# Patient Record
Sex: Female | Born: 1945 | Race: Black or African American | Hispanic: No | Marital: Married | Smoking: Former smoker
Health system: Southern US, Community
[De-identification: ages and names within clinical notes are randomized; demographics above are authoritative.]

## PROBLEM LIST (undated history)

## (undated) DIAGNOSIS — R51 Headache: Secondary | ICD-10-CM

## (undated) DIAGNOSIS — Z87440 Personal history of urinary (tract) infections: Secondary | ICD-10-CM

## (undated) DIAGNOSIS — G4733 Obstructive sleep apnea (adult) (pediatric): Secondary | ICD-10-CM

## (undated) DIAGNOSIS — M1990 Unspecified osteoarthritis, unspecified site: Secondary | ICD-10-CM

## (undated) DIAGNOSIS — K219 Gastro-esophageal reflux disease without esophagitis: Secondary | ICD-10-CM

## (undated) DIAGNOSIS — I10 Essential (primary) hypertension: Secondary | ICD-10-CM

## (undated) DIAGNOSIS — Z8619 Personal history of other infectious and parasitic diseases: Secondary | ICD-10-CM

## (undated) DIAGNOSIS — B379 Candidiasis, unspecified: Secondary | ICD-10-CM

## (undated) HISTORY — DX: Unspecified osteoarthritis, unspecified site: M19.90

## (undated) HISTORY — PX: CARPAL TUNNEL RELEASE: SHX101

## (undated) HISTORY — PX: TOTAL SHOULDER ARTHROPLASTY: SHX126

## (undated) HISTORY — DX: Gastro-esophageal reflux disease without esophagitis: K21.9

## (undated) HISTORY — DX: Personal history of other infectious and parasitic diseases: Z86.19

## (undated) HISTORY — DX: Headache: R51

## (undated) HISTORY — DX: Obstructive sleep apnea (adult) (pediatric): G47.33

## (undated) HISTORY — DX: Personal history of urinary (tract) infections: Z87.440

## (undated) HISTORY — PX: BUNIONECTOMY: SHX129

## (undated) HISTORY — DX: Candidiasis, unspecified: B37.9

## (undated) HISTORY — DX: Essential (primary) hypertension: I10

## (undated) NOTE — *Deleted (*Deleted)
Initial Nutrition Assessment  DOCUMENTATION CODES:   Obesity unspecified  INTERVENTION:  Recommend diet liberalization  Ensure Enlive po BID, each supplement provides 350 kcal and 20 grams of protein  MVI daily   NUTRITION DIAGNOSIS:   Increased nutrient needs related to catabolic illness (COVID-19) as evidenced by estimated needs.    GOAL:   Patient will meet greater than or equal to 90% of their needs    MONITOR:   PO intake, Supplement acceptance, Labs, Weight trends, I & O's  REASON FOR ASSESSMENT:   LOS    ASSESSMENT:   Pt with a PMH significant for asthma, HTN, OSA, hypothyroidism and L thyroid nodule admitted with acute hypoxemic respiratory failure due to COVID-19.  Reviewed wt history. No significant wt changes noted.   PO Intake: 50% x 1 recorded meal  Labs: Corrected Ca 12.2 (H) Medications: vitamin c, vitamin D3, solu-medrol, protonix, zinc sulfate  Diet Order:   Diet Order            Diet Heart Room service appropriate? Yes; Fluid consistency: Thin  Diet effective now                 EDUCATION NEEDS:   Not appropriate for education at this time  Skin:  Skin Assessment: Reviewed RN Assessment  Last BM:  10/17 type 3  Height:   Ht Readings from Last 1 Encounters:  03/24/20 5\' 3"  (1.6 m)    Weight:   Wt Readings from Last 1 Encounters:  03/24/20 102.1 kg    Ideal Body Weight:     BMI:  Body mass index is 39.86 kg/m.  Estimated Nutritional Needs:   Kcal:  2150-2350  Protein:  115-135 grams  Fluid:  >2L/d    ***

---

## 2010-10-09 ENCOUNTER — Encounter: Payer: Self-pay | Admitting: Internal Medicine

## 2010-10-10 ENCOUNTER — Encounter: Payer: Self-pay | Admitting: Internal Medicine

## 2010-10-10 ENCOUNTER — Ambulatory Visit (INDEPENDENT_AMBULATORY_CARE_PROVIDER_SITE_OTHER): Payer: Medicare Other | Admitting: Internal Medicine

## 2010-10-10 VITALS — BP 136/84 | HR 63 | Temp 98.1°F | Ht 66.0 in | Wt 242.8 lb

## 2010-10-10 DIAGNOSIS — R0600 Dyspnea, unspecified: Secondary | ICD-10-CM

## 2010-10-10 DIAGNOSIS — R0609 Other forms of dyspnea: Secondary | ICD-10-CM

## 2010-10-10 NOTE — Progress Notes (Signed)
  Subjective:    Patient ID: Amanda Ross, female    DOB: 06-Apr-1946, 65 y.o.   MRN: [id number]  HPI  Primary = [name].  65 yowf quit smoking 2007 no resp problems or need for medication while living in [place] @ wt 200lb   10/10/2010 ov / Wert  Initial pulmonary office eval cc sob since 2008 daily doe x 2 blocks and also feels lump in throat when lies down and some better when lying on side assoc with chronic nasal congestion but no purulent secretions. Pt denies any significant sore throat, dysphagia, itching, sneezing,  nasal congestion or excess/ purulent secretions,  fever, chills, sweats, unintended wt loss, pleuritic or exertional cp, hempoptysis, orthopnea pnd or leg swelling.    Also denies any obvious fluctuation of symptoms with weather or environmental changes or other aggravating or alleviating factors.  No better on symbicort 80    Review of Systems  Constitutional: Negative for fever, chills and unexpected weight change.  HENT: Positive for congestion, trouble swallowing and dental problem. Negative for ear pain, nosebleeds, sore throat, rhinorrhea, sneezing, voice change, postnasal drip and sinus pressure.   Eyes: Negative for visual disturbance.  Respiratory: Positive for shortness of breath. Negative for cough and choking.   Cardiovascular: Positive for leg swelling. Negative for chest pain.  Gastrointestinal: Negative for vomiting, abdominal pain and diarrhea.  Genitourinary: Negative for difficulty urinating.  Musculoskeletal: Negative for arthralgias.  Skin: Negative for rash.  Neurological: Negative for tremors, syncope and headaches.  Hematological: Does not bruise/bleed easily.       Objective:   Physical Exam Obese bf mild hoarse nad  Wt  243 10/10/2010  HEENT mild turbinate edema.  Oropharynx no thrush or excess pnd or cobblestoning.  No JVD or cervical adenopathy. Mild accessory muscle hypertrophy. Trachea midline, nl thryroid. Chest was hyperinflated by  percussion with diminished breath sounds and moderate increased exp time without wheeze. Hoover sign positive at mid inspiration. Regular rate and rhythm without murmur gallop or rub or increase P2 or edema.  Abd: no hsm, nl excursion. Ext warm without cyanosis or clubbing.         Assessment & Plan:

## 2010-10-10 NOTE — Patient Instructions (Signed)
Work on Musician technique:  relax and gently blow all the way out then take a nice smooth deep breath back in, triggering the inhaler at same time you start breathing in.  Hold for up to 5 seconds if you can.  Rinse and gargle with water when done   If your mouth or throat starts to bother you,   I suggest you time the inhaler to your dental care and after using the inhaler(s) brush teeth and tongue with a baking soda containing toothpaste and when you rinse this out, gargle with it first to see if this helps your mouth and throat.     Prilosec 20mg  Take 30-60 min before first meal of the day and Pepcid 20 mg one at bedtime   GERD (REFLUX)  is an extremely common cause of respiratory symptoms, many times with no significant heartburn at all.    It can be treated with medication, but also with lifestyle changes including avoidance of late meals, excessive alcohol, smoking cessation, and avoid fatty foods, chocolate, peppermint, colas, red wine, and acidic juices such as orange juice.  NO MINT OR MENTHOL PRODUCTS SO NO COUGH DROPS  USE SUGARLESS CANDY INSTEAD (jolley ranchers or Stover's)  NO OIL BASED VITAMINS   Please schedule a follow up office visit in 4 weeks, sooner if needed with PFt's on return

## 2010-10-11 ENCOUNTER — Encounter: Payer: Self-pay | Admitting: Internal Medicine

## 2010-10-11 DIAGNOSIS — R0600 Dyspnea, unspecified: Secondary | ICD-10-CM | POA: Insufficient documentation

## 2010-10-11 NOTE — Assessment & Plan Note (Signed)
Not clear this is an airway problem but if it is airway related it fits the criteria for difficult to control  DDX of  difficult airways managment all start with A and  include Adherence, Ace Inhibitors, Acid Reflux, Active Sinus Disease, Alpha 1 Antitripsin deficiency, Anxiety masquerading as Airways dz,  ABPA,  allergy(esp in young), Aspiration (esp in elderly), Adverse effects of DPI,  Active smokers, plus two Bs  = Bronchiectasis and Beta blocker use..and one C= CHF   Adherence here relates to ability to use hfa effectively in case this is asthma. The proper method of use, as well as anticipated side effects, of this metered-dose inhaler are discussed and demonstrated to the patient.  Improved to 50% with coaching  ? Acid reflux related > empiric rx pending pft's on return

## 2010-11-09 ENCOUNTER — Ambulatory Visit: Payer: Medicare Other | Admitting: Internal Medicine

## 2010-12-06 ENCOUNTER — Ambulatory Visit: Payer: Medicare Other | Admitting: Internal Medicine

## 2011-01-04 NOTE — Progress Notes (Unsigned)
  Subjective:    Patient ID: Amanda Ross, female    DOB: Dec 26, 1945, 65 y.o.   MRN: [id number]  HPI  Primary = [name].  65 yowf quit smoking 2007 no resp problems or need for medication while living in [place] @ wt 200lb   10/10/2010 ov / Wert  Initial pulmonary office eval cc sob since 2008 daily doe x 2 blocks and also feels lump in throat when lies down and some better when lying on side assoc with chronic nasal congestion but no purulent secretions. Pt denies any significant sore throat, dysphagia, itching, sneezing,  nasal congestion or excess/ purulent secretions,  fever, chills, sweats, unintended wt loss, pleuritic or exertional cp, hempoptysis, orthopnea pnd or leg swelling.    Also denies any obvious fluctuation of symptoms with weather or environmental changes or other aggravating or alleviating factors.  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  Oropharynx no thrush or excess pnd or cobblestoning.  No JVD or cervical adenopathy. Mild accessory muscle hypertrophy. Trachea midline, nl thryroid. Chest was hyperinflated by percussion with diminished breath sounds and moderate increased exp time without wheeze. Hoover sign positive at mid inspiration. Regular rate and rhythm without murmur gallop or rub or increase P2 or edema.  Abd: no hsm, nl excursion. Ext warm without cyanosis or clubbing.         Assessment & Plan:

## 2011-01-05 ENCOUNTER — Ambulatory Visit: Payer: Medicare Other | Admitting: Internal Medicine

## 2011-01-18 ENCOUNTER — Encounter: Payer: Self-pay | Admitting: Podiatry

## 2011-01-31 ENCOUNTER — Ambulatory Visit (INDEPENDENT_AMBULATORY_CARE_PROVIDER_SITE_OTHER): Payer: Medicare Other | Admitting: Internal Medicine

## 2011-01-31 ENCOUNTER — Encounter: Payer: Self-pay | Admitting: Internal Medicine

## 2011-01-31 VITALS — BP 104/80 | HR 77 | Temp 97.9°F | Ht 65.0 in | Wt 242.0 lb

## 2011-01-31 DIAGNOSIS — R0989 Other specified symptoms and signs involving the circulatory and respiratory systems: Secondary | ICD-10-CM

## 2011-01-31 DIAGNOSIS — R0600 Dyspnea, unspecified: Secondary | ICD-10-CM

## 2011-01-31 LAB — PULMONARY FUNCTION TEST

## 2011-01-31 MED ORDER — FAMOTIDINE 20 MG PO TABS: ORAL_TABLET | ORAL | Status: DC

## 2011-01-31 NOTE — Patient Instructions (Signed)
Prilosec 20 mg Take 30-60 min before first meal of the day and Pepcid 20 mg on at bedtime  GERD (REFLUX)  is an extremely common cause of respiratory symptoms, many times with no significant heartburn at all.    It can be treated with medication, but also with lifestyle changes including avoidance of late meals, excessive alcohol, smoking cessation, and avoid fatty foods, chocolate, peppermint, colas, red wine, and acidic juices such as orange juice.  NO MINT OR MENTHOL PRODUCTS SO NO COUGH DROPS  USE SUGARLESS CANDY INSTEAD (jolley ranchers or Stover's)  NO OIL BASED VITAMINS  You do not have lung damage from smoking,  So it's ok just use the symbicort 2 puffs every 12 hours if needed  Your inhaler techique is much better!   If you are satisfied with your treatment plan let your doctor know and he/she can either refill your medications or you can return here when your prescription runs out.     If in any way you are not 100% satisfied,  please tell us.  If 100% better, tell your friends!

## 2011-01-31 NOTE — Progress Notes (Signed)
PFT done today. 

## 2011-01-31 NOTE — Progress Notes (Signed)
  Subjective:    Patient ID: Amanda Ross, female    DOB: 1946/05/23, 65 y.o.   MRN: [id number]  HPI  Primary = Amanda Ross.  65 yowf quit smoking 2007 no resp problems or need for medication while living in [place] @ wt 200lb   10/10/2010 ov / Adessa Primiano  Initial pulmonary office eval cc sob since 2008 daily doe x 2 blocks and also feels lump in throat when lies down and some better when lying on side assoc with chronic nasal congestion but no purulent secretions.  No better on symbicort 80  - imp ? 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  Sleeping ok without nocturnal  or early am exac of resp c/o's or need for noct saba. Pt denies any significant sore throat, dysphagia, itching, sneezing,  nasal congestion or excess/ purulent secretions,  fever, chills, sweats, unintended wt loss, pleuritic or exertional cp, hempoptysis, orthopnea pnd or leg swelling.    Also denies any obvious fluctuation of symptoms with weather or environmental changes or other aggravating or alleviating factors.         Objective:   Physical Exam Obese bf mild hoarse nad  Wt  243 10/10/2010 > 242 01/31/2011  HEENT mild turbinate edema.  Oropharynx no thrush or excess pnd or cobblestoning.  No JVD or cervical adenopathy. Mild accessory muscle hypertrophy. Trachea midline, nl thryroid. Chest was hyperinflated by percussion with diminished breath sounds and moderate increased exp time without wheeze. Hoover sign positive at mid inspiration. Regular rate and rhythm without murmur gallop or rub or increase P2 or edema.  Abd: no hsm, nl excursion. Ext warm without cyanosis or clubbing.         Assessment & Plan:

## 2011-01-31 NOTE — Assessment & Plan Note (Signed)
Much better on symbicort 80 though has some atypical features and no significant airflow obstruction on pft's  ? Component of reflux? See instructions for specific recommendations which were reviewed directly with the patient who was given a copy with highlighter outlining the key components.   The proper method of use, as well as anticipated side effects, of this metered-dose inhaler are discussed and demonstrated to the patient. Improved to 90% with coaching.   See instructions for specific recommendations which were reviewed directly with the patient who was given a copy with highlighter outlining the key components.

## 2011-02-08 ENCOUNTER — Encounter: Payer: Self-pay | Admitting: Internal Medicine

## 2011-04-10 ENCOUNTER — Other Ambulatory Visit: Payer: Self-pay | Admitting: Internal Medicine

## 2011-04-10 DIAGNOSIS — Z1231 Encounter for screening mammogram for malignant neoplasm of breast: Secondary | ICD-10-CM

## 2011-04-26 LAB — HM COLONOSCOPY

## 2011-05-08 ENCOUNTER — Ambulatory Visit: Payer: Medicare Other

## 2011-06-08 ENCOUNTER — Other Ambulatory Visit (HOSPITAL_BASED_OUTPATIENT_CLINIC_OR_DEPARTMENT_OTHER): Payer: Self-pay | Admitting: Rheumatology

## 2011-06-08 DIAGNOSIS — M542 Cervicalgia: Secondary | ICD-10-CM

## 2011-06-25 DIAGNOSIS — J3489 Other specified disorders of nose and nasal sinuses: Secondary | ICD-10-CM | POA: Diagnosis not present

## 2011-06-25 DIAGNOSIS — J0110 Acute frontal sinusitis, unspecified: Secondary | ICD-10-CM | POA: Diagnosis not present

## 2011-06-26 ENCOUNTER — Ambulatory Visit (HOSPITAL_BASED_OUTPATIENT_CLINIC_OR_DEPARTMENT_OTHER)
Admission: RE | Admit: 2011-06-26 | Discharge: 2011-06-26 | Disposition: A | Discharge status: Home / Self Care | Payer: Medicare Other | Source: Ambulatory Visit | Attending: Rheumatology | Admitting: Rheumatology

## 2011-06-26 DIAGNOSIS — M542 Cervicalgia: Secondary | ICD-10-CM | POA: Insufficient documentation

## 2011-06-26 DIAGNOSIS — R209 Unspecified disturbances of skin sensation: Secondary | ICD-10-CM | POA: Insufficient documentation

## 2011-06-26 DIAGNOSIS — M47812 Spondylosis without myelopathy or radiculopathy, cervical region: Secondary | ICD-10-CM | POA: Insufficient documentation

## 2011-06-26 DIAGNOSIS — M5020 Other cervical disc displacement, unspecified cervical region: Secondary | ICD-10-CM | POA: Insufficient documentation

## 2011-06-26 DIAGNOSIS — M4800 Spinal stenosis, site unspecified: Secondary | ICD-10-CM | POA: Diagnosis not present

## 2011-06-26 DIAGNOSIS — M5030 Other cervical disc degeneration, unspecified cervical region: Secondary | ICD-10-CM | POA: Insufficient documentation

## 2011-06-26 DIAGNOSIS — E0789 Other specified disorders of thyroid: Secondary | ICD-10-CM | POA: Diagnosis not present

## 2011-06-26 DIAGNOSIS — R51 Headache: Secondary | ICD-10-CM | POA: Insufficient documentation

## 2011-08-09 DIAGNOSIS — Z79899 Other long term (current) drug therapy: Secondary | ICD-10-CM | POA: Diagnosis not present

## 2011-08-09 DIAGNOSIS — R21 Rash and other nonspecific skin eruption: Secondary | ICD-10-CM | POA: Diagnosis not present

## 2011-08-09 DIAGNOSIS — I10 Essential (primary) hypertension: Secondary | ICD-10-CM | POA: Diagnosis not present

## 2011-08-09 DIAGNOSIS — N951 Menopausal and female climacteric states: Secondary | ICD-10-CM | POA: Diagnosis not present

## 2011-09-26 DIAGNOSIS — R197 Diarrhea, unspecified: Secondary | ICD-10-CM | POA: Diagnosis not present

## 2011-09-26 DIAGNOSIS — K5289 Other specified noninfective gastroenteritis and colitis: Secondary | ICD-10-CM | POA: Diagnosis not present

## 2011-09-26 DIAGNOSIS — K521 Toxic gastroenteritis and colitis: Secondary | ICD-10-CM | POA: Diagnosis not present

## 2011-09-26 DIAGNOSIS — I10 Essential (primary) hypertension: Secondary | ICD-10-CM | POA: Diagnosis not present

## 2011-09-26 DIAGNOSIS — Z79899 Other long term (current) drug therapy: Secondary | ICD-10-CM | POA: Diagnosis not present

## 2011-10-09 DIAGNOSIS — M76899 Other specified enthesopathies of unspecified lower limb, excluding foot: Secondary | ICD-10-CM | POA: Diagnosis not present

## 2011-10-09 DIAGNOSIS — M1710 Unilateral primary osteoarthritis, unspecified knee: Secondary | ICD-10-CM | POA: Diagnosis not present

## 2011-10-09 DIAGNOSIS — M542 Cervicalgia: Secondary | ICD-10-CM | POA: Diagnosis not present

## 2011-10-16 DIAGNOSIS — R5381 Other malaise: Secondary | ICD-10-CM | POA: Diagnosis not present

## 2011-10-16 DIAGNOSIS — E041 Nontoxic single thyroid nodule: Secondary | ICD-10-CM | POA: Diagnosis not present

## 2011-10-17 ENCOUNTER — Other Ambulatory Visit: Payer: Self-pay | Admitting: Endocrinology

## 2011-10-17 DIAGNOSIS — E041 Nontoxic single thyroid nodule: Secondary | ICD-10-CM

## 2011-11-19 ENCOUNTER — Encounter: Payer: Self-pay | Admitting: Obstetrics and Gynecology

## 2011-12-12 ENCOUNTER — Emergency Department (HOSPITAL_COMMUNITY)
Admission: EM | Admit: 2011-12-12 | Discharge: 2011-12-12 | Disposition: A | Discharge status: Home / Self Care | Payer: Medicare Other | Attending: Emergency Medicine | Admitting: Emergency Medicine

## 2011-12-12 ENCOUNTER — Encounter (HOSPITAL_COMMUNITY): Payer: Self-pay | Admitting: *Deleted

## 2011-12-12 DIAGNOSIS — Z87891 Personal history of nicotine dependence: Secondary | ICD-10-CM | POA: Diagnosis not present

## 2011-12-12 DIAGNOSIS — K219 Gastro-esophageal reflux disease without esophagitis: Secondary | ICD-10-CM | POA: Insufficient documentation

## 2011-12-12 DIAGNOSIS — I10 Essential (primary) hypertension: Secondary | ICD-10-CM | POA: Insufficient documentation

## 2011-12-12 DIAGNOSIS — L259 Unspecified contact dermatitis, unspecified cause: Secondary | ICD-10-CM | POA: Insufficient documentation

## 2011-12-12 DIAGNOSIS — G4733 Obstructive sleep apnea (adult) (pediatric): Secondary | ICD-10-CM | POA: Diagnosis not present

## 2011-12-12 MED ORDER — TRIAMCINOLONE ACETONIDE 0.1 % EX CREA: TOPICAL_CREAM | Freq: Two times a day (BID) | CUTANEOUS | Status: AC

## 2011-12-12 NOTE — ED Notes (Signed)
PA, Tia informed of elevated BP/HR-pt has not taken am meds, will do so when she gets home and recheck BP as well as following up with PCP

## 2011-12-12 NOTE — ED Provider Notes (Signed)
Medical screening examination/treatment/procedure(s) were performed by non-physician practitioner and as supervising physician I was immediately available for consultation/collaboration.  Flint Melter, MD 12/12/11 [phone]

## 2011-12-12 NOTE — ED Notes (Signed)
Pt presents with rash and neck red and raised, itchy

## 2011-12-12 NOTE — ED Provider Notes (Signed)
History     CSN: [id number]  Arrival date & time 12/12/11  0803   First MD Initiated Contact with Patient 12/12/11 [phone]      Chief Complaint  Patient presents with  . Rash    (Consider location/radiation/quality/duration/timing/severity/associated sxs/prior treatment) Patient is a 66 y.o. female presenting with rash. The history is provided by the patient. No language interpreter was used.  Rash  This is a recurrent problem. The current episode started more than 2 days ago. The problem has been gradually worsening. 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 There has been no fever. The rash is present on the face, right arm, right wrist, right hand, left wrist, left arm and left hand. Associated symptoms include itching. She has tried antihistamines for the symptoms. The treatment provided mild relief. Risk factors include new environmental exposures.  Patient states that she has had symptoms like this before 2 years ago, and visited a physician who could not identify the rash, but gave her a cream (name unknown) with gradual resolution.Denies constitutional symptoms. Denies NVD.Denies changes in lotion, soap, detergent, new medications, new food, or contacts with similar symptoms. Reports a transient sore throat for 2-3 days that resolved spontaneously.  Past Medical History  Diagnosis Date  . GERD (gastroesophageal reflux disease)   . Hypertension   . Asthma   . OSA (obstructive sleep apnea)     Past Surgical History  Procedure Date  . Bunionectomy     Family History  Problem Relation Age of Onset  . Asthma Daughter   . Heart disease Mother     CHF  . Heart disease Brother   . Deep vein thrombosis Mother   . Rheum arthritis Mother   . Rheum arthritis Sister     History  Substance Use Topics  . Smoking status: Former Smoker -- 1.0 packs/day for 40 years    Types: Cigarettes    Quit date: 06/18/2005  . Smokeless tobacco: Never  Used  . Alcohol Use: No    OB History    Grav Para Term Preterm Abortions TAB SAB Ect Mult Living                  Review of Systems  Constitutional: Negative for fever and chills.  Skin: Positive for itching and rash.    Allergies  Penicillins  Home Medications   Current Outpatient Rx  Name Route Sig Dispense Refill  . AMLODIPINE BESYLATE 5 MG PO TABS Oral Take 5 mg by mouth daily.     Marland Kitchen VITAMIN C 500 MG PO TABS Oral Take 500 mg by mouth daily.     . ASPIRIN 81 MG PO TABS Oral Take 81 mg by mouth daily.    Marland Kitchen VITAMIN D 1000 UNITS PO CAPS Oral Take 1,000 Units by mouth daily.     . B-12 2500 MCG PO TABS Oral Take 2,500 mcg by mouth daily.    Marland Kitchen LORATADINE 10 MG PO TABS Oral Take 10 mg by mouth daily.    Marland Kitchen MAGNESIUM GLUCONATE 500 MG PO TABS Oral Take 500 mg by mouth daily.    Marland Kitchen METAXALONE 800 MG PO TABS Oral Take 800 mg by mouth every 8 (eight) hours as needed. Muscle spasms    . METOPROLOL TARTRATE 25 MG PO TABS Oral Take 12.5 mg by mouth 2 (two) times daily.     Marland Kitchen OMEPRAZOLE 20 MG PO CPDR Oral Take 20 mg by mouth daily.  BP 155/77  Pulse 86  Temp 98.1 F (36.7 C) (Oral)  Resp 24  SpO2 94%  Physical Exam  Constitutional: She appears well-developed and well-nourished.  HENT:  Head: Normocephalic and atraumatic.       No oral mucosal involvement.  Neck: Neck supple.  Cardiovascular: Normal rate, regular rhythm and normal heart sounds.   Pulmonary/Chest: Effort normal and breath sounds normal.  Lymphadenopathy:    She has cervical adenopathy.  Skin: Skin is warm. Rash noted.       ED Course  Procedures (including critical care time)   Labs Reviewed - No data to display No results found.   1. Contact dermatitis       MDM   Patient seen and examined, and found in no acute distress. Rash seems to be consistent with contact dermatitis. Patient does not have any respiratory distress, angioedema, fever, mucosal involvement, or nuchal  rigidity.       Pixie Casino, PA-C 12/12/11 1006

## 2011-12-12 NOTE — Discharge Instructions (Signed)
Apply cream 2 times daily. Watch for symptoms worsening, fever, shortness of breath, swelling of lips, tongue, throat. Follow up with your doctor as needed or if not feeling better. Contact Dermatitis Contact dermatitis is a rash that happens when something touches the skin. You touched something that irritates your skin, or you have allergies to something you touched. HOME CARE   Avoid the thing that caused your rash.   Keep your rash away from hot water, soap, sunlight, chemicals, and other things that might bother it.   Do not scratch your rash.   You can take cool baths to help stop itching.   Only take medicine as told by your doctor.   Keep all doctor visits as told.  GET HELP RIGHT AWAY IF:   Your rash is not better after 3 days.   Your rash gets worse.   Your rash is puffy (swollen), tender, red, sore, or warm.   You have problems with your medicine.  MAKE SURE YOU:   Understand these instructions.   Will watch your condition.   Will get help right away if you are not doing well or get worse.  Document Released: 04/01/2009 Document Revised: 05/24/2011 Document Reviewed: 11/07/2010 [facility] Patient Information 2012 [place], [place].

## 2011-12-28 ENCOUNTER — Ambulatory Visit (INDEPENDENT_AMBULATORY_CARE_PROVIDER_SITE_OTHER): Payer: Medicare Other | Admitting: Obstetrics and Gynecology

## 2011-12-28 ENCOUNTER — Encounter: Payer: Self-pay | Admitting: Obstetrics and Gynecology

## 2011-12-28 VITALS — Resp 14 | Ht 64.0 in | Wt 245.0 lb

## 2011-12-28 DIAGNOSIS — R32 Unspecified urinary incontinence: Secondary | ICD-10-CM | POA: Diagnosis not present

## 2011-12-28 DIAGNOSIS — Z124 Encounter for screening for malignant neoplasm of cervix: Secondary | ICD-10-CM | POA: Diagnosis not present

## 2011-12-28 DIAGNOSIS — R87610 Atypical squamous cells of undetermined significance on cytologic smear of cervix (ASC-US): Secondary | ICD-10-CM | POA: Diagnosis not present

## 2011-12-28 DIAGNOSIS — N898 Other specified noninflammatory disorders of vagina: Secondary | ICD-10-CM | POA: Diagnosis not present

## 2011-12-28 LAB — POCT WET PREP (WET MOUNT)
Bacteria Wet Prep HPF POC: NEGATIVE
pH: 5

## 2011-12-28 NOTE — Progress Notes (Signed)
  Last Pap: several years ago WNL: Yes Regular Periods:no Contraception: PM  Monthly Breast exam:no Tetanus<75yrs:yes Nl.Bladder Function:yes Daily BMs:yes Healthy Diet:yes Calcium:yes Mammogram:yes Date of Mammogram: 2009 Exercise:no Have often Exercise:occ Seatbelt: yes Abuse at home: no Stressful work:no Sigmoid-colonoscopy: 1-2 yrs ago Bone Density: Yes 4-5 yrs ago PCP: Dr. Allyne Gee Change in PMH: new Change in FMH:new  C/o incontinence (mixed).    Filed Vitals:   12/28/11 1553  Resp: 14   ROS: noncontributory  Physical Examination: General appearance - alert, well appearing, and in no distress Neck - supple, no significant adenopathy Chest - clear to auscultation, no wheezes, rales or rhonchi, symmetric air entry Heart - normal rate and regular rhythm Abdomen - soft, nontender, nondistended, no masses or organomegaly Breasts - breasts appear normal, no suspicious masses, no skin or nipple changes or axillary nodes Pelvic - normal external genitalia, vulva, vagina, cervix, uterus and adnexa Back exam - no CVAT Extremities - no edema, redness or tenderness in the calves or thighs  A/P Urine Cx Mammo Sched bladder testing

## 2011-12-30 LAB — URINE CULTURE: Colony Count: 50000

## 2012-01-01 LAB — PAP IG W/ RFLX HPV ASCU

## 2012-01-08 ENCOUNTER — Encounter: Payer: Self-pay | Admitting: Obstetrics and Gynecology

## 2012-01-11 DIAGNOSIS — Z6841 Body Mass Index (BMI) 40.0 and over, adult: Secondary | ICD-10-CM | POA: Diagnosis not present

## 2012-01-11 DIAGNOSIS — R609 Edema, unspecified: Secondary | ICD-10-CM | POA: Diagnosis not present

## 2012-01-11 DIAGNOSIS — Z79899 Other long term (current) drug therapy: Secondary | ICD-10-CM | POA: Diagnosis not present

## 2012-01-23 ENCOUNTER — Ambulatory Visit: Payer: Medicare Other | Admitting: Obstetrics and Gynecology

## 2012-01-23 ENCOUNTER — Encounter: Payer: Medicare Other | Admitting: Obstetrics and Gynecology

## 2012-01-23 NOTE — Progress Notes (Signed)
Pt left without being seen.  Has f/u/ repeat pap appt in about a week.  Will review Lumax results at that visit.

## 2012-02-04 DIAGNOSIS — R609 Edema, unspecified: Secondary | ICD-10-CM | POA: Diagnosis not present

## 2012-02-04 DIAGNOSIS — J449 Chronic obstructive pulmonary disease, unspecified: Secondary | ICD-10-CM | POA: Diagnosis not present

## 2012-02-04 DIAGNOSIS — I10 Essential (primary) hypertension: Secondary | ICD-10-CM | POA: Diagnosis not present

## 2012-02-06 DIAGNOSIS — Z79899 Other long term (current) drug therapy: Secondary | ICD-10-CM | POA: Diagnosis not present

## 2012-02-06 DIAGNOSIS — E782 Mixed hyperlipidemia: Secondary | ICD-10-CM | POA: Diagnosis not present

## 2012-02-11 DIAGNOSIS — R0602 Shortness of breath: Secondary | ICD-10-CM | POA: Diagnosis not present

## 2012-02-11 DIAGNOSIS — J449 Chronic obstructive pulmonary disease, unspecified: Secondary | ICD-10-CM | POA: Diagnosis not present

## 2012-02-13 DIAGNOSIS — M7989 Other specified soft tissue disorders: Secondary | ICD-10-CM | POA: Diagnosis not present

## 2012-02-13 DIAGNOSIS — M79609 Pain in unspecified limb: Secondary | ICD-10-CM | POA: Diagnosis not present

## 2012-02-21 ENCOUNTER — Ambulatory Visit (INDEPENDENT_AMBULATORY_CARE_PROVIDER_SITE_OTHER): Payer: Medicare Other | Admitting: Obstetrics and Gynecology

## 2012-02-21 VITALS — Ht 64.0 in | Wt 247.0 lb

## 2012-02-21 DIAGNOSIS — R87615 Unsatisfactory cytologic smear of cervix: Secondary | ICD-10-CM

## 2012-02-21 DIAGNOSIS — Z124 Encounter for screening for malignant neoplasm of cervix: Secondary | ICD-10-CM

## 2012-03-04 NOTE — Progress Notes (Signed)
Patient was scheduled in error and not seen.

## 2012-03-11 DIAGNOSIS — M1710 Unilateral primary osteoarthritis, unspecified knee: Secondary | ICD-10-CM | POA: Diagnosis not present

## 2012-03-11 DIAGNOSIS — M542 Cervicalgia: Secondary | ICD-10-CM | POA: Diagnosis not present

## 2012-03-11 DIAGNOSIS — M76899 Other specified enthesopathies of unspecified lower limb, excluding foot: Secondary | ICD-10-CM | POA: Diagnosis not present

## 2012-03-31 DIAGNOSIS — Z23 Encounter for immunization: Secondary | ICD-10-CM | POA: Diagnosis not present

## 2012-03-31 DIAGNOSIS — Z79899 Other long term (current) drug therapy: Secondary | ICD-10-CM | POA: Diagnosis not present

## 2012-04-01 DIAGNOSIS — G5600 Carpal tunnel syndrome, unspecified upper limb: Secondary | ICD-10-CM | POA: Diagnosis not present

## 2012-04-02 DIAGNOSIS — N183 Chronic kidney disease, stage 3 unspecified: Secondary | ICD-10-CM | POA: Diagnosis not present

## 2012-04-02 DIAGNOSIS — Z0000 Encounter for general adult medical examination without abnormal findings: Secondary | ICD-10-CM | POA: Diagnosis not present

## 2012-04-02 DIAGNOSIS — R413 Other amnesia: Secondary | ICD-10-CM | POA: Diagnosis not present

## 2012-04-02 DIAGNOSIS — I129 Hypertensive chronic kidney disease with stage 1 through stage 4 chronic kidney disease, or unspecified chronic kidney disease: Secondary | ICD-10-CM | POA: Diagnosis not present

## 2012-04-02 DIAGNOSIS — R7309 Other abnormal glucose: Secondary | ICD-10-CM | POA: Diagnosis not present

## 2012-04-02 DIAGNOSIS — E559 Vitamin D deficiency, unspecified: Secondary | ICD-10-CM | POA: Diagnosis not present

## 2012-04-02 DIAGNOSIS — M542 Cervicalgia: Secondary | ICD-10-CM | POA: Diagnosis not present

## 2012-04-03 ENCOUNTER — Ambulatory Visit
Admission: RE | Admit: 2012-04-03 | Discharge: 2012-04-03 | Disposition: A | Discharge status: Home / Self Care | Payer: Medicare Other | Source: Ambulatory Visit | Attending: Endocrinology | Admitting: Endocrinology

## 2012-04-03 DIAGNOSIS — E041 Nontoxic single thyroid nodule: Secondary | ICD-10-CM

## 2012-04-03 DIAGNOSIS — E0789 Other specified disorders of thyroid: Secondary | ICD-10-CM | POA: Diagnosis not present

## 2012-04-11 DIAGNOSIS — Z23 Encounter for immunization: Secondary | ICD-10-CM | POA: Diagnosis not present

## 2012-04-14 DIAGNOSIS — E041 Nontoxic single thyroid nodule: Secondary | ICD-10-CM | POA: Diagnosis not present

## 2012-04-15 DIAGNOSIS — E041 Nontoxic single thyroid nodule: Secondary | ICD-10-CM | POA: Diagnosis not present

## 2012-04-16 ENCOUNTER — Other Ambulatory Visit: Payer: Self-pay | Admitting: Endocrinology

## 2012-04-16 DIAGNOSIS — E041 Nontoxic single thyroid nodule: Secondary | ICD-10-CM

## 2012-04-23 ENCOUNTER — Other Ambulatory Visit (HOSPITAL_COMMUNITY)
Admission: RE | Admit: 2012-04-23 | Discharge: 2012-04-23 | Disposition: A | Discharge status: Home / Self Care | Payer: Medicare Other | Source: Ambulatory Visit | Attending: Interventional Radiology | Admitting: Interventional Radiology

## 2012-04-23 ENCOUNTER — Ambulatory Visit
Admission: RE | Admit: 2012-04-23 | Discharge: 2012-04-23 | Disposition: A | Discharge status: Home / Self Care | Payer: Medicare Other | Source: Ambulatory Visit | Attending: Endocrinology | Admitting: Endocrinology

## 2012-04-23 DIAGNOSIS — E049 Nontoxic goiter, unspecified: Secondary | ICD-10-CM | POA: Insufficient documentation

## 2012-04-23 DIAGNOSIS — E041 Nontoxic single thyroid nodule: Secondary | ICD-10-CM

## 2012-04-25 DIAGNOSIS — R209 Unspecified disturbances of skin sensation: Secondary | ICD-10-CM | POA: Diagnosis not present

## 2012-04-25 DIAGNOSIS — M25549 Pain in joints of unspecified hand: Secondary | ICD-10-CM | POA: Diagnosis not present

## 2012-04-25 DIAGNOSIS — Z23 Encounter for immunization: Secondary | ICD-10-CM | POA: Diagnosis not present

## 2012-05-14 DIAGNOSIS — H40019 Open angle with borderline findings, low risk, unspecified eye: Secondary | ICD-10-CM | POA: Diagnosis not present

## 2012-05-14 DIAGNOSIS — H35039 Hypertensive retinopathy, unspecified eye: Secondary | ICD-10-CM | POA: Diagnosis not present

## 2012-05-14 DIAGNOSIS — H2510 Age-related nuclear cataract, unspecified eye: Secondary | ICD-10-CM | POA: Diagnosis not present

## 2012-05-14 DIAGNOSIS — H5200 Hypermetropia, unspecified eye: Secondary | ICD-10-CM | POA: Diagnosis not present

## 2012-05-14 DIAGNOSIS — H524 Presbyopia: Secondary | ICD-10-CM | POA: Diagnosis not present

## 2012-05-14 DIAGNOSIS — H04129 Dry eye syndrome of unspecified lacrimal gland: Secondary | ICD-10-CM | POA: Diagnosis not present

## 2012-05-19 DIAGNOSIS — G5600 Carpal tunnel syndrome, unspecified upper limb: Secondary | ICD-10-CM | POA: Diagnosis not present

## 2012-06-24 DIAGNOSIS — H04129 Dry eye syndrome of unspecified lacrimal gland: Secondary | ICD-10-CM | POA: Diagnosis not present

## 2012-06-24 DIAGNOSIS — H40019 Open angle with borderline findings, low risk, unspecified eye: Secondary | ICD-10-CM | POA: Diagnosis not present

## 2012-06-30 DIAGNOSIS — E041 Nontoxic single thyroid nodule: Secondary | ICD-10-CM | POA: Diagnosis not present

## 2012-07-01 DIAGNOSIS — E041 Nontoxic single thyroid nodule: Secondary | ICD-10-CM | POA: Diagnosis not present

## 2012-08-12 DIAGNOSIS — M719 Bursopathy, unspecified: Secondary | ICD-10-CM | POA: Diagnosis not present

## 2012-08-12 DIAGNOSIS — M67919 Unspecified disorder of synovium and tendon, unspecified shoulder: Secondary | ICD-10-CM | POA: Diagnosis not present

## 2012-08-20 DIAGNOSIS — M542 Cervicalgia: Secondary | ICD-10-CM | POA: Diagnosis not present

## 2012-08-20 DIAGNOSIS — M62838 Other muscle spasm: Secondary | ICD-10-CM | POA: Diagnosis not present

## 2012-08-20 DIAGNOSIS — M76899 Other specified enthesopathies of unspecified lower limb, excluding foot: Secondary | ICD-10-CM | POA: Diagnosis not present

## 2012-08-20 DIAGNOSIS — M1710 Unilateral primary osteoarthritis, unspecified knee: Secondary | ICD-10-CM | POA: Diagnosis not present

## 2012-08-25 DIAGNOSIS — I10 Essential (primary) hypertension: Secondary | ICD-10-CM | POA: Diagnosis not present

## 2012-08-25 DIAGNOSIS — R350 Frequency of micturition: Secondary | ICD-10-CM | POA: Diagnosis not present

## 2012-09-29 DIAGNOSIS — Z79899 Other long term (current) drug therapy: Secondary | ICD-10-CM | POA: Diagnosis not present

## 2012-09-29 DIAGNOSIS — I129 Hypertensive chronic kidney disease with stage 1 through stage 4 chronic kidney disease, or unspecified chronic kidney disease: Secondary | ICD-10-CM | POA: Diagnosis not present

## 2012-09-29 DIAGNOSIS — IMO0001 Reserved for inherently not codable concepts without codable children: Secondary | ICD-10-CM | POA: Diagnosis not present

## 2012-09-29 DIAGNOSIS — R21 Rash and other nonspecific skin eruption: Secondary | ICD-10-CM | POA: Diagnosis not present

## 2012-12-01 DIAGNOSIS — M719 Bursopathy, unspecified: Secondary | ICD-10-CM | POA: Diagnosis not present

## 2012-12-02 ENCOUNTER — Other Ambulatory Visit: Payer: Self-pay | Admitting: Orthopaedic Surgery

## 2012-12-02 DIAGNOSIS — M25511 Pain in right shoulder: Secondary | ICD-10-CM

## 2012-12-11 ENCOUNTER — Ambulatory Visit
Admission: RE | Admit: 2012-12-11 | Discharge: 2012-12-11 | Disposition: A | Discharge status: Home / Self Care | Payer: Medicare Other | Source: Ambulatory Visit | Attending: Orthopaedic Surgery | Admitting: Orthopaedic Surgery

## 2012-12-11 DIAGNOSIS — M25511 Pain in right shoulder: Secondary | ICD-10-CM

## 2012-12-11 DIAGNOSIS — S46919A Strain of unspecified muscle, fascia and tendon at shoulder and upper arm level, unspecified arm, initial encounter: Secondary | ICD-10-CM | POA: Diagnosis not present

## 2012-12-11 DIAGNOSIS — S46819A Strain of other muscles, fascia and tendons at shoulder and upper arm level, unspecified arm, initial encounter: Secondary | ICD-10-CM | POA: Diagnosis not present

## 2012-12-17 DIAGNOSIS — M67919 Unspecified disorder of synovium and tendon, unspecified shoulder: Secondary | ICD-10-CM | POA: Diagnosis not present

## 2012-12-17 DIAGNOSIS — M719 Bursopathy, unspecified: Secondary | ICD-10-CM | POA: Diagnosis not present

## 2012-12-18 DIAGNOSIS — M5137 Other intervertebral disc degeneration, lumbosacral region: Secondary | ICD-10-CM | POA: Diagnosis not present

## 2012-12-18 DIAGNOSIS — M5030 Other cervical disc degeneration, unspecified cervical region: Secondary | ICD-10-CM | POA: Diagnosis not present

## 2012-12-18 DIAGNOSIS — M1710 Unilateral primary osteoarthritis, unspecified knee: Secondary | ICD-10-CM | POA: Diagnosis not present

## 2012-12-18 DIAGNOSIS — M76899 Other specified enthesopathies of unspecified lower limb, excluding foot: Secondary | ICD-10-CM | POA: Diagnosis not present

## 2012-12-22 DIAGNOSIS — M67919 Unspecified disorder of synovium and tendon, unspecified shoulder: Secondary | ICD-10-CM | POA: Diagnosis not present

## 2012-12-22 DIAGNOSIS — M1710 Unilateral primary osteoarthritis, unspecified knee: Secondary | ICD-10-CM | POA: Diagnosis not present

## 2012-12-22 DIAGNOSIS — M719 Bursopathy, unspecified: Secondary | ICD-10-CM | POA: Diagnosis not present

## 2013-01-01 DIAGNOSIS — Z79899 Other long term (current) drug therapy: Secondary | ICD-10-CM | POA: Diagnosis not present

## 2013-01-01 DIAGNOSIS — E559 Vitamin D deficiency, unspecified: Secondary | ICD-10-CM | POA: Diagnosis not present

## 2013-02-02 DIAGNOSIS — M67919 Unspecified disorder of synovium and tendon, unspecified shoulder: Secondary | ICD-10-CM | POA: Diagnosis not present

## 2013-02-02 DIAGNOSIS — G4730 Sleep apnea, unspecified: Secondary | ICD-10-CM | POA: Diagnosis not present

## 2013-02-02 DIAGNOSIS — M7520 Bicipital tendinitis, unspecified shoulder: Secondary | ICD-10-CM | POA: Diagnosis not present

## 2013-02-02 DIAGNOSIS — M75120 Complete rotator cuff tear or rupture of unspecified shoulder, not specified as traumatic: Secondary | ICD-10-CM | POA: Diagnosis not present

## 2013-02-02 DIAGNOSIS — M24119 Other articular cartilage disorders, unspecified shoulder: Secondary | ICD-10-CM | POA: Diagnosis not present

## 2013-02-02 DIAGNOSIS — M959 Acquired deformity of musculoskeletal system, unspecified: Secondary | ICD-10-CM | POA: Diagnosis not present

## 2013-02-18 DIAGNOSIS — M76899 Other specified enthesopathies of unspecified lower limb, excluding foot: Secondary | ICD-10-CM | POA: Diagnosis not present

## 2013-03-20 DIAGNOSIS — M25619 Stiffness of unspecified shoulder, not elsewhere classified: Secondary | ICD-10-CM | POA: Diagnosis not present

## 2013-03-20 DIAGNOSIS — M75120 Complete rotator cuff tear or rupture of unspecified shoulder, not specified as traumatic: Secondary | ICD-10-CM | POA: Diagnosis not present

## 2013-03-20 DIAGNOSIS — M6281 Muscle weakness (generalized): Secondary | ICD-10-CM | POA: Diagnosis not present

## 2013-03-23 DIAGNOSIS — M25619 Stiffness of unspecified shoulder, not elsewhere classified: Secondary | ICD-10-CM | POA: Diagnosis not present

## 2013-03-23 DIAGNOSIS — M75120 Complete rotator cuff tear or rupture of unspecified shoulder, not specified as traumatic: Secondary | ICD-10-CM | POA: Diagnosis not present

## 2013-03-23 DIAGNOSIS — M6281 Muscle weakness (generalized): Secondary | ICD-10-CM | POA: Diagnosis not present

## 2013-03-25 DIAGNOSIS — M25619 Stiffness of unspecified shoulder, not elsewhere classified: Secondary | ICD-10-CM | POA: Diagnosis not present

## 2013-03-25 DIAGNOSIS — M6281 Muscle weakness (generalized): Secondary | ICD-10-CM | POA: Diagnosis not present

## 2013-03-25 DIAGNOSIS — M75120 Complete rotator cuff tear or rupture of unspecified shoulder, not specified as traumatic: Secondary | ICD-10-CM | POA: Diagnosis not present

## 2013-03-31 DIAGNOSIS — M25619 Stiffness of unspecified shoulder, not elsewhere classified: Secondary | ICD-10-CM | POA: Diagnosis not present

## 2013-03-31 DIAGNOSIS — M6281 Muscle weakness (generalized): Secondary | ICD-10-CM | POA: Diagnosis not present

## 2013-03-31 DIAGNOSIS — M75120 Complete rotator cuff tear or rupture of unspecified shoulder, not specified as traumatic: Secondary | ICD-10-CM | POA: Diagnosis not present

## 2013-04-03 DIAGNOSIS — Z23 Encounter for immunization: Secondary | ICD-10-CM | POA: Diagnosis not present

## 2013-04-03 DIAGNOSIS — M6281 Muscle weakness (generalized): Secondary | ICD-10-CM | POA: Diagnosis not present

## 2013-04-03 DIAGNOSIS — M25619 Stiffness of unspecified shoulder, not elsewhere classified: Secondary | ICD-10-CM | POA: Diagnosis not present

## 2013-04-03 DIAGNOSIS — M75120 Complete rotator cuff tear or rupture of unspecified shoulder, not specified as traumatic: Secondary | ICD-10-CM | POA: Diagnosis not present

## 2013-04-06 DIAGNOSIS — M25619 Stiffness of unspecified shoulder, not elsewhere classified: Secondary | ICD-10-CM | POA: Diagnosis not present

## 2013-04-06 DIAGNOSIS — M75120 Complete rotator cuff tear or rupture of unspecified shoulder, not specified as traumatic: Secondary | ICD-10-CM | POA: Diagnosis not present

## 2013-04-06 DIAGNOSIS — M6281 Muscle weakness (generalized): Secondary | ICD-10-CM | POA: Diagnosis not present

## 2013-04-10 DIAGNOSIS — M6281 Muscle weakness (generalized): Secondary | ICD-10-CM | POA: Diagnosis not present

## 2013-04-10 DIAGNOSIS — M25619 Stiffness of unspecified shoulder, not elsewhere classified: Secondary | ICD-10-CM | POA: Diagnosis not present

## 2013-04-10 DIAGNOSIS — M75120 Complete rotator cuff tear or rupture of unspecified shoulder, not specified as traumatic: Secondary | ICD-10-CM | POA: Diagnosis not present

## 2013-04-13 DIAGNOSIS — I129 Hypertensive chronic kidney disease with stage 1 through stage 4 chronic kidney disease, or unspecified chronic kidney disease: Secondary | ICD-10-CM | POA: Diagnosis not present

## 2013-04-13 DIAGNOSIS — I1310 Hypertensive heart and chronic kidney disease without heart failure, with stage 1 through stage 4 chronic kidney disease, or unspecified chronic kidney disease: Secondary | ICD-10-CM | POA: Diagnosis not present

## 2013-04-13 DIAGNOSIS — Z006 Encounter for examination for normal comparison and control in clinical research program: Secondary | ICD-10-CM | POA: Diagnosis not present

## 2013-04-13 DIAGNOSIS — F321 Major depressive disorder, single episode, moderate: Secondary | ICD-10-CM | POA: Diagnosis not present

## 2013-04-13 DIAGNOSIS — G4700 Insomnia, unspecified: Secondary | ICD-10-CM | POA: Diagnosis not present

## 2013-04-13 DIAGNOSIS — Z79899 Other long term (current) drug therapy: Secondary | ICD-10-CM | POA: Diagnosis not present

## 2013-04-13 DIAGNOSIS — Z0000 Encounter for general adult medical examination without abnormal findings: Secondary | ICD-10-CM | POA: Diagnosis not present

## 2013-04-13 DIAGNOSIS — Z0100 Encounter for examination of eyes and vision without abnormal findings: Secondary | ICD-10-CM | POA: Diagnosis not present

## 2013-04-13 DIAGNOSIS — E559 Vitamin D deficiency, unspecified: Secondary | ICD-10-CM | POA: Diagnosis not present

## 2013-04-13 DIAGNOSIS — Z0110 Encounter for examination of ears and hearing without abnormal findings: Secondary | ICD-10-CM | POA: Diagnosis not present

## 2013-04-15 DIAGNOSIS — M75120 Complete rotator cuff tear or rupture of unspecified shoulder, not specified as traumatic: Secondary | ICD-10-CM | POA: Diagnosis not present

## 2013-04-15 DIAGNOSIS — M6281 Muscle weakness (generalized): Secondary | ICD-10-CM | POA: Diagnosis not present

## 2013-04-15 DIAGNOSIS — M25619 Stiffness of unspecified shoulder, not elsewhere classified: Secondary | ICD-10-CM | POA: Diagnosis not present

## 2013-04-20 DIAGNOSIS — M25619 Stiffness of unspecified shoulder, not elsewhere classified: Secondary | ICD-10-CM | POA: Diagnosis not present

## 2013-04-20 DIAGNOSIS — M75120 Complete rotator cuff tear or rupture of unspecified shoulder, not specified as traumatic: Secondary | ICD-10-CM | POA: Diagnosis not present

## 2013-04-20 DIAGNOSIS — M6281 Muscle weakness (generalized): Secondary | ICD-10-CM | POA: Diagnosis not present

## 2013-04-21 DIAGNOSIS — M5137 Other intervertebral disc degeneration, lumbosacral region: Secondary | ICD-10-CM | POA: Diagnosis not present

## 2013-04-21 DIAGNOSIS — M545 Low back pain: Secondary | ICD-10-CM | POA: Diagnosis not present

## 2013-04-22 ENCOUNTER — Other Ambulatory Visit: Payer: Self-pay | Admitting: Orthopaedic Surgery

## 2013-04-22 DIAGNOSIS — M545 Low back pain: Secondary | ICD-10-CM

## 2013-04-24 DIAGNOSIS — M75120 Complete rotator cuff tear or rupture of unspecified shoulder, not specified as traumatic: Secondary | ICD-10-CM | POA: Diagnosis not present

## 2013-04-24 DIAGNOSIS — M6281 Muscle weakness (generalized): Secondary | ICD-10-CM | POA: Diagnosis not present

## 2013-04-24 DIAGNOSIS — M25619 Stiffness of unspecified shoulder, not elsewhere classified: Secondary | ICD-10-CM | POA: Diagnosis not present

## 2013-04-27 DIAGNOSIS — M25619 Stiffness of unspecified shoulder, not elsewhere classified: Secondary | ICD-10-CM | POA: Diagnosis not present

## 2013-04-27 DIAGNOSIS — M75120 Complete rotator cuff tear or rupture of unspecified shoulder, not specified as traumatic: Secondary | ICD-10-CM | POA: Diagnosis not present

## 2013-04-27 DIAGNOSIS — M6281 Muscle weakness (generalized): Secondary | ICD-10-CM | POA: Diagnosis not present

## 2013-05-01 ENCOUNTER — Other Ambulatory Visit (HOSPITAL_COMMUNITY): Payer: Self-pay | Admitting: Internal Medicine

## 2013-05-01 DIAGNOSIS — M25619 Stiffness of unspecified shoulder, not elsewhere classified: Secondary | ICD-10-CM | POA: Diagnosis not present

## 2013-05-01 DIAGNOSIS — M6281 Muscle weakness (generalized): Secondary | ICD-10-CM | POA: Diagnosis not present

## 2013-05-01 DIAGNOSIS — M75120 Complete rotator cuff tear or rupture of unspecified shoulder, not specified as traumatic: Secondary | ICD-10-CM | POA: Diagnosis not present

## 2013-05-02 ENCOUNTER — Ambulatory Visit
Admission: RE | Admit: 2013-05-02 | Discharge: 2013-05-02 | Disposition: A | Discharge status: Home / Self Care | Payer: Medicare Other | Source: Ambulatory Visit | Attending: Orthopaedic Surgery | Admitting: Orthopaedic Surgery

## 2013-05-02 DIAGNOSIS — M5126 Other intervertebral disc displacement, lumbar region: Secondary | ICD-10-CM | POA: Diagnosis not present

## 2013-05-02 DIAGNOSIS — M545 Low back pain: Secondary | ICD-10-CM

## 2013-05-15 ENCOUNTER — Encounter (HOSPITAL_COMMUNITY)
Admission: RE | Admit: 2013-05-15 | Discharge: 2013-05-15 | Disposition: A | Discharge status: Home / Self Care | Payer: Medicare Other | Source: Ambulatory Visit | Attending: Internal Medicine | Admitting: Internal Medicine

## 2013-05-15 ENCOUNTER — Ambulatory Visit (HOSPITAL_COMMUNITY)
Admission: RE | Admit: 2013-05-15 | Discharge: 2013-05-15 | Disposition: A | Discharge status: Home / Self Care | Payer: Medicare Other | Source: Ambulatory Visit | Attending: Internal Medicine | Admitting: Internal Medicine

## 2013-05-15 MED ORDER — TECHNETIUM TC 99M SESTAMIBI - CARDIOLITE
27.0000 | Freq: Once | INTRAVENOUS | Status: AC | PRN
  Administered 2013-05-15: 08:00:00 27 via INTRAVENOUS

## 2013-05-27 DIAGNOSIS — Z13820 Encounter for screening for osteoporosis: Secondary | ICD-10-CM | POA: Diagnosis not present

## 2013-05-27 DIAGNOSIS — G4700 Insomnia, unspecified: Secondary | ICD-10-CM | POA: Diagnosis not present

## 2013-05-27 DIAGNOSIS — M899 Disorder of bone, unspecified: Secondary | ICD-10-CM | POA: Diagnosis not present

## 2013-05-27 DIAGNOSIS — Z87891 Personal history of nicotine dependence: Secondary | ICD-10-CM | POA: Diagnosis not present

## 2013-06-05 DIAGNOSIS — I1310 Hypertensive heart and chronic kidney disease without heart failure, with stage 1 through stage 4 chronic kidney disease, or unspecified chronic kidney disease: Secondary | ICD-10-CM | POA: Diagnosis not present

## 2013-06-05 DIAGNOSIS — R5381 Other malaise: Secondary | ICD-10-CM | POA: Diagnosis not present

## 2013-06-05 DIAGNOSIS — G4710 Hypersomnia, unspecified: Secondary | ICD-10-CM | POA: Diagnosis not present

## 2013-07-06 ENCOUNTER — Other Ambulatory Visit: Payer: Self-pay | Admitting: *Deleted

## 2013-07-06 ENCOUNTER — Other Ambulatory Visit: Payer: Medicare Other

## 2013-07-06 ENCOUNTER — Other Ambulatory Visit (INDEPENDENT_AMBULATORY_CARE_PROVIDER_SITE_OTHER): Payer: Medicare Other

## 2013-07-06 DIAGNOSIS — E041 Nontoxic single thyroid nodule: Secondary | ICD-10-CM | POA: Insufficient documentation

## 2013-07-06 DIAGNOSIS — E039 Hypothyroidism, unspecified: Secondary | ICD-10-CM

## 2013-07-06 LAB — TSH: TSH: 0.69 u[IU]/mL (ref 0.35–5.50)

## 2013-07-06 LAB — T4, FREE: Free T4: 0.92 ng/dL (ref 0.60–1.60)

## 2013-07-07 DIAGNOSIS — M5137 Other intervertebral disc degeneration, lumbosacral region: Secondary | ICD-10-CM | POA: Diagnosis not present

## 2013-07-07 DIAGNOSIS — IMO0002 Reserved for concepts with insufficient information to code with codable children: Secondary | ICD-10-CM | POA: Diagnosis not present

## 2013-07-08 ENCOUNTER — Ambulatory Visit (INDEPENDENT_AMBULATORY_CARE_PROVIDER_SITE_OTHER): Payer: Medicare Other | Admitting: Endocrinology

## 2013-07-08 ENCOUNTER — Ambulatory Visit: Payer: Medicare Other | Admitting: Endocrinology

## 2013-07-08 ENCOUNTER — Encounter: Payer: Self-pay | Admitting: Endocrinology

## 2013-07-08 VITALS — BP 120/72 | HR 73 | Temp 98.3°F | Resp 16 | Ht 64.0 in | Wt 238.4 lb

## 2013-07-08 DIAGNOSIS — E210 Primary hyperparathyroidism: Secondary | ICD-10-CM

## 2013-07-08 DIAGNOSIS — E213 Hyperparathyroidism, unspecified: Secondary | ICD-10-CM

## 2013-07-08 DIAGNOSIS — E041 Nontoxic single thyroid nodule: Secondary | ICD-10-CM | POA: Diagnosis not present

## 2013-07-08 LAB — BASIC METABOLIC PANEL
BUN: 18 mg/dL (ref 6–23)
CO2: 24 mEq/L (ref 19–32)
CREATININE: 1.2 mg/dL (ref 0.4–1.2)
Calcium: 10.3 mg/dL (ref 8.4–10.5)
Chloride: 106 mEq/L (ref 96–112)
GFR: 49.9 mL/min — AB (ref >60.00)
Glucose, Bld: 66 mg/dL — ABNORMAL LOW (ref 70–99)
Potassium: 3.8 mEq/L (ref 3.5–5.1)
Sodium: 142 mEq/L (ref 135–145)

## 2013-07-08 NOTE — Progress Notes (Signed)
Quick Note:  Please let patient know that the calcium result is back to normal and no further treatment needed ______

## 2013-07-08 NOTE — Progress Notes (Addendum)
Patient ID: Amanda Ross, female   DOB: 05-Oct-1945, 68 y.o.   MRN: [id number]   Reason for Appointment:  followup of thyroid and parathyroid problems    History of Present Illness:   1. Thyroid nodule: The patient's thyroid nodule was first discovered in 06/2011 when she was having an MRI of her neck She has had an neck ultrasound exam in 10/14 which showed that the left  thyroid lobe measured 5.0 x 2.2 x 2.2 cm and had a  solid posterior 1.8 x 1.4 x 1.8 cm lesion with internal vascularity but no calcification. Needle biopsy in 04/2012 of this nodule showed a non-neoplastic goiter She is now here for followup. She has had no difficulty with swallowing  Does not feel like she has any choking sensation in her neck or pressure in any position or when lying down. No unusual fatigue.  Lab Results  Component Value Date   TSH 0.69 07/06/2013   Appointment on 07/06/2013  Component Date Value Range Status  . TSH 07/06/2013 0.69  0.35 - 5.50 uIU/mL Final  . Free T4 07/06/2013 0.92  0.60 - 1.60 ng/dL Final    2. Primary hyperparathyroidism:  She was found to have an increased calcium of 11.0 on routine lab work done by her PCP in 10/14. Parathyroid hormone level was increased at 125 done from [place]. She has not had any history of known osteoporosis, fragility fractures of hip, wrist or spine Has no history of urinary calculi or renal dysfunction. Her vitamin D level was normal at 33.4 and serum protein electrophoresis was also normal Parathyroid scan did not show any parathyroid adenoma She is currently taking vitamin D 3, 2000 units daily but no OTC calcium supplements      Medication List       This list is accurate as of: 07/08/13  1:43 PM.  Always use your most recent med list.               aspirin 81 MG tablet  Take 81 mg by mouth daily.     B-12 2500 MCG Tabs  Take 2,500 mcg by mouth daily.     loratadine 10 MG tablet  Commonly known as:  CLARITIN  Take 10 mg by  mouth daily.     magnesium gluconate 500 MG tablet  Commonly known as:  MAGONATE  Take 500 mg by mouth daily.     metaxalone 800 MG tablet  Commonly known as:  SKELAXIN  Take 800 mg by mouth every 8 (eight) hours as needed. Muscle spasms     metoprolol tartrate 25 MG tablet  Commonly known as:  LOPRESSOR  Take 12.5 mg by mouth 2 (two) times daily.     omeprazole 20 MG capsule  Commonly known as:  PRILOSEC  Take 20 mg by mouth daily.     vitamin C 500 MG tablet  Commonly known as:  ASCORBIC ACID  Take 500 mg by mouth daily.     Vitamin D 1000 UNITS capsule  Take 1,000 Units by mouth daily.        Allergies:  Allergies  Allergen Reactions  . Penicillins Hives  . Percocet [Oxycodone-Acetaminophen]     Past Medical History  Diagnosis Date  . GERD (gastroesophageal reflux disease)   . Hypertension   . Asthma   . OSA (obstructive sleep apnea)   . Yeast infection   . Headache(784.0)     Frequently  . H/O varicella   . H/O measles   .  H/O bladder infections     Past Surgical History  Procedure Laterality Date  . Bunionectomy      Family History  Problem Relation Age of Onset  . Asthma Daughter   . Heart disease Mother     CHF  . Heart disease Brother   . Deep vein thrombosis Mother   . Rheum arthritis Mother   . Rheum arthritis Sister     Social History:  reports that she quit smoking about 8 years ago. Her smoking use included Cigarettes. She has a 40 pack-year smoking history. She has never used smokeless tobacco. She reports that she does not drink alcohol or use illicit drugs.   Review of Systems:   She has a history of high blood pressure.             She was told to have borderline Diabetes.A1c last October was 6.2 with glucose 84 History of low back pain              Examination:   BP 120/72  Pulse 73  Temp(Src) 98.3 F (36.8 C)  Resp 16  Ht 5\' 4"  (1.626 m)  Wt 238 lb 6.4 oz (108.138 kg)  BMI 40.90 kg/m2  SpO2 95%   General  Appearance: pleasant,has generalized obesity           Eyes: No abnormal prominence or eyelid swelling.          Neck: The thyroid nodule on the left is just palpable on swallowing, smooth and slightly firm and measured about 1.5-2 cm  right thyroid is not palpable and there is no lymphadenopathy in the neck Deep tendon reflexes are normal No pedal edema  Assessment/Plan:   Benign thyroid nodule with previous biopsy in 04/2012. Clinically this is difficult to palpate and not clear if there is any change in size. Discussed that since it is more than a year since she had an ultrasound will check this again to ensure stability of the nodule and evaluate the rest of the thyroid also. There is no need for suppression treatment. If the nodule is stable the patient can be followed up annually.  Hyperparathyroidism: She is asymptomatic. Since her calcium level is <1 mg above the upper limit of normal she is not a candidate for surgery. Also because of her age and lack of history of symptomatic osteoporosis is also not a candidate for surgery. Will repeat her calcium and forward results to PCP. Discussed with patient in detail he nature of hyperparathyroidism, possible complications and usual natural history. Explained to her the location of the parathyroid glands on the model. Also discussed that if she does need surgery she will have to have full neck exploration and removal of 3-1/2 glands  Total visit time including counseling = 25 minutes  Amanda Ross 07/08/2013  Labs: Calcium normal now  Clinical Support on 07/08/2013  Component Date Value Range Status  . Sodium 07/08/2013 142  135 - 145 mEq/L Final  . Potassium 07/08/2013 3.8  3.5 - 5.1 mEq/L Final  . Chloride 07/08/2013 106  96 - 112 mEq/L Final  . CO2 07/08/2013 24  19 - 32 mEq/L Final  . Glucose, Bld 07/08/2013 66* 70 - 99 mg/dL Final  . BUN 07/08/2013 18  6 - 23 mg/dL Final  . Creatinine, Ser 07/08/2013 1.2  0.4 - 1.2 mg/dL Final  .  Calcium 07/08/2013 10.3  8.4 - 10.5 mg/dL Final  . GFR 07/08/2013 49.90* >60.00 mL/min Final  Appointment on 07/06/2013  Component Date Value Range Status  . TSH 07/06/2013 0.69  0.35 - 5.50 uIU/mL Final  . Free T4 07/06/2013 0.92  0.60 - 1.60 ng/dL Final

## 2013-07-09 DIAGNOSIS — E210 Primary hyperparathyroidism: Secondary | ICD-10-CM | POA: Insufficient documentation

## 2013-07-14 ENCOUNTER — Ambulatory Visit
Admission: RE | Admit: 2013-07-14 | Discharge: 2013-07-14 | Disposition: A | Discharge status: Home / Self Care | Payer: Medicare Other | Source: Ambulatory Visit | Attending: Endocrinology | Admitting: Endocrinology

## 2013-07-14 DIAGNOSIS — E041 Nontoxic single thyroid nodule: Secondary | ICD-10-CM

## 2013-07-15 NOTE — Progress Notes (Signed)
Quick Note:  Please let patient know that thenodule is unchanged and can see her in 1 year ______

## 2013-07-23 DIAGNOSIS — M1710 Unilateral primary osteoarthritis, unspecified knee: Secondary | ICD-10-CM | POA: Diagnosis not present

## 2013-07-23 DIAGNOSIS — M25559 Pain in unspecified hip: Secondary | ICD-10-CM | POA: Diagnosis not present

## 2013-07-23 DIAGNOSIS — M5030 Other cervical disc degeneration, unspecified cervical region: Secondary | ICD-10-CM | POA: Diagnosis not present

## 2013-07-23 DIAGNOSIS — M5137 Other intervertebral disc degeneration, lumbosacral region: Secondary | ICD-10-CM | POA: Diagnosis not present

## 2013-07-29 DIAGNOSIS — M545 Low back pain, unspecified: Secondary | ICD-10-CM | POA: Diagnosis not present

## 2013-07-29 DIAGNOSIS — IMO0002 Reserved for concepts with insufficient information to code with codable children: Secondary | ICD-10-CM | POA: Diagnosis not present

## 2013-07-29 DIAGNOSIS — M5137 Other intervertebral disc degeneration, lumbosacral region: Secondary | ICD-10-CM | POA: Diagnosis not present

## 2013-08-04 DIAGNOSIS — M25559 Pain in unspecified hip: Secondary | ICD-10-CM | POA: Diagnosis not present

## 2013-08-04 DIAGNOSIS — M5137 Other intervertebral disc degeneration, lumbosacral region: Secondary | ICD-10-CM | POA: Diagnosis not present

## 2013-08-04 DIAGNOSIS — IMO0002 Reserved for concepts with insufficient information to code with codable children: Secondary | ICD-10-CM | POA: Diagnosis not present

## 2013-09-01 DIAGNOSIS — M25559 Pain in unspecified hip: Secondary | ICD-10-CM | POA: Diagnosis not present

## 2013-09-01 DIAGNOSIS — M5137 Other intervertebral disc degeneration, lumbosacral region: Secondary | ICD-10-CM | POA: Diagnosis not present

## 2013-09-01 DIAGNOSIS — IMO0002 Reserved for concepts with insufficient information to code with codable children: Secondary | ICD-10-CM | POA: Diagnosis not present

## 2013-09-04 DIAGNOSIS — N182 Chronic kidney disease, stage 2 (mild): Secondary | ICD-10-CM | POA: Diagnosis not present

## 2013-09-04 DIAGNOSIS — I1310 Hypertensive heart and chronic kidney disease without heart failure, with stage 1 through stage 4 chronic kidney disease, or unspecified chronic kidney disease: Secondary | ICD-10-CM | POA: Diagnosis not present

## 2013-09-04 DIAGNOSIS — R51 Headache: Secondary | ICD-10-CM | POA: Diagnosis not present

## 2013-09-04 DIAGNOSIS — Z79899 Other long term (current) drug therapy: Secondary | ICD-10-CM | POA: Diagnosis not present

## 2013-09-30 DIAGNOSIS — I1310 Hypertensive heart and chronic kidney disease without heart failure, with stage 1 through stage 4 chronic kidney disease, or unspecified chronic kidney disease: Secondary | ICD-10-CM | POA: Diagnosis not present

## 2013-09-30 DIAGNOSIS — G4700 Insomnia, unspecified: Secondary | ICD-10-CM | POA: Diagnosis not present

## 2013-09-30 DIAGNOSIS — I517 Cardiomegaly: Secondary | ICD-10-CM | POA: Diagnosis not present

## 2013-09-30 DIAGNOSIS — N182 Chronic kidney disease, stage 2 (mild): Secondary | ICD-10-CM | POA: Diagnosis not present

## 2013-10-13 DIAGNOSIS — M5030 Other cervical disc degeneration, unspecified cervical region: Secondary | ICD-10-CM | POA: Diagnosis not present

## 2013-10-13 DIAGNOSIS — M545 Low back pain, unspecified: Secondary | ICD-10-CM | POA: Diagnosis not present

## 2013-10-13 DIAGNOSIS — M1710 Unilateral primary osteoarthritis, unspecified knee: Secondary | ICD-10-CM | POA: Diagnosis not present

## 2013-10-13 DIAGNOSIS — M5137 Other intervertebral disc degeneration, lumbosacral region: Secondary | ICD-10-CM | POA: Diagnosis not present

## 2014-01-06 ENCOUNTER — Ambulatory Visit: Payer: Medicare Other | Admitting: Endocrinology

## 2014-01-21 DIAGNOSIS — R5381 Other malaise: Secondary | ICD-10-CM | POA: Diagnosis not present

## 2014-01-21 DIAGNOSIS — E559 Vitamin D deficiency, unspecified: Secondary | ICD-10-CM | POA: Diagnosis not present

## 2014-01-21 DIAGNOSIS — Z79899 Other long term (current) drug therapy: Secondary | ICD-10-CM | POA: Diagnosis not present

## 2014-01-21 DIAGNOSIS — R609 Edema, unspecified: Secondary | ICD-10-CM | POA: Diagnosis not present

## 2014-01-21 DIAGNOSIS — R5383 Other fatigue: Secondary | ICD-10-CM | POA: Diagnosis not present

## 2014-01-21 DIAGNOSIS — I1310 Hypertensive heart and chronic kidney disease without heart failure, with stage 1 through stage 4 chronic kidney disease, or unspecified chronic kidney disease: Secondary | ICD-10-CM | POA: Diagnosis not present

## 2014-01-21 DIAGNOSIS — R7309 Other abnormal glucose: Secondary | ICD-10-CM | POA: Diagnosis not present

## 2014-01-21 DIAGNOSIS — N182 Chronic kidney disease, stage 2 (mild): Secondary | ICD-10-CM | POA: Diagnosis not present

## 2014-01-21 DIAGNOSIS — R0602 Shortness of breath: Secondary | ICD-10-CM | POA: Diagnosis not present

## 2014-01-21 DIAGNOSIS — I129 Hypertensive chronic kidney disease with stage 1 through stage 4 chronic kidney disease, or unspecified chronic kidney disease: Secondary | ICD-10-CM | POA: Diagnosis not present

## 2014-01-21 DIAGNOSIS — R079 Chest pain, unspecified: Secondary | ICD-10-CM | POA: Diagnosis not present

## 2014-01-29 ENCOUNTER — Other Ambulatory Visit (INDEPENDENT_AMBULATORY_CARE_PROVIDER_SITE_OTHER): Payer: Medicare Other

## 2014-01-29 ENCOUNTER — Other Ambulatory Visit: Payer: Self-pay | Admitting: *Deleted

## 2014-01-29 DIAGNOSIS — E041 Nontoxic single thyroid nodule: Secondary | ICD-10-CM

## 2014-01-29 LAB — TSH: TSH: 0.17 u[IU]/mL — AB (ref 0.35–4.50)

## 2014-01-29 LAB — T4, FREE: Free T4: 1.21 ng/dL (ref 0.60–1.60)

## 2014-02-03 ENCOUNTER — Ambulatory Visit (INDEPENDENT_AMBULATORY_CARE_PROVIDER_SITE_OTHER): Payer: Medicare Other | Admitting: Endocrinology

## 2014-02-03 ENCOUNTER — Encounter: Payer: Self-pay | Admitting: Endocrinology

## 2014-02-03 VITALS — BP 128/83 | HR 73 | Temp 98.3°F | Resp 16 | Ht 64.0 in | Wt 233.2 lb

## 2014-02-03 DIAGNOSIS — E041 Nontoxic single thyroid nodule: Secondary | ICD-10-CM | POA: Diagnosis not present

## 2014-02-03 DIAGNOSIS — E210 Primary hyperparathyroidism: Secondary | ICD-10-CM

## 2014-02-03 NOTE — Progress Notes (Addendum)
Patient ID: Amanda Ross, female   DOB: Jul 23, 1945, 68 y.o.   MRN: [id number]   Reason for Appointment:  followup of thyroid and parathyroid problems    History of Present Illness:   1. Thyroid nodule: The patient's thyroid nodule was first discovered in 06/2011 when she was having an MRI of her neck She has had an neck ultrasound exam in 10/14 which showed that the left  thyroid lobe measured 5.0 x 2.2 x 2.2 cm and had a  solid posterior 1.8 x 1.4 x 1.8 cm lesion with internal vascularity but no calcification. Needle biopsy in 04/2012 of this nodule showed a non-neoplastic goiter Followup ultrasound was done in 06/2013 which showed Dominant 1.8 cm solid nodule in the posterior left mid gland, previously negative on biopsy, unchanged.  . She has had no difficulty with swallowing  Does not feel like she has any choking sensation in her neck or pressure in any position or when lying down. No unusual fatigue, palpitations, shakiness or sweating. She has not been on any thyroid suppression in the past  Lab Results  Component Value Date   TSH 0.17* 01/29/2014   Appointment on 01/29/2014  Component Date Value Ref Range Status  . TSH 01/29/2014 0.17* 0.35 - 4.50 uIU/mL Final  . Free T4 01/29/2014 1.21  0.60 - 1.60 ng/dL Final    2. Primary hyperparathyroidism:  She was found to have an increased calcium of 11.0 on routine lab work done by her PCP in 10/14. Parathyroid hormone level was increased at 125 done from [place]. She has not had any history of known osteoporosis, fragility fractures of hip, wrist or spine Has no history of urinary calculi or renal dysfunction. Her vitamin D level was normal at 33.4 and serum protein electrophoresis was also normal Parathyroid scan did not show any parathyroid adenoma She is currently taking vitamin D 3, 2000 units daily but no OTC calcium supplements Her last calcium was normal both reports from PCP are not available     Medication List        This list is accurate as of: 02/03/14  1:12 PM.  Always use your most recent med list.               aspirin 81 MG tablet  Take 81 mg by mouth daily.     loratadine 10 MG tablet  Commonly known as:  CLARITIN  Take 10 mg by mouth daily.     losartan-hydrochlorothiazide 50-12.5 MG per tablet  Commonly known as:  HYZAAR  Take 1 tablet by mouth daily.     magnesium gluconate 500 MG tablet  Commonly known as:  MAGONATE  Take 500 mg by mouth daily.     metaxalone 800 MG tablet  Commonly known as:  SKELAXIN  Take 800 mg by mouth every 8 (eight) hours as needed. Muscle spasms     metoprolol tartrate 25 MG tablet  Commonly known as:  LOPRESSOR  Take 12.5 mg by mouth 2 (two) times daily.     omeprazole 20 MG capsule  Commonly known as:  PRILOSEC  Take 20 mg by mouth daily.     traMADol 50 MG tablet  Commonly known as:  ULTRAM  Take 50 mg by mouth every 6 (six) hours as needed.     Vitamin D 1000 UNITS capsule  Take 5,000 Units by mouth daily.        Allergies:  Allergies  Allergen Reactions  . Penicillins Hives  . Percocet [  Oxycodone-Acetaminophen]     Past Medical History  Diagnosis Date  . GERD (gastroesophageal reflux disease)   . Hypertension   . Asthma   . OSA (obstructive sleep apnea)   . Yeast infection   . Headache(784.0)     Frequently  . H/O varicella   . H/O measles   . H/O bladder infections     Past Surgical History  Procedure Laterality Date  . Bunionectomy      Family History  Problem Relation Age of Onset  . Asthma Daughter   . Heart disease Mother     CHF  . Heart disease Brother   . Deep vein thrombosis Mother   . Rheum arthritis Mother   . Rheum arthritis Sister     Social History:  reports that she quit smoking about 8 years ago. Her smoking use included Cigarettes. She has a 40 pack-year smoking history. She has never used smokeless tobacco. She reports that she does not drink alcohol or use illicit drugs.   Review of  Systems:   She has a history of high blood pressure.             She was told to have borderline Diabetes. A1c last October was 6.2 with glucose 84 History of low back pain              Examination:   BP 128/83  Pulse 73  Temp(Src) 98.3 F (36.8 C)  Resp 16  Ht 5\' 4"  (1.626 m)  Wt 233 lb 3.2 oz (105.779 kg)  BMI 40.01 kg/m2  SpO2 92%        Neck:  unable to feel any thyroid enlargement, slight fullness of the left side on swallowing No lymphadenopathy in the neck Deep tendon reflexes are normal, no tremor No pedal edema  Assessment/Plan:   Benign thyroid nodule with previous biopsy in 04/2012. Clinically this is difficult to palpate and not clear if there is any change in size. Discussed that since her TSH is now low she may be developing a hot nodule. However she is not hyperthyroid and clinically doing well. We'll need to have her followup in 6 months again  Hyperparathyroidism: She is asymptomatic. Since her calcium level has been stable and in the upper normal range on her last visit will continue to follow. Will request labs from PCP that were done 2 weeks ago   [facility] 02/03/2014   Addendum:  Labs from PCP dated 01/21/14: TSH 1.49 and vitamin D normal at 40.9 Serum calcium 10.8, mildly increased A1c 6.2

## 2014-04-06 DIAGNOSIS — Z23 Encounter for immunization: Secondary | ICD-10-CM | POA: Diagnosis not present

## 2014-04-19 ENCOUNTER — Encounter: Payer: Self-pay | Admitting: Endocrinology

## 2014-06-02 ENCOUNTER — Other Ambulatory Visit: Payer: Medicare Other

## 2014-06-07 ENCOUNTER — Ambulatory Visit: Payer: Medicare Other | Admitting: Endocrinology

## 2014-06-30 ENCOUNTER — Other Ambulatory Visit (INDEPENDENT_AMBULATORY_CARE_PROVIDER_SITE_OTHER): Payer: Medicare Other

## 2014-06-30 DIAGNOSIS — E210 Primary hyperparathyroidism: Secondary | ICD-10-CM

## 2014-06-30 DIAGNOSIS — E041 Nontoxic single thyroid nodule: Secondary | ICD-10-CM

## 2014-06-30 LAB — TSH: TSH: 0.76 u[IU]/mL (ref 0.35–4.50)

## 2014-06-30 LAB — BASIC METABOLIC PANEL
BUN: 21 mg/dL (ref 6–23)
CO2: 28 meq/L (ref 19–32)
Calcium: 10.6 mg/dL — ABNORMAL HIGH (ref 8.4–10.5)
Chloride: 106 mEq/L (ref 96–112)
Creatinine, Ser: 1.19 mg/dL (ref 0.40–1.20)
GFR: 47.83 mL/min — AB (ref >60.00)
GLUCOSE: 85 mg/dL (ref 70–99)
POTASSIUM: 4 meq/L (ref 3.5–5.1)
Sodium: 141 mEq/L (ref 135–145)

## 2014-06-30 LAB — T3, FREE: T3, Free: 3.2 pg/mL (ref 2.3–4.2)

## 2014-06-30 LAB — T4, FREE: Free T4: 0.92 ng/dL (ref 0.60–1.60)

## 2014-07-05 ENCOUNTER — Encounter: Payer: Self-pay | Admitting: Endocrinology

## 2014-07-05 ENCOUNTER — Ambulatory Visit (INDEPENDENT_AMBULATORY_CARE_PROVIDER_SITE_OTHER): Payer: Medicare Other | Admitting: Endocrinology

## 2014-07-05 VITALS — BP 121/74 | HR 75 | Temp 97.9°F | Resp 16 | Ht 64.0 in | Wt 230.8 lb

## 2014-07-05 DIAGNOSIS — E210 Primary hyperparathyroidism: Secondary | ICD-10-CM

## 2014-07-05 DIAGNOSIS — E041 Nontoxic single thyroid nodule: Secondary | ICD-10-CM

## 2014-07-05 DIAGNOSIS — I10 Essential (primary) hypertension: Secondary | ICD-10-CM | POA: Diagnosis not present

## 2014-07-05 NOTE — Progress Notes (Signed)
Patient ID: Amanda Ross, female   DOB: [birth year], 69 y.o.   MRN: [id number]   Reason for Appointment:  followup of thyroid and parathyroid problems    History of Present Illness:   1. Thyroid nodule: The patient's thyroid nodule was first discovered in 06/2011 when she was having an MRI of her neck She has had an neck ultrasound exam in 10/14 which showed that the left  thyroid lobe measured 5.0 x 2.2 x 2.2 cm and had a  solid posterior 1.8 x 1.4 x 1.8 cm lesion with internal vascularity but no calcification. Needle biopsy in 04/2012 of this nodule showed a non-neoplastic goiter  Followup ultrasound was done in 06/2013 which showed Dominant 1.8 cm solid nodule in the posterior left mid gland, previously negative on biopsy, unchanged.  On her exam the nodule is relatively difficult to palpate  She has had no difficulty with swallowing  Does not feel like she has any choking sensation in her neck or pressure   No unusual fatigue   Although her TSH was low in August it is back to normal now She has not been on any thyroid suppression in the past  Lab Results  Component Value Date   TSH 0.76 06/30/2014   TSH 0.17* 01/29/2014   TSH 0.69 07/06/2013   FREET4 0.92 06/30/2014   FREET4 1.21 01/29/2014   FREET4 0.92 07/06/2013    Appointment on 06/30/2014  Component Date Value Ref Range Status  . TSH 06/30/2014 0.76  0.35 - 4.50 uIU/mL Final  . Free T4 06/30/2014 0.92  0.60 - 1.60 ng/dL Final  . T3, Free 06/30/2014 3.2  2.3 - 4.2 pg/mL Final  . Sodium 06/30/2014 141  135 - 145 mEq/L Final  . Potassium 06/30/2014 4.0  3.5 - 5.1 mEq/L Final  . Chloride 06/30/2014 106  96 - 112 mEq/L Final  . CO2 06/30/2014 28  19 - 32 mEq/L Final  . Glucose, Bld 06/30/2014 85  70 - 99 mg/dL Final  . BUN 06/30/2014 21  6 - 23 mg/dL Final  . Creatinine, Ser 06/30/2014 1.19  0.40 - 1.20 mg/dL Final  . Calcium 06/30/2014 10.6* 8.4 - 10.5 mg/dL Final  . GFR 06/30/2014 47.83* >60.00 mL/min Final    2.  Primary hyperparathyroidism:  She was found to have an increased calcium of 11.0 on routine lab work done by her PCP in 10/14. Parathyroid hormone level was increased at 125 done from [place]. She has not had any history of known osteoporosis, fragility fractures of hip, wrist or spine Has no history of urinary calculi or renal dysfunction. She thinks her last bone density was probably 4 years ago but no records are available.  Her vitamin D level was normal at 33.4 and serum protein electrophoresis was also normal Parathyroid scan did not show any parathyroid adenoma She is currently taking vitamin D 3, 2000 units daily but no OTC calcium supplements   Calcium now is minimally increased at 10.6  Lab Results  Component Value Date   CALCIUM 10.6* 06/30/2014   CALCIUM 10.3 07/08/2013        Medication List       This list is accurate as of: 07/05/14  1:59 PM.  Always use your most recent med list.               aspirin 81 MG tablet  Take 81 mg by mouth daily.     baclofen 10 MG tablet  Commonly known as:  LIORESAL  loratadine 10 MG tablet  Commonly known as:  CLARITIN  Take 10 mg by mouth daily.     losartan-hydrochlorothiazide 50-12.5 MG per tablet  Commonly known as:  HYZAAR  Take 1 tablet by mouth daily.     magnesium gluconate 500 MG tablet  Commonly known as:  MAGONATE  Take 500 mg by mouth daily.     metaxalone 800 MG tablet  Commonly known as:  SKELAXIN  Take 800 mg by mouth every 8 (eight) hours as needed. Muscle spasms     metoprolol tartrate 25 MG tablet  Commonly known as:  LOPRESSOR  Take 12.5 mg by mouth 2 (two) times daily.     omeprazole 20 MG capsule  Commonly known as:  PRILOSEC  Take 20 mg by mouth daily.     temazepam 30 MG capsule  Commonly known as:  RESTORIL     traMADol 50 MG tablet  Commonly known as:  ULTRAM  Take 50 mg by mouth every 6 (six) hours as needed.     traZODone 50 MG tablet  Commonly known as:  DESYREL      Vitamin D 1000 UNITS capsule  Take 2,000 Units by mouth daily.        Allergies:  Allergies  Allergen Reactions  . Penicillins Hives  . Percocet [Oxycodone-Acetaminophen]     Past Medical History  Diagnosis Date  . GERD (gastroesophageal reflux disease)   . Hypertension   . Asthma   . OSA (obstructive sleep apnea)   . Yeast infection   . Headache(784.0)     Frequently  . H/O varicella   . H/O measles   . H/O bladder infections     Past Surgical History  Procedure Laterality Date  . Bunionectomy      Family History  Problem Relation Age of Onset  . Asthma Daughter   . Heart disease Mother     CHF  . Heart disease Brother   . Deep vein thrombosis Mother   . Rheum arthritis Mother   . Rheum arthritis Sister     Social History:  reports that she quit smoking about 9 years ago. Her smoking use included Cigarettes. She has a 40 pack-year smoking history. She has never used smokeless tobacco. She reports that she does not drink alcohol or use illicit drugs.   Review of Systems:   She has a history of high blood pressure.             She was told to have borderline Diabetes. A1c previously was 6.2 with glucose 84 No recent labs available from PCP  She has had a gradual improvement in her weight.  Wt Readings from Last 3 Encounters:  07/05/14 230 lb 12.8 oz (104.69 kg)  02/03/14 233 lb 3.2 oz (105.779 kg)  07/08/13 238 lb 6.4 oz (108.138 kg)         She occasionally takes Ultram and Skelaxin for back pain    Examination:   BP 121/74 mmHg  Pulse 75  Temp(Src) 97.9 F (36.6 C)  Resp 16  Ht 5\' 4"  (1.626 m)  Wt 230 lb 12.8 oz (104.69 kg)  BMI 39.60 kg/m2  SpO2 98%        Neck:  unable to feel any thyroid nodule on either side, slight fullness of the left side on swallowing No lymphadenopathy in the neck Deep tendon reflexes are normal  No pedal edema  Assessment/Plan:   Benign thyroid nodule with previous biopsy in 04/2012. Clinically this  is again  difficult to palpate and not clear if there is any change in size.  However her nodule was stable on follow-up ultrasound in 2015.  TSH is now quite normal..  Discussed previous findings and lab with the patient in detail.    Hyperparathyroidism: This is mild.  She is asymptomatic. Since her calcium level has been stable and currently only minimally increased at 10.6.  Marland Kitchen  Discussed how to monitor the of the parathyroid gland and potential effects on end organs.  She does need to have a bone density test to evaluate any osteopenia and potential need for treatment, this was explained to the patient  Counseling time over 50% of today's 25 minute visit  [facility] 07/05/2014

## 2014-07-21 DIAGNOSIS — M2550 Pain in unspecified joint: Secondary | ICD-10-CM | POA: Diagnosis not present

## 2014-07-21 DIAGNOSIS — M79642 Pain in left hand: Secondary | ICD-10-CM | POA: Diagnosis not present

## 2014-07-21 DIAGNOSIS — M5030 Other cervical disc degeneration, unspecified cervical region: Secondary | ICD-10-CM | POA: Diagnosis not present

## 2014-07-21 DIAGNOSIS — R5383 Other fatigue: Secondary | ICD-10-CM | POA: Diagnosis not present

## 2014-07-21 DIAGNOSIS — M79641 Pain in right hand: Secondary | ICD-10-CM | POA: Diagnosis not present

## 2014-07-21 DIAGNOSIS — M25562 Pain in left knee: Secondary | ICD-10-CM | POA: Diagnosis not present

## 2014-07-21 DIAGNOSIS — Z87898 Personal history of other specified conditions: Secondary | ICD-10-CM | POA: Diagnosis not present

## 2014-07-21 DIAGNOSIS — M25561 Pain in right knee: Secondary | ICD-10-CM | POA: Diagnosis not present

## 2014-09-06 DIAGNOSIS — E782 Mixed hyperlipidemia: Secondary | ICD-10-CM | POA: Diagnosis not present

## 2014-09-06 DIAGNOSIS — I10 Essential (primary) hypertension: Secondary | ICD-10-CM | POA: Diagnosis not present

## 2014-09-06 DIAGNOSIS — I129 Hypertensive chronic kidney disease with stage 1 through stage 4 chronic kidney disease, or unspecified chronic kidney disease: Secondary | ICD-10-CM | POA: Diagnosis not present

## 2014-09-06 DIAGNOSIS — R32 Unspecified urinary incontinence: Secondary | ICD-10-CM | POA: Diagnosis not present

## 2014-09-06 DIAGNOSIS — R5383 Other fatigue: Secondary | ICD-10-CM | POA: Diagnosis not present

## 2014-09-06 DIAGNOSIS — E669 Obesity, unspecified: Secondary | ICD-10-CM | POA: Diagnosis not present

## 2014-09-06 DIAGNOSIS — Z0000 Encounter for general adult medical examination without abnormal findings: Secondary | ICD-10-CM | POA: Diagnosis not present

## 2014-09-06 DIAGNOSIS — R7309 Other abnormal glucose: Secondary | ICD-10-CM | POA: Diagnosis not present

## 2014-09-06 DIAGNOSIS — N182 Chronic kidney disease, stage 2 (mild): Secondary | ICD-10-CM | POA: Diagnosis not present

## 2014-09-13 ENCOUNTER — Other Ambulatory Visit: Payer: Self-pay

## 2014-09-13 DIAGNOSIS — Z1231 Encounter for screening mammogram for malignant neoplasm of breast: Secondary | ICD-10-CM

## 2014-09-21 DIAGNOSIS — M19041 Primary osteoarthritis, right hand: Secondary | ICD-10-CM | POA: Diagnosis not present

## 2014-09-21 DIAGNOSIS — M7071 Other bursitis of hip, right hip: Secondary | ICD-10-CM | POA: Diagnosis not present

## 2014-09-21 DIAGNOSIS — M170 Bilateral primary osteoarthritis of knee: Secondary | ICD-10-CM | POA: Diagnosis not present

## 2014-09-21 DIAGNOSIS — M3500 Sicca syndrome, unspecified: Secondary | ICD-10-CM | POA: Diagnosis not present

## 2014-09-22 DIAGNOSIS — R319 Hematuria, unspecified: Secondary | ICD-10-CM | POA: Diagnosis not present

## 2014-09-24 ENCOUNTER — Ambulatory Visit: Payer: Medicare Other

## 2014-09-24 ENCOUNTER — Other Ambulatory Visit: Payer: Self-pay | Admitting: Nurse Practitioner

## 2014-09-24 DIAGNOSIS — N644 Mastodynia: Secondary | ICD-10-CM

## 2014-09-24 DIAGNOSIS — N63 Unspecified lump in unspecified breast: Secondary | ICD-10-CM

## 2014-09-29 ENCOUNTER — Ambulatory Visit
Admission: RE | Admit: 2014-09-29 | Discharge: 2014-09-29 | Disposition: A | Discharge status: Home / Self Care | Payer: Medicare Other | Source: Ambulatory Visit | Attending: Nurse Practitioner | Admitting: Nurse Practitioner

## 2014-09-29 ENCOUNTER — Other Ambulatory Visit: Payer: Self-pay | Admitting: Nurse Practitioner

## 2014-09-29 DIAGNOSIS — N6001 Solitary cyst of right breast: Secondary | ICD-10-CM | POA: Diagnosis not present

## 2014-09-29 DIAGNOSIS — R921 Mammographic calcification found on diagnostic imaging of breast: Secondary | ICD-10-CM

## 2014-09-29 DIAGNOSIS — N644 Mastodynia: Secondary | ICD-10-CM

## 2014-09-29 DIAGNOSIS — N63 Unspecified lump in unspecified breast: Secondary | ICD-10-CM

## 2014-09-29 DIAGNOSIS — N6459 Other signs and symptoms in breast: Secondary | ICD-10-CM | POA: Diagnosis not present

## 2014-10-06 DIAGNOSIS — R319 Hematuria, unspecified: Secondary | ICD-10-CM | POA: Diagnosis not present

## 2014-10-08 ENCOUNTER — Ambulatory Visit
Admission: RE | Admit: 2014-10-08 | Discharge: 2014-10-08 | Disposition: A | Discharge status: Home / Self Care | Payer: Medicare Other | Source: Ambulatory Visit | Attending: Nurse Practitioner | Admitting: Nurse Practitioner

## 2014-10-08 DIAGNOSIS — R921 Mammographic calcification found on diagnostic imaging of breast: Secondary | ICD-10-CM

## 2014-10-08 DIAGNOSIS — N6489 Other specified disorders of breast: Secondary | ICD-10-CM | POA: Diagnosis not present

## 2014-12-24 ENCOUNTER — Other Ambulatory Visit (INDEPENDENT_AMBULATORY_CARE_PROVIDER_SITE_OTHER): Payer: Medicare Other

## 2014-12-24 DIAGNOSIS — E041 Nontoxic single thyroid nodule: Secondary | ICD-10-CM | POA: Diagnosis not present

## 2014-12-24 DIAGNOSIS — E210 Primary hyperparathyroidism: Secondary | ICD-10-CM | POA: Diagnosis not present

## 2014-12-24 LAB — BASIC METABOLIC PANEL
BUN: 17 mg/dL (ref 6–23)
CO2: 28 meq/L (ref 19–32)
Calcium: 10.4 mg/dL (ref 8.4–10.5)
Chloride: 106 mEq/L (ref 96–112)
Creatinine, Ser: 1.09 mg/dL (ref 0.40–1.20)
GFR: 52.85 mL/min — ABNORMAL LOW (ref >60.00)
GLUCOSE: 100 mg/dL — AB (ref 70–99)
POTASSIUM: 3.5 meq/L (ref 3.5–5.1)
SODIUM: 140 meq/L (ref 135–145)

## 2014-12-24 LAB — TSH: TSH: 0.83 u[IU]/mL (ref 0.35–4.50)

## 2014-12-24 LAB — T4, FREE: Free T4: 0.86 ng/dL (ref 0.60–1.60)

## 2014-12-25 LAB — PARATHYROID HORMONE, INTACT (NO CA): PTH: 50 pg/mL (ref 15–65)

## 2014-12-27 ENCOUNTER — Ambulatory Visit: Payer: Medicare Other | Admitting: Endocrinology

## 2014-12-28 DIAGNOSIS — N3946 Mixed incontinence: Secondary | ICD-10-CM | POA: Diagnosis not present

## 2014-12-28 DIAGNOSIS — R310 Gross hematuria: Secondary | ICD-10-CM | POA: Diagnosis not present

## 2014-12-29 ENCOUNTER — Other Ambulatory Visit: Payer: Medicare Other

## 2015-01-03 ENCOUNTER — Ambulatory Visit: Payer: Medicare Other | Admitting: Endocrinology

## 2015-01-10 DIAGNOSIS — N200 Calculus of kidney: Secondary | ICD-10-CM | POA: Diagnosis not present

## 2015-01-10 DIAGNOSIS — N281 Cyst of kidney, acquired: Secondary | ICD-10-CM | POA: Diagnosis not present

## 2015-01-10 DIAGNOSIS — R310 Gross hematuria: Secondary | ICD-10-CM | POA: Diagnosis not present

## 2015-01-11 ENCOUNTER — Emergency Department (HOSPITAL_COMMUNITY)
Admission: EM | Admit: 2015-01-11 | Discharge: 2015-01-11 | Disposition: A | Discharge status: Home / Self Care | Payer: Medicare Other | Attending: Emergency Medicine | Admitting: Emergency Medicine

## 2015-01-11 ENCOUNTER — Encounter (HOSPITAL_COMMUNITY): Payer: Self-pay | Admitting: Emergency Medicine

## 2015-01-11 DIAGNOSIS — Z87448 Personal history of other diseases of urinary system: Secondary | ICD-10-CM | POA: Diagnosis not present

## 2015-01-11 DIAGNOSIS — Z880 Allergy status to penicillin: Secondary | ICD-10-CM | POA: Insufficient documentation

## 2015-01-11 DIAGNOSIS — T7840XA Allergy, unspecified, initial encounter: Secondary | ICD-10-CM | POA: Insufficient documentation

## 2015-01-11 DIAGNOSIS — I10 Essential (primary) hypertension: Secondary | ICD-10-CM | POA: Diagnosis not present

## 2015-01-11 DIAGNOSIS — Y929 Unspecified place or not applicable: Secondary | ICD-10-CM | POA: Diagnosis not present

## 2015-01-11 DIAGNOSIS — J45909 Unspecified asthma, uncomplicated: Secondary | ICD-10-CM | POA: Insufficient documentation

## 2015-01-11 DIAGNOSIS — K219 Gastro-esophageal reflux disease without esophagitis: Secondary | ICD-10-CM | POA: Diagnosis not present

## 2015-01-11 DIAGNOSIS — Y998 Other external cause status: Secondary | ICD-10-CM | POA: Diagnosis not present

## 2015-01-11 DIAGNOSIS — Z87891 Personal history of nicotine dependence: Secondary | ICD-10-CM | POA: Insufficient documentation

## 2015-01-11 DIAGNOSIS — Z7982 Long term (current) use of aspirin: Secondary | ICD-10-CM | POA: Diagnosis not present

## 2015-01-11 DIAGNOSIS — Y939 Activity, unspecified: Secondary | ICD-10-CM | POA: Diagnosis not present

## 2015-01-11 DIAGNOSIS — X58XXXA Exposure to other specified factors, initial encounter: Secondary | ICD-10-CM | POA: Diagnosis not present

## 2015-01-11 DIAGNOSIS — Z79899 Other long term (current) drug therapy: Secondary | ICD-10-CM | POA: Diagnosis not present

## 2015-01-11 DIAGNOSIS — Z8619 Personal history of other infectious and parasitic diseases: Secondary | ICD-10-CM | POA: Diagnosis not present

## 2015-01-11 DIAGNOSIS — R21 Rash and other nonspecific skin eruption: Secondary | ICD-10-CM | POA: Diagnosis present

## 2015-01-11 MED ORDER — FAMOTIDINE 20 MG PO TABS
40.0000 mg | ORAL_TABLET | Freq: Once | ORAL | Status: AC
  Administered 2015-01-11: 40 mg via ORAL
  Filled 2015-01-11: qty 2

## 2015-01-11 MED ORDER — PREDNISONE 20 MG PO TABS
40.0000 mg | ORAL_TABLET | Freq: Once | ORAL | Status: AC
  Administered 2015-01-11: 40 mg via ORAL
  Filled 2015-01-11: qty 2

## 2015-01-11 MED ORDER — DIPHENHYDRAMINE HCL 25 MG PO CAPS
50.0000 mg | ORAL_CAPSULE | Freq: Once | ORAL | Status: AC
  Administered 2015-01-11: 50 mg via ORAL
  Filled 2015-01-11: qty 2

## 2015-01-11 MED ORDER — PREDNISONE 20 MG PO TABS: ORAL_TABLET | ORAL | Status: DC

## 2015-01-11 NOTE — Discharge Instructions (Signed)

## 2015-01-11 NOTE — ED Notes (Signed)
Pt reports she had a CT scan with contrast yesterday which caused itchy rash last night. Pt reports slight SOB.

## 2015-01-11 NOTE — ED Provider Notes (Signed)
CSN: [id number]     Arrival date & time 01/11/15  0941 History   First MD Initiated Contact with Patient 01/11/15 [phone]     Chief Complaint  Patient presents with  . Allergic Reaction  . Rash     (Consider location/radiation/quality/duration/timing/severity/associated sxs/prior Treatment) Patient is a 69 y.o. female presenting with allergic reaction. The history is provided by the patient.  Allergic Reaction Presenting symptoms: itching, rash and swelling   Presenting symptoms: no wheezing   Severity:  Moderate Prior allergic episodes:  Unable to specify Context comment:  Iv contrast dye Relieved by:  Nothing Worsened by:  Nothing tried Ineffective treatments:  Antihistamines  69 year old female with a chief complaint of allergic reaction. Patient started having an itchy rash about 4 PM yesterday. Patient had an outpatient CT scan done of the abdomen and pelvis yesterday to evaluate for prior hematuria. Patient states this was unremarkable. Patient with diffuse itching has taken Benadryl without relief. Patient with facial swelling as well. Patient able to tolerate by mouth denies any change in voice denies sensation of throat swelling. Patient has some subjective shortness of breath. Denies vomiting diarrhea.  Past Medical History  Diagnosis Date  . GERD (gastroesophageal reflux disease)   . Hypertension   . Asthma   . OSA (obstructive sleep apnea)   . Yeast infection   . Headache(784.0)     Frequently  . H/O varicella   . H/O measles   . H/O bladder infections    Past Surgical History  Procedure Laterality Date  . Bunionectomy     Family History  Problem Relation Age of Onset  . Asthma Daughter   . Heart disease Mother     CHF  . Heart disease Brother   . Deep vein thrombosis Mother   . Rheum arthritis Mother   . Rheum arthritis Sister    History  Substance Use Topics  . Smoking status: Former Smoker -- 1.00 packs/day for 40 years    Types: Cigarettes    Quit  date: 06/18/2005  . Smokeless tobacco: Never Used  . Alcohol Use: No   OB History    Gravida Para Term Preterm AB TAB SAB Ectopic Multiple Living   4 4 3 1      4      Review of Systems  Constitutional: Negative for fever and chills.  HENT: Negative for congestion and rhinorrhea.   Eyes: Negative for redness and visual disturbance.  Respiratory: Negative for shortness of breath and wheezing.   Cardiovascular: Negative for chest pain and palpitations.  Gastrointestinal: Negative for nausea and vomiting.  Genitourinary: Negative for dysuria and urgency.  Musculoskeletal: Negative for myalgias and arthralgias.  Skin: Positive for color change, itching and rash. Negative for pallor and wound.  Neurological: Negative for dizziness and headaches.      Allergies  Iohexol; Penicillins; and Percocet  Home Medications   Prior to Admission medications   Medication Sig Start Date End Date Taking? Authorizing Provider  aspirin 81 MG tablet Take 81 mg by mouth daily.    Historical Provider, MD  baclofen (LIORESAL) 10 MG tablet  06/08/14   Historical Provider, MD  Cholecalciferol (VITAMIN D) 1000 UNITS capsule Take 2,000 Units by mouth daily.     Historical Provider, MD  loratadine (CLARITIN) 10 MG tablet Take 10 mg by mouth daily.    Historical Provider, MD  losartan-hydrochlorothiazide (HYZAAR) 50-12.5 MG per tablet Take 1 tablet by mouth daily.    Historical Provider, MD  magnesium gluconate (  MAGONATE) 500 MG tablet Take 500 mg by mouth daily.    Historical Provider, MD  metaxalone (SKELAXIN) 800 MG tablet Take 800 mg by mouth every 8 (eight) hours as needed. Muscle spasms    Historical Provider, MD  metoprolol tartrate (LOPRESSOR) 25 MG tablet Take 12.5 mg by mouth 2 (two) times daily.     Historical Provider, MD  omeprazole (PRILOSEC) 20 MG capsule Take 20 mg by mouth daily.     Historical Provider, MD  predniSONE (DELTASONE) 20 MG tablet 2 tabs po daily x 4 days 01/11/15   Deno Etienne, DO   temazepam (RESTORIL) 30 MG capsule  05/21/14   Historical Provider, MD  traMADol (ULTRAM) 50 MG tablet Take 50 mg by mouth every 6 (six) hours as needed.    Historical Provider, MD  traZODone (DESYREL) 50 MG tablet  04/21/14   Historical Provider, MD   BP 128/79 mmHg  Pulse 91  Temp(Src) 98.3 F (36.8 C) (Oral)  Resp 18  SpO2 94% Physical Exam  Constitutional: She is oriented to person, place, and time. She appears well-developed and well-nourished. No distress.  HENT:  Head: Normocephalic and atraumatic.  Eyes: EOM are normal. Pupils are equal, round, and reactive to light.  Neck: Normal range of motion. Neck supple.  Cardiovascular: Normal rate and regular rhythm.  Exam reveals no gallop and no friction rub.   No murmur heard. Pulmonary/Chest: Effort normal. She has no wheezes. She has no rales. She exhibits no tenderness.  Abdominal: Soft. She exhibits no distension. There is no tenderness.  Musculoskeletal: She exhibits no edema or tenderness.  Neurological: She is alert and oriented to person, place, and time.  Skin: Skin is warm and dry. Rash noted. She is not diaphoretic. There is erythema.  Diffuse hives.  Diffuse facial swelling. No noted swelling to the oropharynx.  Psychiatric: She has a normal mood and affect. Her behavior is normal.    ED Course  Procedures (including critical care time) Labs Review Labs Reviewed - No data to display  Imaging Review No results found.   EKG Interpretation None      MDM   Final diagnoses:  Allergic reaction, initial encounter    68 year old female with an allergic reaction. Most likely a delayed reaction to IV contrast dye. No noted signs of anaphylaxis at this time. Will treat with by mouth histamine blockers and steroids. Will reassess.   Patient reassessed and feeling much better. Repeat lung exam without wheezes. Swelling with some improvement visually. Left patient continued steroid burst therapy. Follow-up with  PCP. 11:20 AM:  I have discussed the diagnosis/risks/treatment options with the patient and family and believe the pt to be eligible for discharge home to follow-up with PCP. We also discussed returning to the ED immediately if new or worsening sx occur. We discussed the sx which are most concerning (e.g., SOB, throat swelling, syncope) that necessitate immediate return. Medications administered to the patient during their visit and any new prescriptions provided to the patient are listed below.  Medications given during this visit Medications  diphenhydrAMINE (BENADRYL) capsule 50 mg (50 mg Oral Given 01/11/15 1018)  famotidine (PEPCID) tablet 40 mg (40 mg Oral Given 01/11/15 1018)  predniSONE (DELTASONE) tablet 40 mg (40 mg Oral Given 01/11/15 1018)    New Prescriptions   PREDNISONE (DELTASONE) 20 MG TABLET    2 tabs po daily x 4 days     Deno Etienne, DO 01/11/15 1120

## 2015-01-12 ENCOUNTER — Telehealth: Payer: Self-pay | Admitting: *Deleted

## 2015-01-12 NOTE — Telephone Encounter (Signed)
Pharmacy called to clarify predniSONE (DELTASONE) 20 MG tablet 01/11/15 -- Deno Etienne, DO 2 tabs po daily x 4 days sig 6 order.  NCM verified with EDP that sig should be 8.

## 2015-01-17 DIAGNOSIS — R310 Gross hematuria: Secondary | ICD-10-CM | POA: Diagnosis not present

## 2015-01-17 DIAGNOSIS — N200 Calculus of kidney: Secondary | ICD-10-CM | POA: Diagnosis not present

## 2015-01-26 ENCOUNTER — Ambulatory Visit: Payer: Medicare Other | Admitting: Endocrinology

## 2015-02-03 DIAGNOSIS — R310 Gross hematuria: Secondary | ICD-10-CM | POA: Diagnosis not present

## 2015-02-03 DIAGNOSIS — N200 Calculus of kidney: Secondary | ICD-10-CM | POA: Diagnosis not present

## 2015-03-30 DIAGNOSIS — H2513 Age-related nuclear cataract, bilateral: Secondary | ICD-10-CM | POA: Diagnosis not present

## 2015-03-30 DIAGNOSIS — H16103 Unspecified superficial keratitis, bilateral: Secondary | ICD-10-CM | POA: Diagnosis not present

## 2015-04-15 DIAGNOSIS — Z23 Encounter for immunization: Secondary | ICD-10-CM | POA: Diagnosis not present

## 2015-07-20 DIAGNOSIS — M3500 Sicca syndrome, unspecified: Secondary | ICD-10-CM | POA: Diagnosis not present

## 2015-07-20 DIAGNOSIS — M19042 Primary osteoarthritis, left hand: Secondary | ICD-10-CM | POA: Diagnosis not present

## 2015-07-20 DIAGNOSIS — M25562 Pain in left knee: Secondary | ICD-10-CM | POA: Diagnosis not present

## 2015-07-20 DIAGNOSIS — Z79899 Other long term (current) drug therapy: Secondary | ICD-10-CM | POA: Diagnosis not present

## 2015-07-20 DIAGNOSIS — M19041 Primary osteoarthritis, right hand: Secondary | ICD-10-CM | POA: Diagnosis not present

## 2015-07-21 DIAGNOSIS — Z23 Encounter for immunization: Secondary | ICD-10-CM | POA: Diagnosis not present

## 2015-07-28 DIAGNOSIS — H35033 Hypertensive retinopathy, bilateral: Secondary | ICD-10-CM | POA: Diagnosis not present

## 2015-07-28 DIAGNOSIS — H40013 Open angle with borderline findings, low risk, bilateral: Secondary | ICD-10-CM | POA: Diagnosis not present

## 2015-07-28 DIAGNOSIS — H2513 Age-related nuclear cataract, bilateral: Secondary | ICD-10-CM | POA: Diagnosis not present

## 2015-07-28 DIAGNOSIS — H04123 Dry eye syndrome of bilateral lacrimal glands: Secondary | ICD-10-CM | POA: Diagnosis not present

## 2015-08-01 DIAGNOSIS — N183 Chronic kidney disease, stage 3 (moderate): Secondary | ICD-10-CM | POA: Diagnosis not present

## 2015-08-01 DIAGNOSIS — I129 Hypertensive chronic kidney disease with stage 1 through stage 4 chronic kidney disease, or unspecified chronic kidney disease: Secondary | ICD-10-CM | POA: Diagnosis not present

## 2015-08-01 DIAGNOSIS — R7309 Other abnormal glucose: Secondary | ICD-10-CM | POA: Diagnosis not present

## 2015-08-01 DIAGNOSIS — R51 Headache: Secondary | ICD-10-CM | POA: Diagnosis not present

## 2015-08-01 DIAGNOSIS — J309 Allergic rhinitis, unspecified: Secondary | ICD-10-CM | POA: Diagnosis not present

## 2015-10-17 DIAGNOSIS — M19241 Secondary osteoarthritis, right hand: Secondary | ICD-10-CM | POA: Diagnosis not present

## 2015-10-17 DIAGNOSIS — M174 Other bilateral secondary osteoarthritis of knee: Secondary | ICD-10-CM | POA: Diagnosis not present

## 2015-10-17 DIAGNOSIS — Z09 Encounter for follow-up examination after completed treatment for conditions other than malignant neoplasm: Secondary | ICD-10-CM | POA: Diagnosis not present

## 2015-10-17 DIAGNOSIS — Z79899 Other long term (current) drug therapy: Secondary | ICD-10-CM | POA: Diagnosis not present

## 2015-10-17 DIAGNOSIS — M3509 Sicca syndrome with other organ involvement: Secondary | ICD-10-CM | POA: Diagnosis not present

## 2015-10-24 NOTE — Progress Notes (Signed)
This encounter was created in error - please disregard.

## 2016-01-04 DIAGNOSIS — K5900 Constipation, unspecified: Secondary | ICD-10-CM | POA: Diagnosis not present

## 2016-01-04 DIAGNOSIS — I129 Hypertensive chronic kidney disease with stage 1 through stage 4 chronic kidney disease, or unspecified chronic kidney disease: Secondary | ICD-10-CM | POA: Diagnosis not present

## 2016-01-04 DIAGNOSIS — R413 Other amnesia: Secondary | ICD-10-CM | POA: Diagnosis not present

## 2016-01-04 DIAGNOSIS — E559 Vitamin D deficiency, unspecified: Secondary | ICD-10-CM | POA: Diagnosis not present

## 2016-01-04 DIAGNOSIS — Z0000 Encounter for general adult medical examination without abnormal findings: Secondary | ICD-10-CM | POA: Diagnosis not present

## 2016-01-04 DIAGNOSIS — N183 Chronic kidney disease, stage 3 (moderate): Secondary | ICD-10-CM | POA: Diagnosis not present

## 2016-01-04 DIAGNOSIS — R200 Anesthesia of skin: Secondary | ICD-10-CM | POA: Diagnosis not present

## 2016-01-04 DIAGNOSIS — R7309 Other abnormal glucose: Secondary | ICD-10-CM | POA: Diagnosis not present

## 2016-01-09 DIAGNOSIS — Z87891 Personal history of nicotine dependence: Secondary | ICD-10-CM | POA: Diagnosis not present

## 2016-01-09 DIAGNOSIS — R21 Rash and other nonspecific skin eruption: Secondary | ICD-10-CM | POA: Diagnosis not present

## 2016-01-09 DIAGNOSIS — Z0289 Encounter for other administrative examinations: Secondary | ICD-10-CM | POA: Diagnosis not present

## 2016-01-10 ENCOUNTER — Other Ambulatory Visit: Payer: Self-pay | Admitting: Internal Medicine

## 2016-01-10 DIAGNOSIS — Z1231 Encounter for screening mammogram for malignant neoplasm of breast: Secondary | ICD-10-CM

## 2016-01-11 ENCOUNTER — Ambulatory Visit
Admission: RE | Admit: 2016-01-11 | Discharge: 2016-01-11 | Disposition: A | Discharge status: Home / Self Care | Payer: Medicare Other | Source: Ambulatory Visit | Attending: Internal Medicine | Admitting: Internal Medicine

## 2016-01-11 ENCOUNTER — Other Ambulatory Visit: Payer: Self-pay | Admitting: Internal Medicine

## 2016-01-11 DIAGNOSIS — M25562 Pain in left knee: Secondary | ICD-10-CM

## 2016-01-11 DIAGNOSIS — M179 Osteoarthritis of knee, unspecified: Secondary | ICD-10-CM | POA: Diagnosis not present

## 2016-01-19 ENCOUNTER — Ambulatory Visit
Admission: RE | Admit: 2016-01-19 | Discharge: 2016-01-19 | Disposition: A | Discharge status: Home / Self Care | Payer: Medicare Other | Source: Ambulatory Visit | Attending: Internal Medicine | Admitting: Internal Medicine

## 2016-01-19 DIAGNOSIS — Z1231 Encounter for screening mammogram for malignant neoplasm of breast: Secondary | ICD-10-CM | POA: Diagnosis not present

## 2016-01-31 DIAGNOSIS — H1013 Acute atopic conjunctivitis, bilateral: Secondary | ICD-10-CM | POA: Diagnosis not present

## 2016-01-31 DIAGNOSIS — H01003 Unspecified blepharitis right eye, unspecified eyelid: Secondary | ICD-10-CM | POA: Diagnosis not present

## 2016-01-31 DIAGNOSIS — Z79899 Other long term (current) drug therapy: Secondary | ICD-10-CM | POA: Diagnosis not present

## 2016-01-31 DIAGNOSIS — M1990 Unspecified osteoarthritis, unspecified site: Secondary | ICD-10-CM | POA: Diagnosis not present

## 2016-03-19 DIAGNOSIS — N182 Chronic kidney disease, stage 2 (mild): Secondary | ICD-10-CM | POA: Diagnosis not present

## 2016-03-19 DIAGNOSIS — R0600 Dyspnea, unspecified: Secondary | ICD-10-CM | POA: Diagnosis not present

## 2016-03-19 DIAGNOSIS — I129 Hypertensive chronic kidney disease with stage 1 through stage 4 chronic kidney disease, or unspecified chronic kidney disease: Secondary | ICD-10-CM | POA: Diagnosis not present

## 2016-03-19 DIAGNOSIS — R946 Abnormal results of thyroid function studies: Secondary | ICD-10-CM | POA: Diagnosis not present

## 2016-03-20 DIAGNOSIS — D485 Neoplasm of uncertain behavior of skin: Secondary | ICD-10-CM | POA: Diagnosis not present

## 2016-03-20 DIAGNOSIS — B078 Other viral warts: Secondary | ICD-10-CM | POA: Diagnosis not present

## 2016-03-22 DIAGNOSIS — Z23 Encounter for immunization: Secondary | ICD-10-CM | POA: Diagnosis not present

## 2016-04-09 ENCOUNTER — Ambulatory Visit: Payer: Self-pay | Admitting: Rheumatology

## 2016-04-28 ENCOUNTER — Other Ambulatory Visit: Payer: Self-pay | Admitting: Rheumatology

## 2016-04-28 DIAGNOSIS — Z79899 Other long term (current) drug therapy: Secondary | ICD-10-CM

## 2016-04-30 NOTE — Telephone Encounter (Signed)
Last visit and last labs were in May Sent message for appt scheduling Eye exam WNL 01/2016 Ok to refill per Dr Estanislado Pandy

## 2016-05-17 ENCOUNTER — Telehealth: Payer: Self-pay | Admitting: Rheumatology

## 2016-05-17 NOTE — Telephone Encounter (Signed)
LMOM last week for patient to call back to schedule appt. 

## 2016-05-17 NOTE — Telephone Encounter (Signed)
-----   Message from Candice Camp, RT sent at 04/30/2016  3:30 PM EST ----- Patient needs follow up appt with Dr Estanislado Pandy pls call to make appt. Due now

## 2016-05-22 DIAGNOSIS — Z79899 Other long term (current) drug therapy: Secondary | ICD-10-CM | POA: Insufficient documentation

## 2016-05-22 DIAGNOSIS — M3500 Sicca syndrome, unspecified: Secondary | ICD-10-CM | POA: Insufficient documentation

## 2016-05-22 NOTE — Progress Notes (Signed)
Office Visit Note  Patient: Amanda Ross             Date of Birth: [birth year]           MRN: [id number]             PCP: Maximino Greenland, MD Referring: [name], MD Visit Date: 05/23/2016 Occupation: @GUAROCC @    Subjective:  Pain of the Left Hand   History of Present Illness: Amanda Ross is a 70 y.o. female  Last seen 10/17/2015 History of Sjogren's. Has trouble with her dry mouth. Her dry eyes are doing really well. They're not bothering her much at all. She takes Plaquenil 20 mg daily.  She did her labs through Dr. [facility] office. It was done 04/30/2016 We do not see the results of those labs and it may be done through [facility]. They are not in [facility] website.  Her main complaint today is that her left hand at the second MCP joint is slightly tender. She has a history of osteoarthritis of the hands. I offered her Voltaren gel and she is agreeable. She recalls that she did use it in the past and she is out and she needs a refill.    Activities of Daily Living:  Patient reports morning stiffness for 30 minutes.   Patient Denies nocturnal pain.  Difficulty dressing/grooming: Denies Difficulty climbing stairs: Denies Difficulty getting out of chair: Denies Difficulty using hands for taps, buttons, cutlery, and/or writing: Denies   Review of Systems  Constitutional: Negative for fatigue.  HENT: Positive for mouth dryness. Negative for mouth sores.   Eyes: Negative for dryness (doing well w/ dry eyes...very well controlled).  Respiratory: Negative for shortness of breath.   Gastrointestinal: Negative for constipation and diarrhea.  Musculoskeletal: Negative for myalgias and myalgias.  Skin: Negative for sensitivity to sunlight.  Neurological: Positive for dizziness (feels like she will fall forward at times for last 6 monthspp). Negative for light-headedness.  Psychiatric/Behavioral: Negative for decreased concentration and sleep disturbance.     PMFS History:  Patient Active Problem List   Diagnosis Date Noted  . Sjogren's syndrome ([place]) 05/22/2016  . Sicca syndrome, unspecified ([place]) 05/22/2016  . High risk medication use 05/22/2016  . Primary hyperparathyroidism ([place]) 07/09/2013  . Left thyroid nodule 07/06/2013  . Dyspnea 10/11/2010    Past Medical History:  Diagnosis Date  . Asthma   . GERD (gastroesophageal reflux disease)   . H/O bladder infections   . H/O measles   . H/O varicella   . Headache(784.0)    Frequently  . Hypertension   . OSA (obstructive sleep apnea)   . Yeast infection     Family History  Problem Relation Age of Onset  . Asthma Daughter   . Heart disease Mother     CHF  . Heart disease Brother   . Deep vein thrombosis Mother   . Rheum arthritis Mother   . Rheum arthritis Sister    Past Surgical History:  Procedure Laterality Date  . BUNIONECTOMY     Social History   Social History Narrative  . No narrative on file     Objective: Vital Signs: BP 138/82   Pulse 80   Resp 16   Ht 5\' 4"  (1.626 m)   Wt 229 lb (103.9 kg)   BMI 39.31 kg/m    Physical Exam  Constitutional: She is oriented to person, place, and time. She appears well-developed and well-nourished.  HENT:  Head: Normocephalic and atraumatic.  Eyes: EOM are normal. Pupils are equal, round, and reactive to light.  Cardiovascular: Normal rate, regular rhythm and normal heart sounds.  Exam reveals no gallop and no friction rub.   No murmur heard. Pulmonary/Chest: Effort normal and breath sounds normal. She has no wheezes. She has no rales.  Abdominal: Soft. Bowel sounds are normal. She exhibits no distension. There is no tenderness. There is no guarding. No hernia.  Musculoskeletal: Normal range of motion. She exhibits no edema, tenderness or deformity.  Lymphadenopathy:    She has no cervical adenopathy.  Neurological: She is alert and oriented to person, place, and time. Coordination normal.  Skin: Skin is warm  and dry. Capillary refill takes less than 2 seconds. No rash noted.  Psychiatric: She has a normal mood and affect. Her behavior is normal.     Musculoskeletal Exam:  Full range of motion of all joints Grip strength is equal and strong bilaterally except she is unable to do that with the right hand because of second MCP pain Fiber myalgia tender points are all absent  CDAI Exam: No CDAI exam completed.  No synovitis on examination except she does have osteoarthritis of the hands and she is having second MCP pain at this time. It is only tender to palpation. There is no thickening.  Investigation: Findings:  Normal PLQ eye exam 01/31/16 CBC CMP 10/19/15 Creat 0.97  Patient got labs at her family 73 office. November 2017 labs included CBC with differential and CMP with GFR. We looked in [facility] we do not see any results. We suspect that patient labs were done through [facility] and we've asked her to get a copy of them for her records. She will bring him back today if possible. She will need labs again in 5 months from now since she is on Plaquenil.   Imaging: No results found.  Speciality Comments: No specialty comments available.    Procedures:  No procedures performed Allergies: Iohexol; Penicillins; and Percocet [oxycodone-acetaminophen]   Assessment / Plan:     Visit Diagnoses: Bilateral hand pain  Sjogren's syndrome with keratoconjunctivitis sicca (HCC)  Sicca syndrome, unspecified (HCC)  High risk medication use   I will refill the patient's hydroxychloroquine which she takes 20 mg daily. Her Plaquenil eye exam was normal approximately June July 2017 and she has an appointment for 2018 already approximately summer. We discussed the importance of getting the Plaquenil eye exam regularly which she is already doing.  She also is having pain to right second MCP joint and I'm giving her Voltaren gel for this. She has a history of osteoarthritis and she is use  Voltaren before and has had good results.  Her Sjogren's is mostly affecting her mouth. Her eyes are doing very well. We've discussed Tylenol patches to use. [id number] through Dr. [facility] office. She'll get [place] a copy of bring it to our office. We looked up under sterile status website and we could not find the results of those recent labs. The labs included CBC with differential and CMP with GFR.  Pt feels like she will fall forward at times for last 6 months. We discussed the importance of seeing her PCP for proper referral for this. She may need to be referred to a neurologist. He deconditioned and I'll order physical therapy for balance. She can start that while she is waiting for her appointment. Patient understands and is agreeable.   Orders: No orders of the defined types were placed in this encounter.  Meds  ordered this encounter  Medications  . hydroxychloroquine (PLAQUENIL) 200 MG tablet    Sig: Take 1 tablet (200 mg total) by mouth every morning.    Dispense:  90 tablet    Refill:  1    Order Specific Question:   Supervising Provider    Answer:   Bo Merino [[id number]]  . diclofenac sodium (VOLTAREN) 1 % GEL    Sig: Apply 4 g topically 4 (four) times daily. Voltaren Gel 3 grams to 3 large joints upto TID 3 TUBES with 3 refills    Dispense:  3 Tube    Refill:  3    Voltaren Gel 3 grams to 3 large joints upto TID 3 TUBES with 3 refills    Order Specific Question:   Supervising Provider    Answer:   Bo Merino [phone]    Face-to-face time spent with patient was 30 minutes. 50% of time was spent in counseling and coordination of care.  Follow-Up Instructions: Return in about 6 months (around 11/21/2016) for sjogr,plq,ddd c&L,oa hands & kj,.   Eliezer Lofts, PA-C   I examined and evaluated the patient with Eliezer Lofts PA. The plan of care was discussed as noted above.  Bo Merino, MD

## 2016-05-23 ENCOUNTER — Encounter: Payer: Self-pay | Admitting: Rheumatology

## 2016-05-23 ENCOUNTER — Ambulatory Visit (INDEPENDENT_AMBULATORY_CARE_PROVIDER_SITE_OTHER): Payer: Medicare Other | Admitting: Rheumatology

## 2016-05-23 VITALS — BP 138/82 | HR 80 | Resp 16 | Ht 64.0 in | Wt 229.0 lb

## 2016-05-23 DIAGNOSIS — R2689 Other abnormalities of gait and mobility: Secondary | ICD-10-CM | POA: Diagnosis not present

## 2016-05-23 DIAGNOSIS — M79642 Pain in left hand: Secondary | ICD-10-CM

## 2016-05-23 DIAGNOSIS — M3500 Sicca syndrome, unspecified: Secondary | ICD-10-CM | POA: Diagnosis not present

## 2016-05-23 DIAGNOSIS — Z79899 Other long term (current) drug therapy: Secondary | ICD-10-CM

## 2016-05-23 DIAGNOSIS — M79641 Pain in right hand: Secondary | ICD-10-CM

## 2016-05-23 DIAGNOSIS — M3501 Sicca syndrome with keratoconjunctivitis: Secondary | ICD-10-CM

## 2016-05-23 MED ORDER — HYDROXYCHLOROQUINE SULFATE 200 MG PO TABS: 200.0000 mg | ORAL_TABLET | Freq: Every morning | ORAL | 1 refills | Status: DC

## 2016-05-23 MED ORDER — DICLOFENAC SODIUM 1 % TD GEL
4.0000 g | Freq: Four times a day (QID) | TRANSDERMAL | 3 refills | Status: AC
Start: 2016-05-23 — End: 2016-11-19

## 2016-06-20 DIAGNOSIS — B078 Other viral warts: Secondary | ICD-10-CM | POA: Diagnosis not present

## 2016-06-20 DIAGNOSIS — L2084 Intrinsic (allergic) eczema: Secondary | ICD-10-CM | POA: Diagnosis not present

## 2016-08-07 DIAGNOSIS — M1990 Unspecified osteoarthritis, unspecified site: Secondary | ICD-10-CM | POA: Diagnosis not present

## 2016-08-07 DIAGNOSIS — H35033 Hypertensive retinopathy, bilateral: Secondary | ICD-10-CM | POA: Diagnosis not present

## 2016-08-07 DIAGNOSIS — H2513 Age-related nuclear cataract, bilateral: Secondary | ICD-10-CM | POA: Diagnosis not present

## 2016-08-07 DIAGNOSIS — Z79899 Other long term (current) drug therapy: Secondary | ICD-10-CM | POA: Diagnosis not present

## 2016-08-07 DIAGNOSIS — H40013 Open angle with borderline findings, low risk, bilateral: Secondary | ICD-10-CM | POA: Diagnosis not present

## 2016-09-17 DIAGNOSIS — J309 Allergic rhinitis, unspecified: Secondary | ICD-10-CM | POA: Diagnosis not present

## 2016-09-17 DIAGNOSIS — R7309 Other abnormal glucose: Secondary | ICD-10-CM | POA: Diagnosis not present

## 2016-09-17 DIAGNOSIS — I129 Hypertensive chronic kidney disease with stage 1 through stage 4 chronic kidney disease, or unspecified chronic kidney disease: Secondary | ICD-10-CM | POA: Diagnosis not present

## 2016-09-17 DIAGNOSIS — N183 Chronic kidney disease, stage 3 (moderate): Secondary | ICD-10-CM | POA: Diagnosis not present

## 2016-11-23 ENCOUNTER — Encounter: Payer: Self-pay | Admitting: Rheumatology

## 2016-11-23 ENCOUNTER — Ambulatory Visit (INDEPENDENT_AMBULATORY_CARE_PROVIDER_SITE_OTHER): Payer: Medicare Other | Admitting: Rheumatology

## 2016-11-23 ENCOUNTER — Ambulatory Visit: Payer: Medicare Other | Admitting: Rheumatology

## 2016-11-23 VITALS — BP 162/96 | HR 91 | Resp 16 | Ht 63.0 in | Wt 222.0 lb

## 2016-11-23 DIAGNOSIS — M79642 Pain in left hand: Secondary | ICD-10-CM | POA: Diagnosis not present

## 2016-11-23 DIAGNOSIS — M3500 Sicca syndrome, unspecified: Secondary | ICD-10-CM

## 2016-11-23 DIAGNOSIS — M7061 Trochanteric bursitis, right hip: Secondary | ICD-10-CM | POA: Diagnosis not present

## 2016-11-23 DIAGNOSIS — Z79899 Other long term (current) drug therapy: Secondary | ICD-10-CM

## 2016-11-23 DIAGNOSIS — M79641 Pain in right hand: Secondary | ICD-10-CM | POA: Diagnosis not present

## 2016-11-23 DIAGNOSIS — M3501 Sicca syndrome with keratoconjunctivitis: Secondary | ICD-10-CM | POA: Diagnosis not present

## 2016-11-23 DIAGNOSIS — M7062 Trochanteric bursitis, left hip: Secondary | ICD-10-CM | POA: Diagnosis not present

## 2016-11-23 LAB — CBC WITH DIFFERENTIAL/PLATELET
Basophils Absolute: 87 cells/uL (ref 0–200)
Basophils Relative: 1 %
EOS PCT: 5 %
Eosinophils Absolute: 435 cells/uL (ref 15–500)
HCT: 39.4 % (ref 35.0–45.0)
Hemoglobin: 13 g/dL (ref 11.7–15.5)
LYMPHS PCT: 27 %
Lymphs Abs: 2349 cells/uL (ref 850–3900)
MCH: 29.5 pg (ref 27.0–33.0)
MCHC: 33 g/dL (ref 32.0–36.0)
MCV: 89.3 fL (ref 80.0–100.0)
MPV: 11.3 fL (ref 7.5–12.5)
Monocytes Absolute: 522 cells/uL (ref 200–950)
Monocytes Relative: 6 %
Neutro Abs: 5307 cells/uL (ref 1500–7800)
Neutrophils Relative %: 61 %
Platelets: 240 10*3/uL (ref 140–400)
RBC: 4.41 MIL/uL (ref 3.80–5.10)
RDW: 14.5 % (ref 11.0–15.0)
WBC: 8.7 10*3/uL (ref 3.8–10.8)

## 2016-11-23 MED ORDER — DICLOFENAC SODIUM 1 % TD GEL: TRANSDERMAL | 3 refills | Status: DC

## 2016-11-23 NOTE — Progress Notes (Signed)
Office Visit Note  Patient: Amanda Ross             Date of Birth: [birth year]           MRN: [id number]             PCP: [name], MD Referring: [name], MD Visit Date: 11/23/2016 Occupation: @GUAROCC @    Subjective:  Osteoarthritis (FOLLOW UP)   History of Present Illness: Amanda Ross is a 71 y.o. female  Patient is doing well with her Sjogren's. She uses her Plaquenil 200 mg once every morning. Her Plaquenil eye exam was done by Dr. Payton [facility] office approximately August 2017 was normal. On occasion when she has extra dry eyes or dry mouth, she uses over-the-counter products with good relief.  Rash going on and she saw a dermatologist who diagnosed it as atopic dermatitis. Patient wants to know if it's possibly psoriasis. I advised the patient to discuss with the dermatologist. If they truly suspected psoriasis, they usually do a biopsy to confirm.  Patient is also complaining of bilateral hip pain. Unfortunately, patient's blood pressure slightly high today and it is not advisable to do cortisone injection. Therefore she'll use Voltaren gel. If she has ongoing pain and discomfort, and her blood pressures better, she can make an appointment for bilateral greater trochanteric bursitis injections patient will call us if needed for an appointment   Activities of Daily Living:  Patient reports morning stiffness for 15 minutes.   Patient Denies nocturnal pain.  Difficulty dressing/grooming: Denies Difficulty climbing stairs: Denies Difficulty getting out of chair: Denies Difficulty using hands for taps, buttons, cutlery, and/or writing: Denies   Review of Systems  Constitutional: Negative for fatigue.  HENT: Negative for mouth sores and mouth dryness.   Eyes: Negative for dryness.  Respiratory: Negative for shortness of breath.   Gastrointestinal: Negative for constipation and diarrhea.  Musculoskeletal: Negative for myalgias and myalgias.  Skin:  Negative for sensitivity to sunlight.  Psychiatric/Behavioral: Negative for decreased concentration and sleep disturbance.    PMFS History:  Patient Active Problem List   Diagnosis Date Noted  . Sjogren's syndrome ([place]) 05/22/2016  . Sicca syndrome, unspecified ([place]) 05/22/2016  . High risk medication use 05/22/2016  . Primary hyperparathyroidism ([place]) 07/09/2013  . Left thyroid nodule 07/06/2013  . Dyspnea 10/11/2010    Past Medical History:  Diagnosis Date  . Asthma   . GERD (gastroesophageal reflux disease)   . H/O bladder infections   . H/O measles   . H/O varicella   . Headache(784.0)    Frequently  . Hypertension   . OSA (obstructive sleep apnea)   . Osteoarthritis   . Yeast infection     Family History  Problem Relation Age of Onset  . Asthma Daughter   . Heart disease Mother        CHF  . Deep vein thrombosis Mother   . Rheum arthritis Mother   . Heart disease Brother   . Rheum arthritis Sister    Past Surgical History:  Procedure Laterality Date  . BUNIONECTOMY    . CARPAL TUNNEL RELEASE    . TOTAL SHOULDER ARTHROPLASTY     Social History   Social History Narrative  . No narrative on file     Objective: Vital Signs: BP (!) 162/96 (BP Location: Left Arm, Patient Position: Sitting, Cuff Size: Normal)   Pulse 91   Resp 16   Ht 5\' 3"  (1.6 m)   Wt 222 lb (100.7  kg)   BMI 39.33 kg/m    Physical Exam   Musculoskeletal Exam:  Full range of motion of all joints Grip strength is equal and strong bilaterally Fibromyalgia tender points are absent  CDAI Exam: CDAI Homunculus Exam:   Joint Counts:  CDAI Tender Joint count: 0 CDAI Swollen Joint count: 0  Global Assessments:  Patient Global Assessment: 5 Provider Global Assessment: 5  CDAI Calculated Score: 10    Investigation: No additional findings.  No synovitis  Imaging: No results found.  Speciality Comments: No specialty comments available.    Procedures:  No procedures  performed Allergies: Iohexol; Penicillins; and Percocet [oxycodone-acetaminophen]   Assessment / Plan:     Visit Diagnoses: Sjogren's syndrome with keratoconjunctivitis sicca ([place]) - Plan: CBC with Differential/Platelet, COMPLETE METABOLIC PANEL WITH GFR  Sicca syndrome, unspecified (HCC)  High risk medication use - Plan: CBC with Differential/Platelet, COMPLETE METABOLIC PANEL WITH GFR  Bilateral hand pain  Greater trochanteric bursitis of both hips  We offered patient a cortisone injection in the future but unable to do it today because her blood pressure slightly elevated. Patient will make appointment.  Instead of the cortisone injection, we will do Voltaren gel for bilateral hips. Patient was advised how to use it properly.  Return to clinic in 5 months.  Orders: Orders Placed This Encounter  Procedures  . CBC with Differential/Platelet  . COMPLETE METABOLIC PANEL WITH GFR   Meds ordered this encounter  Medications  . diclofenac sodium (VOLTAREN) 1 % GEL    Sig: Voltaren Gel 3 grams to 3 large joints upto TID 3 TUBES with 3 refills    Dispense:  3 Tube    Refill:  3    Voltaren Gel 3 grams to 3 large joints upto TID 3 TUBES with 3 refills    Order Specific Question:   Supervising Provider    Answer:   Lyda Perone    Face-to-face time spent with patient was 30 minutes. 50% of time was spent in counseling and coordination of care.  Follow-Up Instructions: Return in about 5 months (around 04/25/2017) for sjgrns,plq 200 qam x 7d,bilateral greater troch bursitis.   Eliezer Lofts, PA-C  Note - This record has been created using [employer].  Chart creation errors have been sought, but may not always  have been located. Such creation errors do not reflect on  the standard of medical care.

## 2016-11-24 LAB — COMPLETE METABOLIC PANEL WITH GFR
ALT: 17 U/L (ref 6–29)
AST: 16 U/L (ref 10–35)
Albumin: 4 g/dL (ref 3.6–5.1)
Alkaline Phosphatase: 84 U/L (ref 33–130)
BUN: 14 mg/dL (ref 7–25)
CO2: 23 mmol/L (ref 20–31)
CREATININE: 1.12 mg/dL — AB (ref 0.60–0.93)
Calcium: 10.5 mg/dL — ABNORMAL HIGH (ref 8.6–10.4)
Chloride: 105 mmol/L (ref 98–110)
GFR, Est African American: 57 mL/min — ABNORMAL LOW (ref >=60)
GFR, Est Non African American: 50 mL/min — ABNORMAL LOW (ref >=60)
Glucose, Bld: 90 mg/dL (ref 65–99)
Potassium: 3.8 mmol/L (ref 3.5–5.3)
SODIUM: 143 mmol/L (ref 135–146)
Total Bilirubin: 0.5 mg/dL (ref 0.2–1.2)
Total Protein: 6.6 g/dL (ref 6.1–8.1)

## 2017-01-31 ENCOUNTER — Other Ambulatory Visit: Payer: Self-pay | Admitting: Rheumatology

## 2017-02-01 NOTE — Telephone Encounter (Signed)
11/23/16 last visit  04/23/17 next visit    CBC Latest Ref Rng & Units 11/23/2016  WBC 3.8 - 10.8 K/uL 8.7  Hemoglobin 11.7 - 15.5 g/dL 13.0  Hematocrit 35.0 - 45.0 % 39.4  Platelets 140 - 400 K/uL 240   CMP Latest Ref Rng & Units 11/23/2016 12/24/2014 06/30/2014  Glucose 65 - 99 mg/dL 90 100(H) 85  BUN 7 - 25 mg/dL 14 17 21   Creatinine 0.60 - 0.93 mg/dL 1.12(H) 1.09 1.19  Sodium 135 - 146 mmol/L 143 140 141  Potassium 3.5 - 5.3 mmol/L 3.8 3.5 4.0  Chloride 98 - 110 mmol/L 105 106 106  CO2 20 - 31 mmol/L 23 28 28   Calcium 8.6 - 10.4 mg/dL 10.5(H) 10.4 10.6(H)  Total Protein 6.1 - 8.1 g/dL 6.6 - -  Total Bilirubin 0.2 - 1.2 mg/dL 0.5 - -  Alkaline Phos 33 - 130 U/L 84 - -  AST 10 - 35 U/L 16 - -  ALT 6 - 29 U/L 17 - -   Eye exam 08/07/16 Ok to refill per Dr Estanislado Pandy

## 2017-02-08 DIAGNOSIS — R42 Dizziness and giddiness: Secondary | ICD-10-CM | POA: Diagnosis not present

## 2017-02-08 DIAGNOSIS — R1310 Dysphagia, unspecified: Secondary | ICD-10-CM | POA: Diagnosis not present

## 2017-02-08 DIAGNOSIS — N183 Chronic kidney disease, stage 3 (moderate): Secondary | ICD-10-CM | POA: Diagnosis not present

## 2017-02-08 DIAGNOSIS — R7309 Other abnormal glucose: Secondary | ICD-10-CM | POA: Diagnosis not present

## 2017-02-08 DIAGNOSIS — Z0000 Encounter for general adult medical examination without abnormal findings: Secondary | ICD-10-CM | POA: Diagnosis not present

## 2017-02-08 DIAGNOSIS — I129 Hypertensive chronic kidney disease with stage 1 through stage 4 chronic kidney disease, or unspecified chronic kidney disease: Secondary | ICD-10-CM | POA: Diagnosis not present

## 2017-02-12 ENCOUNTER — Other Ambulatory Visit (HOSPITAL_COMMUNITY): Payer: Self-pay | Admitting: Nurse Practitioner

## 2017-02-12 DIAGNOSIS — R1310 Dysphagia, unspecified: Secondary | ICD-10-CM

## 2017-02-14 ENCOUNTER — Other Ambulatory Visit: Payer: Self-pay | Admitting: Nurse Practitioner

## 2017-02-14 DIAGNOSIS — R42 Dizziness and giddiness: Secondary | ICD-10-CM

## 2017-02-20 ENCOUNTER — Other Ambulatory Visit: Payer: 59

## 2017-02-22 ENCOUNTER — Ambulatory Visit
Admission: RE | Admit: 2017-02-22 | Discharge: 2017-02-22 | Disposition: A | Discharge status: Home / Self Care | Payer: Medicare Other | Source: Ambulatory Visit | Attending: Nurse Practitioner | Admitting: Nurse Practitioner

## 2017-02-22 DIAGNOSIS — R42 Dizziness and giddiness: Secondary | ICD-10-CM

## 2017-02-25 ENCOUNTER — Other Ambulatory Visit: Payer: Self-pay | Admitting: Internal Medicine

## 2017-02-25 DIAGNOSIS — Z1231 Encounter for screening mammogram for malignant neoplasm of breast: Secondary | ICD-10-CM

## 2017-03-06 ENCOUNTER — Ambulatory Visit
Admission: RE | Admit: 2017-03-06 | Discharge: 2017-03-06 | Disposition: A | Discharge status: Home / Self Care | Payer: Medicare Other | Source: Ambulatory Visit | Attending: Internal Medicine | Admitting: Internal Medicine

## 2017-03-06 DIAGNOSIS — Z1231 Encounter for screening mammogram for malignant neoplasm of breast: Secondary | ICD-10-CM | POA: Diagnosis not present

## 2017-03-27 DIAGNOSIS — Z23 Encounter for immunization: Secondary | ICD-10-CM | POA: Diagnosis not present

## 2017-04-10 NOTE — Progress Notes (Deleted)
Office Visit Note  Patient: Amanda Ross             Date of Birth: Oct 21, 1945           MRN: [id number]             PCP: [name], MD Referring: [name], MD Visit Date: 04/23/2017 Occupation: @GUAROCC @    Subjective:  No chief complaint on file.   History of Present Illness: Amanda Ross is a 71 y.o. female ***   Activities of Daily Living:  Patient reports morning stiffness for *** {minute/hour:19697}.   Patient {ACTIONS;DENIES/REPORTS:21021675::"Denies"} nocturnal pain.  Difficulty dressing/grooming: {ACTIONS;DENIES/REPORTS:21021675::"Denies"} Difficulty climbing stairs: {ACTIONS;DENIES/REPORTS:21021675::"Denies"} Difficulty getting out of chair: {ACTIONS;DENIES/REPORTS:21021675::"Denies"} Difficulty using hands for taps, buttons, cutlery, and/or writing: {ACTIONS;DENIES/REPORTS:21021675::"Denies"}   No Rheumatology ROS completed.   PMFS History:  Patient Active Problem List   Diagnosis Date Noted  . Sjogren's syndrome ([place]) 05/22/2016  . Sicca syndrome, unspecified ([place]) 05/22/2016  . High risk medication use 05/22/2016  . Primary hyperparathyroidism ([place]) 07/09/2013  . Left thyroid nodule 07/06/2013  . Dyspnea 10/11/2010    Past Medical History:  Diagnosis Date  . Asthma   . GERD (gastroesophageal reflux disease)   . H/O bladder infections   . H/O measles   . H/O varicella   . Headache(784.0)    Frequently  . Hypertension   . OSA (obstructive sleep apnea)   . Osteoarthritis   . Yeast infection     Family History  Problem Relation Age of Onset  . Asthma Daughter   . Heart disease Mother        CHF  . Deep vein thrombosis Mother   . Rheum arthritis Mother   . Heart disease Brother   . Rheum arthritis Sister    Past Surgical History:  Procedure Laterality Date  . BUNIONECTOMY    . CARPAL TUNNEL RELEASE    . TOTAL SHOULDER ARTHROPLASTY     Social History   Social History Narrative  . No narrative on file      Objective: Vital Signs: There were no vitals taken for this visit.   Physical Exam   Musculoskeletal Exam: ***  CDAI Exam: No CDAI exam completed.    Investigation: No additional findings. PLQ eye exam: 08/07/2016  CBC Latest Ref Rng & Units 11/23/2016  WBC 3.8 - 10.8 K/uL 8.7  Hemoglobin 11.7 - 15.5 g/dL 13.0  Hematocrit 35.0 - 45.0 % 39.4  Platelets 140 - 400 K/uL 240   CMP Latest Ref Rng & Units 11/23/2016 12/24/2014 06/30/2014  Glucose 65 - 99 mg/dL 90 100(H) 85  BUN 7 - 25 mg/dL 14 17 21   Creatinine 0.60 - 0.93 mg/dL 1.12(H) 1.09 1.19  Sodium 135 - 146 mmol/L 143 140 141  Potassium 3.5 - 5.3 mmol/L 3.8 3.5 4.0  Chloride 98 - 110 mmol/L 105 106 106  CO2 20 - 31 mmol/L 23 28 28   Calcium 8.6 - 10.4 mg/dL 10.5(H) 10.4 10.6(H)  Total Protein 6.1 - 8.1 g/dL 6.6 - -  Total Bilirubin 0.2 - 1.2 mg/dL 0.5 - -  Alkaline Phos 33 - 130 U/L 84 - -  AST 10 - 35 U/L 16 - -  ALT 6 - 29 U/L 17 - -   Imaging: No results found.  Speciality Comments: No specialty comments available.    Procedures:  No procedures performed Allergies: Iohexol; Penicillins; and Percocet [oxycodone-acetaminophen]   Assessment / Plan:     Visit Diagnoses: No diagnosis found.  Orders: No orders of the defined types were placed in this encounter.  No orders of the defined types were placed in this encounter.   Face-to-face time spent with patient was *** minutes. 50% of time was spent in counseling and coordination of care.  Follow-Up Instructions: No Follow-up on file.   Earnestine Mealing, NT  Note - This record has been created using Editor, commissioning.  Chart creation errors have been sought, but may not always  have been located. Such creation errors do not reflect on  the standard of medical care.

## 2017-04-18 DIAGNOSIS — H6121 Impacted cerumen, right ear: Secondary | ICD-10-CM | POA: Diagnosis not present

## 2017-04-18 DIAGNOSIS — N183 Chronic kidney disease, stage 3 (moderate): Secondary | ICD-10-CM | POA: Diagnosis not present

## 2017-04-18 DIAGNOSIS — R21 Rash and other nonspecific skin eruption: Secondary | ICD-10-CM | POA: Diagnosis not present

## 2017-04-23 ENCOUNTER — Ambulatory Visit: Payer: Medicare Other | Admitting: Rheumatology

## 2017-07-24 NOTE — Progress Notes (Signed)
Office Visit Note  Patient: Amanda Ross             Date of Birth: 05/19/1946           MRN: [id number]             PCP: [name], MD Referring: [name], MD Visit Date: 07/30/2017 Occupation: '@GUAROCC' @    Subjective:  Dry mouth    History of Present Illness: Amanda Ross is a 72 y.o. female with history of Sjogren's.  Patient states that she continues to have dry eye or dry mouth.  She continues to use OTC products, which provide minimal relief.  She states she has not seen her dentist recently, but she is going to schedule Ross appointment soon.  She continues to take PLQ 200 mg one tablet daily.  She scheduled eye exam for 08/09/17 with Dr. Herbert Deaner.   She states that she occasionally experiences joint pain.  No joint swelling.  She has very minimal joint stiffness.  She uses voltaren gel on her hands and knees.  She continues to have trochanteric bursitis bilaterally.   She has been having abdominal cramps and gas for the past 1 day.  She denies any diarrhea, vomiting, or nausea.  She denies any blood in stool or bloating.  She states her appetite has been good.  She denies any sick contacts.  No fever or chills.    She reports she has had a swollen lymph node on the right side of her neck chronically. She states it swells occasionally, especially if she is sick.  She experiences occasional mild tenderness.  She reports she has night sweats and fatigue.  She denies unintentional weight loss.   Activities of Daily Living:  Patient reports morning stiffness for 5  minutes.   Patient Denies nocturnal pain.  Difficulty dressing/grooming: Denies Difficulty climbing stairs: Denies Difficulty getting out of chair: Denies Difficulty using hands for taps, buttons, cutlery, and/or writing: Denies   Review of Systems  Constitutional: Positive for fatigue and night sweats. Negative for weakness.  HENT: Positive for mouth dryness. Negative for mouth sores and nose dryness.     Eyes: Positive for dryness. Negative for pain, redness and visual disturbance.  Respiratory: Negative for cough, hemoptysis, shortness of breath and difficulty breathing.   Cardiovascular: Negative for chest pain, palpitations, hypertension and swelling in legs/feet.  Gastrointestinal: Positive for diarrhea. Negative for blood in stool and constipation.  Endocrine: Negative for increased urination.  Genitourinary: Negative for painful urination.  Musculoskeletal: Positive for morning stiffness. Negative for arthralgias, joint pain, joint swelling, myalgias, muscle weakness, muscle tenderness and myalgias.  Skin: Negative for color change, rash, hair loss, nodules/bumps, redness, skin tightness, ulcers and sensitivity to sunlight.  Allergic/Immunologic: Negative for susceptible to infections.  Neurological: Positive for headaches, memory loss and night sweats. Negative for dizziness and numbness.  Hematological: Positive for swollen glands.  Psychiatric/Behavioral: Positive for depressed mood and sleep disturbance. The patient is not nervous/anxious.     PMFS History:  Patient Active Problem List   Diagnosis Date Noted  . Trochanteric bursitis of both hips 07/30/2017  . Sjogren's syndrome ([place]) 05/22/2016  . Sicca syndrome, unspecified ([place]) 05/22/2016  . High risk medication use 05/22/2016  . Primary hyperparathyroidism ([place]) 07/09/2013  . Left thyroid nodule 07/06/2013  . Dyspnea 10/11/2010    Past Medical History:  Diagnosis Date  . Asthma   . GERD (gastroesophageal reflux disease)   . H/O bladder infections   . H/O  measles   . H/O varicella   . Headache(784.0)    Frequently  . Hypertension   . OSA (obstructive sleep apnea)   . Osteoarthritis   . Yeast infection     Family History  Problem Relation Age of Onset  . Asthma Daughter   . Heart disease Mother        CHF  . Deep vein thrombosis Mother   . Rheum arthritis Mother   . Heart disease Brother   . Rheum  arthritis Sister    Past Surgical History:  Procedure Laterality Date  . BUNIONECTOMY    . CARPAL TUNNEL RELEASE    . TOTAL SHOULDER ARTHROPLASTY     Social History   Social History Narrative  . Not on file     Objective: Vital Signs: BP 136/72 (BP Location: Left Arm, Patient Position: Sitting, Cuff Size: Normal)   Pulse 83   Resp 16   Ht '5\' 4"'  (1.626 m)   Wt 227 lb (103 kg)   BMI 38.96 kg/m    Physical Exam  Constitutional: She is oriented to person, place, and time. She appears well-developed and well-nourished.  HENT:  Head: Normocephalic and atraumatic.  Eyes: Conjunctivae and EOM are normal.  Neck: Normal range of motion.  Cardiovascular: Normal rate, regular rhythm, normal heart sounds and intact distal pulses.  Pulmonary/Chest: Effort normal and breath sounds normal.  Abdominal: Soft. Bowel sounds are normal.  Lymphadenopathy:    She has cervical adenopathy (right occipital lymph node).  Neurological: She is alert and oriented to person, place, and time.  Skin: Skin is warm and dry. Capillary refill takes less than 2 seconds.  Psychiatric: She has a normal mood and affect. Her behavior is normal.  Nursing note and vitals reviewed.    Musculoskeletal Exam: C-spine limited ROM with lateral rotation.  Thoracic and lumbar good ROM.  No midline spinal tenderness or SI joint tenderness.  Shoulder joints, elbow joints, wrist joints, MCPs, PIPs, and DIPs good ROM with no synovitis.  Hip joints, knee joints, ankle joints, MTPs, PIPs, and DIPs good ROM with no synovitis.  She has tenderness of bilateral trochanteric bursa.    CDAI Exam: No CDAI exam completed.    Investigation: No additional findings. CBC Latest Ref Rng & Units 11/23/2016  WBC 3.8 - 10.8 K/uL 8.7  Hemoglobin 11.7 - 15.5 g/dL 13.0  Hematocrit 35.0 - 45.0 % 39.4  Platelets 140 - 400 K/uL 240   CMP Latest Ref Rng & Units 11/23/2016 12/24/2014 06/30/2014  Glucose 65 - 99 mg/dL 90 100(H) 85  BUN 7 - 25 mg/dL  '14 17 21  ' Creatinine 0.60 - 0.93 mg/dL 1.12(H) 1.09 1.19  Sodium 135 - 146 mmol/L 143 140 141  Potassium 3.5 - 5.3 mmol/L 3.8 3.5 4.0  Chloride 98 - 110 mmol/L 105 106 106  CO2 20 - 31 mmol/L '23 28 28  ' Calcium 8.6 - 10.4 mg/dL 10.5(H) 10.4 10.6(H)  Total Protein 6.1 - 8.1 g/dL 6.6 - -  Total Bilirubin 0.2 - 1.2 mg/dL 0.5 - -  Alkaline Phos 33 - 130 U/L 84 - -  AST 10 - 35 U/L 16 - -  ALT 6 - 29 U/L 17 - -    Imaging: No results found.  Speciality Comments: No specialty comments available.    Procedures:  No procedures performed Allergies: Iohexol; Penicillins; and Percocet [oxycodone-acetaminophen]   Assessment / Plan:     Visit Diagnoses: Sjogren's syndrome with other organ involvement ([place]) -She continues  to have sicca symptoms.  She uses OTC products, which provide minimal.  She is going to schedule Ross appointment with her dentist.  She continues to take Plaquenil 200 mg 1 tablet daily.  CBC and CMP drawn today. UA performed today.  I also ordered a RF, Sed rate, and SPEP today.  She has no parotid swelling on exam. She has a swollen right occipital lymph node.  I discussed with Dr. Estanislado Pandy, and a referral was made to ENT to evaluate and to determine if she will require a biopsy.  Patient reports she has been having night sweats and increased fatigue but denies unintentional weight loss.  She reports her lymph node remains chronically swollen and is reactive to infections.- Plan: Rheumatoid factor, Serum protein electrophoresis with reflex, Sedimentation rate  Cervical lymphadenopathy - Right occipital lymph node swollen on exam today.  She reports it is chronically swollen and reactive when she has Ross infection. Due to her history of sjogren's, night sweats, and increased fatigue I referred her to ENT for evaluation.  RF, SPEP, and ESR ordered today. Plan: Ambulatory referral to ENT   High risk medication use - PLQ 200 mg once daily. Eye exam: 08/07/16, Dr. Herbert Deaner. She scheduled  her next eye exam for 08/09/17. CBC and CMP obtained today. - Plan: CBC with Differential/Platelet, COMPLETE METABOLIC PANEL WITH GFR, Urinalysis, Routine w reflex microscopic  Trochanteric bursitis of both hips: She continues to have tenderness of bilateral trochanteric bursa.  She declined a cortisone injection today.  She was given a handout of exercises she can perform at home.   Primary hyperparathyroidism ([place])     Orders: Orders Placed This Encounter  Procedures  . CBC with Differential/Platelet  . COMPLETE METABOLIC PANEL WITH GFR  . Rheumatoid factor  . Serum protein electrophoresis with reflex  . Urinalysis, Routine w reflex microscopic  . Sedimentation rate  . Ambulatory referral to ENT   No orders of the defined types were placed in this encounter.   Face-to-face time spent with patient was 45 minutes. Greater than 50% of time was spent in counseling and coordination of care.   Follow-Up Instructions: Return for Sjogren's syndrome.   Ofilia Neas, PA-C  Note - This record has been created using Dragon software.  Chart creation errors have been sought, but may not always  have been located. Such creation errors do not reflect on  the standard of medical care.

## 2017-07-30 ENCOUNTER — Encounter: Payer: Self-pay | Admitting: Physician Assistant

## 2017-07-30 ENCOUNTER — Encounter (INDEPENDENT_AMBULATORY_CARE_PROVIDER_SITE_OTHER): Payer: Self-pay

## 2017-07-30 ENCOUNTER — Ambulatory Visit (INDEPENDENT_AMBULATORY_CARE_PROVIDER_SITE_OTHER): Payer: Medicare Other | Admitting: Physician Assistant

## 2017-07-30 VITALS — BP 136/72 | HR 83 | Resp 16 | Ht 64.0 in | Wt 227.0 lb

## 2017-07-30 DIAGNOSIS — M7061 Trochanteric bursitis, right hip: Secondary | ICD-10-CM

## 2017-07-30 DIAGNOSIS — E210 Primary hyperparathyroidism: Secondary | ICD-10-CM | POA: Diagnosis not present

## 2017-07-30 DIAGNOSIS — M7062 Trochanteric bursitis, left hip: Secondary | ICD-10-CM | POA: Diagnosis not present

## 2017-07-30 DIAGNOSIS — Z79899 Other long term (current) drug therapy: Secondary | ICD-10-CM | POA: Diagnosis not present

## 2017-07-30 DIAGNOSIS — R590 Localized enlarged lymph nodes: Secondary | ICD-10-CM

## 2017-07-30 DIAGNOSIS — M3509 Sicca syndrome with other organ involvement: Secondary | ICD-10-CM

## 2017-07-30 NOTE — Patient Instructions (Signed)

## 2017-07-31 LAB — URINALYSIS, ROUTINE W REFLEX MICROSCOPIC
BILIRUBIN URINE: NEGATIVE
Glucose, UA: NEGATIVE
Hgb urine dipstick: NEGATIVE
Ketones, ur: NEGATIVE
Leukocytes, UA: NEGATIVE
Nitrite: NEGATIVE
Protein, ur: NEGATIVE
SPECIFIC GRAVITY, URINE: 1.017 (ref 1.001–1.03)
pH: 5.5 (ref 5.0–8.0)

## 2017-07-31 LAB — SEDIMENTATION RATE: SED RATE: 22 mm/h (ref 0–30)

## 2017-08-01 LAB — COMPLETE METABOLIC PANEL WITH GFR
AG Ratio: 1.4 (calc) (ref 1.0–2.5)
ALBUMIN MSPROF: 4 g/dL (ref 3.6–5.1)
ALKALINE PHOSPHATASE (APISO): 85 U/L (ref 33–130)
ALT: 17 U/L (ref 6–29)
AST: 16 U/L (ref 10–35)
BILIRUBIN TOTAL: 0.5 mg/dL (ref 0.2–1.2)
BUN/Creatinine Ratio: 15 (calc) (ref 6–22)
BUN: 18 mg/dL (ref 7–25)
CHLORIDE: 105 mmol/L (ref 98–110)
CO2: 29 mmol/L (ref 20–32)
Calcium: 11 mg/dL — ABNORMAL HIGH (ref 8.6–10.4)
Creat: 1.17 mg/dL — ABNORMAL HIGH (ref 0.60–0.93)
GFR, Est African American: 54 mL/min/{1.73_m2} — ABNORMAL LOW (ref >=60)
GFR, Est Non African American: 47 mL/min/{1.73_m2} — ABNORMAL LOW (ref >=60)
GLUCOSE: 70 mg/dL (ref 65–99)
Globulin: 2.8 g/dL (calc) (ref 1.9–3.7)
Potassium: 4.1 mmol/L (ref 3.5–5.3)
Sodium: 140 mmol/L (ref 135–146)
Total Protein: 6.8 g/dL (ref 6.1–8.1)

## 2017-08-01 LAB — CBC WITH DIFFERENTIAL/PLATELET
Basophils Absolute: 87 cells/uL (ref 0–200)
Basophils Relative: 1 %
EOS PCT: 5.9 %
Eosinophils Absolute: 513 cells/uL — ABNORMAL HIGH (ref 15–500)
HCT: 37.5 % (ref 35.0–45.0)
HEMOGLOBIN: 13 g/dL (ref 11.7–15.5)
Lymphs Abs: 2610 cells/uL (ref 850–3900)
MCH: 29.8 pg (ref 27.0–33.0)
MCHC: 34.7 g/dL (ref 32.0–36.0)
MCV: 86 fL (ref 80.0–100.0)
MPV: 12.8 fL — AB (ref 7.5–12.5)
Monocytes Relative: 6.8 %
NEUTROS ABS: 4898 {cells}/uL (ref 1500–7800)
Neutrophils Relative %: 56.3 %
PLATELETS: 227 10*3/uL (ref 140–400)
RBC: 4.36 10*6/uL (ref 3.80–5.10)
RDW: 13.2 % (ref 11.0–15.0)
TOTAL LYMPHOCYTE: 30 %
WBC mixed population: 592 cells/uL (ref 200–950)
WBC: 8.7 10*3/uL (ref 3.8–10.8)

## 2017-08-01 LAB — PROTEIN ELECTROPHORESIS, SERUM, WITH REFLEX
ALBUMIN ELP: 3.8 g/dL (ref 3.8–4.8)
ALPHA 1: 0.3 g/dL (ref 0.2–0.3)
Alpha 2: 0.8 g/dL (ref 0.5–0.9)
BETA 2: 0.4 g/dL (ref 0.2–0.5)
Beta Globulin: 0.4 g/dL (ref 0.4–0.6)
Gamma Globulin: 0.9 g/dL (ref 0.8–1.7)
Total Protein: 6.7 g/dL (ref 6.1–8.1)

## 2017-08-01 LAB — RHEUMATOID FACTOR: Rhuematoid fact SerPl-aCnc: <14 IU/mL (ref <14)

## 2017-08-02 ENCOUNTER — Other Ambulatory Visit: Payer: Self-pay | Admitting: Rheumatology

## 2017-08-02 NOTE — Telephone Encounter (Signed)
Last Visit: 07/30/17 Next Visit: 10/29/17 Labs: 07/30/17 Creat. Elevated  PLQ Eye Exam: : 08/07/16 WNL  Okay to refill per Dr. Estanislado Pandy

## 2017-08-02 NOTE — Progress Notes (Signed)
Calcium is elevated.  Please advise patient to avoid taking Calcium supplements if she is taking them.  Please fax results to PCP.   Creatinine continues to be elevated.  Please advise her to avoid NSAIDs. We will continue to monitor.

## 2017-08-05 ENCOUNTER — Ambulatory Visit (INDEPENDENT_AMBULATORY_CARE_PROVIDER_SITE_OTHER): Payer: Medicare Other | Admitting: Physician Assistant

## 2017-08-05 ENCOUNTER — Encounter: Payer: Self-pay | Admitting: Physician Assistant

## 2017-08-05 VITALS — BP 133/74 | HR 67

## 2017-08-05 DIAGNOSIS — M7062 Trochanteric bursitis, left hip: Secondary | ICD-10-CM

## 2017-08-05 DIAGNOSIS — M7061 Trochanteric bursitis, right hip: Secondary | ICD-10-CM

## 2017-08-05 MED ORDER — TRIAMCINOLONE ACETONIDE 40 MG/ML IJ SUSP
40.0000 mg | INTRAMUSCULAR | Status: AC | PRN
  Administered 2017-08-05: 40 mg via INTRA_ARTICULAR

## 2017-08-05 MED ORDER — LIDOCAINE HCL 1 % IJ SOLN
1.5000 mL | INTRAMUSCULAR | Status: AC | PRN
  Administered 2017-08-05: 1.5 mL

## 2017-08-05 NOTE — Progress Notes (Signed)
   Procedure Note  Patient: Amanda Ross             Date of Birth: Oct 21, 1945           MRN: [id number]             Visit Date: 08/05/2017  Procedures: Visit Diagnoses: Trochanteric bursitis of both hips  Large Joint Inj: bilateral greater trochanter on 08/05/2017 1:21 PM Indications: pain Details: 27 G 1.5 in needle, lateral approach  Arthrogram: No  Medications (Right): 1.5 mL lidocaine 1 %; 40 mg triamcinolone acetonide 40 MG/ML Aspirate (Right): 0 mL Medications (Left): 1.5 mL lidocaine 1 %; 40 mg triamcinolone acetonide 40 MG/ML Aspirate (Left): 0 mL Outcome: tolerated well, no immediate complications Procedure, treatment alternatives, risks and benefits explained, specific risks discussed. Consent was given by the patient. Immediately prior to procedure a time out was called to verify the correct patient, procedure, equipment, support staff and site/side marked as required. Patient was prepped and draped in the usual sterile fashion.      Hazel Sams, PA-C

## 2017-08-06 DIAGNOSIS — H25013 Cortical age-related cataract, bilateral: Secondary | ICD-10-CM | POA: Diagnosis not present

## 2017-08-06 DIAGNOSIS — H35033 Hypertensive retinopathy, bilateral: Secondary | ICD-10-CM | POA: Diagnosis not present

## 2017-08-06 DIAGNOSIS — H2513 Age-related nuclear cataract, bilateral: Secondary | ICD-10-CM | POA: Diagnosis not present

## 2017-08-06 DIAGNOSIS — H40013 Open angle with borderline findings, low risk, bilateral: Secondary | ICD-10-CM | POA: Diagnosis not present

## 2017-10-01 ENCOUNTER — Other Ambulatory Visit (INDEPENDENT_AMBULATORY_CARE_PROVIDER_SITE_OTHER): Payer: Self-pay | Admitting: Otolaryngology

## 2017-10-01 DIAGNOSIS — R221 Localized swelling, mass and lump, neck: Secondary | ICD-10-CM | POA: Diagnosis not present

## 2017-10-03 ENCOUNTER — Other Ambulatory Visit (INDEPENDENT_AMBULATORY_CARE_PROVIDER_SITE_OTHER): Payer: Self-pay | Admitting: Otolaryngology

## 2017-10-03 ENCOUNTER — Ambulatory Visit: Payer: Medicare Other | Admitting: Rheumatology

## 2017-10-03 DIAGNOSIS — R221 Localized swelling, mass and lump, neck: Secondary | ICD-10-CM

## 2017-10-09 ENCOUNTER — Ambulatory Visit
Admission: RE | Admit: 2017-10-09 | Discharge: 2017-10-09 | Disposition: A | Discharge status: Home / Self Care | Payer: Medicare Other | Source: Ambulatory Visit | Attending: Otolaryngology | Admitting: Otolaryngology

## 2017-10-09 ENCOUNTER — Other Ambulatory Visit: Payer: Medicare Other

## 2017-10-09 DIAGNOSIS — R221 Localized swelling, mass and lump, neck: Secondary | ICD-10-CM | POA: Diagnosis not present

## 2017-10-09 MED ORDER — IOPAMIDOL (ISOVUE-300) INJECTION 61%
75.0000 mL | Freq: Once | INTRAVENOUS | Status: AC | PRN
  Administered 2017-10-09: 75 mL via INTRAVENOUS

## 2017-10-15 NOTE — Progress Notes (Signed)
Office Visit Note  Patient: Amanda Ross             Date of Birth: 09/08/1945           MRN: [id number]             PCP: [name], MD Referring: [name], MD Visit Date: 10/29/2017 Occupation: @GUAROCC @    Subjective:  Dry mouth and dry eyes   History of Present Illness: Amanda Ross is a 72 y.o. female with history of Sjogren's and trochanteric bursitis bilaterally.  Patient states she continues to have sicca symptoms.  She is taking Plaquenil 200 mg once daily.  She sees a dentist on a regular basis and currently has a dental cavity and will be following up soon. She reports that she continues to have a swollen lymph node on the right side of her neck.  She states she was seen by ENT and had a CT scan performed.  She denies any other swollen lymph nodes or increase in size of node.  She reports she continues to have night sweats and worsening fatigue. There was a currently her allergies have been bothering her and she has a cough and congestion.  She states that she has pain in her ribs due to her cough. She reports that the trochanter bursitis bilaterally has improved.  She reports her pain improved after the cortisone injection of bilateral trochanteric bursa.   Activities of Daily Living:  Patient reports morning stiffness for 1  hour.   Patient Reports nocturnal pain.  Difficulty dressing/grooming: Denies Difficulty climbing stairs: Reports Difficulty getting out of chair: Reports Difficulty using hands for taps, buttons, cutlery, and/or writing: Denies   Review of Systems  Constitutional: Positive for fatigue.  HENT: Positive for mouth dryness. Negative for mouth sores and nose dryness.   Eyes: Positive for dryness. Negative for pain and visual disturbance.  Respiratory: Positive for cough. Negative for hemoptysis, shortness of breath and difficulty breathing.   Cardiovascular: Negative for chest pain, palpitations, hypertension and swelling in  legs/feet.  Gastrointestinal: Positive for constipation. Negative for blood in stool and diarrhea.  Endocrine: Negative for increased urination.  Genitourinary: Negative for painful urination.  Musculoskeletal: Positive for arthralgias, joint pain and morning stiffness. Negative for joint swelling, myalgias, muscle weakness, muscle tenderness and myalgias.  Skin: Positive for rash. Negative for color change, pallor, hair loss, nodules/bumps, skin tightness, ulcers and sensitivity to sunlight.  Allergic/Immunologic: Negative for susceptible to infections.  Neurological: Negative for dizziness, numbness, headaches and weakness.  Hematological: Positive for swollen glands.  Psychiatric/Behavioral: Positive for depressed mood. Negative for sleep disturbance. The patient is not nervous/anxious.     PMFS History:  Patient Active Problem List   Diagnosis Date Noted  . Trochanteric bursitis of both hips 07/30/2017  . Sjogren's syndrome ([place]) 05/22/2016  . Sicca syndrome, unspecified ([place]) 05/22/2016  . High risk medication use 05/22/2016  . Primary hyperparathyroidism ([place]) 07/09/2013  . Left thyroid nodule 07/06/2013  . Dyspnea 10/11/2010    Past Medical History:  Diagnosis Date  . Asthma   . GERD (gastroesophageal reflux disease)   . H/O bladder infections   . H/O measles   . H/O varicella   . Headache(784.0)    Frequently  . Hypertension   . OSA (obstructive sleep apnea)   . Osteoarthritis   . Yeast infection     Family History  Problem Relation Age of Onset  . Asthma Daughter   . Heart disease Mother  CHF  . Deep vein thrombosis Mother   . Rheum arthritis Mother   . Heart disease Brother   . Rheum arthritis Sister    Past Surgical History:  Procedure Laterality Date  . BUNIONECTOMY    . CARPAL TUNNEL RELEASE    . TOTAL SHOULDER ARTHROPLASTY     Social History   Social History Narrative  . Not on file     Objective: Vital Signs: BP 130/74 (BP Location:  Left Arm, Patient Position: Sitting, Cuff Size: Large)   Pulse 81   Resp 17   Ht 5\' 3"  (1.6 m)   Wt 225 lb (102.1 kg)   BMI 39.86 kg/m    Physical Exam  Constitutional: She is oriented to person, place, and time. She appears well-developed and well-nourished.  HENT:  Head: Normocephalic and atraumatic.  Eyes: Conjunctivae and EOM are normal.  Neck: Normal range of motion.  Cardiovascular: Normal rate, regular rhythm, normal heart sounds and intact distal pulses.  Pulmonary/Chest: Effort normal and breath sounds normal.  Abdominal: Soft. Bowel sounds are normal.  Lymphadenopathy:    She has cervical adenopathy.  Neurological: She is alert and oriented to person, place, and time.  Skin: Skin is warm and dry. Capillary refill takes less than 2 seconds.  Psychiatric: She has a normal mood and affect. Her behavior is normal.  Nursing note and vitals reviewed.    Musculoskeletal Exam: C-spine limited range of motion.  Thoracic lumbar spine limited range of motion.  She has no midline spinal tenderness.  No SI joint tenderness.  Shoulder joints, elbow joints, wrist joints, MCPs, PIPs, DIPs good range of motion with no synovitis.  Hip joints, knee joints, ankle joints, MTPs, PIPs, DIPs good range of motion no synovitis.  No warmth or effusion of bilateral knees.  She has limited extension of her left knee.  She has tenderness of bilateral trochanteric bursa.   CDAI Exam: No CDAI exam completed.    Investigation: No additional findings. CBC Latest Ref Rng & Units 07/30/2017 11/23/2016  WBC 3.8 - 10.8 Thousand/uL 8.7 8.7  Hemoglobin 11.7 - 15.5 g/dL 13.0 13.0  Hematocrit 35.0 - 45.0 % 37.5 39.4  Platelets 140 - 400 Thousand/uL 227 240   CMP Latest Ref Rng & Units 07/30/2017 07/30/2017 11/23/2016  Glucose 65 - 99 mg/dL - 70 90  BUN 7 - 25 mg/dL - 18 14  Creatinine 0.60 - 0.93 mg/dL - 1.17(H) 1.12(H)  Sodium 135 - 146 mmol/L - 140 143  Potassium 3.5 - 5.3 mmol/L - 4.1 3.8  Chloride 98 - 110  mmol/L - 105 105  CO2 20 - 32 mmol/L - 29 23  Calcium 8.6 - 10.4 mg/dL - 11.0(H) 10.5(H)  Total Protein 6.1 - 8.1 g/dL 6.7 6.8 6.6  Total Bilirubin 0.2 - 1.2 mg/dL - 0.5 0.5  Alkaline Phos 33 - 130 U/L - - 84  AST 10 - 35 U/L - 16 16  ALT 6 - 29 U/L - 17 17    Imaging: Ct Soft Tissue Neck W Contrast  Result Date: 10/09/2017 CLINICAL DATA:  Right-sided neck mass for several years. Creatinine was obtained on site at [facility] at [address] Results: Creatinine 1.2 mg/dL. EXAM: CT NECK WITH CONTRAST TECHNIQUE: Multidetector CT imaging of the neck was performed using the standard protocol following the bolus administration of intravenous contrast. CONTRAST:  30mL ISOVUE-300 IOPAMIDOL (ISOVUE-300) INJECTION 61% COMPARISON:  Thyroid ultrasound 07/14/2013 FINDINGS: Pharynx and larynx: 1.6 cm dense but nonmetallic foreign  body in the oral cavity between the tongue and palate on the right. No evidence of pharyngeal mass or swelling. No inflammatory changes or fluid collection in the parapharyngeal or retropharyngeal spaces. Unremarkable larynx. Salivary glands: No inflammation, mass, or stone. Thyroid: Approximately 1.7 cm exophytic nodule extending posteriorly from the thyroid lobe which likely corresponds to the dominant nodule on ultrasound. Lymph nodes: No enlarged or suspicious lymph nodes in the neck. Vascular: Major vascular structures of the neck are patent. Mild aortic arch and carotid artery atherosclerosis. Limited intracranial: Unremarkable. Visualized orbits: Not imaged. Mastoids and visualized paranasal sinuses: Clear. Skeleton: Prior extraction of all maxillary teeth. Multiple mandibular dental caries with mild periapical lucency involving the right second premolar and first molar teeth. Advanced multilevel cervical disc and predominantly left-sided facet degeneration. Likely moderate spinal stenosis at C6-7. Moderate to severe neural foraminal stenosis at multiple levels. Upper  chest: Clear lung apices. Other: The skin marker placed to indicate the area of clinical concern is located in the posterior right lateral neck at the C3 level. A mildly prominent vein and a few lymph nodes measuring up to 3 mm in short axis are present in this region. No mass, fluid collection, or inflammatory changes are seen. IMPRESSION: 1. No right-sided neck mass identified in the area of clinical concern. 2. Advanced cervical disc and facet degeneration. 3.  Aortic Atherosclerosis (ICD10-I70.0). Electronically Signed   By: Logan Bores M.D.   On: 10/09/2017 14:01    Speciality Comments: No specialty comments available.    Procedures:  No procedures performed Allergies: Iohexol; Penicillins; and Percocet [oxycodone-acetaminophen]   Assessment / Plan:     Visit Diagnoses: Sjogren's syndrome with other organ involvement [facility]): She continues to have sicca symptoms.  She has no parotid swelling on exam.  She takes Plaquenil 200 mg 1 tablet daily.  She does not need any refills at this time.  She sees her dentist on a regular basis and currently has a dental cavity and will be following up with her dentist soon.  CBC, CMP, RF, SPEP, and UA were checked on 07/30/2017.  Cervical lymphadenopathy -she continues to have a palpable lymph node on the right side of her neck.  She was referred to ENT who ordered a CT of her neck.  A CT scan was performed on 10/09/2017 which did not reveal any enlarged or suspicious lymph nodes.  The CT also did not reveal any inflammation of salivary glands.    High risk medication use -  PLQ 200 mg once daily. Eye exam: 08/09/2017 by Dr. Cori Razor per patient.  CBC and CMP were drawn on 07/30/2017.  Trochanteric bursitis of both hips: She continues to have mild tenderness of bilateral trochanteric bursa.  She had cortisone injections of bilateral trochanteric bursa on 08/05/2017 which improved her symptoms significantly.  Primary hyperparathyroidism ([place])    Orders: No  orders of the defined types were placed in this encounter.  No orders of the defined types were placed in this encounter.   Face-to-face time spent with patient was 30 minutes. >50% of time was spent in counseling and coordination of care.  Follow-Up Instructions: Return for Sjogren's syndrome.   Amanda Neas, PA-C  Note - This record has been created using Dragon software.  Chart creation errors have been sought, but may not always  have been located. Such creation errors do not reflect on  the standard of medical care.

## 2017-10-29 ENCOUNTER — Ambulatory Visit (INDEPENDENT_AMBULATORY_CARE_PROVIDER_SITE_OTHER): Payer: Medicare Other | Admitting: Physician Assistant

## 2017-10-29 ENCOUNTER — Other Ambulatory Visit: Payer: Self-pay | Admitting: Rheumatology

## 2017-10-29 ENCOUNTER — Encounter: Payer: Self-pay | Admitting: Physician Assistant

## 2017-10-29 VITALS — BP 130/74 | HR 81 | Resp 17 | Ht 63.0 in | Wt 225.0 lb

## 2017-10-29 DIAGNOSIS — E210 Primary hyperparathyroidism: Secondary | ICD-10-CM

## 2017-10-29 DIAGNOSIS — M7061 Trochanteric bursitis, right hip: Secondary | ICD-10-CM | POA: Diagnosis not present

## 2017-10-29 DIAGNOSIS — R590 Localized enlarged lymph nodes: Secondary | ICD-10-CM

## 2017-10-29 DIAGNOSIS — M7062 Trochanteric bursitis, left hip: Secondary | ICD-10-CM | POA: Diagnosis not present

## 2017-10-29 DIAGNOSIS — Z79899 Other long term (current) drug therapy: Secondary | ICD-10-CM | POA: Diagnosis not present

## 2017-10-29 DIAGNOSIS — M3509 Sicca syndrome with other organ involvement: Secondary | ICD-10-CM | POA: Diagnosis not present

## 2017-10-30 ENCOUNTER — Encounter (HOSPITAL_COMMUNITY): Payer: Self-pay | Admitting: Emergency Medicine

## 2017-10-30 ENCOUNTER — Ambulatory Visit (HOSPITAL_COMMUNITY)
Admission: EM | Admit: 2017-10-30 | Discharge: 2017-10-30 | Disposition: A | Discharge status: Home / Self Care | Payer: Medicare Other | Attending: Family Medicine | Admitting: Family Medicine

## 2017-10-30 ENCOUNTER — Other Ambulatory Visit: Payer: Self-pay | Admitting: Rheumatology

## 2017-10-30 ENCOUNTER — Other Ambulatory Visit: Payer: Self-pay

## 2017-10-30 DIAGNOSIS — B9789 Other viral agents as the cause of diseases classified elsewhere: Secondary | ICD-10-CM

## 2017-10-30 DIAGNOSIS — J069 Acute upper respiratory infection, unspecified: Secondary | ICD-10-CM | POA: Diagnosis not present

## 2017-10-30 MED ORDER — BENZONATATE 200 MG PO CAPS: 200.0000 mg | ORAL_CAPSULE | Freq: Three times a day (TID) | ORAL | 0 refills | Status: DC

## 2017-10-30 MED ORDER — CETIRIZINE HCL 10 MG PO CAPS: 10.0000 mg | ORAL_CAPSULE | Freq: Every day | ORAL | 0 refills | Status: DC

## 2017-10-30 MED ORDER — TRIAMCINOLONE ACETONIDE 55 MCG/ACT NA AERO: 2.0000 | INHALATION_SPRAY | Freq: Every day | NASAL | 12 refills | Status: DC

## 2017-10-30 MED ORDER — IPRATROPIUM BROMIDE 0.03 % NA SOLN: 2.0000 | Freq: Two times a day (BID) | NASAL | 12 refills | Status: DC

## 2017-10-30 NOTE — ED Triage Notes (Signed)
The patient presented to the [facility] with a complaint of sinus pain and pressure x 4 days.

## 2017-10-30 NOTE — ED Provider Notes (Signed)
[facility]    CSN: [id number] Arrival date & time: 10/30/17  1052     History   Chief Complaint Chief Complaint  Patient presents with  . Facial Pain    HPI Amanda Ross is a 72 y.o. female history of hypertension, asthma presenting today for evaluation of coughing and congestion.  Patient has had symptoms for approximately 4 days.  Also having headache off and on.  Has taken Claritin and Flonase at different times without relief.  Denies any fevers.  Eating and drinking like normal, no nausea or vomiting.   HPI  Past Medical History:  Diagnosis Date  . Asthma   . GERD (gastroesophageal reflux disease)   . H/O bladder infections   . H/O measles   . H/O varicella   . Headache(784.0)    Frequently  . Hypertension   . OSA (obstructive sleep apnea)   . Osteoarthritis   . Yeast infection     Patient Active Problem List   Diagnosis Date Noted  . Trochanteric bursitis of both hips 07/30/2017  . Sjogren's syndrome ([place]) 05/22/2016  . Sicca syndrome, unspecified ([place]) 05/22/2016  . High risk medication use 05/22/2016  . Primary hyperparathyroidism ([place]) 07/09/2013  . Left thyroid nodule 07/06/2013  . Dyspnea 10/11/2010    Past Surgical History:  Procedure Laterality Date  . BUNIONECTOMY    . CARPAL TUNNEL RELEASE    . TOTAL SHOULDER ARTHROPLASTY      OB History    Gravida  4   Para  4   Term  3   Preterm  1   AB      Living  4     SAB      TAB      Ectopic      Multiple      Live Births  4            Home Medications    Prior to Admission medications   Medication Sig Start Date End Date Taking? Authorizing Provider  aspirin 81 MG tablet Take 81 mg by mouth daily.    [provider]  baclofen (LIORESAL) 10 MG tablet Take 10 mg by mouth daily.  06/08/14   [provider]  benzonatate (TESSALON) 200 MG capsule Take 1 capsule (200 mg total) by mouth every 8 (eight) hours. 10/30/17   Wieters, Hallie C, PA-C    Cetirizine HCl 10 MG CAPS Take 1 capsule (10 mg total) by mouth daily for 15 days. 10/30/17 11/14/17  Wieters, Hallie C, PA-C  Cholecalciferol (VITAMIN D) 1000 UNITS capsule Take 2,000 Units by mouth daily.     [provider]  diclofenac sodium (VOLTAREN) 1 % GEL Voltaren Gel 3 grams to 3 large joints upto TID 3 TUBES with 3 refills 11/23/16   Panwala, Naitik, PA-C  hydroxychloroquine (PLAQUENIL) 200 MG tablet TAKE 1 TABLET(200 MG) BY MOUTH EVERY MORNING 08/02/17   Bo Merino, MD  ipratropium (ATROVENT) 0.03 % nasal spray Place 2 sprays into both nostrils every 12 (twelve) hours. 10/30/17   Wieters, Hallie C, PA-C  loratadine (CLARITIN) 10 MG tablet Take 10 mg by mouth daily.    [provider]  losartan-hydrochlorothiazide (HYZAAR) 50-12.5 MG per tablet Take 1 tablet by mouth daily.    [provider]  magnesium gluconate (MAGONATE) 500 MG tablet Take 500 mg by mouth daily.    [provider]  metaxalone (SKELAXIN) 800 MG tablet Take 800 mg by mouth every 8 (eight) hours as  needed. Muscle spasms    [provider]  metoprolol tartrate (LOPRESSOR) 25 MG tablet Take 12.5 mg by mouth 2 (two) times daily.     [provider]  MYRBETRIQ 25 MG TB24 tablet  04/27/16   [provider]  omeprazole (PRILOSEC) 20 MG capsule Take 20 mg by mouth as needed.     [provider]  traMADol (ULTRAM) 50 MG tablet Take 50 mg by mouth every 6 (six) hours as needed.    [provider]  traZODone (DESYREL) 50 MG tablet Take 50 mg by mouth at bedtime.  04/21/14   [provider]  triamcinolone (NASACORT ALLERGY 24HR) 55 MCG/ACT AERO nasal inhaler Place 2 sprays into the nose daily. 10/30/17   Wieters, Hallie C, PA-C  triamcinolone cream (KENALOG) 0.1 %  10/30/16   [provider]    Family History Family History  Problem Relation Age of Onset  . Asthma Daughter   . Heart disease Mother        CHF  . Deep vein  thrombosis Mother   . Rheum arthritis Mother   . Heart disease Brother   . Rheum arthritis Sister     Social History Social History   Tobacco Use  . Smoking status: Former Smoker    Packs/day: 1.00    Years: 40.00    Pack years: 40.00    Types: Cigarettes    Last attempt to quit: 06/18/2005    Years since quitting: 12.3  . Smokeless tobacco: Never Used  Substance Use Topics  . Alcohol use: No  . Drug use: No     Allergies   Ivp dye [iodinated diagnostic agents]; Iohexol; Penicillins; and Percocet [oxycodone-acetaminophen]   Review of Systems Review of Systems  Constitutional: Negative for chills, fatigue and fever.  HENT: Positive for congestion, rhinorrhea and sinus pressure. Negative for ear pain, sore throat and trouble swallowing.   Respiratory: Positive for cough. Negative for chest tightness and shortness of breath.   Cardiovascular: Negative for chest pain.  Gastrointestinal: Negative for abdominal pain, nausea and vomiting.  Musculoskeletal: Negative for myalgias.  Skin: Negative for rash.  Neurological: Positive for headaches. Negative for dizziness and light-headedness.     Physical Exam Triage Vital Signs ED Triage Vitals  Enc Vitals Group     BP 10/30/17 1104 131/70     Pulse Rate 10/30/17 1104 70     Resp 10/30/17 1104 18     Temp 10/30/17 1104 98.1 F (36.7 C)     Temp Source 10/30/17 1104 Oral     SpO2 10/30/17 1104 96 %     Weight --      Height --      Head Circumference --      Peak Flow --      Pain Score 10/30/17 1102 6     Pain Loc --      Pain Edu? --      Excl. in [place]? --    No data found.  Updated Vital Signs BP 131/70 (BP Location: Left Arm)   Pulse 70   Temp 98.1 F (36.7 C) (Oral)   Resp 18   SpO2 96%   Visual Acuity Right Eye Distance:   Left Eye Distance:   Bilateral Distance:    Right Eye Near:   Left Eye Near:    Bilateral Near:     Physical Exam  Constitutional: She appears well-developed and well-nourished.  No distress.  HENT:  Head: Normocephalic and atraumatic.  Bilateral ears without tenderness to palpation of external auricle, tragus and mastoid, EAC's without erythema or swelling, TM's with good bony landmarks and cone of light. Non erythematous.  Nasal mucosa erythematous with swollen turbinates, rhinorrhea present in bilateral nares.  Oral mucosa pink and moist, no tonsillar enlargement or exudate. Posterior pharynx patent and mildly erythematous, no uvula deviation or swelling. Normal phonation.   Eyes: Conjunctivae are normal.  Neck: Neck supple.  Cardiovascular: Normal rate and regular rhythm.  No murmur heard. Pulmonary/Chest: Effort normal and breath sounds normal. No respiratory distress.  Breathing comfortably at rest, CTABL, no wheezing, rales or other adventitious sounds auscultated    Abdominal: Soft. There is no tenderness.  Musculoskeletal: She exhibits no edema.  Neurological: She is alert.  Skin: Skin is warm and dry.  Psychiatric: She has a normal mood and affect.  Nursing note and vitals reviewed.    UC Treatments / Results  Labs (all labs ordered are listed, but only abnormal results are displayed) Labs Reviewed - No data to display  EKG None  Radiology No results found.  Procedures Procedures (including critical care time)  Medications Ordered in UC Medications - No data to display  Initial Impression / Assessment and Plan / UC Course  I have reviewed the triage vital signs and the nursing notes.  Pertinent labs & imaging results that were available during my care of the patient were reviewed by me and considered in my medical decision making (see chart for details).     Patient with URI symptoms for 4 days, vital signs stable without fever, tachycardia and oxygen 96%.  Exam unremarkable.  Symptoms likely viral.  Will recommend to continue symptomatic management.  Recommendations below. Discussed strict return precautions. Patient verbalized  understanding and is agreeable with plan.  Final Clinical Impressions(s) / UC Diagnoses   Final diagnoses:  Viral URI with cough     Discharge Instructions     For congestion please continue daily and Claritin, may also try daily Zyrtec which I sent in.  Please pare with Nasacort and Atrovent nasal spray or take an oral decongestant like Mucinex.  Sudafed may increase your blood pressure.  For cough please use Tessalon as needed.  Please return if symptoms not improving in 1 week, return sooner if symptoms worsening, developing fever, difficulty breathing, shortness of breath or general worsening of symptoms.   ED Prescriptions    Medication Sig Dispense Auth. Provider   Cetirizine HCl 10 MG CAPS Take 1 capsule (10 mg total) by mouth daily for 15 days. 15 capsule Wieters, Hallie C, PA-C   ipratropium (ATROVENT) 0.03 % nasal spray Place 2 sprays into both nostrils every 12 (twelve) hours. 30 mL Wieters, Hallie C, PA-C   triamcinolone (NASACORT ALLERGY 24HR) 55 MCG/ACT AERO nasal inhaler Place 2 sprays into the nose daily. 1 Inhaler Wieters, Hallie C, PA-C   benzonatate (TESSALON) 200 MG capsule Take 1 capsule (200 mg total) by mouth every 8 (eight) hours. 28 capsule Wieters, Hallie C, PA-C     Controlled Substance Prescriptions [place] Controlled Substance Registry consulted? Not Applicable   Janith Lima, [place] 10/30/17 1221

## 2017-10-30 NOTE — Discharge Instructions (Signed)
For congestion please continue daily and Claritin, may also try daily Zyrtec which I sent in.  Please pare with Nasacort and Atrovent nasal spray or take an oral decongestant like Mucinex.  Sudafed may increase your blood pressure.  For cough please use Tessalon as needed.  Please return if symptoms not improving in 1 week, return sooner if symptoms worsening, developing fever, difficulty breathing, shortness of breath or general worsening of symptoms.

## 2017-10-30 NOTE — Telephone Encounter (Addendum)
Last Visit: 08/05/17 Next Visit: 03/31/18 Labs: 07/30/17 Calcium is elevated Creatinine continues to be elevated. We will continue to monitor PLQ Eye Exam: 07/2016 WNL  Left message to advise patient we need update PLQ eye exam.  Okay to refill 30 days supply per Dr.Deveshwar

## 2017-11-05 DIAGNOSIS — R221 Localized swelling, mass and lump, neck: Secondary | ICD-10-CM | POA: Diagnosis not present

## 2017-11-15 DIAGNOSIS — I129 Hypertensive chronic kidney disease with stage 1 through stage 4 chronic kidney disease, or unspecified chronic kidney disease: Secondary | ICD-10-CM | POA: Diagnosis not present

## 2017-11-15 DIAGNOSIS — T23031A Burn of unspecified degree of multiple right fingers (nail), not including thumb, initial encounter: Secondary | ICD-10-CM | POA: Diagnosis not present

## 2017-11-15 DIAGNOSIS — X150XXA Contact with hot stove (kitchen), initial encounter: Secondary | ICD-10-CM | POA: Diagnosis not present

## 2017-11-15 DIAGNOSIS — T310 Burns involving less than 10% of body surface: Secondary | ICD-10-CM | POA: Diagnosis not present

## 2017-11-25 ENCOUNTER — Other Ambulatory Visit: Payer: Self-pay | Admitting: Rheumatology

## 2017-11-25 NOTE — Telephone Encounter (Addendum)
Last Visit: 08/05/17 Next Visit: 03/31/18 Labs: 07/30/17 Calcium is elevated Creatinine continues to be elevated. We will continue to monitor PLQ Eye Exam: 07/2016 WNL  Patient will have updated PLQ eye exam sent to our office.  Okay to refill 30 day supply per Dr. Estanislado Pandy

## 2018-01-17 ENCOUNTER — Other Ambulatory Visit: Payer: Self-pay

## 2018-01-24 ENCOUNTER — Other Ambulatory Visit: Payer: Self-pay | Admitting: Rheumatology

## 2018-01-24 DIAGNOSIS — Z79899 Other long term (current) drug therapy: Secondary | ICD-10-CM

## 2018-01-24 NOTE — Telephone Encounter (Signed)
Last visit: 10/29/2017 Next visit: 03/31/2018 Labs: 07/30/2017 Calcium is elevated. Creatinine continues to be elevated. Please advise her to avoid NSAIDs. We will continue to monitor.  Eye exam: 08/06/2017  Advised patient she is due to update labs, patient verbalized understanding. Lab orders have been placed.   Okay to refill 30 day supply, per Dr. Estanislado Pandy.

## 2018-01-28 ENCOUNTER — Other Ambulatory Visit: Payer: Self-pay

## 2018-01-28 DIAGNOSIS — Z79899 Other long term (current) drug therapy: Secondary | ICD-10-CM | POA: Diagnosis not present

## 2018-01-29 LAB — COMPLETE METABOLIC PANEL WITH GFR
AG Ratio: 1.5 (calc) (ref 1.0–2.5)
ALKALINE PHOSPHATASE (APISO): 82 U/L (ref 33–130)
ALT: 15 U/L (ref 6–29)
AST: 14 U/L (ref 10–35)
Albumin: 3.8 g/dL (ref 3.6–5.1)
BUN / CREAT RATIO: 16 (calc) (ref 6–22)
BUN: 19 mg/dL (ref 7–25)
CO2: 31 mmol/L (ref 20–32)
CREATININE: 1.17 mg/dL — AB (ref 0.60–0.93)
Calcium: 10.7 mg/dL — ABNORMAL HIGH (ref 8.6–10.4)
Chloride: 105 mmol/L (ref 98–110)
GFR, Est African American: 54 mL/min/{1.73_m2} — ABNORMAL LOW (ref >=60)
GFR, Est Non African American: 47 mL/min/{1.73_m2} — ABNORMAL LOW (ref >=60)
GLUCOSE: 99 mg/dL (ref 65–99)
Globulin: 2.6 g/dL (calc) (ref 1.9–3.7)
Potassium: 4.4 mmol/L (ref 3.5–5.3)
Sodium: 140 mmol/L (ref 135–146)
Total Bilirubin: 0.5 mg/dL (ref 0.2–1.2)
Total Protein: 6.4 g/dL (ref 6.1–8.1)

## 2018-01-29 LAB — CBC WITH DIFFERENTIAL/PLATELET
BASOS PCT: 1 %
Basophils Absolute: 74 cells/uL (ref 0–200)
Eosinophils Absolute: 400 cells/uL (ref 15–500)
Eosinophils Relative: 5.4 %
HEMATOCRIT: 38.1 % (ref 35.0–45.0)
HEMOGLOBIN: 12.7 g/dL (ref 11.7–15.5)
LYMPHS ABS: 1931 {cells}/uL (ref 850–3900)
MCH: 29.8 pg (ref 27.0–33.0)
MCHC: 33.3 g/dL (ref 32.0–36.0)
MCV: 89.4 fL (ref 80.0–100.0)
MPV: 12.2 fL (ref 7.5–12.5)
Monocytes Relative: 7.7 %
NEUTROS ABS: 4425 {cells}/uL (ref 1500–7800)
Neutrophils Relative %: 59.8 %
Platelets: 209 10*3/uL (ref 140–400)
RBC: 4.26 10*6/uL (ref 3.80–5.10)
RDW: 12.8 % (ref 11.0–15.0)
Total Lymphocyte: 26.1 %
WBC: 7.4 10*3/uL (ref 3.8–10.8)
WBCMIX: 570 {cells}/uL (ref 200–950)

## 2018-02-18 ENCOUNTER — Other Ambulatory Visit: Payer: Self-pay | Admitting: Rheumatology

## 2018-02-18 NOTE — Telephone Encounter (Signed)
Last visit: 10/29/2017 Next visit: 03/31/2018 Labs: 01/28/18 CBC WNL. Creatinine and GFR stable. Calcium is elevated.  Eye exam: 08/06/2017  Okay to refill per Dr. Estanislado Pandy

## 2018-02-19 DIAGNOSIS — R21 Rash and other nonspecific skin eruption: Secondary | ICD-10-CM | POA: Diagnosis not present

## 2018-02-19 DIAGNOSIS — E782 Mixed hyperlipidemia: Secondary | ICD-10-CM | POA: Diagnosis not present

## 2018-02-19 DIAGNOSIS — Z23 Encounter for immunization: Secondary | ICD-10-CM | POA: Diagnosis not present

## 2018-02-19 DIAGNOSIS — R7309 Other abnormal glucose: Secondary | ICD-10-CM | POA: Diagnosis not present

## 2018-02-19 DIAGNOSIS — N183 Chronic kidney disease, stage 3 (moderate): Secondary | ICD-10-CM | POA: Diagnosis not present

## 2018-02-19 DIAGNOSIS — R202 Paresthesia of skin: Secondary | ICD-10-CM | POA: Diagnosis not present

## 2018-02-19 DIAGNOSIS — I1310 Hypertensive heart and chronic kidney disease without heart failure, with stage 1 through stage 4 chronic kidney disease, or unspecified chronic kidney disease: Secondary | ICD-10-CM | POA: Diagnosis not present

## 2018-02-19 DIAGNOSIS — E559 Vitamin D deficiency, unspecified: Secondary | ICD-10-CM | POA: Diagnosis not present

## 2018-02-19 DIAGNOSIS — Z1231 Encounter for screening mammogram for malignant neoplasm of breast: Secondary | ICD-10-CM

## 2018-02-19 DIAGNOSIS — M2550 Pain in unspecified joint: Secondary | ICD-10-CM | POA: Diagnosis not present

## 2018-02-19 DIAGNOSIS — Z1331 Encounter for screening for depression: Secondary | ICD-10-CM | POA: Diagnosis not present

## 2018-02-19 LAB — BASIC METABOLIC PANEL
BUN: 21 (ref 4–21)
Creatinine: 1.1 (ref 0.5–1.1)
Potassium: 4 (ref 3.4–5.3)
SODIUM: 147 (ref 137–147)

## 2018-02-19 LAB — TSH: TSH: 0.64 (ref 0.41–5.90)

## 2018-02-19 LAB — CBC AND DIFFERENTIAL
HEMATOCRIT: 37 (ref 36–46)
HEMOGLOBIN: 12.6 (ref 12.0–16.0)
WBC: 8

## 2018-02-19 LAB — HEMOGLOBIN A1C: HEMOGLOBIN A1C: 5.5

## 2018-02-19 LAB — HEPATIC FUNCTION PANEL
ALT: 14 (ref 7–35)
AST: 16 (ref 13–35)
Alkaline Phosphatase: 84 (ref 25–125)

## 2018-02-25 ENCOUNTER — Ambulatory Visit (INDEPENDENT_AMBULATORY_CARE_PROVIDER_SITE_OTHER): Payer: Medicare Other

## 2018-02-25 ENCOUNTER — Encounter (HOSPITAL_COMMUNITY): Payer: Self-pay | Admitting: Emergency Medicine

## 2018-02-25 ENCOUNTER — Ambulatory Visit (HOSPITAL_COMMUNITY)
Admission: EM | Admit: 2018-02-25 | Discharge: 2018-02-25 | Disposition: A | Discharge status: Home / Self Care | Payer: Medicare Other | Attending: Family Medicine | Admitting: Family Medicine

## 2018-02-25 DIAGNOSIS — R222 Localized swelling, mass and lump, trunk: Secondary | ICD-10-CM

## 2018-02-25 DIAGNOSIS — R0789 Other chest pain: Secondary | ICD-10-CM | POA: Diagnosis not present

## 2018-02-25 DIAGNOSIS — M542 Cervicalgia: Secondary | ICD-10-CM | POA: Diagnosis not present

## 2018-02-25 DIAGNOSIS — R221 Localized swelling, mass and lump, neck: Secondary | ICD-10-CM

## 2018-02-25 DIAGNOSIS — S299XXA Unspecified injury of thorax, initial encounter: Secondary | ICD-10-CM | POA: Diagnosis not present

## 2018-02-25 MED ORDER — PREDNISONE 10 MG (21) PO TBPK: ORAL_TABLET | ORAL | 0 refills | Status: DC

## 2018-02-25 NOTE — Discharge Instructions (Addendum)
It was nice meeting you!!  I believe this is some sort of inflammatory process.  I am going to prescribe some prednisone a steroid to see if this helps.  Start this today.  Your EKG didn't show anything worrisome today.  X ray normal If not better or worse in the morning please call your rheumatologist or primary care provider. If worse to include increased swelling, pain, fever, SOB please go to the ER.
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
## 2018-03-05 ENCOUNTER — Other Ambulatory Visit: Payer: Self-pay | Admitting: Internal Medicine

## 2018-03-05 ENCOUNTER — Encounter: Payer: Self-pay | Admitting: Internal Medicine

## 2018-03-05 DIAGNOSIS — E559 Vitamin D deficiency, unspecified: Secondary | ICD-10-CM | POA: Insufficient documentation

## 2018-03-05 DIAGNOSIS — E782 Mixed hyperlipidemia: Secondary | ICD-10-CM

## 2018-03-05 DIAGNOSIS — Z1231 Encounter for screening mammogram for malignant neoplasm of breast: Secondary | ICD-10-CM

## 2018-03-05 DIAGNOSIS — M2550 Pain in unspecified joint: Secondary | ICD-10-CM | POA: Insufficient documentation

## 2018-03-05 DIAGNOSIS — R7309 Other abnormal glucose: Secondary | ICD-10-CM

## 2018-03-05 DIAGNOSIS — I1310 Hypertensive heart and chronic kidney disease without heart failure, with stage 1 through stage 4 chronic kidney disease, or unspecified chronic kidney disease: Secondary | ICD-10-CM | POA: Insufficient documentation

## 2018-03-17 NOTE — Progress Notes (Signed)
Office Visit Note  Patient: Amanda Ross             Date of Birth: October 03, 1945           MRN: [id number]             PCP: [name], MD Referring: [name], MD Visit Date: 03/31/2018 Occupation: @GUAROCC @  Subjective:  Neck pain   History of Present Illness: Amanda Ross is a 72 y.o. female with history of Sjogren's syndrome.  She is on plaquenil 200 mg 1 tablet by mouth daily.  Patient denies missing doses recently.  She continues to tolerate Plaquenil. She continues to have sicca symptoms. She states that she was bending down to tie her shoes and throughout her back today.  She takes baclofen as needed for muscle spasms.  She states that she has been having increased pain and stiffness in her neck.  She denies any symptoms of radiculopathy.  She states that at times she has pain at night.  She states that she needs an extraction of several teeth and feels as some of her pain may be radiating from the dental caries. She states that in the past she had a right rotator cuff repair performed by Dr. Lorin Ross about 3 years ago.  Patient reports that she would like to see Dr. Lorin Ross for evaluation of neck pain.  She reports that she feels on a regular basis and experiences pain after bowling.  She denies any other joint pain or joint swelling at this time.      Activities of Daily Living:  Patient reports morning stiffness for 30 minutes.   Patient Reports nocturnal pain.  Difficulty dressing/grooming: Denies Difficulty climbing stairs: Reports Difficulty getting out of chair: Denies Difficulty using hands for taps, buttons, cutlery, and/or writing: Denies  Review of Systems  Constitutional: Positive for fatigue.  HENT: Positive for mouth dryness. Negative for mouth sores and nose dryness.   Eyes: Positive for dryness. Negative for pain and visual disturbance.  Respiratory: Negative for cough, hemoptysis, shortness of breath and difficulty breathing.   Cardiovascular:  Positive for swelling in legs/feet. Negative for chest pain, palpitations and hypertension.  Gastrointestinal: Negative for blood in stool, constipation and diarrhea.  Endocrine: Negative for increased urination.  Genitourinary: Negative for painful urination.  Musculoskeletal: Positive for arthralgias, joint pain, myalgias (Muscle spasms ), morning stiffness and myalgias (Muscle spasms ). Negative for joint swelling, muscle weakness and muscle tenderness.  Skin: Negative for color change, pallor, rash, hair loss, nodules/bumps, skin tightness, ulcers and sensitivity to sunlight.  Allergic/Immunologic: Negative for susceptible to infections.  Neurological: Negative for dizziness, numbness, headaches and weakness.  Hematological: Negative for swollen glands.  Psychiatric/Behavioral: Positive for depressed mood. Negative for sleep disturbance. The patient is not nervous/anxious.     PMFS History:  Patient Active Problem List   Diagnosis Date Noted  . Vitamin D deficiency, unspecified 03/05/2018  . Mixed hyperlipidemia 03/05/2018  . 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 Pain in unspecified joint 03/05/2018  . Abnormal glucose 03/05/2018  . Trochanteric bursitis of both hips 07/30/2017  . Sjogren's syndrome ([place]) 05/22/2016  . Sicca syndrome, unspecified ([place]) 05/22/2016  . High risk medication use 05/22/2016  . Primary hyperparathyroidism ([place]) 07/09/2013  . Left thyroid nodule 07/06/2013  . Dyspnea 10/11/2010    Past Medical History:  Diagnosis Date  . Asthma   . GERD (gastroesophageal reflux disease)   . H/O bladder infections   . H/O measles   .  H/O varicella   . Headache(784.0)    Frequently  . Hypertension   . OSA (obstructive sleep apnea)   . Osteoarthritis   . Yeast infection     Family History  Problem Relation Age of Onset  . Asthma Daughter   . Heart disease Mother        CHF  . Deep vein thrombosis Mother   . Rheum arthritis Mother   . Heart  disease Brother   . Rheum arthritis Sister    Past Surgical History:  Procedure Laterality Date  . BUNIONECTOMY    . CARPAL TUNNEL RELEASE    . TOTAL SHOULDER ARTHROPLASTY     Social History   Social History Narrative  . Not on file    Objective: Vital Signs: BP 139/75 (BP Location: Left Arm, Patient Position: Sitting, Cuff Size: Large)   Pulse 79   Resp 16   Ht 5\' 3"  (1.6 m)   Wt 231 lb 12.8 oz (105.1 kg)   BMI 41.06 kg/m    Physical Exam  Constitutional: She is oriented to person, place, and time. She appears well-developed and well-nourished.  HENT:  Head: Normocephalic and atraumatic.  Eyes: Conjunctivae and EOM are normal.  Neck: Normal range of motion.  Cardiovascular: Normal rate, regular rhythm, normal heart sounds and intact distal pulses.  Pulmonary/Chest: Effort normal and breath sounds normal.  Abdominal: Soft. Bowel sounds are normal.  Lymphadenopathy:    She has no cervical adenopathy.  Neurological: She is alert and oriented to person, place, and time.  Skin: Skin is warm and dry. Capillary refill takes less than 2 seconds.  Psychiatric: She has a normal mood and affect. Her behavior is normal.  Nursing note and vitals reviewed.    Musculoskeletal Exam: C-spine limited range of motion with discomfort.  Thoracic and lumbar spine good range of motion.  No midline spinal tenderness.  No SI joint tenderness.  Full range of motion bilateral shoulders.  Edema and tenderness in right supraclavicular fossa. She has discomfort with right shoulder range of motion.  Elbow joints, wrist joints, MCPs, PIPs, DIPs good range of motion with no synovitis.  She has complete fist formation bilaterally.  Hip joints, ankle joints, MTPs, PIPs, DIPs good range of motion with no synovitis.  No warmth or effusion bilateral knee joints.  She has limited extension of the left knee joint.  She has tenderness of bilateral trochanteric bursa.  She has pedal edema bilaterally.  CDAI  Exam: CDAI Score: Not documented Patient Global Assessment: Not documented; Provider Global Assessment: Not documented Swollen: Not documented; Tender: Not documented Joint Exam   Not documented   There is currently no information documented on the homunculus. Go to the Rheumatology activity and complete the homunculus joint exam.  Investigation: No additional findings.  Imaging: No results found.  Recent Labs: Lab Results  Component Value Date   WBC 8.0 02/19/2018   HGB 12.6 02/19/2018   PLT 209 01/28/2018   NA 142 03/19/2018   K 4.2 03/19/2018   CL 105 03/19/2018   CO2 26 03/19/2018   GLUCOSE 92 03/19/2018   BUN 21 03/19/2018   CREATININE 1.24 (H) 03/19/2018   BILITOT 0.3 03/19/2018   ALKPHOS 87 03/19/2018   AST 9 03/19/2018   ALT 16 03/19/2018   PROT 6.5 03/19/2018   ALBUMIN 3.9 03/19/2018   CALCIUM 10.9 (H) 03/19/2018   GFRAA 50 (L) 03/19/2018    Speciality Comments: PLQ eye exam: 08/06/2017 Normal. [facility]. Follow up in  6 months.  Procedures:  No procedures performed Allergies: Ivp dye [iodinated diagnostic agents]; Iohexol; Penicillins; Percocet [oxycodone-acetaminophen]; and Prednisone   Assessment / Plan:     Visit Diagnoses: Sjogren's syndrome with other organ involvement [facility]): She continues to have sicca symptoms.  She is on Plaquenil 200 mg 1 tablet by mouth daily.  She has not missed any doses and is tolerating Plaquenil well.  She has several dental caries and will be scheduled for a tooth extraction when she can afford the procedure.  We discussed the association of dental caries and history of Sjogren's.  She has no parotid swelling on exam.  She had a CT of the neck on 10/09/2017 that did not reveal any inflammation, mass, or sternal salivary glands.  Multiple mandibular dental caries were noted on the CT scan as well.  She was advised to continue on Plaquenil 200 mg 1 tablet by mouth daily.  She does not need any refills at this time.  She  will follow-up in 5 months.  We will continue to monitor labs every 5 months.  High risk medication use - PLQ 200 mg 1 tablet by mouth dailyEye exam: 08/09/2017 by Dr. Cori Razor per patient.  CMP was drawn on 03/19/18 and CBC was drawn on 02/19/18.   Trochanteric bursitis of both hips: She has tenderness of bilateral trochanteric bursa.  Primary hyperparathyroidism (HCC)  DDD (degenerative disc disease), cervical -She has limited range of motion of her C-spine.  She is been having increased discomfort and stiffness.  She has not been experiencing any symptoms of radiculopathy recently.  She has edema and tenderness in the right supraclavicular fossa.  She was evaluated in the ED on 02/25/18 and was prescribed prednisone which she could not tolerate and discontinued. Upon reviewing the CT of the neck on 10/09/2017 there is evidence of advanced multilevel cervical disc degeneration and predominantly left-sided facet degeneration.  Likely moderate spinal stenosis at C6-C7 and moderate to severe neural foraminal stenosis at multiple levels as noted.  Patient requested a referral to Dr. Lorin Ross who she is seen in the past for further evaluation.  Plan: Ambulatory referral to Orthopedic Surgery  Spinal stenosis of cervical region -Patient requested referral to be further evaluated by Dr. Lorin Ross.  Plan: Ambulatory referral to Orthopedic Surgery   Orders: Orders Placed This Encounter  Procedures  . Ambulatory referral to Orthopedic Surgery   No orders of the defined types were placed in this encounter.   Face-to-face time spent with patient was 30 minutes. Greater than 50% of time was spent in counseling and coordination of care.  Follow-Up Instructions: Return in about 5 months (around 08/30/2018) for Sjogren's syndrome.   Ofilia Neas, PA-C  Note - This record has been created using Dragon software.  Chart creation errors have been sought, but may not always  have been located. Such creation errors do not  reflect on  the standard of medical care.

## 2018-03-19 ENCOUNTER — Encounter: Payer: Self-pay | Admitting: Internal Medicine

## 2018-03-19 ENCOUNTER — Ambulatory Visit (INDEPENDENT_AMBULATORY_CARE_PROVIDER_SITE_OTHER): Payer: Medicare Other | Admitting: Internal Medicine

## 2018-03-19 VITALS — BP 120/80 | HR 88 | Temp 98.2°F | Ht 63.5 in | Wt 229.0 lb

## 2018-03-19 DIAGNOSIS — N3941 Urge incontinence: Secondary | ICD-10-CM | POA: Diagnosis not present

## 2018-03-19 DIAGNOSIS — R252 Cramp and spasm: Secondary | ICD-10-CM | POA: Diagnosis not present

## 2018-03-19 DIAGNOSIS — I10 Essential (primary) hypertension: Secondary | ICD-10-CM | POA: Diagnosis not present

## 2018-03-19 DIAGNOSIS — Z23 Encounter for immunization: Secondary | ICD-10-CM | POA: Diagnosis not present

## 2018-03-19 MED ORDER — MIRABEGRON ER 50 MG PO TB24: 50.0000 mg | ORAL_TABLET | Freq: Every day | ORAL | 2 refills | Status: DC

## 2018-03-19 NOTE — Progress Notes (Addendum)
Subjective:     Patient ID: Amanda Ross, female   DOB: 01-Dec-1945, 72 y.o.   MRN: [id number] HPI 1-Here to have parathyroid test done. Has been having intermittent hands drawing for 1 y.   2-Urge incontinence is getting worse and she increased her Myrbetric to 11/2 a day. Saw Uro PT x 1, but was never called back 2 ys ago, and a urologist several years ago. Spends a lot of money on depends.  Review of Systems  Constitutional: Positive for diaphoresis. Negative for chills and fever.  Respiratory: Negative for chest tightness.   Cardiovascular: Negative for chest pain and palpitations.       Has had intermittent L under breast dull pain that last up to 1 minutes and comes back. Nothing makes it worse or better x 6 months. Denies SOB or sweaty  Gastrointestinal: Negative for constipation, nausea and vomiting.  Endocrine: Negative for cold intolerance and heat intolerance.  Genitourinary: Positive for frequency and urgency. Negative for dysuria and hematuria.       Stress incontinence    Past Medical History:  Diagnosis Date  . Asthma   . GERD (gastroesophageal reflux disease)   . H/O bladder infections   . H/O measles   . H/O varicella   . Headache(784.0)    Frequently  . Hypertension   . OSA (obstructive sleep apnea)   . Osteoarthritis   . Yeast infection   ALLERGIES  Ivp dye [iodinated diagnostic agents]; Iohexol; Penicillins; Percocet [oxycodone-acetaminophen]; and Prednisone  Outpatient Medications Prior to Visit  Medication Sig Dispense Refill  . aspirin 81 MG tablet Take 81 mg by mouth daily.    . baclofen (LIORESAL) 10 MG tablet Take 10 mg by mouth daily.   2  . Cholecalciferol (VITAMIN D) 1000 UNITS capsule Take 2,000 Units by mouth daily.     . diclofenac sodium (VOLTAREN) 1 % GEL Voltaren Gel 3 grams to 3 large joints upto TID 3 TUBES with 3 refills 3 Tube 3  . hydroxychloroquine (PLAQUENIL) 200 MG tablet TAKE 1 TABLET(200 MG) BY MOUTH EVERY MORNING 90 tablet 0  .  ipratropium (ATROVENT) 0.03 % nasal spray Place 2 sprays into both nostrils every 12 (twelve) hours. 30 mL 12  . losartan-hydrochlorothiazide (HYZAAR) 50-12.5 MG per tablet Take 1 tablet by mouth daily.    . magnesium gluconate (MAGONATE) 500 MG tablet Take 500 mg by mouth daily.    . metoprolol tartrate (LOPRESSOR) 25 MG tablet Take 12.5 mg by mouth 2 (two) times daily.     Marland Kitchen MYRBETRIQ 25 MG TB24 tablet   5  . omeprazole (PRILOSEC) 20 MG capsule Take 20 mg by mouth as needed.     . traZODone (DESYREL) 50 MG tablet Take 50 mg by mouth at bedtime.   0  . benzonatate (TESSALON) 200 MG capsule Take 1 capsule (200 mg total) by mouth every 8 (eight) hours. 28 capsule 0  . Cetirizine HCl 10 MG CAPS Take 1 capsule (10 mg total) by mouth daily for 15 days. 15 capsule 0  . loratadine (CLARITIN) 10 MG tablet Take 10 mg by mouth daily.    . metaxalone (SKELAXIN) 800 MG tablet Take 800 mg by mouth every 8 (eight) hours as needed. Muscle spasms    . predniSONE (STERAPRED UNI-PAK 21 TAB) 10 MG (21) TBPK tablet 6 tabs for 1 day, then 5 tabs for 1 das, then 4 tabs for 1 day, then 3 tabs for 1 day, 2 tabs for 1 day, then  1 tab for 1 day 21 tablet 0  . traMADol (ULTRAM) 50 MG tablet Take 50 mg by mouth every 6 (six) hours as needed.    . triamcinolone (NASACORT ALLERGY 24HR) 55 MCG/ACT AERO nasal inhaler Place 2 sprays into the nose daily. 1 Inhaler 12  . triamcinolone cream (KENALOG) 0.1 %   2   No facility-administered medications prior to visit.       Objective:   Physical Exam  Constitutional: She appears well-developed and well-nourished.  HENT:  Head: Normocephalic.  Right Ear: External ear normal.  Left Ear: External ear normal.  Nose: Nose normal.  Eyes: Conjunctivae are normal.  Neck: Normal range of motion. Neck supple. No thyromegaly present.  Cardiovascular: Normal rate and regular rhythm.  No murmur heard. Pulmonary/Chest: Effort normal and breath sounds normal.  Musculoskeletal: She  exhibits no edema.  Lymphadenopathy:    She has no cervical adenopathy.  Skin: Capillary refill takes less than 2 seconds. No erythema.  Psychiatric: She has a normal mood and affect. Her behavior is normal. Judgment and thought content normal.   Today's Vitals   03/19/18 1235  BP: 120/80  Pulse: 88  Temp: 98.2 F (36.8 C)  TempSrc: Oral  SpO2: 94%  Weight: 229 lb (103.9 kg)   Body mass index is 39.93 kg/m.    Assessment:     1- Hypercalcemia 2- Stress urge incontinence 3- HTN- stable. 4- Hand cramps    Plan:     UA was not ran since I forgot to place an order, and was thrown away so she will come in 2 weeks to get labs done and leave [place] a urine.  I ordered pelvic PT. I placed a referral to urology.  I ordered PTH and CMP  May continue same medication.  I increased her Myrbetric to 50 mg qd, and told her if her creat. Is high, she wont be able to increase it. She understands.  FU as scheduled

## 2018-03-20 ENCOUNTER — Telehealth: Payer: Self-pay | Admitting: Internal Medicine

## 2018-03-20 ENCOUNTER — Other Ambulatory Visit: Payer: Self-pay | Admitting: Internal Medicine

## 2018-03-20 ENCOUNTER — Encounter: Payer: Self-pay | Admitting: Internal Medicine

## 2018-03-20 DIAGNOSIS — R7989 Other specified abnormal findings of blood chemistry: Secondary | ICD-10-CM

## 2018-03-20 DIAGNOSIS — R3915 Urgency of urination: Secondary | ICD-10-CM

## 2018-03-20 DIAGNOSIS — R799 Abnormal finding of blood chemistry, unspecified: Secondary | ICD-10-CM

## 2018-03-20 LAB — CMP14 + ANION GAP
ALBUMIN: 3.9 g/dL (ref 3.5–4.8)
ALT: 16 IU/L (ref 0–32)
AST: 9 IU/L (ref 0–40)
Albumin/Globulin Ratio: 1.5 (ref 1.2–2.2)
Alkaline Phosphatase: 87 IU/L (ref 39–117)
Anion Gap: 11 mmol/L (ref 10.0–18.0)
BILIRUBIN TOTAL: 0.3 mg/dL (ref 0.0–1.2)
BUN/Creatinine Ratio: 17 (ref 12–28)
BUN: 21 mg/dL (ref 8–27)
CALCIUM: 10.9 mg/dL — AB (ref 8.7–10.3)
CHLORIDE: 105 mmol/L (ref 96–106)
CO2: 26 mmol/L (ref 20–29)
Creatinine, Ser: 1.24 mg/dL — ABNORMAL HIGH (ref 0.57–1.00)
GFR calc Af Amer: 50 mL/min/{1.73_m2} — ABNORMAL LOW (ref >59)
GFR calc non Af Amer: 43 mL/min/{1.73_m2} — ABNORMAL LOW (ref >59)
GLUCOSE: 92 mg/dL (ref 65–99)
Globulin, Total: 2.6 g/dL (ref 1.5–4.5)
Potassium: 4.2 mmol/L (ref 3.5–5.2)
Sodium: 142 mmol/L (ref 134–144)
TOTAL PROTEIN: 6.5 g/dL (ref 6.0–8.5)

## 2018-03-20 LAB — PTH, INTACT AND CALCIUM: PTH: 69 pg/mL — ABNORMAL HIGH (ref 15–65)

## 2018-03-20 MED ORDER — OLMESARTAN MEDOXOMIL 40 MG PO TABS: 20.0000 mg | ORAL_TABLET | Freq: Every day | ORAL | 0 refills | Status: DC

## 2018-03-20 NOTE — Telephone Encounter (Signed)
Please have her do a UA when she comes in for blood work to check her kidney function in 2 weeks.

## 2018-03-21 ENCOUNTER — Other Ambulatory Visit: Payer: Self-pay | Admitting: Internal Medicine

## 2018-03-21 DIAGNOSIS — E213 Hyperparathyroidism, unspecified: Secondary | ICD-10-CM

## 2018-03-25 ENCOUNTER — Other Ambulatory Visit: Payer: Self-pay | Admitting: Nurse Practitioner

## 2018-03-25 ENCOUNTER — Other Ambulatory Visit: Payer: Self-pay | Admitting: Rheumatology

## 2018-03-31 ENCOUNTER — Encounter: Payer: Self-pay | Admitting: Physician Assistant

## 2018-03-31 ENCOUNTER — Telehealth: Payer: Self-pay

## 2018-03-31 ENCOUNTER — Ambulatory Visit (INDEPENDENT_AMBULATORY_CARE_PROVIDER_SITE_OTHER): Payer: Medicare Other | Admitting: Physician Assistant

## 2018-03-31 VITALS — BP 139/75 | HR 79 | Resp 16 | Ht 63.0 in | Wt 231.8 lb

## 2018-03-31 DIAGNOSIS — M5030 Other cervical disc degeneration, unspecified cervical region: Secondary | ICD-10-CM

## 2018-03-31 DIAGNOSIS — M3509 Sicca syndrome with other organ involvement: Secondary | ICD-10-CM | POA: Diagnosis not present

## 2018-03-31 DIAGNOSIS — R590 Localized enlarged lymph nodes: Secondary | ICD-10-CM

## 2018-03-31 DIAGNOSIS — M7062 Trochanteric bursitis, left hip: Secondary | ICD-10-CM | POA: Diagnosis not present

## 2018-03-31 DIAGNOSIS — M7061 Trochanteric bursitis, right hip: Secondary | ICD-10-CM

## 2018-03-31 DIAGNOSIS — E210 Primary hyperparathyroidism: Secondary | ICD-10-CM

## 2018-03-31 DIAGNOSIS — M4802 Spinal stenosis, cervical region: Secondary | ICD-10-CM

## 2018-03-31 DIAGNOSIS — Z79899 Other long term (current) drug therapy: Secondary | ICD-10-CM

## 2018-03-31 NOTE — Telephone Encounter (Signed)
Patient called and wanted to let you know that the BP medication that she was put on omeprazole has been giving her headaches and has been making her get nauseas she would like to know if there is something else she could take or just stop PA.

## 2018-04-01 ENCOUNTER — Other Ambulatory Visit: Payer: Self-pay

## 2018-04-01 NOTE — Telephone Encounter (Signed)
I called pt back and she said that her pharmacist called her and told her that it would take a few days for her body to adjust to the new BP med since she was having Has and by day 3, it started improving. Now is very minor

## 2018-04-02 ENCOUNTER — Other Ambulatory Visit (INDEPENDENT_AMBULATORY_CARE_PROVIDER_SITE_OTHER): Payer: Medicare Other | Admitting: Internal Medicine

## 2018-04-02 ENCOUNTER — Other Ambulatory Visit: Payer: Self-pay

## 2018-04-02 DIAGNOSIS — R799 Abnormal finding of blood chemistry, unspecified: Secondary | ICD-10-CM | POA: Diagnosis not present

## 2018-04-02 DIAGNOSIS — R7989 Other specified abnormal findings of blood chemistry: Secondary | ICD-10-CM

## 2018-04-02 DIAGNOSIS — R3915 Urgency of urination: Secondary | ICD-10-CM

## 2018-04-02 LAB — POCT URINALYSIS DIPSTICK
Bilirubin, UA: NEGATIVE
Glucose, UA: NEGATIVE
KETONES UA: NEGATIVE
LEUKOCYTES UA: NEGATIVE
NITRITE UA: NEGATIVE
PH UA: 5.5 (ref 5.0–8.0)
PROTEIN UA: NEGATIVE
Spec Grav, UA: >=1.03 — AB (ref 1.010–1.025)
UROBILINOGEN UA: 0.2 U/dL

## 2018-04-02 LAB — CMP14 + ANION GAP
ALT: 13 IU/L (ref 0–32)
ANION GAP: 12 mmol/L (ref 10.0–18.0)
AST: 10 IU/L (ref 0–40)
Albumin/Globulin Ratio: 1.8 (ref 1.2–2.2)
Albumin: 4.1 g/dL (ref 3.5–4.8)
Alkaline Phosphatase: 83 IU/L (ref 39–117)
BUN/Creatinine Ratio: 14 (ref 12–28)
BUN: 15 mg/dL (ref 8–27)
Bilirubin Total: 0.3 mg/dL (ref 0.0–1.2)
CALCIUM: 10.6 mg/dL — AB (ref 8.7–10.3)
CHLORIDE: 106 mmol/L (ref 96–106)
CO2: 22 mmol/L (ref 20–29)
Creatinine, Ser: 1.06 mg/dL — ABNORMAL HIGH (ref 0.57–1.00)
GFR calc non Af Amer: 53 mL/min/{1.73_m2} — ABNORMAL LOW (ref >59)
GFR, EST AFRICAN AMERICAN: 61 mL/min/{1.73_m2} (ref >59)
GLUCOSE: 102 mg/dL — AB (ref 65–99)
Globulin, Total: 2.3 g/dL (ref 1.5–4.5)
POTASSIUM: 4 mmol/L (ref 3.5–5.2)
Sodium: 140 mmol/L (ref 134–144)
TOTAL PROTEIN: 6.4 g/dL (ref 6.0–8.5)

## 2018-04-02 NOTE — Telephone Encounter (Signed)
Patient came in did a dip-stick results are in the system.

## 2018-04-03 ENCOUNTER — Other Ambulatory Visit: Payer: Self-pay | Admitting: Internal Medicine

## 2018-04-03 DIAGNOSIS — N3941 Urge incontinence: Secondary | ICD-10-CM

## 2018-04-03 MED ORDER — MIRABEGRON ER 25 MG PO TB24: 25.0000 mg | ORAL_TABLET | Freq: Every day | ORAL | 0 refills | Status: DC

## 2018-04-03 NOTE — Progress Notes (Signed)
Med list updated

## 2018-04-22 ENCOUNTER — Ambulatory Visit (INDEPENDENT_AMBULATORY_CARE_PROVIDER_SITE_OTHER): Payer: Medicare Other | Admitting: Orthopaedic Surgery

## 2018-04-22 ENCOUNTER — Encounter (INDEPENDENT_AMBULATORY_CARE_PROVIDER_SITE_OTHER): Payer: Self-pay | Admitting: Orthopaedic Surgery

## 2018-04-22 VITALS — BP 137/78 | HR 66 | Ht 64.0 in | Wt 225.0 lb

## 2018-04-22 DIAGNOSIS — M542 Cervicalgia: Secondary | ICD-10-CM | POA: Diagnosis not present

## 2018-04-22 DIAGNOSIS — M47812 Spondylosis without myelopathy or radiculopathy, cervical region: Secondary | ICD-10-CM | POA: Diagnosis not present

## 2018-04-22 NOTE — Addendum Note (Signed)
Addended by: Mal Misty L on: 04/22/2018 12:52 PM   Modules accepted: Orders

## 2018-04-22 NOTE — Progress Notes (Signed)
Office Visit Note   Patient: Amanda Ross           Date of Birth: 02/04/1946           MRN: [id number] Visit Date: 04/22/2018              Requested by: Ofilia Neas, PA-C [id number] [place], [place] [address] PCP: [name], MD   Assessment & Plan: Visit Diagnoses:  1. Spondylosis without myelopathy or radiculopathy, cervical region     Plan: Recent through and through exercises heating pad, anti-inflammatories, Tylenol, traction.  She is had persistent problems with her neck.  CT scan in April looking for soft tissue problems in her neck incidentally noted significant multilevel spondylitic changes with stenosis at C6-7.  Patient had gait disturbance uses a cane intermittently with balance problems.  I recommend proceeding with an MRI scan to rule out disc herniation with stenosis.  Office follow-up after scan for review.  The CT scan she had did not image well through the cervical spine since her initial problem with soft tissue density this was not a designated cervical CT scan.  I reviewed the images with the patient and images are not adequate to assess the cervical spine problem that she has.  Follow-Up Instructions: No follow-ups on file.   Orders:  No orders of the defined types were placed in this encounter.  No orders of the defined types were placed in this encounter.     Procedures: No procedures performed   Clinical Data: No additional findings.   Subjective: Chief Complaint  Patient presents with  . Neck - Pain    HPI 72 year old female returns with persistent problems with her neck.  She states at times she sometimes has balance problems has more problems with some right leg intermittent weakness than left.  She has pain particularly with rotation of her neck.  Sometimes she states her neck is not as bad and she actually can go bowl and it may not bother her much until later.  She has some weakness left arm more than right arm.  She  is had previous rotator cuff surgery both are doing well.  She denies chills or fever.  No loss of consciousness.  CT scan obtained of her neck to evaluate her for possible nodule noted on ultrasound showed significant multilevel spondylosis with stenosis at least moderate degree at C6-7.  Review of Systems she is systems positive for history of lumbar disc protrusion L4-5 worse in the left than right.  Sica syndrome, Sjogren's syndrome.  Trochanteric bursitis.  Hypertension, carpal tunnel surgery 2014.  Shoulder rotator cuff repair right 2013.  Otherwise negative is a pertains to HPI 14 point systems updated.   Objective: Vital Signs: BP 137/78   Pulse 66   Ht 5\' 4"  (1.626 m)   Wt 225 lb (102.1 kg)   BMI 38.62 kg/m   Physical Exam  Constitutional: She is oriented to person, place, and time. She appears well-developed.  HENT:  Head: Normocephalic.  Right Ear: External ear normal.  Left Ear: External ear normal.  Eyes: Pupils are equal, round, and reactive to light.  Neck: No tracheal deviation present. No thyromegaly present.  Cardiovascular: Normal rate.  Pulmonary/Chest: Effort normal.  Abdominal: Soft.  Neurological: She is alert and oriented to person, place, and time.  Skin: Skin is warm and dry.  Psychiatric: She has a normal mood and affect. Her behavior is normal.    Ortho Exam patient  has brachial plexus tenderness more on the left than right.  No lower extremity hyperreflexia no clonus.  Gait is rated at fair she frequently reaches out with her hand to grab the counter.  She has a cane that she ambulates with but is not using it today.  Slight decreased strength biceps triceps.  Specialty Comments:  No specialty comments available.  Imaging: No results found.   PMFS History: Patient Active Problem List   Diagnosis Date Noted  . Vitamin D deficiency, unspecified 03/05/2018  . Mixed hyperlipidemia 03/05/2018  . 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  Pain in unspecified joint 03/05/2018  . Abnormal glucose 03/05/2018  . Trochanteric bursitis of both hips 07/30/2017  . Sjogren's syndrome ([place]) 05/22/2016  . Sicca syndrome, unspecified ([place]) 05/22/2016  . High risk medication use 05/22/2016  . Primary hyperparathyroidism ([place]) 07/09/2013  . Left thyroid nodule 07/06/2013  . Dyspnea 10/11/2010   Past Medical History:  Diagnosis Date  . Asthma   . GERD (gastroesophageal reflux disease)   . H/O bladder infections   . H/O measles   . H/O varicella   . Headache(784.0)    Frequently  . Hypertension   . OSA (obstructive sleep apnea)   . Osteoarthritis   . Yeast infection     Family History  Problem Relation Age of Onset  . Asthma Daughter   . Heart disease Mother        CHF  . Deep vein thrombosis Mother   . Rheum arthritis Mother   . Heart disease Brother   . Rheum arthritis Sister     Past Surgical History:  Procedure Laterality Date  . BUNIONECTOMY    . CARPAL TUNNEL RELEASE    . TOTAL SHOULDER ARTHROPLASTY     Social History   Occupational History  . 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 Smoking status: Former Smoker    Packs/day: 1.00    Years: 40.00    Pack years: 40.00    Types: Cigarettes    Last attempt to quit: 06/18/2005    Years since quitting: 12.8  . Smokeless tobacco: Never Used  Substance and Sexual Activity  . Alcohol use: No  . Drug use: No  . Sexual activity: Not on file

## 2018-05-17 ENCOUNTER — Ambulatory Visit
Admission: RE | Admit: 2018-05-17 | Discharge: 2018-05-17 | Disposition: A | Discharge status: Home / Self Care | Payer: Medicare Other | Source: Ambulatory Visit | Attending: Orthopaedic Surgery | Admitting: Orthopaedic Surgery

## 2018-05-17 DIAGNOSIS — M542 Cervicalgia: Secondary | ICD-10-CM

## 2018-05-17 DIAGNOSIS — M47812 Spondylosis without myelopathy or radiculopathy, cervical region: Secondary | ICD-10-CM | POA: Diagnosis not present

## 2018-05-17 DIAGNOSIS — M50223 Other cervical disc displacement at C6-C7 level: Secondary | ICD-10-CM | POA: Diagnosis not present

## 2018-05-24 ENCOUNTER — Other Ambulatory Visit: Payer: Self-pay | Admitting: Nurse Practitioner

## 2018-05-26 ENCOUNTER — Other Ambulatory Visit: Payer: Self-pay | Admitting: Nurse Practitioner

## 2018-05-26 DIAGNOSIS — R252 Cramp and spasm: Secondary | ICD-10-CM

## 2018-05-26 MED ORDER — BACLOFEN 10 MG PO TABS: ORAL_TABLET | ORAL | 2 refills | Status: DC

## 2018-05-29 ENCOUNTER — Other Ambulatory Visit: Payer: Self-pay | Admitting: Rheumatology

## 2018-05-29 NOTE — Telephone Encounter (Signed)
Last Visit: 03/31/18 Next Visit: 09/02/18 Labs: 03/23/18 Calcium 10.6 Creat 1.06  CBC 02/19/18 WNL PLQ eye exam: 08/06/2017 Normal.   Okay to refill per Dr. Estanislado Pandy

## 2018-06-23 ENCOUNTER — Other Ambulatory Visit: Payer: Self-pay | Admitting: Nurse Practitioner

## 2018-06-30 ENCOUNTER — Other Ambulatory Visit: Payer: Self-pay | Admitting: Nurse Practitioner

## 2018-07-03 ENCOUNTER — Other Ambulatory Visit: Payer: Self-pay | Admitting: Nurse Practitioner

## 2018-07-03 DIAGNOSIS — G4700 Insomnia, unspecified: Secondary | ICD-10-CM

## 2018-07-03 MED ORDER — TRAZODONE HCL 50 MG PO TABS
50.0000 mg | ORAL_TABLET | Freq: Every day | ORAL | 3 refills | Status: DC
Start: 2018-07-03 — End: 2018-07-03

## 2018-07-03 MED ORDER — TRAZODONE HCL 50 MG PO TABS
50.0000 mg | ORAL_TABLET | Freq: Every day | ORAL | 3 refills | Status: DC
Start: 2018-07-03 — End: 2018-12-29

## 2018-07-15 DIAGNOSIS — R351 Nocturia: Secondary | ICD-10-CM | POA: Diagnosis not present

## 2018-07-15 DIAGNOSIS — R350 Frequency of micturition: Secondary | ICD-10-CM | POA: Diagnosis not present

## 2018-08-19 NOTE — Progress Notes (Deleted)
Office Visit Note  Patient: Amanda Ross             Date of Birth: 1946/03/27           MRN: [id number]             PCP: [name], MD Referring: [name], MD Visit Date: 09/02/2018 Occupation: @GUAROCC @  Subjective:  No chief complaint on file.  Plaquenil 200 mg daily.  Last Plaquenil eye exam normal on 08/06/2017.  Most recent CBC within normal limits on 02/19/2018.  Most recent CMP within normal limits except for slightly elevated creatinine on 04/02/2018.  Due for CBC/CMP today and will monitor every 5 months.  History of Present Illness: EVERLEIGH Ross is a 73 y.o. female ***   Activities of Daily Living:  Patient reports morning stiffness for *** {minute/hour:19697}.   Patient {ACTIONS;DENIES/REPORTS:21021675::"Denies"} nocturnal pain.  Difficulty dressing/grooming: {ACTIONS;DENIES/REPORTS:21021675::"Denies"} Difficulty climbing stairs: {ACTIONS;DENIES/REPORTS:21021675::"Denies"} Difficulty getting out of chair: {ACTIONS;DENIES/REPORTS:21021675::"Denies"} Difficulty using hands for taps, buttons, cutlery, and/or writing: {ACTIONS;DENIES/REPORTS:21021675::"Denies"}  No Rheumatology ROS completed.   PMFS History:  Patient Active Problem List   Diagnosis Date Noted  . Vitamin D deficiency, unspecified 03/05/2018  . Mixed hyperlipidemia 03/05/2018  . 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 Pain in unspecified joint 03/05/2018  . Abnormal glucose 03/05/2018  . Trochanteric bursitis of both hips 07/30/2017  . Sjogren's syndrome ([place]) 05/22/2016  . Sicca syndrome, unspecified ([place]) 05/22/2016  . High risk medication use 05/22/2016  . Primary hyperparathyroidism ([place]) 07/09/2013  . Left thyroid nodule 07/06/2013  . Dyspnea 10/11/2010    Past Medical History:  Diagnosis Date  . Asthma   . GERD (gastroesophageal reflux disease)   . H/O bladder infections   . H/O measles   . H/O varicella   . Headache(784.0)    Frequently  . Hypertension     . OSA (obstructive sleep apnea)   . Osteoarthritis   . Yeast infection     Family History  Problem Relation Age of Onset  . Asthma Daughter   . Heart disease Mother        CHF  . Deep vein thrombosis Mother   . Rheum arthritis Mother   . Heart disease Brother   . Rheum arthritis Sister    Past Surgical History:  Procedure Laterality Date  . BUNIONECTOMY    . CARPAL TUNNEL RELEASE    . TOTAL SHOULDER ARTHROPLASTY     Social History   Social History Narrative  . Not on file   Immunization History  Administered Date(s) Administered  . Influenza Split 03/25/2013  . Pneumococcal-Unspecified 04/11/2012     Objective: Vital Signs: There were no vitals taken for this visit.   Physical Exam   Musculoskeletal Exam: ***  CDAI Exam: CDAI Score: Not documented Patient Global Assessment: Not documented; Provider Global Assessment: Not documented Swollen: Not documented; Tender: Not documented Joint Exam   Not documented   There is currently no information documented on the homunculus. Go to the Rheumatology activity and complete the homunculus joint exam.  Investigation: No additional findings.  Imaging: No results found.  Recent Labs: Lab Results  Component Value Date   WBC 8.0 02/19/2018   HGB 12.6 02/19/2018   PLT 209 01/28/2018   NA 140 04/02/2018   K 4.0 04/02/2018   CL 106 04/02/2018   CO2 22 04/02/2018   GLUCOSE 102 (H) 04/02/2018   BUN 15 04/02/2018   CREATININE 1.06 (H) 04/02/2018   BILITOT 0.3 04/02/2018  ALKPHOS 83 04/02/2018   AST 10 04/02/2018   ALT 13 04/02/2018   PROT 6.4 04/02/2018   ALBUMIN 4.1 04/02/2018   CALCIUM 10.6 (H) 04/02/2018   GFRAA 61 04/02/2018    Speciality Comments: PLQ eye exam: 08/06/2017 Normal. [facility]. Follow up in 6 months.  Procedures:  No procedures performed Allergies: Ivp dye [iodinated diagnostic agents]; Iohexol; Penicillins; Percocet [oxycodone-acetaminophen]; and Prednisone   Assessment /  Plan:     Visit Diagnoses: Sjogren's syndrome with other organ involvement (HCC)  High risk medication use - Plaquenil 200 mg 1 tablet by mouth daily.   Cervical lymphadenopathy  Trochanteric bursitis of both hips  DDD (degenerative disc disease), cervical  Spinal stenosis of cervical region  Primary hyperparathyroidism ([place])   Orders: No orders of the defined types were placed in this encounter.  No orders of the defined types were placed in this encounter.   Face-to-face time spent with patient was *** minutes. Greater than 50% of time was spent in counseling and coordination of care.  Follow-Up Instructions: No follow-ups on file.   Amanda Neas, PA-C  Note - This record has been created using Dragon software.  Chart creation errors have been sought, but may not always  have been located. Such creation errors do not reflect on  the standard of medical care.

## 2018-08-22 ENCOUNTER — Other Ambulatory Visit: Payer: Self-pay | Admitting: Nurse Practitioner

## 2018-08-22 DIAGNOSIS — R252 Cramp and spasm: Secondary | ICD-10-CM

## 2018-08-28 ENCOUNTER — Other Ambulatory Visit: Payer: Self-pay | Admitting: Rheumatology

## 2018-08-29 NOTE — Telephone Encounter (Signed)
Last Visit: 03/31/18 Next Visit: 09/02/18 Labs: 03/23/18 Calcium 10.6 Creat 1.06  CBC 02/19/18 WNL PLQ eye exam: 08/06/2017 Normal.   Okay to refill per Dr. Estanislado Pandy

## 2018-09-02 ENCOUNTER — Ambulatory Visit: Payer: Medicare Other | Admitting: Physician Assistant

## 2018-09-06 ENCOUNTER — Other Ambulatory Visit: Payer: Self-pay | Admitting: Nurse Practitioner

## 2018-09-06 DIAGNOSIS — R252 Cramp and spasm: Secondary | ICD-10-CM

## 2018-09-11 DIAGNOSIS — R350 Frequency of micturition: Secondary | ICD-10-CM | POA: Diagnosis not present

## 2018-09-11 DIAGNOSIS — N3946 Mixed incontinence: Secondary | ICD-10-CM | POA: Diagnosis not present

## 2018-09-21 ENCOUNTER — Other Ambulatory Visit: Payer: Self-pay | Admitting: Internal Medicine

## 2018-09-21 DIAGNOSIS — R252 Cramp and spasm: Secondary | ICD-10-CM

## 2018-10-10 DIAGNOSIS — N3946 Mixed incontinence: Secondary | ICD-10-CM | POA: Diagnosis not present

## 2018-10-10 DIAGNOSIS — R350 Frequency of micturition: Secondary | ICD-10-CM | POA: Diagnosis not present

## 2018-10-11 ENCOUNTER — Other Ambulatory Visit: Payer: Self-pay | Admitting: Nurse Practitioner

## 2018-10-28 ENCOUNTER — Telehealth: Payer: Self-pay

## 2018-10-28 NOTE — Telephone Encounter (Signed)
Left message-----need to know if pt is taking her losartan/hctz as directed.

## 2018-10-29 ENCOUNTER — Other Ambulatory Visit: Payer: Self-pay

## 2018-10-29 MED ORDER — LOSARTAN POTASSIUM-HCTZ 50-12.5 MG PO TABS: 1.0000 | ORAL_TABLET | Freq: Every day | ORAL | 1 refills | Status: DC

## 2018-11-03 ENCOUNTER — Ambulatory Visit (HOSPITAL_COMMUNITY)
Admission: EM | Admit: 2018-11-03 | Discharge: 2018-11-03 | Disposition: A | Discharge status: Home / Self Care | Payer: Medicare Other | Attending: Family Medicine | Admitting: Family Medicine

## 2018-11-03 ENCOUNTER — Encounter (HOSPITAL_COMMUNITY): Payer: Self-pay | Admitting: Emergency Medicine

## 2018-11-03 DIAGNOSIS — S90861A Insect bite (nonvenomous), right foot, initial encounter: Secondary | ICD-10-CM | POA: Diagnosis not present

## 2018-11-03 DIAGNOSIS — W57XXXA Bitten or stung by nonvenomous insect and other nonvenomous arthropods, initial encounter: Secondary | ICD-10-CM

## 2018-11-03 DIAGNOSIS — L0390 Cellulitis, unspecified: Secondary | ICD-10-CM

## 2018-11-03 MED ORDER — TRIAMCINOLONE ACETONIDE 0.1 % EX CREA: 1.0000 "application " | TOPICAL_CREAM | Freq: Two times a day (BID) | CUTANEOUS | 0 refills | Status: DC

## 2018-11-03 MED ORDER — OLMESARTAN MEDOXOMIL 40 MG PO TABS: 20.0000 mg | ORAL_TABLET | Freq: Every day | ORAL | 0 refills | Status: DC

## 2018-11-03 MED ORDER — SULFAMETHOXAZOLE-TRIMETHOPRIM 800-160 MG PO TABS: 1.0000 | ORAL_TABLET | Freq: Two times a day (BID) | ORAL | 0 refills | Status: AC

## 2018-11-03 NOTE — ED Provider Notes (Signed)
[facility]    CSN: [id number] Arrival date & time: 11/03/18  1517     History   Chief Complaint Chief Complaint  Patient presents with  . Foot Pain    HPI Amanda Ross is a 73 y.o. female.   HPI  She states that she had a "bump" on her toe.  It grew quite large.  She had swelling across top of her foot.  It itches and hurts.  It is getting more red.  She does not know what caused it.  She does not go barefoot.  She has not been outdoors.  Fever or chills. Other medical problems are well controlled.  She is compliant with medical care and prescriptions  Past Medical History:  Diagnosis Date  . Asthma   . GERD (gastroesophageal reflux disease)   . H/O bladder infections   . H/O measles   . H/O varicella   . Headache(784.0)    Frequently  . Hypertension   . OSA (obstructive sleep apnea)   . Osteoarthritis   . Yeast infection     Patient Active Problem List   Diagnosis Date Noted  . Vitamin D deficiency, unspecified 03/05/2018  . Mixed hyperlipidemia 03/05/2018  . 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 Pain in unspecified joint 03/05/2018  . Abnormal glucose 03/05/2018  . Trochanteric bursitis of both hips 07/30/2017  . Sjogren's syndrome ([place]) 05/22/2016  . Sicca syndrome, unspecified ([place]) 05/22/2016  . High risk medication use 05/22/2016  . Primary hyperparathyroidism ([place]) 07/09/2013  . Left thyroid nodule 07/06/2013  . Dyspnea 10/11/2010    Past Surgical History:  Procedure Laterality Date  . BUNIONECTOMY    . CARPAL TUNNEL RELEASE    . TOTAL SHOULDER ARTHROPLASTY      OB History    Gravida  4   Para  4   Term  3   Preterm  1   AB      Living  4     SAB      TAB      Ectopic      Multiple      Live Births  4            Home Medications    Prior to Admission medications   Medication Sig Start Date End Date Taking? Authorizing Provider  aspirin 81 MG tablet Take 81 mg by mouth daily.     [provider]  baclofen (LIORESAL) 10 MG tablet TAKE 1/2 TABLET BY MOUTH TWICE DAILY AS NEEDED 09/22/18   [name], MD  Cholecalciferol (VITAMIN D) 1000 UNITS capsule Take 2,000 Units by mouth daily.     [provider]  diclofenac sodium (VOLTAREN) 1 % GEL Voltaren Gel 3 grams to 3 large joints upto TID 3 TUBES with 3 refills 11/23/16   Panwala, Naitik, PA-C  hydroxychloroquine (PLAQUENIL) 200 MG tablet TAKE 1 TABLET BY MOUTH EVERY MORNING 08/29/18   Bo Merino, MD  ipratropium (ATROVENT) 0.03 % nasal spray Place 2 sprays into both nostrils every 12 (twelve) hours. 10/30/17   Wieters, Hallie C, PA-C  losartan-hydrochlorothiazide (HYZAAR) 50-12.5 MG tablet Take 1 tablet by mouth daily. 10/29/18   [name], MD  magnesium gluconate (MAGONATE) 500 MG tablet Take 500 mg by mouth daily.     [provider]  metoprolol tartrate (LOPRESSOR) 25 MG tablet Take 12.5 mg by mouth 2 (two) times daily.     [provider]  mirabegron ER (MYRBETRIQ) 25 MG TB24  tablet Take 1 tablet (25 mg total) by mouth daily. 04/03/18   Rodriguez-Southworth, Sunday Spillers, PA-C  olmesartan (BENICAR) 40 MG tablet Take 0.5 tablets (20 mg total) by mouth daily. 11/03/18   Raylene Everts, MD  omeprazole (PRILOSEC) 20 MG capsule Take 20 mg by mouth as needed.     [provider]  oxybutynin (DITROPAN-XL) 10 MG 24 hr tablet TK 1 T PO QD 10/10/18   [provider]  sulfamethoxazole-trimethoprim (BACTRIM DS) 800-160 MG tablet Take 1 tablet by mouth 2 (two) times daily for 7 days. 11/03/18 11/10/18  Raylene Everts, MD  traZODone (DESYREL) 50 MG tablet Take 1 tablet (50 mg total) by mouth at bedtime. 07/03/18   Minette Brine, FNP  triamcinolone cream (KENALOG) 0.1 % Apply 1 application topically 2 (two) times daily. 11/03/18   Raylene Everts, MD    Family History Family History  Problem Relation Age of Onset  . Asthma Daughter   . Heart disease Mother        CHF  .  Deep vein thrombosis Mother   . Rheum arthritis Mother   . Heart disease Brother   . Rheum arthritis Sister     Social History Social History   Tobacco Use  . Smoking status: Former Smoker    Packs/day: 1.00    Years: 40.00    Pack years: 40.00    Types: Cigarettes    Last attempt to quit: 06/18/2005    Years since quitting: 13.3  . Smokeless tobacco: Never Used  Substance Use Topics  . Alcohol use: No  . Drug use: No     Allergies   Ivp dye [iodinated diagnostic agents]; Iohexol; Penicillins; Percocet [oxycodone-acetaminophen]; and Prednisone   Review of Systems Review of Systems  Constitutional: Negative for chills and fever.  HENT: Negative for ear pain and sore throat.   Eyes: Negative for pain and visual disturbance.  Respiratory: Negative for cough and shortness of breath.   Cardiovascular: Negative for chest pain and palpitations.  Gastrointestinal: Negative for abdominal pain and vomiting.  Genitourinary: Negative for dysuria and hematuria.  Musculoskeletal: Negative for arthralgias and back pain.  Skin: Positive for color change. Negative for rash.  Neurological: Negative for seizures and syncope.  All other systems reviewed and are negative.    Physical Exam Triage Vital Signs ED Triage Vitals [11/03/18 1555]  Enc Vitals Group     BP 130/72     Pulse Rate 72     Resp 16     Temp 98.2 F (36.8 C)     Temp src      SpO2 97 %   No data found.  Updated Vital Signs BP 130/72   Pulse 72   Temp 98.2 F (36.8 C)   Resp 16   SpO2 97%       Physical Exam Constitutional:      General: She is not in acute distress.    Appearance: She is well-developed.  HENT:     Head: Normocephalic and atraumatic.  Eyes:     Conjunctiva/sclera: Conjunctivae normal.     Pupils: Pupils are equal, round, and reactive to light.  Neck:     Musculoskeletal: Normal range of motion.  Cardiovascular:     Rate and Rhythm: Normal rate.  Pulmonary:     Effort:  Pulmonary effort is normal. No respiratory distress.  Abdominal:     General: There is no distension.     Palpations: Abdomen is soft.  Musculoskeletal: Normal  range of motion.       Feet:  Skin:    General: Skin is warm and dry.  Neurological:     Mental Status: She is alert.      UC Treatments / Results  Labs (all labs ordered are listed, but only abnormal results are displayed) Labs Reviewed - No data to display  EKG None  Radiology No results found.  Procedures Procedures (including critical care time)  Medications Ordered in UC Medications - No data to display  Initial Impression / Assessment and Plan / UC Course  I have reviewed the triage vital signs and the nursing notes.  Pertinent labs & imaging results that were available during my care of the patient were reviewed by me and considered in my medical decision making (see chart for details).     Likely insect bite with cellulitis Final Clinical Impressions(s) / UC Diagnoses   Final diagnoses:  Insect bite of right foot, initial encounter  Cellulitis, unspecified cellulitis site     Discharge Instructions     Take the antibiotic 2 x a day for 3-5 days Use the cortisone cream 2 x a day until clears up Elevate foot to reduce swelling Call for problems here or PCP   ED Prescriptions    Medication Sig Dispense Auth. Provider   triamcinolone cream (KENALOG) 0.1 % Apply 1 application topically 2 (two) times daily. 30 g Raylene Everts, MD   sulfamethoxazole-trimethoprim (BACTRIM DS) 800-160 MG tablet Take 1 tablet by mouth 2 (two) times daily for 7 days. 14 tablet Raylene Everts, MD     Controlled Substance Prescriptions [place] Controlled Substance Registry consulted? Not Applicable   Raylene Everts, MD 11/03/18 2112

## 2018-11-03 NOTE — Discharge Instructions (Addendum)
Take the antibiotic 2 x a day for 3-5 days Use the cortisone cream 2 x a day until clears up Elevate foot to reduce swelling Call for problems here or PCP

## 2018-11-03 NOTE — ED Triage Notes (Signed)
Pt states about 3 days ago she noticied a red spot on her big toe of L foot, states it popped and drained some liquid, states its made her L foot swollen.

## 2018-11-08 ENCOUNTER — Other Ambulatory Visit: Payer: Self-pay | Admitting: Internal Medicine

## 2018-11-08 DIAGNOSIS — R252 Cramp and spasm: Secondary | ICD-10-CM

## 2018-11-16 ENCOUNTER — Other Ambulatory Visit: Payer: Self-pay | Admitting: Rheumatology

## 2018-11-17 NOTE — Telephone Encounter (Signed)
Last Visit: 03/31/18 Next Visit: due March 2020 Labs: 01/28/18 CBC WNL. Creatinine and GFR stable. Calcium is elevated.  PLQ eye exam: 08/06/2017 Normal.   Attempted to contact the patient and left message for patient to advise she is due to update PLQ eye exam as well as to schedule a follow up appointment.   Okay to refill 30 day supply PLQ?

## 2018-11-17 NOTE — Telephone Encounter (Signed)
Patient will need to update lab work prior to refill.

## 2018-11-30 ENCOUNTER — Other Ambulatory Visit: Payer: Self-pay | Admitting: Rheumatology

## 2018-11-30 ENCOUNTER — Other Ambulatory Visit: Payer: Self-pay | Admitting: Nurse Practitioner

## 2018-12-01 ENCOUNTER — Telehealth: Payer: Self-pay | Admitting: Rheumatology

## 2018-12-01 ENCOUNTER — Other Ambulatory Visit: Payer: Self-pay | Admitting: Nurse Practitioner

## 2018-12-01 NOTE — Telephone Encounter (Signed)
-----   Message from Weinert sent at 12/01/2018  9:41 AM EDT ----- Please call patient to schedule follow up. Patient is over due, she was due in March 2020. Thanks!

## 2018-12-01 NOTE — Progress Notes (Signed)
Office Visit Note  Patient: Amanda Ross             Date of Birth: Dec 12, 1945           MRN: [id number]             PCP: [name], MD Referring: [name], MD Visit Date: 12/15/2018 Occupation: @GUAROCC @  Subjective:  IT band syndrome bilaterally    History of Present Illness: Amanda Ross is a 73 y.o. female with history of Sjogren's and DDD.  She is taking Plaquenil 200 mg 1 tablet by mouth daily.  She has not missed any doses of Plaquenil recently.  She continues to have chronic sicca symptoms.  She goes to the dentist every 6 months and denies any recent dental caries.  She states that she does need to schedule an updated Plaquenil eye exam.  She continues to have bilateral trochanter bursitis worse on the right side.  She is pain that wakes her up at night due to lying on her right side.  She states the pain radiates down the lateral aspect of both legs.  She states that she has been noticing that she has been losing balance more frequently.  She denies any dizziness or lightheadedness.  She does feel as though her muscles have been weak.  She does not want to go to physical therapy at this time.  She has intermittent pain and swelling in both hands but denies any discomfort at this time.     Activities of Daily Living:  Patient reports morning stiffness for 5  minutes.   Patient Reports nocturnal pain.  Difficulty dressing/grooming: Denies Difficulty climbing stairs: Reports Difficulty getting out of chair: Reports Difficulty using hands for taps, buttons, cutlery, and/or writing: Denies  Review of Systems  Constitutional: Positive for fatigue.  HENT: Positive for mouth dryness. Negative for mouth sores and nose dryness.   Eyes: Positive for dryness. Negative for pain and visual disturbance.  Respiratory: Negative for cough, hemoptysis, shortness of breath and difficulty breathing.   Cardiovascular: Negative for chest pain, palpitations, hypertension and  swelling in legs/feet.  Gastrointestinal: Positive for constipation. Negative for blood in stool and diarrhea.  Endocrine: Negative for increased urination.  Genitourinary: Negative for painful urination.  Musculoskeletal: Positive for arthralgias, joint pain, morning stiffness and muscle tenderness. Negative for joint swelling, myalgias, muscle weakness and myalgias.  Skin: Negative for color change, pallor, rash, hair loss, nodules/bumps, skin tightness, ulcers and sensitivity to sunlight.  Allergic/Immunologic: Negative for susceptible to infections.  Neurological: Negative for dizziness, numbness, headaches and weakness.  Hematological: Negative for swollen glands.  Psychiatric/Behavioral: Positive for depressed mood. Negative for sleep disturbance. The patient is nervous/anxious.     PMFS History:  Patient Active Problem List   Diagnosis Date Noted  . Vitamin D deficiency, unspecified 03/05/2018  . Mixed hyperlipidemia 03/05/2018  . 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 Pain in unspecified joint 03/05/2018  . Abnormal glucose 03/05/2018  . Trochanteric bursitis of both hips 07/30/2017  . Sjogren's syndrome ([place]) 05/22/2016  . Sicca syndrome, unspecified ([place]) 05/22/2016  . High risk medication use 05/22/2016  . Primary hyperparathyroidism ([place]) 07/09/2013  . Left thyroid nodule 07/06/2013  . Dyspnea 10/11/2010    Past Medical History:  Diagnosis Date  . Asthma   . GERD (gastroesophageal reflux disease)   . H/O bladder infections   . H/O measles   . H/O varicella   . Headache(784.0)    Frequently  . Hypertension   .  OSA (obstructive sleep apnea)   . Osteoarthritis   . Yeast infection     Family History  Problem Relation Age of Onset  . Asthma Daughter   . Heart disease Mother        CHF  . Deep vein thrombosis Mother   . Rheum arthritis Mother   . Heart disease Brother   . Rheum arthritis Sister    Past Surgical History:  Procedure Laterality  Date  . BUNIONECTOMY    . CARPAL TUNNEL RELEASE    . TOTAL SHOULDER ARTHROPLASTY     Social History   Social History Narrative  . Not on file   Immunization History  Administered Date(s) Administered  . Influenza Split 03/25/2013  . Influenza, High Dose Seasonal PF 03/19/2018  . Pneumococcal-Unspecified 04/11/2012     Objective: Vital Signs: BP 127/69 (BP Location: Left Wrist, Patient Position: Sitting, Cuff Size: Normal)   Pulse 76   Resp 14   Ht 5\' 3"  (1.6 m)   Wt 228 lb 6.4 oz (103.6 kg)   BMI 40.46 kg/m    Physical Exam Vitals signs and nursing note reviewed.  Constitutional:      Appearance: She is well-developed.  HENT:     Head: Normocephalic and atraumatic.  Eyes:     Conjunctiva/sclera: Conjunctivae normal.  Neck:     Musculoskeletal: Normal range of motion.  Cardiovascular:     Rate and Rhythm: Normal rate and regular rhythm.     Heart sounds: Normal heart sounds.  Pulmonary:     Effort: Pulmonary effort is normal.     Breath sounds: Normal breath sounds.  Abdominal:     General: Bowel sounds are normal.     Palpations: Abdomen is soft.  Lymphadenopathy:     Cervical: No cervical adenopathy.  Skin:    General: Skin is warm and dry.     Capillary Refill: Capillary refill takes less than 2 seconds.  Neurological:     Mental Status: She is alert and oriented to person, place, and time.  Psychiatric:        Behavior: Behavior normal.      Musculoskeletal Exam: C-spine limited range of motion with lateral rotation.  Thoracic kyphosis noted.  Limited range of motion of lumbar spine with some discomfort.  No midline spinal tenderness.  No SI joint tenderness.  Shoulder joints have good range of motion with some discomfort.  Elbow joints, wrist joints, MCPs, PIPs, DIPs good range of motion no synovitis.  She has complete fist formation bilaterally.  Hip joints, knee joints, ankle joints, MCPs, PIPs and DIPs good range of motion no synovitis.  No warmth or  effusion of bilateral knee joints.  No tenderness or swelling of ankle joints.  She does have pedal edema bilaterally.  She has tenderness over bilateral trochanteric bursa and bilateral IT bands.  CDAI Exam: CDAI Score: - Patient Global: -; Provider Global: - Swollen: -; Tender: - Joint Exam   No joint exam has been documented for this visit   There is currently no information documented on the homunculus. Go to the Rheumatology activity and complete the homunculus joint exam.  Investigation: No additional findings.  Imaging: No results found.  Recent Labs: Lab Results  Component Value Date   WBC 8.0 02/19/2018   HGB 12.6 02/19/2018   PLT 209 01/28/2018   NA 140 04/02/2018   K 4.0 04/02/2018   CL 106 04/02/2018   CO2 22 04/02/2018   GLUCOSE 102 (H) 04/02/2018  BUN 15 04/02/2018   CREATININE 1.06 (H) 04/02/2018   BILITOT 0.3 04/02/2018   ALKPHOS 83 04/02/2018   AST 10 04/02/2018   ALT 13 04/02/2018   PROT 6.4 04/02/2018   ALBUMIN 4.1 04/02/2018   CALCIUM 10.6 (H) 04/02/2018   GFRAA 61 04/02/2018    Speciality Comments: PLQ eye exam: 08/06/2017 Normal. [facility]. Follow up in 6 months.  Procedures:  No procedures performed Allergies: Ivp dye [iodinated diagnostic agents], Iohexol, Penicillins, Percocet [oxycodone-acetaminophen], and Prednisone   Assessment / Plan:     Visit Diagnoses: Sjogren's syndrome with other organ involvement ([place]) -She continues to have chronic sicca symptoms.  She takes Plaquenil 200 mg 1 tablet by mouth daily.  She was encouraged to schedule dental exams every 6 months.  She has not had any recent dental caries.  She has no signs of inflammatory arthritis at this time.  She will continue taking Plaquenil 200 mg 1 tab by mouth daily.  A refill was sent to the pharmacy today.  CBC, CMP, and UA will be checked today.  She advised to notify us if she develops any new or worsening symptoms.  She will follow-up in the office in 5 months.   Plan: Urinalysis, Routine w reflex microscopic, hydroxychloroquine (PLAQUENIL) 200 MG tablet  High risk medication use - Plaquenil 200 mg 1 tablet by mouth daily.  Refill was sent to the pharmacy. PLQ eye exam: 08/06/2017 Normal. [facility].  She was advised to schedule an updated Plaquenil eye exam.  She will have these results faxed to our office.  CBC and CMP will be drawn today.  Standing orders were placed.  Plan: CBC with Differential/Platelet, COMPLETE METABOLIC PANEL WITH GFR, CBC with Differential/Platelet, COMPLETE METABOLIC PANEL WITH GFR  Trochanteric bursitis of both hips -She has tenderness over bilateral trochanteric bursa and IT bands.  She was given a handout of stretching exercises to perform.  She declined physical therapy at this time.  She has been having increased difficulty walking and laying on her right side at night.  She requested a handicap placard, so a temporary placard was provided.   Primary hyperparathyroidism ([place])   DDD (degenerative disc disease), cervical - She has limited ROM with discomfort.   Spinal stenosis of cervical region -She has limited ROM with discomfort.  She has no symptoms of radiculopathy at this time.   Cervical lymphadenopathy - Resolved.   Orders: Orders Placed This Encounter  Procedures  . CBC with Differential/Platelet  . COMPLETE METABOLIC PANEL WITH GFR  . CBC with Differential/Platelet  . COMPLETE METABOLIC PANEL WITH GFR  . Urinalysis, Routine w reflex microscopic   Meds ordered this encounter  Medications  . hydroxychloroquine (PLAQUENIL) 200 MG tablet    Sig: Take 1 tablet (200 mg total) by mouth every morning.    Dispense:  90 tablet    Refill:  0    Face-to-face time spent with patient was 30 minutes. Greater than 50% of time was spent in counseling and coordination of care.  Follow-Up Instructions: Return in about 5 months (around 05/17/2019) for Sjogren's syndrome, Osteoarthritis.   Ofilia Neas, PA-C   Note - This record has been created using Dragon software.  Chart creation errors have been sought, but may not always  have been located. Such creation errors do not reflect on  the standard of medical care.

## 2018-12-01 NOTE — Telephone Encounter (Signed)
Opened in error

## 2018-12-08 ENCOUNTER — Other Ambulatory Visit: Payer: Self-pay | Admitting: Internal Medicine

## 2018-12-08 DIAGNOSIS — R252 Cramp and spasm: Secondary | ICD-10-CM

## 2018-12-15 ENCOUNTER — Encounter: Payer: Self-pay | Admitting: Physician Assistant

## 2018-12-15 ENCOUNTER — Other Ambulatory Visit: Payer: Self-pay

## 2018-12-15 ENCOUNTER — Ambulatory Visit (INDEPENDENT_AMBULATORY_CARE_PROVIDER_SITE_OTHER): Payer: Medicare Other | Admitting: Physician Assistant

## 2018-12-15 VITALS — BP 127/69 | HR 76 | Resp 14 | Ht 63.0 in | Wt 228.4 lb

## 2018-12-15 DIAGNOSIS — M7062 Trochanteric bursitis, left hip: Secondary | ICD-10-CM

## 2018-12-15 DIAGNOSIS — M4802 Spinal stenosis, cervical region: Secondary | ICD-10-CM | POA: Diagnosis not present

## 2018-12-15 DIAGNOSIS — E210 Primary hyperparathyroidism: Secondary | ICD-10-CM

## 2018-12-15 DIAGNOSIS — Z79899 Other long term (current) drug therapy: Secondary | ICD-10-CM

## 2018-12-15 DIAGNOSIS — R590 Localized enlarged lymph nodes: Secondary | ICD-10-CM | POA: Diagnosis not present

## 2018-12-15 DIAGNOSIS — M7061 Trochanteric bursitis, right hip: Secondary | ICD-10-CM | POA: Diagnosis not present

## 2018-12-15 DIAGNOSIS — M3509 Sicca syndrome with other organ involvement: Secondary | ICD-10-CM | POA: Diagnosis not present

## 2018-12-15 DIAGNOSIS — M5030 Other cervical disc degeneration, unspecified cervical region: Secondary | ICD-10-CM | POA: Diagnosis not present

## 2018-12-15 MED ORDER — HYDROXYCHLOROQUINE SULFATE 200 MG PO TABS: 200.0000 mg | ORAL_TABLET | Freq: Every morning | ORAL | 0 refills | Status: DC

## 2018-12-15 NOTE — Patient Instructions (Addendum)
Hip Exercises Ask your health care provider which exercises are safe for you. Do exercises exactly as told by your health care provider and adjust them as directed. It is normal to feel mild stretching, pulling, tightness, or discomfort as you do these exercises. Stop right away if you feel sudden pain or your pain gets worse. Do not begin these exercises until told by your health care provider. Stretching and range-of-motion exercises These exercises warm up your muscles and joints and improve the movement and flexibility of your hip. These exercises also help to relieve pain, numbness, and tingling. You may be asked to limit your range of motion if you had a hip replacement. Talk to your health care provider about these restrictions. Hamstrings, supine  1. Lie on your back (supine position). 2. Loop a belt or towel over the ball of your left / right foot. The ball of your foot is on the walking surface, right under your toes. 3. Straighten your left / right knee and slowly pull on the belt or towel to raise your leg until you feel a gentle stretch behind your knee (hamstring). ? Do not let your knee bend while you do this. ? Keep your other leg flat on the floor. 4. Hold this position for __________ seconds. 5. Slowly return your leg to the starting position. Repeat __________ times. Complete this exercise __________ times a day. Hip rotation  1. Lie on your back on a firm surface. 2. With your left / right hand, gently pull your left / right knee toward the shoulder that is on the same side of the body. Stop when your knee is pointing toward the ceiling. 3. Hold your left / right ankle with your other hand. 4. Keeping your knee steady, gently pull your left / right ankle toward your other shoulder until you feel a stretch in your buttocks. ? Keep your hips and shoulders firmly planted while you do this stretch. 5. Hold this position for __________ seconds. Repeat __________ times. Complete  this exercise __________ times a day. Seated stretch This exercise is sometimes called hamstrings and adductors stretch. 1. Sit on the floor with your legs stretched wide. Keep your knees straight during this exercise. 2. Keeping your head and back in a straight line, bend at your waist to reach for your left foot (position A). You should feel a stretch in your right inner thigh (adductors). 3. Hold this position for __________ seconds. Then slowly return to the upright position. 4. Keeping your head and back in a straight line, bend at your waist to reach forward (position B). You should feel a stretch behind both of your thighs and knees (hamstrings). 5. Hold this position for __________ seconds. Then slowly return to the upright position. 6. Keeping your head and back in a straight line, bend at your waist to reach for your right foot (position C). You should feel a stretch in your left inner thigh (adductors). 7. Hold this position for __________ seconds. Then slowly return to the upright position. Repeat __________ times. Complete this exercise __________ times a day. Lunge This exercise stretches the muscles of the hip (hip flexors). 1. Place your left / right knee on the floor and bend your other knee so that is directly over your ankle. You should be half-kneeling. 2. Keep good posture with your head over your shoulders. 3. Tighten your buttocks to point your tailbone downward. This will prevent your back from arching too much. 4. You should feel a  gentle stretch in the front of your left / right thigh and hip. If you do not feel a stretch, slide your other foot forward slightly and then slowly lunge forward with your chest up until your knee once again lines up over your ankle. ? Make sure your tailbone continues to point downward. 5. Hold this position for __________ seconds. 6. Slowly return to the starting position. Repeat __________ times. Complete this exercise __________ times a  day. Strengthening exercises These exercises build strength and endurance in your hip. Endurance is the ability to use your muscles for a long time, even after they get tired. Bridge This exercise strengthens the muscles of your hip (hip extensors). 1. Lie on your back on a firm surface with your knees bent and your feet flat on the floor. 2. Tighten your buttocks muscles and lift your bottom off the floor until the trunk of your body and your hips are level with your thighs. ? Do not arch your back. ? You should feel the muscles working in your buttocks and the back of your thighs. If you do not feel these muscles, slide your feet 1-2 inches (2.5-5 cm) farther away from your buttocks. 3. Hold this position for __________ seconds. 4. Slowly lower your hips to the starting position. 5. Let your muscles relax completely between repetitions. Repeat __________ times. Complete this exercise __________ times a day. Straight leg raises, side-lying This exercise strengthens the muscles that move the hip joint away from the center of the body (hip abductors). 1. Lie on your side with your left / right leg in the top position. Lie so your head, shoulder, hip, and knee line up. You may bend your bottom knee slightly to help you balance. 2. Roll your hips slightly forward, so your hips are stacked directly over each other and your left / right knee is facing forward. 3. Leading with your heel, lift your top leg 4-6 inches (10-15 cm). You should feel the muscles in your top hip lifting. ? Do not let your foot drift forward. ? Do not let your knee roll toward the ceiling. 4. Hold this position for __________ seconds. 5. Slowly return to the starting position. 6. Let your muscles relax completely between repetitions. Repeat __________ times. Complete this exercise __________ times a day. Straight leg raises, side-lying This exercise strengthen the muscles that move the hip joint toward the center of the  body (hip adductors). 1. Lie on your side with your left / right leg in the bottom position. Lie so your head, shoulder, hip, and knee line up. You may place your upper foot in front to help you balance. 2. Roll your hips slightly forward, so your hips are stacked directly over each other and your left / right knee is facing forward. 3. Tense the muscles in your inner thigh and lift your bottom leg 4-6 inches (10-15 cm). 4. Hold this position for __________ seconds. 5. Slowly return to the starting position. 6. Let your muscles relax completely between repetitions. Repeat __________ times. Complete this exercise __________ times a day. Straight leg raises, supine This exercise strengthens the muscles in the front of your thigh (quadriceps). 1. Lie on your back (supine position) with your left / right leg extended and your other knee bent. 2. Tense the muscles in the front of your left / right thigh. You should see your kneecap slide up or see increased dimpling just above your knee. 3. Keep these muscles tight as you raise your   leg 4-6 inches (10-15 cm) off the floor. Do not let your knee bend. 4. Hold this position for __________ seconds. 5. Keep these muscles tense as you lower your leg. 6. Relax the muscles slowly and completely between repetitions. Repeat __________ times. Complete this exercise __________ times a day. Hip abductors, standing This exercise strengthens the muscles that move the leg and hip joint away from the center of the body (hip abductors). 1. Tie one end of a rubber exercise band or tubing to a secure surface, such as a chair, table, or pole. 2. Loop the other end of the band or tubing around your left / right ankle. 3. Keeping your ankle with the band or tubing directly opposite the secured end, step away until there is tension in the tubing or band. Hold on to a chair, table, or pole as needed for balance. 4. Lift your left / right leg out to your side. While you do  this: ? Keep your back upright. ? Keep your shoulders over your hips. ? Keep your toes pointing forward. ? Make sure to use your hip muscles to slowly lift your leg. Do not tip your body or forcefully lift your leg. 5. Hold this position for __________ seconds. 6. Slowly return to the starting position. Repeat __________ times. Complete this exercise __________ times a day. Squats This exercise strengthens the muscles in the front of your thigh (quadriceps). 1. Stand in a door frame so your feet and knees are in line with the frame. You may place your hands on the frame for balance. 2. Slowly bend your knees and lower your hips like you are going to sit in a chair. ? Keep your lower legs in a straight-up-and-down position. ? Do not let your hips go lower than your knees. ? Do not bend your knees lower than told by your health care provider. ? If your hip pain increases, do not bend as low. 3. Hold this position for ___________ seconds. 4. Slowly push with your legs to return to standing. Do not use your hands to pull yourself to standing. Repeat __________ times. Complete this exercise __________ times a day. This information is not intended to replace advice given to you by your health care provider. Make sure you discuss any questions you have with your health care provider. Document Released: 06/22/2005 Document Revised: 04/15/2018 Document Reviewed: 04/15/2018 Elsevier Patient Education  2020 [place] Band Syndrome [employer]hab Ask your health care provider which exercises are safe for you. Do exercises exactly as told by your health care provider and adjust them as directed. It is normal to feel mild stretching, pulling, tightness, or discomfort as you do these exercises. Stop right away if you feel sudden pain or your pain gets significantly worse. Do not begin these exercises until told by your health care provider. Stretching and range-of-motion exercises These  exercises warm up your muscles and joints and improve the movement and flexibility of your hip and pelvis. Quadriceps stretch, prone  1. Lie on your abdomen on a firm surface, such as a bed or padded floor (prone position). 2. Bend your left / right knee and reach back to hold your ankle or pant leg. If you cannot reach your ankle or pant leg, loop a belt around your foot and grab the belt instead. 3. Gently pull your heel toward your buttocks. Your knee should not slide out to the side. You should feel a stretch in the front of your  thigh and knee (quadriceps). 4. Hold this position for __________ seconds. Repeat __________ times. Complete this exercise __________ times a day. Iliotibial band stretch An iliotibial band is a strong band of muscle tissue that runs from the outer side of your hip to the outer side of your thigh and knee. 1. Lie on your side with your left / right leg in the top position. 2. Bend both of your knees and grab your left / right ankle. Stretch out your bottom arm to help you balance. 3. Slowly bring your top knee back so your thigh goes behind your trunk. 4. Slowly lower your top leg toward the floor until you feel a gentle stretch on the outside of your left / right hip and thigh. If you do not feel a stretch and your knee will not fall farther, place the heel of your other foot on top of your knee and pull your knee down toward the floor with your foot. 5. Hold this position for __________ seconds. Repeat __________ times. Complete this exercise __________ times a day. Strengthening exercises These exercises build strength and endurance in your hip and pelvis. Endurance is the ability to use your muscles for a long time, even after they get tired. Straight leg raises, side-lying This exercise strengthens the muscles that rotate the leg at the hip and move it away from your body (hip abductors). 1. Lie on your side with your left / right leg in the top position. Lie so  your head, shoulder, hip, and knee line up. You may bend your bottom knee to help you balance. 2. Roll your hips slightly forward so your hips are stacked directly over each other and your left / right knee is facing forward. 3. Tense the muscles in your outer thigh and lift your top leg 4-6 inches (10-15 cm). 4. Hold this position for __________ seconds. 5. Slowly return to the starting position. Let your muscles relax completely before doing another repetition. Repeat __________ times. Complete this exercise __________ times a day. Leg raises, prone This exercise strengthens the muscles that move the hips (hip extensors). 1. Lie on your abdomen on your bed or a firm surface. You can put a pillow under your hips if that is more comfortable for your lower back. 2. Bend your left / right knee so your foot is straight up in the air. 3. Squeeze your buttocks muscles and lift your left / right thigh off the bed. Do not let your back arch. 4. Tense your thigh muscle as hard as you can without increasing any knee pain. 5. Hold this position for __________ seconds. 6. Slowly lower your leg to the starting position and allow it to relax completely. Repeat __________ times. Complete this exercise __________ times a day. Hip hike 1. Stand sideways on a bottom step. Stand on your left / right leg with your other foot unsupported next to the step. You can hold on to the railing or wall for balance if needed. 2. Keep your knees straight and your torso square. Then lift your left / right hip up toward the ceiling. 3. Slowly let your left / right hip lower toward the floor, past the starting position. Your foot should get closer to the floor. Do not lean or bend your knees. Repeat __________ times. Complete this exercise __________ times a day. This information is not intended to replace advice given to you by your health care provider. Make sure you discuss any questions you have with your  health care  provider. Document Released: 06/04/2005 Document Revised: 09/25/2018 Document Reviewed: 03/26/2018 Elsevier Patient Education  2020 [employer].

## 2018-12-16 LAB — COMPLETE METABOLIC PANEL WITH GFR
AG Ratio: 1.6 (calc) (ref 1.0–2.5)
ALT: 17 U/L (ref 6–29)
AST: 18 U/L (ref 10–35)
Albumin: 4.2 g/dL (ref 3.6–5.1)
Alkaline phosphatase (APISO): 95 U/L (ref 37–153)
BUN/Creatinine Ratio: 15 (calc) (ref 6–22)
BUN: 17 mg/dL (ref 7–25)
CO2: 26 mmol/L (ref 20–32)
Calcium: 11 mg/dL — ABNORMAL HIGH (ref 8.6–10.4)
Chloride: 108 mmol/L (ref 98–110)
Creat: 1.16 mg/dL — ABNORMAL HIGH (ref 0.60–0.93)
GFR, Est African American: 54 mL/min/{1.73_m2} — ABNORMAL LOW (ref >=60)
GFR, Est Non African American: 47 mL/min/{1.73_m2} — ABNORMAL LOW (ref >=60)
Globulin: 2.7 g/dL (calc) (ref 1.9–3.7)
Glucose, Bld: 105 mg/dL — ABNORMAL HIGH (ref 65–99)
Potassium: 4 mmol/L (ref 3.5–5.3)
Sodium: 144 mmol/L (ref 135–146)
Total Bilirubin: 0.3 mg/dL (ref 0.2–1.2)
Total Protein: 6.9 g/dL (ref 6.1–8.1)

## 2018-12-16 LAB — CBC WITH DIFFERENTIAL/PLATELET
Absolute Monocytes: 533 cells/uL (ref 200–950)
Basophils Absolute: 69 cells/uL (ref 0–200)
Basophils Relative: 0.8 %
Eosinophils Absolute: 318 cells/uL (ref 15–500)
Eosinophils Relative: 3.7 %
HCT: 39.2 % (ref 35.0–45.0)
Hemoglobin: 13.4 g/dL (ref 11.7–15.5)
Lymphs Abs: 2322 cells/uL (ref 850–3900)
MCH: 31.2 pg (ref 27.0–33.0)
MCHC: 34.2 g/dL (ref 32.0–36.0)
MCV: 91.2 fL (ref 80.0–100.0)
MPV: 12.1 fL (ref 7.5–12.5)
Monocytes Relative: 6.2 %
Neutro Abs: 5358 cells/uL (ref 1500–7800)
Neutrophils Relative %: 62.3 %
Platelets: 227 10*3/uL (ref 140–400)
RBC: 4.3 10*6/uL (ref 3.80–5.10)
RDW: 13.1 % (ref 11.0–15.0)
Total Lymphocyte: 27 %
WBC: 8.6 10*3/uL (ref 3.8–10.8)

## 2018-12-16 LAB — URINALYSIS, ROUTINE W REFLEX MICROSCOPIC
Bilirubin Urine: NEGATIVE
Glucose, UA: NEGATIVE
Hgb urine dipstick: NEGATIVE
Ketones, ur: NEGATIVE
Leukocytes,Ua: NEGATIVE
Nitrite: NEGATIVE
Protein, ur: NEGATIVE
Specific Gravity, Urine: 1.018 (ref 1.001–1.03)
pH: <=5 (ref 5.0–8.0)

## 2018-12-16 NOTE — Progress Notes (Signed)
CBC WNL.  UA WNL.  Creatinine is elevated but stable.  Calcium is elevated-please notify patient ASAP and forward to PCP for further evaluation.

## 2018-12-28 ENCOUNTER — Other Ambulatory Visit: Payer: Self-pay | Admitting: Nurse Practitioner

## 2018-12-28 DIAGNOSIS — G4700 Insomnia, unspecified: Secondary | ICD-10-CM

## 2018-12-29 NOTE — Telephone Encounter (Signed)
Please refill prescription

## 2019-01-05 DIAGNOSIS — H2513 Age-related nuclear cataract, bilateral: Secondary | ICD-10-CM | POA: Diagnosis not present

## 2019-01-05 DIAGNOSIS — H40013 Open angle with borderline findings, low risk, bilateral: Secondary | ICD-10-CM | POA: Diagnosis not present

## 2019-01-05 DIAGNOSIS — H35033 Hypertensive retinopathy, bilateral: Secondary | ICD-10-CM | POA: Diagnosis not present

## 2019-01-05 DIAGNOSIS — H40033 Anatomical narrow angle, bilateral: Secondary | ICD-10-CM | POA: Diagnosis not present

## 2019-01-05 LAB — HM DIABETES EYE EXAM

## 2019-01-07 ENCOUNTER — Other Ambulatory Visit: Payer: Self-pay

## 2019-01-07 ENCOUNTER — Other Ambulatory Visit: Payer: Self-pay | Admitting: Internal Medicine

## 2019-01-07 ENCOUNTER — Encounter: Payer: Self-pay | Admitting: Internal Medicine

## 2019-01-07 ENCOUNTER — Other Ambulatory Visit: Payer: Self-pay | Admitting: Nurse Practitioner

## 2019-01-07 ENCOUNTER — Encounter: Payer: Self-pay | Admitting: Nurse Practitioner

## 2019-01-07 ENCOUNTER — Ambulatory Visit (INDEPENDENT_AMBULATORY_CARE_PROVIDER_SITE_OTHER): Payer: Medicare Other | Admitting: Internal Medicine

## 2019-01-07 ENCOUNTER — Other Ambulatory Visit: Payer: Self-pay | Admitting: Physician Assistant

## 2019-01-07 DIAGNOSIS — E66813 Obesity, class 3: Secondary | ICD-10-CM

## 2019-01-07 DIAGNOSIS — M3509 Sicca syndrome with other organ involvement: Secondary | ICD-10-CM

## 2019-01-07 DIAGNOSIS — Z6841 Body Mass Index (BMI) 40.0 and over, adult: Secondary | ICD-10-CM

## 2019-01-07 DIAGNOSIS — R202 Paresthesia of skin: Secondary | ICD-10-CM | POA: Diagnosis not present

## 2019-01-07 DIAGNOSIS — M79609 Pain in unspecified limb: Secondary | ICD-10-CM

## 2019-01-07 DIAGNOSIS — N183 Chronic kidney disease, stage 3 unspecified: Secondary | ICD-10-CM

## 2019-01-07 DIAGNOSIS — M5416 Radiculopathy, lumbar region: Secondary | ICD-10-CM

## 2019-01-07 DIAGNOSIS — Z79899 Other long term (current) drug therapy: Secondary | ICD-10-CM | POA: Diagnosis not present

## 2019-01-07 DIAGNOSIS — R252 Cramp and spasm: Secondary | ICD-10-CM

## 2019-01-07 DIAGNOSIS — I129 Hypertensive chronic kidney disease with stage 1 through stage 4 chronic kidney disease, or unspecified chronic kidney disease: Secondary | ICD-10-CM | POA: Diagnosis not present

## 2019-01-07 MED ORDER — OMEPRAZOLE 20 MG PO CPDR: 20.0000 mg | DELAYED_RELEASE_CAPSULE | Freq: Every day | ORAL | 1 refills | Status: DC

## 2019-01-08 ENCOUNTER — Other Ambulatory Visit: Payer: Self-pay | Admitting: Nurse Practitioner

## 2019-01-08 DIAGNOSIS — G4700 Insomnia, unspecified: Secondary | ICD-10-CM

## 2019-01-09 ENCOUNTER — Encounter: Payer: Self-pay | Admitting: Internal Medicine

## 2019-01-11 LAB — PROTEIN ELECTROPHORESIS, SERUM
A/G Ratio: 1.2 (ref 0.7–1.7)
Albumin ELP: 3.7 g/dL (ref 2.9–4.4)
Alpha 1: 0.2 g/dL (ref 0.0–0.4)
Alpha 2: 1 g/dL (ref 0.4–1.0)
Beta: 1.1 g/dL (ref 0.7–1.3)
Gamma Globulin: 0.8 g/dL (ref 0.4–1.8)
Globulin, Total: 3 g/dL (ref 2.2–3.9)
Total Protein: 6.7 g/dL (ref 6.0–8.5)

## 2019-01-11 LAB — PTH, INTACT AND CALCIUM
Calcium: 10.8 mg/dL — ABNORMAL HIGH (ref 8.7–10.3)
PTH: 37 pg/mL (ref 15–65)

## 2019-01-12 NOTE — Progress Notes (Signed)
Subjective:     Patient ID: Amanda Ross , female    DOB: 04-01-1946 , 73 y.o.   MRN: [id number]   Chief Complaint  Patient presents with  . elevated calcium level    HPI  She is here today for further evaluation of elevated calcium level. She is also followed by rheumatology, Dr. Estanislado Pandy, who performed bloodwork which was significant for elevated calcium levels. She is not taking any calcium supplements.     Past Medical History:  Diagnosis Date  . Asthma   . GERD (gastroesophageal reflux disease)   . H/O bladder infections   . H/O measles   . H/O varicella   . Headache(784.0)    Frequently  . Hypertension   . OSA (obstructive sleep apnea)   . Osteoarthritis   . Yeast infection      Family History  Problem Relation Age of Onset  . Asthma Daughter   . Heart disease Mother        CHF  . Deep vein thrombosis Mother   . Rheum arthritis Mother   . Heart disease Brother   . Rheum arthritis Sister      Current Outpatient Medications:  .  aspirin 81 MG tablet, Take 81 mg by mouth daily., Disp: , Rfl:  .  baclofen (LIORESAL) 10 MG tablet, TAKE 1/2 TABLET BY MOUTH TWICE DAILY AS NEEDED, Disp: 30 tablet, Rfl: 0 .  Cholecalciferol (VITAMIN D) 1000 UNITS capsule, Take 2,000 Units by mouth daily. , Disp: , Rfl:  .  diclofenac sodium (VOLTAREN) 1 % GEL, Voltaren Gel 3 grams to 3 large joints upto TID 3 TUBES with 3 refills, Disp: 3 Tube, Rfl: 3 .  hydroxychloroquine (PLAQUENIL) 200 MG tablet, Take 1 tablet (200 mg total) by mouth every morning., Disp: 90 tablet, Rfl: 0 .  ipratropium (ATROVENT) 0.03 % nasal spray, Place 2 sprays into both nostrils every 12 (twelve) hours. (Patient taking differently: Place 2 sprays into both nostrils as needed. ), Disp: 30 mL, Rfl: 12 .  losartan-hydrochlorothiazide (HYZAAR) 50-12.5 MG tablet, Take 1 tablet by mouth daily., Disp: 90 tablet, Rfl: 1 .  magnesium gluconate (MAGONATE) 500 MG tablet, Take 500 mg by mouth daily. , Disp: , Rfl:   .  metoprolol succinate (TOPROL-XL) 25 MG 24 hr tablet, TAKE 1 TABLET(25 MG) BY MOUTH DAILY, Disp: 90 tablet, Rfl: 0 .  mirabegron ER (MYRBETRIQ) 25 MG TB24 tablet, Take 1 tablet (25 mg total) by mouth daily., Disp: 1 tablet, Rfl: 0 .  omeprazole (PRILOSEC) 20 MG capsule, Take 1 capsule (20 mg total) by mouth daily., Disp: 90 capsule, Rfl: 1 .  oxybutynin (DITROPAN-XL) 10 MG 24 hr tablet, TK 1 T PO QD, Disp: , Rfl:  .  traZODone (DESYREL) 50 MG tablet, TAKE 1 TABLET BY MOUTH EVERY NIGHT AT BEDTIME, Disp: 90 tablet, Rfl: 0   Allergies  Allergen Reactions  . Ivp Dye [Iodinated Diagnostic Agents] Hives  . Iohexol   . Penicillins Hives  . Percocet [Oxycodone-Acetaminophen]   . Prednisone Nausea Only     Review of Systems  Constitutional: Negative.   Respiratory: Negative.   Cardiovascular: Negative.   Gastrointestinal: Negative.   Musculoskeletal: Positive for back pain.       She has chronic back pain. This is bothering her today. Also with tingling of RLE  Neurological: Positive for numbness.  Psychiatric/Behavioral: Negative.      Today's Vitals   01/07/19 1438  BP: 124/76  Pulse: 70  Temp: 98 F (  36.7 C)  TempSrc: Oral  Weight: 228 lb 6.4 oz (103.6 kg)  Height: 5\' 3"  (1.6 m)  PainSc: 0-No pain   Body mass index is 40.46 kg/m.   Objective:  Physical Exam Vitals signs and nursing note reviewed.  Constitutional:      Appearance: Normal appearance.  HENT:     Head: Normocephalic and atraumatic.  Cardiovascular:     Rate and Rhythm: Normal rate and regular rhythm.     Heart sounds: Normal heart sounds.  Pulmonary:     Effort: Pulmonary effort is normal.     Breath sounds: Normal breath sounds.  Musculoskeletal:     Lumbar back: She exhibits decreased range of motion and tenderness.  Skin:    General: Skin is warm.  Neurological:     General: No focal deficit present.     Mental Status: She is alert.  Psychiatric:        Mood and Affect: Mood normal.         Behavior: Behavior normal.         Assessment And Plan:     1. Hypercalcemia  I will check labs as listed below. I will make further recommendations once her labs are available for review. She will rto in 3-4 months for re-evaluation. She is encouraged to stay well hydrated.   - Protein electrophoresis, serum - Parathyroid Hormone, Intact w/Ca  2. Hypertensive nephropathy  Chronic, well controlled. She will continue with current meds. She is encouraged to avoid adding salt to her foods. Most recent BMP was reviewed in full detail.   3. Chronic renal disease, stage III (HCC)  Chronic. She is encouraged to stay well hydrated.  4. Lumbar radiculopathy  Chronic. She is encouraged to perform stretching exercises regularly. She may benefit from physical therapy.   5. Paresthesia and pain of right extremity  Intermittent.  Likely related to lumbar spinal disease.   6. Class 3 severe obesity due to excess calories with serious comorbidity and body mass index (BMI) of 40.0 to 44.9 in adult [facility])  Importance of achieving optimal weight to decrease risk of cardiovascular disease and cancers was discussed with the patient in full detail. She is encouraged to start slowly - start with 10 minutes twice daily at least three to four days per week and to gradually build to 30 minutes five days weekly. She was given tips to incorporate more activity into her daily routine - take stairs when possible, park farther away from grocery stores, etc.    7. Drug therapy    Maximino Greenland, MD    THE PATIENT IS ENCOURAGED TO PRACTICE SOCIAL DISTANCING DUE TO THE COVID-19 PANDEMIC.

## 2019-01-12 NOTE — Patient Instructions (Signed)
Hypercalcemia Hypercalcemia is when the level of calcium in a person's blood is above normal. The body needs calcium to make bones and keep them strong. Calcium also helps the muscles, nerves, brain, and heart work the way they should. Most of the calcium in the body is in the bones. There is also some calcium in the blood. Hypercalcemia can happen when calcium comes out of the bones, or when the kidneys are not able to remove calcium from the blood. Hypercalcemia can be mild or severe. What are the causes? There are many possible causes of hypercalcemia. Common causes of this condition include:  Hyperparathyroidism. This is a condition in which the body produces too much parathyroid hormone. There are four parathyroid glands in your neck. These glands produce a chemical messenger (hormone) that helps the body absorb calcium from foods and helps your bones release calcium.  Certain kinds of cancer. Less common causes of hypercalcemia include:  Getting too much calcium or vitamin D from your diet.  Kidney failure.  Hyperthyroidism.  Severe dehydration.  Being on bed rest or being inactive for a long time.  Certain medicines.  Infections. What increases the risk? You are more likely to develop this condition if you:  Are female.  Are 47 years of age or older.  Have a family history of hypercalcemia. What are the signs or symptoms? Mild hypercalcemia that starts slowly may not cause symptoms. Severe, sudden hypercalcemia is more likely to cause symptoms, such as:  Being more thirsty than usual.  Needing to urinate more often than usual.  Abdominal pain.  Nausea and vomiting.  Constipation.  Muscle pain, twitching, or weakness.  Feeling very tired. How is this diagnosed?  Hypercalcemia is usually diagnosed with a blood test. You may also have tests to help determine what is causing this condition, such as imaging tests and more blood tests. How is this treated?  Treatment for hypercalcemia depends on the cause. Treatment may include:  Receiving fluids through an IV.  Medicines that: ? Keep calcium levels steady after receiving fluids (loop diuretics). ? Keep calcium in your bones (bisphosphonates). ? Lower the calcium level in your blood.  Surgery to remove overactive parathyroid glands.  A procedure that filters your blood to correct calcium levels (hemodialysis). Follow these instructions at home:   Take over-the-counter and prescription medicines only as told by your health care provider.  Follow instructions from your health care provider about eating or drinking restrictions.  Drink enough fluid to keep your urine pale yellow.  Stay active. Weight-bearing exercise helps to keep calcium in your bones. Follow instructions from your health care provider about what type and level of exercise is safe for you.  Keep all follow-up visits as told by your health care provider. This is important. Contact a health care provider if you have:  A fever.  A heartbeat that is irregular or very fast.  Changes in mood, memory, or personality. Get help right away if you:  Have severe abdominal pain.  Have chest pain.  Have trouble breathing.  Become very confused and sleepy.  Lose consciousness. Summary  Hypercalcemia is when the level of calcium in a person's blood is above normal. The body needs calcium to make bones and keep them strong. Calcium also helps the muscles, nerves, brain, and heart work the way they should.  There are many possible causes of hypercalcemia, and treatment depends on the cause.  Take over-the-counter and prescription medicines only as told by your health care  provider.  Follow instructions from your health care provider about eating or drinking restrictions. This information is not intended to replace advice given to you by your health care provider. Make sure you discuss any questions you have with your  health care provider. Document Released: 08/18/2004 Document Revised: 07/01/2018 Document Reviewed: 03/10/2018 Elsevier Patient Education  2020 [employer].

## 2019-01-27 ENCOUNTER — Encounter: Payer: Self-pay | Admitting: Internal Medicine

## 2019-02-06 ENCOUNTER — Other Ambulatory Visit: Payer: Self-pay | Admitting: Internal Medicine

## 2019-02-06 DIAGNOSIS — R252 Cramp and spasm: Secondary | ICD-10-CM

## 2019-02-26 ENCOUNTER — Ambulatory Visit (INDEPENDENT_AMBULATORY_CARE_PROVIDER_SITE_OTHER): Payer: Medicare Other

## 2019-02-26 ENCOUNTER — Other Ambulatory Visit: Payer: Self-pay

## 2019-02-26 ENCOUNTER — Ambulatory Visit: Payer: Medicare Other | Admitting: Nurse Practitioner

## 2019-02-26 VITALS — BP 132/82 | HR 74 | Temp 98.1°F | Ht 62.8 in | Wt 225.2 lb

## 2019-02-26 DIAGNOSIS — Z1211 Encounter for screening for malignant neoplasm of colon: Secondary | ICD-10-CM

## 2019-02-26 DIAGNOSIS — Z0000 Encounter for general adult medical examination without abnormal findings: Secondary | ICD-10-CM | POA: Diagnosis not present

## 2019-02-26 DIAGNOSIS — Z1239 Encounter for other screening for malignant neoplasm of breast: Secondary | ICD-10-CM

## 2019-02-26 DIAGNOSIS — Z23 Encounter for immunization: Secondary | ICD-10-CM | POA: Diagnosis not present

## 2019-02-26 MED ORDER — BOOSTRIX 5-2.5-18.5 LF-MCG/0.5 IM SUSP: 0.5000 mL | Freq: Once | INTRAMUSCULAR | 0 refills | Status: AC

## 2019-02-26 MED ORDER — PREVNAR 13 IM SUSP: 0.5000 mL | INTRAMUSCULAR | 0 refills | Status: AC

## 2019-02-26 NOTE — Progress Notes (Signed)
Subjective:   Amanda Ross is a 73 y.o. female who presents for Medicare Annual (Subsequent) preventive examination.  Review of Systems:  n/a Cardiac Risk Factors include: advanced age (>70men, >76 women);dyslipidemia;hypertension;sedentary lifestyle;obesity (BMI >30kg/m2)     Objective:     Vitals: BP 132/82 (BP Location: Left Arm, Patient Position: Sitting, Cuff Size: Normal)   Pulse 74   Temp 98.1 F (36.7 C) (Oral)   Ht 5' 2.8" (1.595 m)   Wt 225 lb 3.2 oz (102.2 kg)   SpO2 96%   BMI 40.15 kg/m   Body mass index is 40.15 kg/m.  Advanced Directives 02/26/2019 01/11/2015  Does Patient Have a Medical Advance Directive? No No  Would patient like information on creating a medical advance directive? - No - patient declined information    Tobacco Social History   Tobacco Use  Smoking Status Former Smoker  . Packs/day: 1.00  . Years: 40.00  . Pack years: 40.00  . Types: Cigarettes  . Quit date: 06/18/2005  . Years since quitting: 13.7  Smokeless Tobacco Never Used     Counseling given: Not Answered   Clinical Intake:  Pre-visit preparation completed: Yes  Pain : 0-10 Pain Score: 4  Pain Type: Chronic pain Pain Location: Generalized Pain Descriptors / Indicators: Aching Pain Onset: More than a month ago Pain Frequency: Constant Pain Relieving Factors: tylenol helps some  Pain Relieving Factors: tylenol helps some  Nutritional Status: BMI > 30  Obese Nutritional Risks: None Diabetes: No  How often do you need to have someone help you when you read instructions, pamphlets, or other written materials from your doctor or pharmacy?: 1 - Never What is the last grade level you completed in school?: 12th grade  Interpreter Needed?: No  Information entered by :: NAllen LPN  Past Medical History:  Diagnosis Date  . Asthma   . GERD (gastroesophageal reflux disease)   . H/O bladder infections   . H/O measles   . H/O varicella   . Headache(784.0)    Frequently  . Hypertension   . OSA (obstructive sleep apnea)   . Osteoarthritis   . Yeast infection    Past Surgical History:  Procedure Laterality Date  . BUNIONECTOMY    . CARPAL TUNNEL RELEASE    . TOTAL SHOULDER ARTHROPLASTY     Family History  Problem Relation Age of Onset  . Asthma Daughter   . Heart disease Mother        CHF  . Deep vein thrombosis Mother   . Rheum arthritis Mother   . Heart disease Brother   . Rheum arthritis Sister    Social History   Socioeconomic History  . Marital status: Married    Spouse name: Not on file  . Number of children: Not on file  . Years of education: Not on file  . Highest education level: Not on file  Occupational History  . 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 Financial resource strain: Not hard at all  . Food insecurity    Worry: Never true    Inability: Never true  . Transportation needs    Medical: No    Non-medical: No  Tobacco Use  . Smoking status: Former Smoker    Packs/day: 1.00    Years: 40.00    Pack years: 40.00    Types: Cigarettes    Quit date: 06/18/2005    Years since quitting: 13.7  . Smokeless tobacco:  Never Used  Substance and Sexual Activity  . Alcohol use: Not Currently    Comment: rare  . Drug use: No  . Sexual activity: Not Currently  Lifestyle  . Physical activity    Days per week: 0 days    Minutes per session: 0 min  . Stress: To some extent  Relationships  . Social Herbalist on phone: Not on file    Gets together: Not on file    Attends religious service: Not on file    Active member of club or organization: Not on file    Attends meetings of clubs or organizations: Not on file    Relationship status: Not on file  Other Topics Concern  . Not on file  Social History Narrative  . Not on file    Outpatient Encounter Medications as of 02/26/2019  Medication Sig  . aspirin 81 MG tablet Take 81 mg by mouth daily.  . baclofen (LIORESAL)  10 MG tablet TAKE 1/2 TABLET BY MOUTH TWICE DAILY AS NEEDED  . Cholecalciferol (VITAMIN D) 1000 UNITS capsule Take 2,000 Units by mouth daily.   . diclofenac sodium (VOLTAREN) 1 % GEL Voltaren Gel 3 grams to 3 large joints upto TID 3 TUBES with 3 refills  . hydroxychloroquine (PLAQUENIL) 200 MG tablet Take 1 tablet (200 mg total) by mouth every morning.  Marland Kitchen ipratropium (ATROVENT) 0.03 % nasal spray Place 2 sprays into both nostrils every 12 (twelve) hours. (Patient taking differently: Place 2 sprays into both nostrils as needed. )  . losartan-hydrochlorothiazide (HYZAAR) 50-12.5 MG tablet Take 1 tablet by mouth daily.  . magnesium gluconate (MAGONATE) 500 MG tablet Take 500 mg by mouth daily.   . metoprolol succinate (TOPROL-XL) 25 MG 24 hr tablet TAKE 1 TABLET(25 MG) BY MOUTH DAILY  . mirabegron ER (MYRBETRIQ) 25 MG TB24 tablet Take 1 tablet (25 mg total) by mouth daily.  Marland Kitchen omeprazole (PRILOSEC) 20 MG capsule Take 1 capsule (20 mg total) by mouth daily.  Marland Kitchen oxybutynin (DITROPAN-XL) 10 MG 24 hr tablet TK 1 T PO QD  . traZODone (DESYREL) 50 MG tablet TAKE 1 TABLET BY MOUTH EVERY NIGHT AT BEDTIME   No facility-administered encounter medications on file as of 02/26/2019.     Activities of Daily Living In your present state of health, do you have any difficulty performing the following activities: 02/26/2019 01/07/2019  Hearing? N N  Vision? N N  Difficulty concentrating or making decisions? Y Y  Comment forgetfulness -  Walking or climbing stairs? Tempie Donning  Comment feels unsteady at times -  Dressing or bathing? N N  Doing errands, shopping? N N  Preparing Food and eating ? N -  Using the Toilet? N -  In the past six months, have you accidently leaked urine? Y -  Comment wears a pad -  Do you have problems with loss of bowel control? N -  Managing your Medications? N -  Managing your Finances? N -  Housekeeping or managing your Housekeeping? N -  Some recent data might be hidden    Patient  Care Team: [name], MD as PCP - General (Internal Medicine) [name], MD (Internal Medicine)    Assessment:   This is a routine wellness examination for [name].  Exercise Activities and Dietary recommendations Current Exercise Habits: The patient does not participate in regular exercise at present  Goals    . Patient Stated     02/25/2019, wants to gain strength  Fall Risk Fall Risk  02/26/2019 03/19/2018 01/17/2018  Falls in the past year? 0 No No  Comment - - Emmi Telephone Survey: data to providers prior to load  Risk for fall due to : Medication side effect - -  Follow up Falls evaluation completed;Education provided;Falls prevention discussed - -   Is the patient's home free of loose throw rugs in walkways, pet beds, electrical cords, etc?   yes      Grab bars in the bathroom? no      Handrails on the stairs?   yes      Adequate lighting?   yes  Timed Get Up and Go performed: n/a  Depression Screen PHQ 2/9 Scores 02/26/2019 03/19/2018  PHQ - 2 Score 6 2  PHQ- 9 Score 12 9     Cognitive Function     6CIT Screen 02/26/2019  What Year? 0 points  What month? 0 points  What time? 0 points  Count back from 20 0 points  Months in reverse 0 points  Repeat phrase 0 points  Total Score 0    Immunization History  Administered Date(s) Administered  . Influenza Split 03/25/2013  . Influenza, High Dose Seasonal PF 03/19/2018  . Pneumococcal-Unspecified 04/11/2012    Qualifies for Shingles Vaccine? yes  Screening Tests Health Maintenance  Topic Date Due  . TETANUS/TDAP  08/31/1964  . PNA vac Low Risk Adult (2 of 2 - PCV13) 04/11/2013  . INFLUENZA VACCINE  01/17/2019  . MAMMOGRAM  03/07/2019  . COLONOSCOPY  04/25/2021  . DEXA SCAN  Completed  . Hepatitis C Screening  Completed    Cancer Screenings: Lung: Low Dose CT Chest recommended if Age 5-80 years, 30 pack-year currently smoking OR have quit w/in 15years. Patient does not qualify. Breast:   Up to date on Mammogram? No   Up to date of Bone Density/Dexa? Yes Colorectal:   Additional Screenings: : Hepatitis C Screening: 09/2012     Plan:    Patient wants to gain strength.   I have personally reviewed and noted the following in the patient's chart:   . Medical and social history . Use of alcohol, tobacco or illicit drugs  . Current medications and supplements . Functional ability and status . Nutritional status . Physical activity . Advanced directives . List of other physicians . Hospitalizations, surgeries, and ER visits in previous 12 months . Vitals . Screenings to include cognitive, depression, and falls . Referrals and appointments  In addition, I have reviewed and discussed with patient certain preventive protocols, quality metrics, and best practice recommendations. A written personalized care plan for preventive services as well as general preventive health recommendations were provided to patient.     Kellie Simmering, LPN  [phone]

## 2019-02-26 NOTE — Patient Instructions (Signed)
Amanda Ross , Thank you for taking time to come for your Medicare Wellness Visit. I appreciate your ongoing commitment to your health goals. Please review the following plan we discussed and let me know if I can assist you in the future.   Screening recommendations/referrals: Colonoscopy: 03/2012 Mammogram: referred Bone Density: 05/2013 Recommended yearly ophthalmology/optometry visit for glaucoma screening and checkup Recommended yearly dental visit for hygiene and checkup  Vaccinations: Influenza vaccine: today Pneumococcal vaccine: sent to pharmacy Tdap vaccine: sent to pharmacy Shingles vaccine: discussed    Advanced directives: Advance directive discussed with you today. Even though you declined this today please call our office should you change your mind and we can give you the proper paperwork for you to fill out.   Conditions/risks identified: obesity  Next appointment: 03/24/2019 at 2:00   Preventive Care 34 Years and Older, Female Preventive care refers to lifestyle choices and visits with your health care provider that can promote health and wellness. What does preventive care include?  A yearly physical exam. This is also called an annual well check.  Dental exams once or twice a year.  Routine eye exams. Ask your health care provider how often you should have your eyes checked.  Personal lifestyle choices, including:  Daily care of your teeth and gums.  Regular physical activity.  Eating a healthy diet.  Avoiding tobacco and drug use.  Limiting alcohol use.  Practicing safe sex.  Taking low-dose aspirin every day.  Taking vitamin and mineral supplements as recommended by your health care provider. What happens during an annual well check? The services and screenings done by your health care provider during your annual well check will depend on your age, overall health, lifestyle risk factors, and family history of disease. Counseling  Your health  care provider may ask you questions about your:  Alcohol use.  Tobacco use.  Drug use.  Emotional well-being.  Home and relationship well-being.  Sexual activity.  Eating habits.  History of falls.  Memory and ability to understand (cognition).  Work and work Statistician.  Reproductive health. Screening  You may have the following tests or measurements:  Height, weight, and BMI.  Blood pressure.  Lipid and cholesterol levels. These may be checked every 5 years, or more frequently if you are over 58 years old.  Skin check.  Lung cancer screening. You may have this screening every year starting at age 2 if you have a 30-pack-year history of smoking and currently smoke or have quit within the past 15 years.  Fecal occult blood test (FOBT) of the stool. You may have this test every year starting at age 88.  Flexible sigmoidoscopy or colonoscopy. You may have a sigmoidoscopy every 5 years or a colonoscopy every 10 years starting at age 15.  Hepatitis C blood test.  Hepatitis B blood test.  Sexually transmitted disease (STD) testing.  Diabetes screening. This is done by checking your blood sugar (glucose) after you have not eaten for a while (fasting). You may have this done every 1-3 years.  Bone density scan. This is done to screen for osteoporosis. You may have this done starting at age 98.  Mammogram. This may be done every 1-2 years. Talk to your health care provider about how often you should have regular mammograms. Talk with your health care provider about your test results, treatment options, and if necessary, the need for more tests. Vaccines  Your health care provider may recommend certain vaccines, such as:  Influenza vaccine.  This is recommended every year.  Tetanus, diphtheria, and acellular pertussis (Tdap, Td) vaccine. You may need a Td booster every 10 years.  Zoster vaccine. You may need this after age 33.  Pneumococcal 13-valent conjugate  (PCV13) vaccine. One dose is recommended after age 35.  Pneumococcal polysaccharide (PPSV23) vaccine. One dose is recommended after age 33. Talk to your health care provider about which screenings and vaccines you need and how often you need them. This information is not intended to replace advice given to you by your health care provider. Make sure you discuss any questions you have with your health care provider. Document Released: 07/01/2015 Document Revised: 02/22/2016 Document Reviewed: 04/05/2015 Elsevier Interactive Patient Education  2017 [employer] Prevention in the Home Falls can cause injuries. They can happen to people of all ages. There are many things you can do to make your home safe and to help prevent falls. What can I do on the outside of my home?  Regularly fix the edges of walkways and driveways and fix any cracks.  Remove anything that might make you trip as you walk through a door, such as a raised step or threshold.  Trim any bushes or trees on the path to your home.  Use bright outdoor lighting.  Clear any walking paths of anything that might make someone trip, such as rocks or tools.  Regularly check to see if handrails are loose or broken. Make sure that both sides of any steps have handrails.  Any raised decks and porches should have guardrails on the edges.  Have any leaves, snow, or ice cleared regularly.  Use sand or salt on walking paths during winter.  Clean up any spills in your garage right away. This includes oil or grease spills. What can I do in the bathroom?  Use night lights.  Install grab bars by the toilet and in the tub and shower. Do not use towel bars as grab bars.  Use non-skid mats or decals in the tub or shower.  If you need to sit down in the shower, use a plastic, non-slip stool.  Keep the floor dry. Clean up any water that spills on the floor as soon as it happens.  Remove soap buildup in the tub or shower  regularly.  Attach bath mats securely with double-sided non-slip rug tape.  Do not have throw rugs and other things on the floor that can make you trip. What can I do in the bedroom?  Use night lights.  Make sure that you have a light by your bed that is easy to reach.  Do not use any sheets or blankets that are too big for your bed. They should not hang down onto the floor.  Have a firm chair that has side arms. You can use this for support while you get dressed.  Do not have throw rugs and other things on the floor that can make you trip. What can I do in the kitchen?  Clean up any spills right away.  Avoid walking on wet floors.  Keep items that you use a lot in easy-to-reach places.  If you need to reach something above you, use a strong step stool that has a grab bar.  Keep electrical cords out of the way.  Do not use floor polish or wax that makes floors slippery. If you must use wax, use non-skid floor wax.  Do not have throw rugs and other things on the floor that can make  you trip. What can I do with my stairs?  Do not leave any items on the stairs.  Make sure that there are handrails on both sides of the stairs and use them. Fix handrails that are broken or loose. Make sure that handrails are as long as the stairways.  Check any carpeting to make sure that it is firmly attached to the stairs. Fix any carpet that is loose or worn.  Avoid having throw rugs at the top or bottom of the stairs. If you do have throw rugs, attach them to the floor with carpet tape.  Make sure that you have a light switch at the top of the stairs and the bottom of the stairs. If you do not have them, ask someone to add them for you. What else can I do to help prevent falls?  Wear shoes that:  Do not have high heels.  Have rubber bottoms.  Are comfortable and fit you well.  Are closed at the toe. Do not wear sandals.  If you use a stepladder:  Make sure that it is fully  opened. Do not climb a closed stepladder.  Make sure that both sides of the stepladder are locked into place.  Ask someone to hold it for you, if possible.  Clearly mark and make sure that you can see:  Any grab bars or handrails.  First and last steps.  Where the edge of each step is.  Use tools that help you move around (mobility aids) if they are needed. These include:  Canes.  Walkers.  Scooters.  Crutches.  Turn on the lights when you go into a dark area. Replace any light bulbs as soon as they burn out.  Set up your furniture so you have a clear path. Avoid moving your furniture around.  If any of your floors are uneven, fix them.  If there are any pets around you, be aware of where they are.  Review your medicines with your doctor. Some medicines can make you feel dizzy. This can increase your chance of falling. Ask your doctor what other things that you can do to help prevent falls. This information is not intended to replace advice given to you by your health care provider. Make sure you discuss any questions you have with your health care provider. Document Released: 03/31/2009 Document Revised: 11/10/2015 Document Reviewed: 07/09/2014 Elsevier Interactive Patient Education  2017 [employer].

## 2019-03-04 ENCOUNTER — Telehealth: Payer: Self-pay | Admitting: Internal Medicine

## 2019-03-04 NOTE — Chronic Care Management (AMB) (Signed)
Chronic Care Management   Note  03/04/2019 Name: Amanda Ross MRN: [id number] DOB: May 07, 1946  Amanda Ross is a 73 y.o. year old female who is a primary care patient of [name], MD. I reached out to Karlyn Agee by phone today in response to a referral sent by Ms. Drusilla Kanner Tinch's health plan.    [name] was given information about Chronic Care Management services today including:  1. CCM service includes personalized support from designated clinical staff supervised by her physician, including individualized plan of care and coordination with other care providers 2. 24/7 contact phone numbers for assistance for urgent and routine care needs. 3. Service will only be billed when office clinical staff spend 20 minutes or more in a month to coordinate care. 4. Only one practitioner may furnish and bill the service in a calendar month. 5. The patient may stop CCM services at any time (effective at the end of the month) by phone call to the office staff. 6. The patient will be responsible for cost sharing (co-pay) of up to 20% of the service fee (after annual deductible is met).  Patient agreed to services and verbal consent obtained.   Follow up plan: Telephone appointment with CCM team member scheduled for: 04/09/2019  [place]  [email or web address]   ??[phone]

## 2019-03-12 ENCOUNTER — Telehealth: Payer: Self-pay | Admitting: Rheumatology

## 2019-03-12 DIAGNOSIS — M3509 Sicca syndrome with other organ involvement: Secondary | ICD-10-CM

## 2019-03-12 MED ORDER — HYDROXYCHLOROQUINE SULFATE 200 MG PO TABS: 200.0000 mg | ORAL_TABLET | Freq: Every morning | ORAL | 0 refills | Status: DC

## 2019-03-12 NOTE — Telephone Encounter (Signed)
Last Visit: 12/15/18 Next Visit: 05/19/19 Labs: 12/15/18 CBC WNLCreatinine is elevated but stable. Calcium is elevated Eye exam: 01/05/19 WNL   Okay to refill per Dr. Estanislado Pandy

## 2019-03-12 NOTE — Telephone Encounter (Signed)
Patient called requesting prescription refill of Plaquenil to be sent to [facility].

## 2019-03-24 ENCOUNTER — Encounter: Payer: Self-pay | Admitting: Internal Medicine

## 2019-03-24 ENCOUNTER — Other Ambulatory Visit: Payer: Self-pay

## 2019-03-24 ENCOUNTER — Ambulatory Visit (INDEPENDENT_AMBULATORY_CARE_PROVIDER_SITE_OTHER): Payer: Medicare Other | Admitting: Internal Medicine

## 2019-03-24 VITALS — BP 134/78 | HR 76 | Temp 98.4°F | Ht 62.2 in | Wt 224.0 lb

## 2019-03-24 DIAGNOSIS — R269 Unspecified abnormalities of gait and mobility: Secondary | ICD-10-CM | POA: Diagnosis not present

## 2019-03-24 DIAGNOSIS — I129 Hypertensive chronic kidney disease with stage 1 through stage 4 chronic kidney disease, or unspecified chronic kidney disease: Secondary | ICD-10-CM

## 2019-03-24 DIAGNOSIS — Z1231 Encounter for screening mammogram for malignant neoplasm of breast: Secondary | ICD-10-CM

## 2019-03-24 DIAGNOSIS — R413 Other amnesia: Secondary | ICD-10-CM

## 2019-03-24 DIAGNOSIS — Z6841 Body Mass Index (BMI) 40.0 and over, adult: Secondary | ICD-10-CM | POA: Diagnosis not present

## 2019-03-24 DIAGNOSIS — N1831 Chronic kidney disease, stage 3a: Secondary | ICD-10-CM | POA: Diagnosis not present

## 2019-03-24 NOTE — Patient Instructions (Signed)

## 2019-03-25 LAB — BMP8+EGFR
BUN/Creatinine Ratio: 15 (ref 12–28)
BUN: 19 mg/dL (ref 8–27)
CO2: 24 mmol/L (ref 20–29)
Calcium: 11.2 mg/dL — ABNORMAL HIGH (ref 8.7–10.3)
Chloride: 104 mmol/L (ref 96–106)
Creatinine, Ser: 1.27 mg/dL — ABNORMAL HIGH (ref 0.57–1.00)
GFR calc Af Amer: 48 mL/min/{1.73_m2} — ABNORMAL LOW (ref >59)
GFR calc non Af Amer: 42 mL/min/{1.73_m2} — ABNORMAL LOW (ref >59)
Glucose: 89 mg/dL (ref 65–99)
Potassium: 3.9 mmol/L (ref 3.5–5.2)
Sodium: 141 mmol/L (ref 134–144)

## 2019-03-25 LAB — TSH: TSH: 0.424 u[IU]/mL — ABNORMAL LOW (ref 0.450–4.500)

## 2019-03-25 LAB — VITAMIN B12: Vitamin B-12: 521 pg/mL (ref 232–1245)

## 2019-03-25 LAB — RPR: RPR Ser Ql: NONREACTIVE

## 2019-03-26 LAB — T4, FREE: Free T4: 1.63 ng/dL (ref 0.82–1.77)

## 2019-03-26 LAB — SPECIMEN STATUS REPORT

## 2019-03-27 DIAGNOSIS — Z03818 Encounter for observation for suspected exposure to other biological agents ruled out: Secondary | ICD-10-CM | POA: Diagnosis not present

## 2019-03-30 ENCOUNTER — Telehealth: Payer: Self-pay

## 2019-03-30 NOTE — Progress Notes (Signed)
Subjective:     Patient ID: Amanda Ross , female    DOB: [birth year] , 73 y.o.   MRN: [id number]   Chief Complaint  Patient presents with  . Hypertension    HPI  Hypertension This is a chronic problem. The current episode started more than 1 year ago. The problem has been gradually improving since onset. The problem is controlled. Pertinent negatives include no blurred vision, chest pain, palpitations or shortness of breath. Risk factors for coronary artery disease include obesity, post-menopausal state and sedentary lifestyle. The current treatment provides moderate improvement. Compliance problems include exercise.      Past Medical History:  Diagnosis Date  . Asthma   . GERD (gastroesophageal reflux disease)   . H/O bladder infections   . H/O measles   . H/O varicella   . Headache(784.0)    Frequently  . Hypertension   . OSA (obstructive sleep apnea)   . Osteoarthritis   . Yeast infection      Family History  Problem Relation Age of Onset  . Asthma Daughter   . Heart disease Mother        CHF  . Deep vein thrombosis Mother   . Rheum arthritis Mother   . Heart disease Brother   . Rheum arthritis Sister      Current Outpatient Medications:  .  aspirin 81 MG tablet, Take 81 mg by mouth daily., Disp: , Rfl:  .  baclofen (LIORESAL) 10 MG tablet, TAKE 1/2 TABLET BY MOUTH TWICE DAILY AS NEEDED, Disp: 30 tablet, Rfl: 0 .  Cholecalciferol (VITAMIN D) 1000 UNITS capsule, Take 2,000 Units by mouth daily. , Disp: , Rfl:  .  diclofenac sodium (VOLTAREN) 1 % GEL, Voltaren Gel 3 grams to 3 large joints upto TID 3 TUBES with 3 refills, Disp: 3 Tube, Rfl: 3 .  hydroxychloroquine (PLAQUENIL) 200 MG tablet, Take 1 tablet (200 mg total) by mouth every morning., Disp: 90 tablet, Rfl: 0 .  ipratropium (ATROVENT) 0.03 % nasal spray, Place 2 sprays into both nostrils every 12 (twelve) hours. (Patient taking differently: Place 2 sprays into both nostrils as needed. ), Disp: 30 mL,  Rfl: 12 .  losartan-hydrochlorothiazide (HYZAAR) 50-12.5 MG tablet, Take 1 tablet by mouth daily., Disp: 90 tablet, Rfl: 1 .  magnesium gluconate (MAGONATE) 500 MG tablet, Take 500 mg by mouth daily. , Disp: , Rfl:  .  metoprolol succinate (TOPROL-XL) 25 MG 24 hr tablet, TAKE 1 TABLET(25 MG) BY MOUTH DAILY, Disp: 90 tablet, Rfl: 0 .  mirabegron ER (MYRBETRIQ) 25 MG TB24 tablet, Take 1 tablet (25 mg total) by mouth daily., Disp: 1 tablet, Rfl: 0 .  omeprazole (PRILOSEC) 20 MG capsule, Take 1 capsule (20 mg total) by mouth daily., Disp: 90 capsule, Rfl: 1 .  oxybutynin (DITROPAN-XL) 10 MG 24 hr tablet, TK 1 T PO QD, Disp: , Rfl:  .  traZODone (DESYREL) 50 MG tablet, TAKE 1 TABLET BY MOUTH EVERY NIGHT AT BEDTIME, Disp: 90 tablet, Rfl: 0   Allergies  Allergen Reactions  . Ivp Dye [Iodinated Diagnostic Agents] Hives  . Iohexol   . Penicillins Hives  . Percocet [Oxycodone-Acetaminophen]   . Prednisone Nausea Only     Review of Systems  Constitutional: Negative.   Eyes: Negative for blurred vision.  Respiratory: Negative.  Negative for shortness of breath.   Cardiovascular: Negative.  Negative for chest pain and palpitations.  Gastrointestinal: Negative.   Neurological: Negative.        She c/o  memory issues. She is not sure what is contributing to her symptoms. No other family members have commented on this.   Psychiatric/Behavioral: Negative.      Today's Vitals   03/24/19 1423  BP: 134/78  Pulse: 76  Temp: 98.4 F (36.9 C)  TempSrc: Oral  SpO2: 97%  Weight: 224 lb (101.6 kg)  Height: 5' 2.2" (1.58 m)   Body mass index is 40.71 kg/m.   Objective:  Physical Exam Vitals signs and nursing note reviewed.  Constitutional:      Appearance: Normal appearance. She is obese.  HENT:     Head: Normocephalic and atraumatic.  Cardiovascular:     Rate and Rhythm: Normal rate and regular rhythm.     Heart sounds: Normal heart sounds.  Pulmonary:     Effort: Pulmonary effort is  normal.     Breath sounds: Normal breath sounds.  Skin:    General: Skin is warm.  Neurological:     General: No focal deficit present.     Mental Status: She is alert.  Psychiatric:        Mood and Affect: Mood normal.        Behavior: Behavior normal.         Assessment And Plan:     1. Hypertensive nephropathy  Chronic, fair control. She will continue with current meds. She is encouraged to avoid adding salt to her foods. She is also encouraged to incorporate more exercise into her daily routine. She is reminded chair exercises count toward these efforts. Additionally, she agrees to CCM referral.   - BMP8+EGFR - Referral to Chronic Care Management Services  2. Stage 3a chronic kidney disease   Chronic, yet stable. She is encouraged to stay well hydrated. Importance of getting bp to goal was also discussed with the patient.   - Referral to Chronic Care Management Services  3. Gait abnormality  I offered to schedule her for in-home PT, she declined. She wants to go home and think about it. Pt advised that PT will help to strengthen her lower body and should help to decrease her risk of falls.   4. Memory changes  I will check labs as listed below. She is encouraged to decrease her intake of refined carbs.   - Vitamin B12 - TSH - RPR - Referral to Chronic Care Management Services  5. Class 3 severe obesity due to excess calories with serious comorbidity and body mass index (BMI) of 40.0 to 44.9 in adult [facility])  Importance of achieving optimal weight to decrease risk of cardiovascular disease and cancers was discussed with the patient in full detail. She is encouraged to start slowly - start with 10 minutes twice daily at least three to four days per week and to gradually build to 30 minutes five days weekly. She was given tips to incorporate more activity into her daily routine - take stairs when possible, park farther away from grocery stores, etc.    6. Encounter for  screening mammogram for malignant neoplasm of breast  - MM Digital Screening; Future        Maximino Greenland, MD    THE PATIENT IS ENCOURAGED TO PRACTICE SOCIAL DISTANCING DUE TO THE COVID-19 PANDEMIC.

## 2019-03-31 ENCOUNTER — Other Ambulatory Visit: Payer: Self-pay | Admitting: Nurse Practitioner

## 2019-03-31 ENCOUNTER — Telehealth: Payer: Self-pay

## 2019-03-31 ENCOUNTER — Ambulatory Visit: Payer: Self-pay

## 2019-03-31 DIAGNOSIS — N1831 Chronic kidney disease, stage 3a: Secondary | ICD-10-CM

## 2019-03-31 DIAGNOSIS — I129 Hypertensive chronic kidney disease with stage 1 through stage 4 chronic kidney disease, or unspecified chronic kidney disease: Secondary | ICD-10-CM

## 2019-03-31 NOTE — Chronic Care Management (AMB) (Signed)
  Chronic Care Management   Outreach Note  03/31/2019 Name: Amanda Ross MRN: [id number] DOB: Aug 22, 1945  Referred by: [name], MD Reason for referral : Care Coordination   An unsuccessful telephone outreach was attempted today. The patient was referred to the case management team by for assistance with care management and care coordination.   Follow Up Plan: A HIPPA compliant phone message was left for the patient providing contact information and requesting a return call.  The care management team will reach out to the patient again over the next 10 days.   Daneen Schick, BSW, CDP Social Worker, Certified Dementia Practitioner [place] [facility] [place] [facility] [phone]

## 2019-04-06 ENCOUNTER — Ambulatory Visit: Payer: Self-pay

## 2019-04-06 ENCOUNTER — Ambulatory Visit (INDEPENDENT_AMBULATORY_CARE_PROVIDER_SITE_OTHER): Payer: Medicare Other

## 2019-04-06 DIAGNOSIS — R413 Other amnesia: Secondary | ICD-10-CM

## 2019-04-06 DIAGNOSIS — E559 Vitamin D deficiency, unspecified: Secondary | ICD-10-CM

## 2019-04-06 DIAGNOSIS — I129 Hypertensive chronic kidney disease with stage 1 through stage 4 chronic kidney disease, or unspecified chronic kidney disease: Secondary | ICD-10-CM

## 2019-04-06 DIAGNOSIS — N1831 Chronic kidney disease, stage 3a: Secondary | ICD-10-CM | POA: Diagnosis not present

## 2019-04-06 NOTE — Chronic Care Management (AMB) (Signed)
  Chronic Care Management   Initial Visit Note  04/06/2019 Name: Amanda Ross MRN: [id number] DOB: 09/08/1945  Referred by: [name], MD Reason for referral : Chronic Care Management (CCM RNCM Case Collaboration )   Amanda Ross is a 73 y.o. year old female who is a primary care patient of [name], MD. The care management team was consulted for assistance with chronic disease management and care coordination needs related to Resources for Dental Care.  Review of patient status, including review of consultants reports, relevant laboratory and other test results, and collaboration with appropriate care team members and the patient's provider was performed as part of comprehensive patient evaluation and provision of chronic care management services.    I initiated and established the plan of care for Amanda Ross during one on one collaboration with my clinical care management colleague Daneen Schick BSW who is also engaged with this patient to address social work needs.   Outpatient Encounter Medications as of 04/06/2019  Medication Sig  . aspirin 81 MG tablet Take 81 mg by mouth daily.  . baclofen (LIORESAL) 10 MG tablet TAKE 1/2 TABLET BY MOUTH TWICE DAILY AS NEEDED  . Cholecalciferol (VITAMIN D) 1000 UNITS capsule Take 2,000 Units by mouth daily.   . diclofenac sodium (VOLTAREN) 1 % GEL Voltaren Gel 3 grams to 3 large joints upto TID 3 TUBES with 3 refills  . hydroxychloroquine (PLAQUENIL) 200 MG tablet Take 1 tablet (200 mg total) by mouth every morning.  Marland Kitchen ipratropium (ATROVENT) 0.03 % nasal spray Place 2 sprays into both nostrils every 12 (twelve) hours. (Patient taking differently: Place 2 sprays into both nostrils as needed. )  . losartan-hydrochlorothiazide (HYZAAR) 50-12.5 MG tablet Take 1 tablet by mouth daily.  . magnesium gluconate (MAGONATE) 500 MG tablet Take 500 mg by mouth daily.   . metoprolol succinate (TOPROL-XL) 25 MG 24 hr tablet TAKE 1  TABLET(25 MG) BY MOUTH DAILY  . metoprolol tartrate (LOPRESSOR) 25 MG tablet TAKE 1/2 TABLET BY MOUTH TWICE DAILY  . mirabegron ER (MYRBETRIQ) 25 MG TB24 tablet Take 1 tablet (25 mg total) by mouth daily.  Marland Kitchen omeprazole (PRILOSEC) 20 MG capsule Take 1 capsule (20 mg total) by mouth daily.  Marland Kitchen oxybutynin (DITROPAN-XL) 10 MG 24 hr tablet TK 1 T PO QD  . traZODone (DESYREL) 50 MG tablet TAKE 1 TABLET BY MOUTH EVERY NIGHT AT BEDTIME   No facility-administered encounter medications on file as of 04/06/2019.      Goals Addressed    . Assist with Chronic Care Management and Care Coordination needs       Current Barriers:  Marland Kitchen Knowledge Barriers related to resources and support available to address needs related to Chronic Care Management and Care Coordination needs.   Case Manager Clinical Goal(s):  Marland Kitchen Over the next 30 days, patient will work with the CCM team to address needs related to Chronic Care Management and Care Coordination needs.   Interventions:  . Collaborated with BSW and initiated plan of care to address needs related to Resources for dental care.  Patient Self Care Activities:  . Attends all scheduled provider appointments . Calls provider office for new concerns or questions  Initial goal documentation         Telephone follow up appointment with care management team member scheduled for: 04/09/19  Barb Merino, RN, BSN, CCM Care Management Coordinator [place] [facility]  Direct Phone: [phone]

## 2019-04-07 NOTE — Chronic Care Management (AMB) (Signed)
Chronic Care Management     Social Work General Note  04/07/2019 Name: Amanda Ross MRN: [id number] DOB: 1945/06/28  Amanda Ross is a 73 y.o. year old female who is a primary care patient of [name], MD. The CCM was consulted to assist the patient with care management and care coordination.   Review of patient status, including review of consultants reports, relevant laboratory and other test results, and collaboration with appropriate care team members and the patient's provider was performed as part of comprehensive patient evaluation and provision of chronic care management services.    SDOH (Social Determinants of Health) screening performed today. See Care Plan Entry related to challenges with: Financial Strain related to dental services.   Outpatient Encounter Medications as of 04/06/2019  Medication Sig  . aspirin 81 MG tablet Take 81 mg by mouth daily.  . baclofen (LIORESAL) 10 MG tablet TAKE 1/2 TABLET BY MOUTH TWICE DAILY AS NEEDED  . Cholecalciferol (VITAMIN D) 1000 UNITS capsule Take 2,000 Units by mouth daily.   . diclofenac sodium (VOLTAREN) 1 % GEL Voltaren Gel 3 grams to 3 large joints upto TID 3 TUBES with 3 refills  . hydroxychloroquine (PLAQUENIL) 200 MG tablet Take 1 tablet (200 mg total) by mouth every morning.  Marland Kitchen ipratropium (ATROVENT) 0.03 % nasal spray Place 2 sprays into both nostrils every 12 (twelve) hours. (Patient taking differently: Place 2 sprays into both nostrils as needed. )  . losartan-hydrochlorothiazide (HYZAAR) 50-12.5 MG tablet Take 1 tablet by mouth daily.  . magnesium gluconate (MAGONATE) 500 MG tablet Take 500 mg by mouth daily.   . metoprolol succinate (TOPROL-XL) 25 MG 24 hr tablet TAKE 1 TABLET(25 MG) BY MOUTH DAILY  . metoprolol tartrate (LOPRESSOR) 25 MG tablet TAKE 1/2 TABLET BY MOUTH TWICE DAILY  . mirabegron ER (MYRBETRIQ) 25 MG TB24 tablet Take 1 tablet (25 mg total) by mouth daily.  Marland Kitchen omeprazole (PRILOSEC) 20 MG capsule  Take 1 capsule (20 mg total) by mouth daily.  Marland Kitchen oxybutynin (DITROPAN-XL) 10 MG 24 hr tablet TK 1 T PO QD  . traZODone (DESYREL) 50 MG tablet TAKE 1 TABLET BY MOUTH EVERY NIGHT AT BEDTIME   No facility-administered encounter medications on file as of 04/06/2019.     Goals Addressed            This Visit's Progress     Patient Stated   . "I need dental work but can't afford it" (pt-stated)       Current Barriers:  . Financial constraints related to the cost of dentures . COVID 19 Pandemic . Lack of dental coverage  Social Work Clinical Goal(s):  Marland Kitchen Over the next 45 days the patient will work with SW to identify dental resources available to the patient  CCM SW Interventions: Completed 04/06/2019 . Patient interviewed and appropriate assessments performed . Determined the patient is in need of dental work but denies having dental insurance "I need extractions and dentures" . Discussed patients ability to enroll in dental coverage during open enrollment - patient denies ability to afford an increase in monthly premium costs . Assessed for patients ability to access low cost services- the patient reports inability to afford any extra out of pocket costs at this time . Educated the patient on dental clinics in the area including dental schools and mobile clinics . Advised the patient of potential added barriers to accessing clinics due to COVID 19 restrictions . Scheduled outbound call to the patient in the next two  weeks to allow SW time to research dental resources on behalf of the patient  Patient Self Care Activities:  . Self administers medications as prescribed . Calls pharmacy for medication refills . Performs ADL's independently . Calls provider office for new concerns or questions  Initial goal documentation        Follow Up Plan: SW will follow up with patient by phone over the next two weeks       Daneen Schick, BSW, CDP Social Worker, Certified Dementia  Practitioner [place] [facility] [place] [facility] [phone]  Total time spent performing care coordination and/or care management activities with the patient by phone or face to face = 21 minutes.

## 2019-04-07 NOTE — Patient Instructions (Signed)
Social Worker Visit Information  Goals we discussed today:  Goals Addressed            This Visit's Progress     Patient Stated   . "I need dental work but can't afford it" (pt-stated)       Current Barriers:  . Financial constraints related to the cost of dentures . COVID 19 Pandemic . Lack of dental coverage  Social Work Clinical Goal(s):  Marland Kitchen Over the next 45 days the patient will work with SW to identify dental resources available to the patient  CCM SW Interventions: Completed 04/06/2019 . Patient interviewed and appropriate assessments performed . Determined the patient is in need of dental work but denies having dental insurance "I need extractions and dentures" . Discussed patients ability to enroll in dental coverage during open enrollment - patient denies ability to afford an increase in monthly premium costs . Assessed for patients ability to access low cost services- the patient reports inability to afford any extra out of pocket costs at this time . Educated the patient on dental clinics in the area including dental schools and mobile clinics . Advised the patient of potential added barriers to accessing clinics due to COVID 19 restrictions . Scheduled outbound call to the patient in the next two weeks to allow SW time to research dental resources on behalf of the patient  Patient Self Care Activities:  . Self administers medications as prescribed . Calls pharmacy for medication refills . Performs ADL's independently . Calls provider office for new concerns or questions  Initial goal documentation        Materials Provided: Verbal education about dental resources provided by phone  Follow Up Plan: SW will follow up with patient by phone over the next two weeks  Daneen Schick, BSW, CDP Social Worker, Certified Dementia Practitioner [place] [facility] [phone]

## 2019-04-09 ENCOUNTER — Telehealth: Payer: Medicare Other

## 2019-04-17 ENCOUNTER — Telehealth: Payer: Self-pay

## 2019-04-22 ENCOUNTER — Telehealth: Payer: Self-pay

## 2019-04-29 DIAGNOSIS — Z86010 Personal history of colonic polyps: Secondary | ICD-10-CM | POA: Diagnosis not present

## 2019-04-29 DIAGNOSIS — K5900 Constipation, unspecified: Secondary | ICD-10-CM | POA: Diagnosis not present

## 2019-04-29 DIAGNOSIS — K219 Gastro-esophageal reflux disease without esophagitis: Secondary | ICD-10-CM | POA: Diagnosis not present

## 2019-04-30 ENCOUNTER — Ambulatory Visit: Payer: Medicare Other

## 2019-04-30 DIAGNOSIS — N1831 Chronic kidney disease, stage 3a: Secondary | ICD-10-CM

## 2019-04-30 NOTE — Chronic Care Management (AMB) (Signed)
Chronic Care Management    Social Work Follow Up Note  04/30/2019 Name: TEEGHAN Ross MRN: [id number] DOB: 11/07/1945  Amanda Ross is a 73 y.o. year old female who is a primary care patient of [name], MD. The CCM team was consulted for assistance with care coordination.   Review of patient status, including review of consultants reports, other relevant assessments, and collaboration with appropriate care team members and the patient's provider was performed as part of comprehensive patient evaluation and provision of chronic care management services.    SW placed an outbound call to the patient to review resources for dental work. See care plan below.  Outpatient Encounter Medications as of 04/30/2019  Medication Sig  . aspirin 81 MG tablet Take 81 mg by mouth daily.  . baclofen (LIORESAL) 10 MG tablet TAKE 1/2 TABLET BY MOUTH TWICE DAILY AS NEEDED  . Cholecalciferol (VITAMIN D) 1000 UNITS capsule Take 2,000 Units by mouth daily.   . diclofenac sodium (VOLTAREN) 1 % GEL Voltaren Gel 3 grams to 3 large joints upto TID 3 TUBES with 3 refills  . hydroxychloroquine (PLAQUENIL) 200 MG tablet Take 1 tablet (200 mg total) by mouth every morning.  Marland Kitchen ipratropium (ATROVENT) 0.03 % nasal spray Place 2 sprays into both nostrils every 12 (twelve) hours. (Patient taking differently: Place 2 sprays into both nostrils as needed. )  . losartan-hydrochlorothiazide (HYZAAR) 50-12.5 MG tablet Take 1 tablet by mouth daily.  . magnesium gluconate (MAGONATE) 500 MG tablet Take 500 mg by mouth daily.   . metoprolol succinate (TOPROL-XL) 25 MG 24 hr tablet TAKE 1 TABLET(25 MG) BY MOUTH DAILY  . metoprolol tartrate (LOPRESSOR) 25 MG tablet TAKE 1/2 TABLET BY MOUTH TWICE DAILY  . mirabegron ER (MYRBETRIQ) 25 MG TB24 tablet Take 1 tablet (25 mg total) by mouth daily.  Marland Kitchen omeprazole (PRILOSEC) 20 MG capsule Take 1 capsule (20 mg total) by mouth daily.  Marland Kitchen oxybutynin (DITROPAN-XL) 10 MG 24 hr tablet TK  1 T PO QD  . traZODone (DESYREL) 50 MG tablet TAKE 1 TABLET BY MOUTH EVERY NIGHT AT BEDTIME   No facility-administered encounter medications on file as of 04/30/2019.      Goals Addressed            This Visit's Progress     Patient Stated   . "I need dental work but can't afford it" (pt-stated)       Current Barriers:  . Financial constraints related to the cost of dentures . COVID 19 Pandemic . Lack of dental coverage  Social Work Clinical Goal(s):  Marland Kitchen Over the next 45 days the patient will work with SW to identify dental resources available to the patient  CCM SW Interventions: Completed 04/30/2019 . Outbound call to the patient to discuss resource barriers . Discussed  all free traveling dental clinics cancelled at this time due to [place] 19 pandemic . Advised the patient SW has reached out to [employer] [facility] to inquire if accepting patients at this time . Reviewed patients ability to apply for Care Credit to receive dental care o Patient acknowledges ability to pay 70-90 dollars per month for dental work o Discussed patients lack of established dental provider due to always accessing clinics . Provided the patient with low cost dental clinic information to "[facility]"  o Patient reports already contacting them and being told it would cost approximately $3,000 up front to receive dentures . Scheduled outbound call to the patient in the month to  review dental school response  Patient Self Care Activities:  . Self administers medications as prescribed . Calls pharmacy for medication refills . Performs ADL's independently . Calls provider office for new concerns or questions  Please see past updates related to this goal by clicking on the "Past Updates" button in the selected goal         Follow Up Plan: SW will follow up with patient by phone over the next month   Daneen Schick, BSW, CDP Social Worker, Certified Dementia Practitioner [place] [facility] [place] [facility]  [phone]  Total time spent performing care coordination and/or care management activities with the patient by phone or face to face = 10 minutes.

## 2019-04-30 NOTE — Patient Instructions (Signed)
Social Worker Visit Information  Goals we discussed today:  Goals Addressed            This Visit's Progress     Patient Stated   . "I need dental work but can't afford it" (pt-stated)       Current Barriers:  . Financial constraints related to the cost of dentures . COVID 19 Pandemic . Lack of dental coverage  Social Work Clinical Goal(s):  Marland Kitchen Over the next 45 days the patient will work with SW to identify dental resources available to the patient  CCM SW Interventions: Completed 04/30/2019 . Outbound call to the patient to discuss resource barriers . Discussed  all free traveling dental clinics cancelled at this time due to [place] 19 pandemic . Advised the patient SW has reached out to [facility] [employer] to inquire if accepting patients at this time . Reviewed patients ability to apply for Care Credit to receive dental care o Patient acknowledges ability to pay 70-90 dollars per month for dental work o Discussed patients lack of established dental provider due to always accessing clinics . Provided the patient with low cost dental clinic information to "[facility]"  o Patient reports already contacting them and being told it would cost approximately $3,000 up front to receive dentures . Scheduled outbound call to the patient in the month to review dental school response  Patient Self Care Activities:  . Self administers medications as prescribed . Calls pharmacy for medication refills . Performs ADL's independently . Calls provider office for new concerns or questions  Please see past updates related to this goal by clicking on the "Past Updates" button in the selected goal        Follow Up Plan: SW will follow up with patient by phone over the next month   Daneen Schick, BSW, CDP Social Worker, Certified Dementia Practitioner [place] [facility] [place] [facility] [phone]

## 2019-05-05 NOTE — Progress Notes (Deleted)
Office Visit Note  Patient: Amanda Ross             Date of Birth: 1945-09-30           MRN: [id number]             PCP: [name], MD Referring: [name], MD Visit Date: 05/19/2019 Occupation: @GUAROCC @  Subjective:  No chief complaint on file.   History of Present Illness: Amanda Ross is a 73 y.o. female ***   Activities of Daily Living:  Patient reports morning stiffness for *** {minute/hour:19697}.   Patient {ACTIONS;DENIES/REPORTS:21021675::"Denies"} nocturnal pain.  Difficulty dressing/grooming: {ACTIONS;DENIES/REPORTS:21021675::"Denies"} Difficulty climbing stairs: {ACTIONS;DENIES/REPORTS:21021675::"Denies"} Difficulty getting out of chair: {ACTIONS;DENIES/REPORTS:21021675::"Denies"} Difficulty using hands for taps, buttons, cutlery, and/or writing: {ACTIONS;DENIES/REPORTS:21021675::"Denies"}  No Rheumatology ROS completed.   PMFS History:  Patient Active Problem List   Diagnosis Date Noted  . Vitamin D deficiency, unspecified 03/05/2018  . Mixed hyperlipidemia 03/05/2018  . 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 Pain in unspecified joint 03/05/2018  . Abnormal glucose 03/05/2018  . Trochanteric bursitis of both hips 07/30/2017  . Sjogren's syndrome ([place]) 05/22/2016  . Sicca syndrome, unspecified ([place]) 05/22/2016  . High risk medication use 05/22/2016  . Primary hyperparathyroidism ([place]) 07/09/2013  . Left thyroid nodule 07/06/2013  . Dyspnea 10/11/2010    Past Medical History:  Diagnosis Date  . Asthma   . GERD (gastroesophageal reflux disease)   . H/O bladder infections   . H/O measles   . H/O varicella   . Headache(784.0)    Frequently  . Hypertension   . OSA (obstructive sleep apnea)   . Osteoarthritis   . Yeast infection     Family History  Problem Relation Age of Onset  . Asthma Daughter   . Heart disease Mother        CHF  . Deep vein thrombosis Mother   . Rheum arthritis Mother   . Heart disease  Brother   . Rheum arthritis Sister    Past Surgical History:  Procedure Laterality Date  . BUNIONECTOMY    . CARPAL TUNNEL RELEASE    . TOTAL SHOULDER ARTHROPLASTY     Social History   Social History Narrative  . Not on file   Immunization History  Administered Date(s) Administered  . Influenza Split 03/25/2013  . Influenza, High Dose Seasonal PF 03/19/2018, 02/26/2019  . Pneumococcal-Unspecified 04/11/2012     Objective: Vital Signs: There were no vitals taken for this visit.   Physical Exam   Musculoskeletal Exam: ***  CDAI Exam: CDAI Score: - Patient Global: -; Provider Global: - Swollen: -; Tender: - Joint Exam   No joint exam has been documented for this visit   There is currently no information documented on the homunculus. Go to the Rheumatology activity and complete the homunculus joint exam.  Investigation: No additional findings.  Imaging: No results found.  Recent Labs: Lab Results  Component Value Date   WBC 8.6 12/15/2018   HGB 13.4 12/15/2018   PLT 227 12/15/2018   NA 141 03/24/2019   K 3.9 03/24/2019   CL 104 03/24/2019   CO2 24 03/24/2019   GLUCOSE 89 03/24/2019   BUN 19 03/24/2019   CREATININE 1.27 (H) 03/24/2019   BILITOT 0.3 12/15/2018   ALKPHOS 83 04/02/2018   AST 18 12/15/2018   ALT 17 12/15/2018   PROT 6.7 01/07/2019   ALBUMIN 4.1 04/02/2018   CALCIUM 11.2 (H) 03/24/2019   GFRAA 48 (L) 03/24/2019  Speciality Comments: PLQ eye exam: 01/05/19 Normal. [facility]. Follow up in 6 months.  Procedures:  No procedures performed Allergies: Ivp dye [iodinated diagnostic agents], Iohexol, Penicillins, Percocet [oxycodone-acetaminophen], and Prednisone   Assessment / Plan:     Visit Diagnoses: No diagnosis found.  Orders: No orders of the defined types were placed in this encounter.  No orders of the defined types were placed in this encounter.   Face-to-face time spent with patient was *** minutes. Greater than  50% of time was spent in counseling and coordination of care.  Follow-Up Instructions: No follow-ups on file.   Earnestine Mealing, CMA  Note - This record has been created using Editor, commissioning.  Chart creation errors have been sought, but may not always  have been located. Such creation errors do not reflect on  the standard of medical care.

## 2019-05-11 ENCOUNTER — Telehealth: Payer: Self-pay

## 2019-05-11 NOTE — Telephone Encounter (Signed)
The pt was scheduled an appt for breast pain

## 2019-05-18 ENCOUNTER — Telehealth (INDEPENDENT_AMBULATORY_CARE_PROVIDER_SITE_OTHER): Payer: Medicare Other | Admitting: Rheumatology

## 2019-05-18 ENCOUNTER — Encounter: Payer: Self-pay | Admitting: Nurse Practitioner

## 2019-05-18 ENCOUNTER — Telehealth: Payer: Self-pay

## 2019-05-18 ENCOUNTER — Other Ambulatory Visit: Payer: Self-pay

## 2019-05-18 ENCOUNTER — Encounter: Payer: Self-pay | Admitting: Rheumatology

## 2019-05-18 ENCOUNTER — Ambulatory Visit (INDEPENDENT_AMBULATORY_CARE_PROVIDER_SITE_OTHER): Payer: Medicare Other | Admitting: Nurse Practitioner

## 2019-05-18 VITALS — BP 118/80 | HR 80 | Temp 98.7°F | Ht 63.2 in | Wt 222.6 lb

## 2019-05-18 DIAGNOSIS — N644 Mastodynia: Secondary | ICD-10-CM

## 2019-05-18 DIAGNOSIS — M5030 Other cervical disc degeneration, unspecified cervical region: Secondary | ICD-10-CM

## 2019-05-18 DIAGNOSIS — M3509 Sicca syndrome with other organ involvement: Secondary | ICD-10-CM | POA: Diagnosis not present

## 2019-05-18 DIAGNOSIS — Z79899 Other long term (current) drug therapy: Secondary | ICD-10-CM | POA: Diagnosis not present

## 2019-05-18 DIAGNOSIS — M7061 Trochanteric bursitis, right hip: Secondary | ICD-10-CM

## 2019-05-18 DIAGNOSIS — M7062 Trochanteric bursitis, left hip: Secondary | ICD-10-CM

## 2019-05-18 DIAGNOSIS — M4802 Spinal stenosis, cervical region: Secondary | ICD-10-CM

## 2019-05-18 DIAGNOSIS — E210 Primary hyperparathyroidism: Secondary | ICD-10-CM

## 2019-05-18 MED ORDER — DICLOFENAC SODIUM 1 % EX GEL: CUTANEOUS | 0 refills | Status: AC

## 2019-05-18 NOTE — Progress Notes (Signed)
Virtual Visit via Telephone Note  I connected with Amanda Ross on 05/18/19 at  2:00 PM EST by telephone and verified that I am speaking with the correct person using two identifiers.  Location: Patient: Home  Provider: Clinic  This service was conducted via virtual visit.  The patient was located at home. I was located in my office.  Consent was obtained prior to the virtual visit and is aware of possible charges through their insurance for this visit.  The patient is an established patient.  Dr. Estanislado Pandy, MD conducted the virtual visit and Hazel Sams, PA-C acted as scribe during the service.  Office staff helped with scheduling follow up visits after the service was conducted.   I discussed the limitations, risks, security and privacy concerns of performing an evaluation and management service by telephone and the availability of in person appointments. I also discussed with the patient that there may be a patient responsible charge related to this service. The patient expressed understanding and agreed to proceed.  CC: Sicca symptoms  History of Present Illness: Patient is a 73 year old female with a past medical history of Sjogren's and DDD.  She takes plaquenil 200 mg 1 tablet by mouth daily.  She has chronic sicca symptoms.  She has been having worsening mouth dryness recently.  She tries to drink water throughout the day and uses a lozenge occasionally to help with moisture.  She has intermittent trochanteric bursitis, which is exacerbated by standing for prolonged periods of time.  She continues to have chronic neck pain and stiffness, which has been worse for the past few days.  She takes tylenol for pain relief. She would like a refill of voltaren gel.   Review of Systems  Constitutional: Negative for fever and malaise/fatigue.  HENT:       +Dry mouth  Eyes: Negative for photophobia, pain, discharge and redness.       +Dry eyes  Respiratory: Negative for cough, shortness of  breath and wheezing.   Cardiovascular: Negative for chest pain and palpitations.  Gastrointestinal: Negative for blood in stool, constipation and diarrhea.  Genitourinary: Negative for dysuria.  Musculoskeletal: Positive for joint pain and neck pain. Negative for back pain and myalgias.       +Morning stiffness   Skin: Negative for rash.  Neurological: Negative for dizziness and headaches.  Psychiatric/Behavioral: Negative for depression. The patient is not nervous/anxious and does not have insomnia.       Observations/Objective: Physical Exam  Constitutional: She is oriented to person, place, and time.  Neurological: She is alert and oriented to person, place, and time.  Psychiatric: Mood, memory, affect and judgment normal.   Patient reports morning stiffness for 20-30 minutes.   Patient denies nocturnal pain.  Difficulty dressing/grooming: Denies Difficulty climbing stairs: Reports Difficulty getting out of chair: Denies Difficulty using hands for taps, buttons, cutlery, and/or writing: Denies   Assessment and Plan: Visit Diagnoses: Sjogren's syndrome with other organ involvement ([place]) -She continues to have chronic sicca symptoms.  She experiences significant mouth dryness and drinks fluids and uses lozenges throughout the day for symptomatic relief. OTC products were discussed.  She continues to take plaquenil 200 mg 1 tablet by mouth daily.  She does not need any refills at this time.  She will follow up in 5 months.    High risk medication use - Plaquenil 200 mg 1 tablet by mouth daily.  CBC and CMP were drawn on 12/15/18.  BMP updated on 03/24/19.  She is due to update lab work.  Labs released and standing orders are in place.  PLQ eye exam: 01/05/19 Normal. [facility]. Follow up in 6 months.    Trochanteric bursitis of both hips -She has intermittent trochanteric bursitis bilaterally. Her symptoms have been exacerbated by standing for prolonged periods of time. She  requested a refill of voltaren gel topically as needed for pain relief.   DDD (degenerative disc disease), cervical - She has chronic neck pain and stiffness.  She is having right trapezius muscle tension and tenderness.  She is not having any symptoms of radiculopathy at this time.  We will send her a handout of neck exercises to perform.   Spinal stenosis of cervical region -She has chronic neck pain.   Primary hyperparathyroidism (H[facility])   Cervical lymphadenopathy - Resolved.    Follow Up Instructions: She will follow up in  Mail neck exercises.    I discussed the assessment and treatment plan with the patient. The patient was provided an opportunity to ask questions and all were answered. The patient agreed with the plan and demonstrated an understanding of the instructions.   The patient was advised to call back or seek an in-person evaluation if the symptoms worsen or if the condition fails to improve as anticipated.  I provided 25 minutes of non-face-to-face time during this encounter.   Bo Merino, MD   Scribed by-  Hazel Sams, PA-C

## 2019-05-18 NOTE — Progress Notes (Signed)
Subjective:     Patient ID: Amanda Ross , female    DOB: [birth year] , 73 y.o.   MRN: [id number]   Chief Complaint  Patient presents with  . Breast Pain    patient stated she had breast pain in her right breast about 2 weeks ago. she denies any pain today    HPI  Right breast nipple pain 2 days about 1.5 weeks ago.  She had difficulty with going to sleep.  Denies any discharge.  Pain lasted for 2 days.  No medications.  She had a lump in her left breast with biopsy was negative. Resolved on its own     Past Medical History:  Diagnosis Date  . Asthma   . GERD (gastroesophageal reflux disease)   . H/O bladder infections   . H/O measles   . H/O varicella   . Headache(784.0)    Frequently  . Hypertension   . OSA (obstructive sleep apnea)   . Osteoarthritis   . Yeast infection      Family History  Problem Relation Age of Onset  . Asthma Daughter   . Heart disease Mother        CHF  . Deep vein thrombosis Mother   . Rheum arthritis Mother   . Heart disease Brother   . Rheum arthritis Sister      Current Outpatient Medications:  .  aspirin 81 MG tablet, Take 81 mg by mouth daily., Disp: , Rfl:  .  baclofen (LIORESAL) 10 MG tablet, TAKE 1/2 TABLET BY MOUTH TWICE DAILY AS NEEDED, Disp: 30 tablet, Rfl: 0 .  Cholecalciferol (VITAMIN D) 1000 UNITS capsule, Take 2,000 Units by mouth daily. , Disp: , Rfl:  .  diclofenac sodium (VOLTAREN) 1 % GEL, Voltaren Gel 3 grams to 3 large joints upto TID 3 TUBES with 3 refills, Disp: 3 Tube, Rfl: 3 .  hydroxychloroquine (PLAQUENIL) 200 MG tablet, Take 1 tablet (200 mg total) by mouth every morning., Disp: 90 tablet, Rfl: 0 .  ipratropium (ATROVENT) 0.03 % nasal spray, Place 2 sprays into both nostrils every 12 (twelve) hours. (Patient taking differently: Place 2 sprays into both nostrils as needed. ), Disp: 30 mL, Rfl: 12 .  losartan-hydrochlorothiazide (HYZAAR) 50-12.5 MG tablet, Take 1 tablet by mouth daily., Disp: 90 tablet, Rfl:  1 .  magnesium gluconate (MAGONATE) 500 MG tablet, Take 500 mg by mouth daily. , Disp: , Rfl:  .  metoprolol tartrate (LOPRESSOR) 25 MG tablet, TAKE 1/2 TABLET BY MOUTH TWICE DAILY, Disp: 90 tablet, Rfl: 1 .  mirabegron ER (MYRBETRIQ) 25 MG TB24 tablet, Take 1 tablet (25 mg total) by mouth daily., Disp: 1 tablet, Rfl: 0 .  omeprazole (PRILOSEC) 20 MG capsule, Take 1 capsule (20 mg total) by mouth daily., Disp: 90 capsule, Rfl: 1 .  oxybutynin (DITROPAN-XL) 10 MG 24 hr tablet, TK 1 T PO QD, Disp: , Rfl:  .  traZODone (DESYREL) 50 MG tablet, TAKE 1 TABLET BY MOUTH EVERY NIGHT AT BEDTIME, Disp: 90 tablet, Rfl: 0   Allergies  Allergen Reactions  . Ivp Dye [Iodinated Diagnostic Agents] Hives  . Iohexol   . Penicillins Hives  . Percocet [Oxycodone-Acetaminophen]   . Prednisone Nausea Only     Review of Systems  Constitutional: Negative.   Respiratory: Negative.   Cardiovascular: Negative.  Negative for chest pain, palpitations and leg swelling.  Genitourinary:       Right breast nipple pain about 2 weeks ago     Today's  Vitals   05/18/19 1051  BP: 118/80  Pulse: 80  Temp: 98.7 F (37.1 C)  TempSrc: Oral  Weight: 222 lb 9.6 oz (101 kg)  Height: 5' 3.2" (1.605 m)  PainSc: 0-No pain   Body mass index is 39.18 kg/m.   Objective:  Physical Exam Constitutional:      General: She is not in acute distress.    Appearance: Normal appearance.  Cardiovascular:     Rate and Rhythm: Normal rate.     Pulses: Normal pulses.     Heart sounds: Normal heart sounds. No murmur.  Pulmonary:     Effort: Pulmonary effort is normal. No respiratory distress.     Breath sounds: Normal breath sounds.  Skin:    Capillary Refill: Capillary refill takes less than 2 seconds.  Neurological:     General: No focal deficit present.     Mental Status: She is alert and oriented to person, place, and time.     Cranial Nerves: No cranial nerve deficit.  Psychiatric:        Mood and Affect: Mood normal.         Behavior: Behavior normal.        Thought Content: Thought content normal.        Judgment: Judgment normal.         Assessment And Plan:     1. Breast pain, right  Had pain to right breast 2 weeks ago  Ordered a diagnostic mammogram, no abnormal findings on exam - MM Digital Diagnostic Unilat R; Future   Minette Brine, FNP    THE PATIENT IS ENCOURAGED TO PRACTICE SOCIAL DISTANCING DUE TO THE COVID-19 PANDEMIC.

## 2019-05-19 ENCOUNTER — Ambulatory Visit: Payer: Medicare Other | Admitting: Rheumatology

## 2019-05-20 ENCOUNTER — Ambulatory Visit (INDEPENDENT_AMBULATORY_CARE_PROVIDER_SITE_OTHER): Payer: Medicare Other

## 2019-05-20 ENCOUNTER — Telehealth: Payer: Self-pay

## 2019-05-20 ENCOUNTER — Telehealth: Payer: Medicare Other

## 2019-05-20 ENCOUNTER — Other Ambulatory Visit: Payer: Self-pay

## 2019-05-20 DIAGNOSIS — N1831 Chronic kidney disease, stage 3a: Secondary | ICD-10-CM | POA: Diagnosis not present

## 2019-05-20 DIAGNOSIS — M3500 Sicca syndrome, unspecified: Secondary | ICD-10-CM

## 2019-05-20 DIAGNOSIS — R413 Other amnesia: Secondary | ICD-10-CM

## 2019-05-20 DIAGNOSIS — I129 Hypertensive chronic kidney disease with stage 1 through stage 4 chronic kidney disease, or unspecified chronic kidney disease: Secondary | ICD-10-CM

## 2019-05-20 NOTE — Patient Instructions (Signed)

## 2019-05-21 ENCOUNTER — Ambulatory Visit: Payer: Self-pay

## 2019-05-21 ENCOUNTER — Telehealth: Payer: Self-pay

## 2019-05-21 DIAGNOSIS — N1831 Chronic kidney disease, stage 3a: Secondary | ICD-10-CM

## 2019-05-21 DIAGNOSIS — K635 Polyp of colon: Secondary | ICD-10-CM | POA: Diagnosis not present

## 2019-05-21 DIAGNOSIS — Z86010 Personal history of colonic polyps: Secondary | ICD-10-CM | POA: Diagnosis not present

## 2019-05-21 DIAGNOSIS — K5730 Diverticulosis of large intestine without perforation or abscess without bleeding: Secondary | ICD-10-CM | POA: Diagnosis not present

## 2019-05-21 DIAGNOSIS — D123 Benign neoplasm of transverse colon: Secondary | ICD-10-CM | POA: Diagnosis not present

## 2019-05-21 NOTE — Chronic Care Management (AMB) (Signed)
  Chronic Care Management   Outreach Note  05/21/2019 Name: Amanda Ross MRN: [id number] DOB: September 25, 1945  Referred by: [name], MD Reason for referral : Care Coordination   SW placed a successful outbound call to the patient to review dental resources. Upon call initiation SW noticed the patient to be very sleepy. The patient reports resting after a colonoscopy being performed earlier in the day. SW encouraged the patient to get some rest and advised the patient SW would reschedule call to next week.  Follow Up Plan: The care management team will reach out to the patient again over the next 6 days.   Daneen Schick, BSW, CDP Social Worker, Certified Dementia Practitioner [facility] [phone]

## 2019-05-21 NOTE — Chronic Care Management (AMB) (Signed)
Chronic Care Management   Initial Visit Note  05/20/2019 Name: ZAHIRAH NORGREN MRN: [id number] DOB: Nov 14, 1945  Referred by: [name], MD Reason for referral : Chronic Care Management ([facility] )   KERON ERKKILA is a 73 y.o. year old female who is a primary care patient of [name], MD. The CCM team was consulted for assistance with chronic disease management and care coordination needs related to HTN, CKD Stage III and Chronic neck and back pain; Impaired Swallowing; Fatigue  Review of patient status, including review of consultants reports, relevant laboratory and other test results, and collaboration with appropriate care team members and the patient's provider was performed as part of comprehensive patient evaluation and provision of chronic care management services.    SDOH (Social Determinants of Health) screening performed today: None. See Care Plan for related entries.   Placed outbound initial call to patient to assess for CCM RN CM needs and a care plan was established.   Medications: Outpatient Encounter Medications as of 05/20/2019  Medication Sig  . baclofen (LIORESAL) 10 MG tablet TAKE 1/2 TABLET BY MOUTH TWICE DAILY AS NEEDED  . traZODone (DESYREL) 50 MG tablet TAKE 1 TABLET BY MOUTH EVERY NIGHT AT BEDTIME  . aspirin 81 MG tablet Take 81 mg by mouth daily.  . Cholecalciferol (VITAMIN D) 1000 UNITS capsule Take 2,000 Units by mouth daily.   . diclofenac Sodium (VOLTAREN) 1 % GEL Apply 2 g to 4 g to affected area up to 4 times daily as needed.  . hydroxychloroquine (PLAQUENIL) 200 MG tablet Take 1 tablet (200 mg total) by mouth every morning.  Marland Kitchen ipratropium (ATROVENT) 0.03 % nasal spray Place 2 sprays into both nostrils every 12 (twelve) hours. (Patient taking differently: Place 2 sprays into both nostrils as needed. )  . losartan-hydrochlorothiazide (HYZAAR) 50-12.5 MG tablet Take 1 tablet by mouth daily.  . magnesium gluconate  (MAGONATE) 500 MG tablet Take 500 mg by mouth daily.   . metoprolol tartrate (LOPRESSOR) 25 MG tablet TAKE 1/2 TABLET BY MOUTH TWICE DAILY  . mirabegron ER (MYRBETRIQ) 25 MG TB24 tablet Take 1 tablet (25 mg total) by mouth daily.  Marland Kitchen omeprazole (PRILOSEC) 20 MG capsule Take 1 capsule (20 mg total) by mouth daily.  Marland Kitchen oxybutynin (DITROPAN-XL) 10 MG 24 hr tablet TK 1 T PO QD   No facility-administered encounter medications on file as of 05/20/2019.      Objective:  Lab Results  Component Value Date   HGBA1C 5.5 02/19/2018   Lab Results  Component Value Date   CREATININE 1.27 (H) 03/24/2019   BP Readings from Last 3 Encounters:  05/18/19 118/80  03/24/19 134/78  02/26/19 132/82    Goals Addressed      Patient Stated   . "I would like to get my neck pain under better control" (pt-stated)       Current Barriers:  . Chronic Disease Management support and education needs related to chronic neck and back pain  . Trochanteric bursitis of both hips . Spinal stenosis of cervical region . DDD (degenerative disc disease), cervical . Sjrogrens   Nurse Case Manager Clinical Goal(s):  Marland Kitchen Over the next 60 days, patient will work with the PCP and Rheumatologist to address needs related to chronic neck and back pain . Over the next 90 days, patient will experience no ED visit and or IP events secondary to uncontrolled or worsening neck/back pain  CCM RN CM Interventions:  05/20/19 call completed  with patient   . Evaluation of current treatment plan related to Chronic Neck Pain  and patient's adherence to plan as established by provider. . Advised patient to keep Dr. Baird Cancer and or her Rheumatologist of new, worsening or severe and persistent pain that is not relieved with her current regimen; discussed this condition is being monitored and followed by Dr. Rosann Auerbach   . Provided education to patient re: benefits of working with a PT to help teach a safe and effective HEP to help with  strengthening, improve ROM and help reduce pain/discomfort . Reviewed medications with patient and discussed patient is taking Plaquenil for Sjrogrens associated symptoms . Discussed plans with patient for ongoing care management follow up and provided patient with direct contact information for care management team  Patient Self Care Activities:  . Patient verbalizes understanding of plan to contact the CCM team, Dr. Baird Cancer or Rheumatology for severe, new or worsening persistent neck/back pain not relieved by current regimen  . Self administers medications as prescribed . Attends all scheduled provider appointments . Calls pharmacy for medication refills . Performs ADL's independently . Performs IADL's independently . Calls provider office for new concerns or questions   Initial goal documentation     . "I would like to have less fatigue" (pt-stated)       Current Barriers:  Marland Kitchen Knowledge Deficits related to diagnosis and treatment of increased fatigue . Chronic Disease Management support and education needs related to fatigue  Nurse Case Manager Clinical Goal(s):  Marland Kitchen Over the next 60 days, patient will work with the CCM team and PCP to address needs related to evaluation and treatment of increased fatigue  CCM RN CM Interventions:  05/20/19 call completed with patient   . Evaluation of current treatment plan related to increased fatigue and patient's adherence to plan as established by provider. . Advised patient to keep the CCM RN and PCP well informed if this condition worsens; patient encouraged to implement daily exercise in her routine even if for 5-10 minutes per day and to balance her activity with plenty of rest; patient encouraged to perform her daily routines during the most energetic part of her day and to stay well hydrated, eat a well balanced meal and get plenty of sleep/rest . Provided education to patient re: the importance of drinking enough water to help prevent fatigue   . Reviewed medications with patient and discussed potential SE to some of the medications she is prescribed to take can cause and or worsen her fatigue . Collaborated with PCP, Dr. Baird Cancer via in basket message regarding patient's complaints of worsening fatigue and request for Dr. Baird Cancer to make recommendations regarding her abnormal TSH . Discussed plans with patient for ongoing care management follow up and provided patient with direct contact information for care management team  Patient Self Care Activities:  . Self administers medications as prescribed . Attends all scheduled provider appointments . Calls pharmacy for medication refills . Performs ADL's independently . Performs IADL's independently . Calls provider office for new concerns or questions  Initial goal documentation     . "I would like to know why I feel that my food is getting stuck in my esophagus" (pt-stated)       Current Barriers:  Marland Kitchen Knowledge Deficits related to diagnosis and treatment of impaired swallowing  Nurse Case Manager Clinical Goal(s):  Marland Kitchen Over the next 90 days, patient will verbalize understanding of plan for treatment of impaired swallowing  . Over the next 90  days, patient will work with the PCP and or GI Specialist to address needs related to Impaired Swallowing or esophageal narrowing   CCM RN CM Interventions:  05/20/19 call completed with patient   . Evaluation of current treatment plan related to Impaired Swallowing and patient's adherence to plan as established by provider. . Provided education to patient re: potential causes of Impaired swallowing and feeling as her pills or food are stuck in her esophagus including having developed a narrowed esophagus and or increased irritation related GERD; educated patient on diagnosis and treatment options for this condition if detected; patient denies having difficulty with swallowing liquids or food during meal times; she denies having prior issues with  esophageal narrowing; she denies having choking or strangling episodes . Reviewed medications with patient and discussed patient is adhering to taking Omeprazole exactly as prescribed . Collaborated with PCP, Dr. [name] MD via in basket message regarding patients reported symptoms . Discussed plans with patient for ongoing care management follow up and provided patient with direct contact information for care management team  Patient Self Care Activities:  . Self administers medications as prescribed . Attends all scheduled provider appointments . Calls pharmacy for medication refills . Performs ADL's independently . Performs IADL's independently . Calls provider office for new concerns or questions   Initial goal documentation     . "I would like to learn why I am having dizzy spells" (pt-stated)       Current Barriers:  Marland Kitchen Knowledge Deficits related to diagnosis and treatment of Vertigo  Nurse Case Manager Clinical Goal(s):  Marland Kitchen Over the next 60 days, patient will work with the CCM team and PCP to address needs related to evaluation and treatment of Vertigo . Over the next 90 days, patient will experience no falls secondary to Vertigo  CCM RN CM Interventions:  05/20/19 call completed with patient   . Evaluation of current treatment plan related to Vertigo and patient's adherence to plan as established by provider. . Advised patient to take her time when changing positions from sitting to standing to allow her BP time to adjust and to avoid bending over is possible . Provided education to patient re: benefits from Vestibular PT pending outcome of medication review with Pharm D . Reviewed medications with patient and discussed indication, dosage and frequency of her prescribed hypertensive medications; discussed indication, dosage and frequency of Trazodone prescribed for insomnia; discussed and reviewed that some antihypertensive medications can cause drowsiness or dizziness;  verbal education provided related to potential "hang over" effect that can occur from the Trazodone; discussed patient's dizziness is more severe in the am but that she also experience's intermittent episodes of sudden dizziness w/o warning and at random times of the day . Collaborated with embedded Pharm D Lottie Dawson and PCP via in basket message  regarding patient c/o vertigo and requested Pharmacist outreach to further evaluate patient's medication regimen for potential medication induced association . Discussed plans with patient for ongoing care management follow up and provided patient with direct contact information for care management team  Patient Self Care Activities:  . Self administers medications as prescribed . Attends all scheduled provider appointments . Calls pharmacy for medication refills . Performs ADL's independently . Performs IADL's independently . Calls provider office for new concerns or questions  Initial goal documentation     . I would like to learn more about Chronic Kidney disease" (pt-stated)       Current Barriers:  . Chronic Disease Management  support and education needs related to Chronic Kidney Disease  Nurse Case Manager Clinical Goal(s):  Marland Kitchen Over the next 60 days, patient will verbalize increased knowledge and understanding related to CKD, including how to Self management this condition to help reduce the risk for disease progression . Over the next 90 days, patient will not experience a decline in her renal function as evidence by patient will maintain or lower her serum BUN and Creatinine   CCM RN CM Interventions:  05/20/19 call completed with patient   . Evaluation of current treatment plan related to CKD and patient's adherence to plan as established by provider. . Advised patient to drink plenty of water to help the kidneys remove wastes from the blood in the form of urine; educated patient that water also helps keep your blood vessels open so  blood can travel more freely to the kidneys and deliver essential nutrients to the kidneys; discussed that severe dehydration can lead to kidney damage . Provided education to patient re: disease process and potential causes for CKD; educated patient on how to Self manage to help reduce the risk for disease progression; educated patient about her diagnosis of Stage III CKD and what this means in terms of disease progression; target goal for BP 130/80 or <    . Discussed plans with patient for ongoing care management follow up and provided patient with direct contact information for care management team . Advised patient, providing education and rationale, to monitor blood pressure daily and record, calling the CCM team and or PCP for findings outside established parameters.  . Provided patient with printed educational materials related to CKD, 6 Tips to being Water Wise for Healthy Kidneys; Life's Simple 7  Patient Self Care Activities:  . Self administers medications as prescribed . Attends all scheduled provider appointments . Calls pharmacy for medication refills . Performs ADL's independently . Performs IADL's independently . Calls provider office for new concerns or questions  Initial goal documentation       Other   . COMPLETED: Assist with Chronic Care Management and Care Coordination needs       Current Barriers:  Marland Kitchen Knowledge Barriers related to resources and support available to address needs related to Chronic Care Management and Care Coordination needs.   Case Manager Clinical Goal(s):  Marland Kitchen Over the next 30 days, patient will work with the CCM team to address needs related to Chronic Care Management and Care Coordination needs.   Interventions:  . See other patient centered goals  Patient Self Care Activities:  . Attends all scheduled provider appointments . Calls provider office for new concerns or questions  Initial goal documentation        Plan:   Telephone follow  up appointment with care management team member scheduled for: 05/28/19  Barb Merino, RN, BSN, CCM Care Management Coordinator [facility]  Direct Phone: [phone]

## 2019-05-21 NOTE — Patient Instructions (Addendum)
Visit Information  Goals Addressed      Patient Stated   . "I would like to get my neck pain under better control" (pt-stated)       Current Barriers:  . Chronic Disease Management support and education needs related to chronic neck and back pain  . Trochanteric bursitis of both hips . Spinal stenosis of cervical region . DDD (degenerative disc disease), cervical . Sjrogrens   Nurse Case Manager Clinical Goal(s):  Marland Kitchen Over the next 60 days, patient will work with the PCP and Rheumatologist to address needs related to chronic neck and back pain . Over the next 90 days, patient will experience no ED visit and or IP events secondary to uncontrolled or worsening neck/back pain  CCM RN CM Interventions:  05/20/19 call completed with patient   . Evaluation of current treatment plan related to Chronic Neck Pain  and patient's adherence to plan as established by provider. . Advised patient to keep Dr. Baird Cancer and or her Rheumatologist of new, worsening or severe and persistent pain that is not relieved with her current regimen; discussed this condition is being monitored and followed by Dr. Rosann Auerbach   . Provided education to patient re: benefits of working with a PT to help teach a safe and effective HEP to help with strengthening, improve ROM and help reduce pain/discomfort . Reviewed medications with patient and discussed patient is taking Plaquenil for Sjrogrens associated symptoms . Discussed plans with patient for ongoing care management follow up and provided patient with direct contact information for care management team  Patient Self Care Activities:  . Patient verbalizes understanding of plan to contact the CCM team, Dr. Baird Cancer or Rheumatology for severe, new or worsening persistent neck/back pain not relieved by current regimen  . Self administers medications as prescribed . Attends all scheduled provider appointments . Calls pharmacy for medication refills . Performs ADL's  independently . Performs IADL's independently . Calls provider office for new concerns or questions   Initial goal documentation     . "I would like to have less fatigue" (pt-stated)       Current Barriers:  Marland Kitchen Knowledge Deficits related to diagnosis and treatment of increased fatigue . Chronic Disease Management support and education needs related to fatigue  Nurse Case Manager Clinical Goal(s):  Marland Kitchen Over the next 60 days, patient will work with the CCM team and PCP to address needs related to evaluation and treatment of increased fatigue  CCM RN CM Interventions:  05/20/19 call completed with patient   . Evaluation of current treatment plan related to increased fatigue and patient's adherence to plan as established by provider. . Advised patient to keep the CCM RN and PCP well informed if this condition worsens; patient encouraged to implement daily exercise in her routine even if for 5-10 minutes per day and to balance her activity with plenty of rest; patient encouraged to perform her daily routines during the most energetic part of her day and to stay well hydrated, eat a well balanced meal and get plenty of sleep/rest . Provided education to patient re: the importance of drinking enough water to help prevent fatigue  . Reviewed medications with patient and discussed potential SE to some of the medications she is prescribed to take can cause and or worsen her fatigue . Collaborated with PCP, Dr. Baird Cancer via in basket message regarding patient's complaints of worsening fatigue and request for Dr. Baird Cancer to make recommendations regarding her abnormal TSH . Discussed plans with patient  for ongoing care management follow up and provided patient with direct contact information for care management team  Patient Self Care Activities:  . Self administers medications as prescribed . Attends all scheduled provider appointments . Calls pharmacy for medication refills . Performs ADL's  independently . Performs IADL's independently . Calls provider office for new concerns or questions  Initial goal documentation     . "I would like to know why I feel that my food is getting stuck in my esophagus" (pt-stated)       Current Barriers:  Marland Kitchen Knowledge Deficits related to diagnosis and treatment of impaired swallowing  Nurse Case Manager Clinical Goal(s):  Marland Kitchen Over the next 90 days, patient will verbalize understanding of plan for treatment of impaired swallowing  . Over the next 90 days, patient will work with the PCP and or GI Specialist to address needs related to Impaired Swallowing or esophageal narrowing   CCM RN CM Interventions:  05/20/19 call completed with patient   . Evaluation of current treatment plan related to Impaired Swallowing and patient's adherence to plan as established by provider. . Provided education to patient re: potential causes of Impaired swallowing and feeling as her pills or food are stuck in her esophagus including having developed a narrowed esophagus and or increased irritation related GERD; educated patient on diagnosis and treatment options for this condition if detected; patient denies having difficulty with swallowing liquids or food during meal times; she denies having prior issues with esophageal narrowing; she denies having choking or strangling episodes . Reviewed medications with patient and discussed patient is adhering to taking Omeprazole exactly as prescribed . Collaborated with PCP, Dr. [name] MD via in basket message regarding patients reported symptoms . Discussed plans with patient for ongoing care management follow up and provided patient with direct contact information for care management team  Patient Self Care Activities:  . Self administers medications as prescribed . Attends all scheduled provider appointments . Calls pharmacy for medication refills . Performs ADL's independently . Performs IADL's  independently . Calls provider office for new concerns or questions   Initial goal documentation     . "I would like to learn why I am having dizzy spells" (pt-stated)       Current Barriers:  Marland Kitchen Knowledge Deficits related to diagnosis and treatment of Vertigo  Nurse Case Manager Clinical Goal(s):  Marland Kitchen Over the next 60 days, patient will work with the CCM team and PCP to address needs related to evaluation and treatment of Vertigo . Over the next 90 days, patient will experience no falls secondary to Vertigo  CCM RN CM Interventions:  05/20/19 call completed with patient   . Evaluation of current treatment plan related to Vertigo and patient's adherence to plan as established by provider. . Advised patient to take her time when changing positions from sitting to standing to allow her BP time to adjust and to avoid bending over is possible . Provided education to patient re: benefits from Vestibular PT pending outcome of medication review with Pharm D . Reviewed medications with patient and discussed indication, dosage and frequency of her prescribed hypertensive medications; discussed indication, dosage and frequency of Trazodone prescribed for insomnia; discussed and reviewed that some antihypertensive medications can cause drowsiness or dizziness; verbal education provided related to potential "hang over" effect that can occur from the Trazodone; discussed patient's dizziness is more severe in the am but that she also experience's intermittent episodes of sudden dizziness w/o warning and at  random times of the day . Collaborated with embedded Pharm D Lottie Dawson and PCP via in basket message  regarding patient c/o vertigo and requested Pharmacist outreach to further evaluate patient's medication regimen for potential medication induced association . Discussed plans with patient for ongoing care management follow up and provided patient with direct contact information for care management  team  Patient Self Care Activities:  . Self administers medications as prescribed . Attends all scheduled provider appointments . Calls pharmacy for medication refills . Performs ADL's independently . Performs IADL's independently . Calls provider office for new concerns or questions  Initial goal documentation      . I would like to learn more about Chronic Kidney disease" (pt-stated)       Current Barriers:  . Chronic Disease Management support and education needs related to Chronic Kidney Disease  Nurse Case Manager Clinical Goal(s):  Marland Kitchen Over the next 60 days, patient will verbalize increased knowledge and understanding related to CKD, including how to Self management this condition to help reduce the risk for disease progression . Over the next 90 days, patient will not experience a decline in her renal function as evidence by patient will maintain or lower her serum BUN and Creatinine   CCM RN CM Interventions:  05/20/19 call completed with patient   . Evaluation of current treatment plan related to CKD and patient's adherence to plan as established by provider. . Advised patient to drink plenty of water to help the kidneys remove wastes from the blood in the form of urine; educated patient that water also helps keep your blood vessels open so blood can travel more freely to the kidneys and deliver essential nutrients to the kidneys; discussed that severe dehydration can lead to kidney damage . Provided education to patient re: disease process and potential causes for CKD; educated patient on how to Self manage to help reduce the risk for disease progression; educated patient about her diagnosis of Stage III CKD and what this means in terms of disease progression; target goal for BP 130/80 or <    . Discussed plans with patient for ongoing care management follow up and provided patient with direct contact information for care management team . Advised patient, providing education and  rationale, to monitor blood pressure daily and record, calling the CCM team and or PCP for findings outside established parameters.  . Provided patient with printed educational materials related to CKD, 6 Tips to being Water Wise for Healthy Kidneys; Life's Simple 7  Patient Self Care Activities:  . Self administers medications as prescribed . Attends all scheduled provider appointments . Calls pharmacy for medication refills . Performs ADL's independently . Performs IADL's independently . Calls provider office for new concerns or questions  Initial goal documentation       Other   . COMPLETED: Assist with Chronic Care Management and Care Coordination needs       Current Barriers:  Marland Kitchen Knowledge Barriers related to resources and support available to address needs related to Chronic Care Management and Care Coordination needs.   Case Manager Clinical Goal(s):  Marland Kitchen Over the next 30 days, patient will work with the CCM team to address needs related to Chronic Care Management and Care Coordination needs.   Interventions:  . See other patient centered goals  Patient Self Care Activities:  . Attends all scheduled provider appointments . Calls provider office for new concerns or questions  Initial goal documentation  The patient verbalized understanding of instructions provided today and declined a print copy of patient instruction materials.   Telephone follow up appointment with care management team member scheduled for: 05/28/19  Barb Merino, RN, BSN, CCM Care Management Coordinator [facility]  Direct Phone: [phone]

## 2019-05-22 ENCOUNTER — Ambulatory Visit (INDEPENDENT_AMBULATORY_CARE_PROVIDER_SITE_OTHER): Payer: Medicare Other | Admitting: Internal Medicine

## 2019-05-22 ENCOUNTER — Ambulatory Visit: Payer: Self-pay

## 2019-05-22 ENCOUNTER — Other Ambulatory Visit: Payer: Self-pay

## 2019-05-22 ENCOUNTER — Encounter: Payer: Self-pay | Admitting: Internal Medicine

## 2019-05-22 VITALS — Temp 98.2°F | Ht 63.2 in

## 2019-05-22 DIAGNOSIS — R2681 Unsteadiness on feet: Secondary | ICD-10-CM

## 2019-05-22 DIAGNOSIS — M3500 Sicca syndrome, unspecified: Secondary | ICD-10-CM

## 2019-05-22 DIAGNOSIS — H6121 Impacted cerumen, right ear: Secondary | ICD-10-CM | POA: Diagnosis not present

## 2019-05-22 DIAGNOSIS — R7989 Other specified abnormal findings of blood chemistry: Secondary | ICD-10-CM

## 2019-05-22 DIAGNOSIS — R42 Dizziness and giddiness: Secondary | ICD-10-CM | POA: Diagnosis not present

## 2019-05-22 DIAGNOSIS — N1831 Chronic kidney disease, stage 3a: Secondary | ICD-10-CM

## 2019-05-22 DIAGNOSIS — Z712 Person consulting for explanation of examination or test findings: Secondary | ICD-10-CM

## 2019-05-22 DIAGNOSIS — R5383 Other fatigue: Secondary | ICD-10-CM | POA: Diagnosis not present

## 2019-05-22 DIAGNOSIS — R413 Other amnesia: Secondary | ICD-10-CM

## 2019-05-22 DIAGNOSIS — K219 Gastro-esophageal reflux disease without esophagitis: Secondary | ICD-10-CM

## 2019-05-22 DIAGNOSIS — I129 Hypertensive chronic kidney disease with stage 1 through stage 4 chronic kidney disease, or unspecified chronic kidney disease: Secondary | ICD-10-CM

## 2019-05-22 DIAGNOSIS — L989 Disorder of the skin and subcutaneous tissue, unspecified: Secondary | ICD-10-CM

## 2019-05-22 NOTE — Chronic Care Management (AMB) (Signed)
Chronic Care Management   Follow Up Note   05/22/2019 Name: Amanda Ross MRN: [id number] DOB: October 09, 1945  Referred by: Amanda Chard, MD Reason for referral : Chronic Care Management (CCM RNCM Telephone Follow up )   Amanda Ross is a 73 y.o. year old female who is a primary care patient of Amanda Chard, MD. The CCM team was consulted for assistance with chronic disease management and care coordination needs.    Review of patient status, including review of consultants reports, relevant laboratory and other test results, and collaboration with appropriate care team members and the patient's provider was performed as part of comprehensive patient evaluation and provision of chronic care management services.    SDOH (Social Determinants of Health) screening performed today: None. See Care Plan for related entries.   Placed an outbound to Amanda Ross as a follow up regarding Amanda Ross response to her reported symptoms.   Outpatient Encounter Medications as of 05/22/2019  Medication Sig  . aspirin 81 MG tablet Take 81 mg by mouth daily.  . baclofen (LIORESAL) 10 MG tablet TAKE 1/2 TABLET BY MOUTH TWICE DAILY AS NEEDED  . Cholecalciferol (VITAMIN D) 1000 UNITS capsule Take 2,000 Units by mouth daily.   . diclofenac Sodium (VOLTAREN) 1 % GEL Apply 2 g to 4 g to affected area up to 4 times daily as needed.  . hydroxychloroquine (PLAQUENIL) 200 MG tablet Take 1 tablet (200 mg total) by mouth every morning.  Marland Kitchen ipratropium (ATROVENT) 0.03 % nasal spray Place 2 sprays into both nostrils every 12 (twelve) hours. (Patient taking differently: Place 2 sprays into both nostrils as needed. )  . losartan-hydrochlorothiazide (HYZAAR) 50-12.5 MG tablet Take 1 tablet by mouth daily.  . magnesium gluconate (MAGONATE) 500 MG tablet Take 500 mg by mouth daily.   . metoprolol tartrate (LOPRESSOR) 25 MG tablet TAKE 1/2 TABLET BY MOUTH TWICE DAILY  . mirabegron ER (MYRBETRIQ) 25 MG TB24 tablet Take  1 tablet (25 mg total) by mouth daily.  Marland Kitchen omeprazole (PRILOSEC) 20 MG capsule Take 1 capsule (20 mg total) by mouth daily.  Marland Kitchen oxybutynin (DITROPAN-XL) 10 MG 24 hr tablet TK 1 T PO QD  . traZODone (DESYREL) 50 MG tablet TAKE 1 TABLET BY MOUTH EVERY NIGHT AT BEDTIME   No facility-administered encounter medications on file as of 05/22/2019.      Goals Addressed      Patient Stated   . "I would like to know why I feel that my food is getting stuck in my esophagus" (pt-stated)       Current Barriers:  Marland Kitchen Knowledge Deficits related to diagnosis and treatment of impaired swallowing  Nurse Case Manager Clinical Goal(s):  Marland Kitchen Over the next 90 days, patient will verbalize understanding of plan for treatment of impaired swallowing  . Over the next 90 days, patient will work with the PCP and or GI Specialist to address needs related to Impaired Swallowing or esophageal narrowing   CCM RN CM Interventions:  05/22/19 call completed with patient   . Placed a CCM RN CM outbound follow up call to Amanda Ross  . Evaluation of current treatment plan related to Impaired Swallowing and patient's adherence to plan as established by provider. Amanda Ross with PCP, Dr. [name] MD via in basket message regarding patient's reported symptoms and Amanda Ross would like to schedule an OV with Amanda Ross for 4 PM today for further evaluation of her symptoms - patient was notified and is aware of  her appointment time with Amanda Ross for this afternoon at 4 PM for a face to face visit and is agreeable  . Discussed plans with patient for ongoing care management follow up and provided patient with direct contact information for care management team  Patient Self Care Activities:  . Self administers medications as prescribed . Attends all scheduled provider appointments . Calls pharmacy for medication refills . Performs ADL's independently . Performs IADL's independently . Calls provider office for new concerns  or questions   Please see past updates related to this goal by clicking on the "Past Updates" button in the selected goal          Telephone follow up appointment with care management team member scheduled for: 05/28/19   Barb Merino, RN, BSN, CCM Care Management Coordinator [facility]  Direct Phone: [phone]

## 2019-05-22 NOTE — Patient Instructions (Addendum)
Increase Omeprazole to 20mg  - TWO capsules daily   Food Choices for Gastroesophageal Reflux Disease, Adult When you have gastroesophageal reflux disease (GERD), the foods you eat and your eating habits are very important. Choosing the right foods can help ease your discomfort. Think about working with a nutrition specialist (dietitian) to help you make good choices. What are tips for following this plan?  Meals  Choose healthy foods that are low in fat, such as fruits, vegetables, whole grains, low-fat dairy products, and lean meat, fish, and poultry.  Eat small meals often instead of 3 large meals a day. Eat your meals slowly, and in a place where you are relaxed. Avoid bending over or lying down until 2-3 hours after eating.  Avoid eating meals 2-3 hours before bed.  Avoid drinking a lot of liquid with meals.  Cook foods using methods other than frying. Bake, grill, or broil food instead.  Avoid or limit: ? Chocolate. ? Peppermint or spearmint. ? Alcohol. ? Pepper. ? Black and decaffeinated coffee. ? Black and decaffeinated tea. ? Bubbly (carbonated) soft drinks. ? Caffeinated energy drinks and soft drinks.  Limit high-fat foods such as: ? Fatty meat or fried foods. ? Whole milk, cream, butter, or ice cream. ? Nuts and nut butters. ? Pastries, donuts, and sweets made with butter or shortening.  Avoid foods that cause symptoms. These foods may be different for everyone. Common foods that cause symptoms include: ? Tomatoes. ? Oranges, lemons, and limes. ? Peppers. ? Spicy food. ? Onions and garlic. ? Vinegar. Lifestyle  Maintain a healthy weight. Ask your doctor what weight is healthy for you. If you need to lose weight, work with your doctor to do so safely.  Exercise for at least 30 minutes for 5 or more days each week, or as told by your doctor.  Wear loose-fitting clothes.  Do not smoke. If you need help quitting, ask your doctor.  Sleep with the head of your  bed higher than your feet. Use a wedge under the mattress or blocks under the bed frame to raise the head of the bed. Summary  When you have gastroesophageal reflux disease (GERD), food and lifestyle choices are very important in easing your symptoms.  Eat small meals often instead of 3 large meals a day. Eat your meals slowly, and in a place where you are relaxed.  Limit high-fat foods such as fatty meat or fried foods.  Avoid bending over or lying down until 2-3 hours after eating.  Avoid peppermint and spearmint, caffeine, alcohol, and chocolate. This information is not intended to replace advice given to you by your health care provider. Make sure you discuss any questions you have with your health care provider. Document Released: 12/04/2011 Document Revised: 09/25/2018 Document Reviewed: 07/10/2016 Elsevier Patient Education  [place].   Gastroesophageal Reflux Disease, Adult Gastroesophageal reflux (GER) happens when acid from the stomach flows up into the tube that connects the mouth and the stomach (esophagus). Normally, food travels down the esophagus and stays in the stomach to be digested. However, when a person has GER, food and stomach acid sometimes move back up into the esophagus. If this becomes a more serious problem, the person may be diagnosed with a disease called gastroesophageal reflux disease (GERD). GERD occurs when the reflux:  Happens often.  Causes frequent or severe symptoms.  Causes problems such as damage to the esophagus. When stomach acid comes in contact with the esophagus, the acid may cause soreness (inflammation)  in the esophagus. Over time, GERD may create small holes (ulcers) in the lining of the esophagus. What are the causes? This condition is caused by a problem with the muscle between the esophagus and the stomach (lower esophageal sphincter, or LES). Normally, the LES muscle closes after food passes through the esophagus to the stomach.  When the LES is weakened or abnormal, it does not close properly, and that allows food and stomach acid to go back up into the esophagus. The LES can be weakened by certain dietary substances, medicines, and medical conditions, including:  Tobacco use.  Pregnancy.  Having a hiatal hernia.  Alcohol use.  Certain foods and beverages, such as coffee, chocolate, onions, and peppermint. What increases the risk? You are more likely to develop this condition if you:  Have an increased body weight.  Have a connective tissue disorder.  Use NSAID medicines. What are the signs or symptoms? Symptoms of this condition include:  Heartburn.  Difficult or painful swallowing.  The feeling of having a lump in the throat.  Abitter taste in the mouth.  Bad breath.  Having a large amount of saliva.  Having an upset or bloated stomach.  Belching.  Chest pain. Different conditions can cause chest pain. Make sure you see your health care provider if you experience chest pain.  Shortness of breath or wheezing.  Ongoing (chronic) cough or a night-time cough.  Wearing away of tooth enamel.  Weight loss. How is this diagnosed? Your health care provider will take a medical history and perform a physical exam. To determine if you have mild or severe GERD, your health care provider may also monitor how you respond to treatment. You may also have tests, including:  A test to examine your stomach and esophagus with a small camera (endoscopy).  A test thatmeasures the acidity level in your esophagus.  A test thatmeasures how much pressure is on your esophagus.  A barium swallow or modified barium swallow test to show the shape, size, and functioning of your esophagus. How is this treated? The goal of treatment is to help relieve your symptoms and to prevent complications. Treatment for this condition may vary depending on how severe your symptoms are. Your health care provider may  recommend:  Changes to your diet.  Medicine.  Surgery. Follow these instructions at home: Eating and drinking   Follow a diet as recommended by your health care provider. This may involve avoiding foods and drinks such as: ? Coffee and tea (with or without caffeine). ? Drinks that containalcohol. ? Energy drinks and sports drinks. ? Carbonated drinks or sodas. ? Chocolate and cocoa. ? Peppermint and mint flavorings. ? Garlic and onions. ? Horseradish. ? Spicy and acidic foods, including peppers, chili powder, curry powder, vinegar, hot sauces, and barbecue sauce. ? Citrus fruit juices and citrus fruits, such as oranges, lemons, and limes. ? Tomato-based foods, such as red sauce, chili, salsa, and pizza with red sauce. ? Fried and fatty foods, such as donuts, french fries, potato chips, and high-fat dressings. ? High-fat meats, such as hot dogs and fatty cuts of red and white meats, such as rib eye steak, sausage, ham, and bacon. ? High-fat dairy items, such as whole milk, butter, and cream cheese.  Eat small, frequent meals instead of large meals.  Avoid drinking large amounts of liquid with your meals.  Avoid eating meals during the 2-3 hours before bedtime.  Avoid lying down right after you eat.  Do not exercise right  after you eat. Lifestyle   Do not use any products that contain nicotine or tobacco, such as cigarettes, e-cigarettes, and chewing tobacco. If you need help quitting, ask your health care provider.  Try to reduce your stress by using methods such as yoga or meditation. If you need help reducing stress, ask your health care provider.  If you are overweight, reduce your weight to an amount that is healthy for you. Ask your health care provider for guidance about a safe weight loss goal. General instructions  Pay attention to any changes in your symptoms.  Take over-the-counter and prescription medicines only as told by your health care provider. Do not  take aspirin, ibuprofen, or other NSAIDs unless your health care provider told you to do so.  Wear loose-fitting clothing. Do not wear anything tight around your waist that causes pressure on your abdomen.  Raise (elevate) the head of your bed about 6 inches (15 cm).  Avoid bending over if this makes your symptoms worse.  Keep all follow-up visits as told by your health care provider. This is important. Contact a health care provider if:  You have: ? New symptoms. ? Unexplained weight loss. ? Difficulty swallowing or it hurts to swallow. ? Wheezing or a persistent cough. ? A hoarse voice.  Your symptoms do not improve with treatment. Get help right away if you:  Have pain in your arms, neck, jaw, teeth, or back.  Feel sweaty, dizzy, or light-headed.  Have chest pain or shortness of breath.  Vomit and your vomit looks like blood or coffee grounds.  Faint.  Have stool that is bloody or black.  Cannot swallow, drink, or eat. Summary  Gastroesophageal reflux happens when acid from the stomach flows up into the esophagus. GERD is a disease in which the reflux happens often, causes frequent or severe symptoms, or causes problems such as damage to the esophagus.  Treatment for this condition may vary depending on how severe your symptoms are. Your health care provider may recommend diet and lifestyle changes, medicine, or surgery.  Contact a health care provider if you have new or worsening symptoms.  Take over-the-counter and prescription medicines only as told by your health care provider. Do not take aspirin, ibuprofen, or other NSAIDs unless your health care provider told you to do so.  Keep all follow-up visits as told by your health care provider. This is important. This information is not intended to replace advice given to you by your health care provider. Make sure you discuss any questions you have with your health care provider. Document Released: 03/14/2005 Document  Revised: 12/11/2017 Document Reviewed: 12/11/2017 Elsevier Patient Education  [place].  Duloxetine delayed-release capsules What is this medicine? DULOXETINE (doo LOX e teen) is used to treat depression, anxiety, and different types of chronic pain. This medicine may be used for other purposes; ask your health care provider or pharmacist if you have questions. COMMON BRAND NAME(S): Cymbalta, Creig Hines, Irenka What should I tell my health care provider before I take this medicine? They need to know if you have any of these conditions:  bipolar disorder  glaucoma  high blood pressure  kidney disease  liver disease  seizures  suicidal thoughts, plans or attempt; a previous suicide attempt by you or a family member  take medicines that treat or prevent blood clots  taken medicines called MAOIs like Carbex, Eldepryl, Marplan, Nardil, and Parnate within 14 days  trouble passing urine  an unusual reaction to duloxetine,  other medicines, foods, dyes, or preservatives  pregnant or trying to get pregnant  breast-feeding How should I use this medicine? Take this medicine by mouth with a glass of water. Follow the directions on the prescription label. Do not crush, cut or chew some capsules of this medicine. Some capsules may be opened and sprinkled on applesauce. Check with your doctor or pharmacist if you are not sure. You can take this medicine with or without food. Take your medicine at regular intervals. Do not take your medicine more often than directed. Do not stop taking this medicine suddenly except upon the advice of your doctor. Stopping this medicine too quickly may cause serious side effects or your condition may worsen. A special MedGuide will be given to you by the pharmacist with each prescription and refill. Be sure to read this information carefully each time. Talk to your pediatrician regarding the use of this medicine in children. While this drug may be  prescribed for children as young as 43 years of age for selected conditions, precautions do apply. Overdosage: If you think you have taken too much of this medicine contact a poison control center or emergency room at once. NOTE: This medicine is only for you. Do not share this medicine with others. What if I miss a dose? If you miss a dose, take it as soon as you can. If it is almost time for your next dose, take only that dose. Do not take double or extra doses. What may interact with this medicine? Do not take this medicine with any of the following medications:  desvenlafaxine  levomilnacipran  linezolid  MAOIs like Carbex, Eldepryl, Marplan, Nardil, and Parnate  methylene blue (injected into a vein)  milnacipran  thioridazine  venlafaxine This medicine may also interact with the following medications:  alcohol  amphetamines  aspirin and aspirin-like medicines  certain antibiotics like ciprofloxacin and enoxacin  certain medicines for blood pressure, heart disease, irregular heart beat  certain medicines for depression, anxiety, or psychotic disturbances  certain medicines for migraine headache like almotriptan, eletriptan, frovatriptan, naratriptan, rizatriptan, sumatriptan, zolmitriptan  certain medicines that treat or prevent blood clots like warfarin, enoxaparin, and dalteparin  cimetidine  fentanyl  lithium  NSAIDS, medicines for pain and inflammation, like ibuprofen or naproxen  phentermine  procarbazine  rasagiline  sibutramine  St. John's wort  theophylline  tramadol  tryptophan This list may not describe all possible interactions. Give your health care provider a list of all the medicines, herbs, non-prescription drugs, or dietary supplements you use. Also tell them if you smoke, drink alcohol, or use illegal drugs. Some items may interact with your medicine. What should I watch for while using this medicine? Tell your doctor if your  symptoms do not get better or if they get worse. Visit your doctor or healthcare provider for regular checks on your progress. Because it may take several weeks to see the full effects of this medicine, it is important to continue your treatment as prescribed by your doctor. This medicine may cause serious skin reactions. They can happen weeks to months after starting the medicine. Contact your healthcare provider right away if you notice fevers or flu-like symptoms with a rash. The rash may be red or purple and then turn into blisters or peeling of the skin. Or, you might notice a red rash with swelling of the face, lips, or lymph nodes in your neck or under your arms. Patients and their families should watch out for new or  worsening thoughts of suicide or depression. Also watch out for sudden changes in feelings such as feeling anxious, agitated, panicky, irritable, hostile, aggressive, impulsive, severely restless, overly excited and hyperactive, or not being able to sleep. If this happens, especially at the beginning of treatment or after a change in dose, call your healthcare provider. You may get drowsy or dizzy. Do not drive, use machinery, or do anything that needs mental alertness until you know how this medicine affects you. Do not stand or sit up quickly, especially if you are an older patient. This reduces the risk of dizzy or fainting spells. Alcohol may interfere with the effect of this medicine. Avoid alcoholic drinks. This medicine can cause an increase in blood pressure. This medicine can also cause a sudden drop in your blood pressure, which may make you feel faint and increase the chance of a fall. These effects are most common when you first start the medicine or when the dose is increased, or during use of other medicines that can cause a sudden drop in blood pressure. Check with your doctor for instructions on monitoring your blood pressure while taking this medicine. Your mouth may get  dry. Chewing sugarless gum or sucking hard candy, and drinking plenty of water, may help. Contact your doctor if the problem does not go away or is severe. What side effects may I notice from receiving this medicine? Side effects that you should report to your doctor or health care professional as soon as possible:  allergic reactions like skin rash, itching or hives, swelling of the face, lips, or tongue  anxious  breathing problems  confusion  changes in vision  chest pain  confusion  elevated mood, decreased need for sleep, racing thoughts, impulsive behavior  eye pain  fast, irregular heartbeat  feeling faint or lightheaded, falls  feeling agitated, angry, or irritable  hallucination, loss of contact with reality  high blood pressure  loss of balance or coordination  palpitations  redness, blistering, peeling or loosening of the skin, including inside the mouth  restlessness, pacing, inability to keep still  seizures  stiff muscles  suicidal thoughts or other mood changes  trouble passing urine or change in the amount of urine  trouble sleeping  unusual bleeding or bruising  unusually weak or tired  vomiting  yellowing of the eyes or skin Side effects that usually do not require medical attention (report to your doctor or health care professional if they continue or are bothersome):  change in sex drive or performance  change in appetite or weight  constipation  dizziness  dry mouth  headache  increased sweating  nausea  tired This list may not describe all possible side effects. Call your doctor for medical advice about side effects. You may report side effects to FDA at 1-800-FDA-1088. Where should I keep my medicine? Keep out of the reach of children. Store at room temperature between 15 and 30 degrees C (59 to 86 degrees F). Throw away any unused medicine after the expiration date. NOTE: This sheet is a summary. It may not cover  all possible information. If you have questions about this medicine, talk to your doctor, pharmacist, or health care provider.  2020 Elsevier/Gold Standard (2018-09-04 13:47:50)

## 2019-05-22 NOTE — Progress Notes (Signed)
  Chronic Care Management   Outreach Note  05/22/2019 Name: Amanda Ross MRN: [id number] DOB: [birth year]  Referred by: [name], MD Reason for referral : Chronic Care Management   An unsuccessful telephone outreach was attempted today. The patient was referred to the case management team by for assistance with care management and care coordination.   Follow Up Plan: A HIPPA compliant phone message was left for the patient providing contact information and requesting a return call.  The care management team will reach out to the patient again over the next 7-10 days.   SIGNATURE Regina Eck, PharmD, BCPS Clinical Pharmacist, [facility] [place]: [phone]

## 2019-05-23 LAB — THYROID PEROXIDASE ANTIBODY: Thyroperoxidase Ab SerPl-aCnc: <9 IU/mL (ref 0–34)

## 2019-05-25 ENCOUNTER — Other Ambulatory Visit: Payer: Self-pay | Admitting: Nurse Practitioner

## 2019-05-25 DIAGNOSIS — N644 Mastodynia: Secondary | ICD-10-CM

## 2019-05-26 ENCOUNTER — Ambulatory Visit: Payer: Medicare Other

## 2019-05-26 ENCOUNTER — Telehealth: Payer: Self-pay

## 2019-05-26 DIAGNOSIS — N1831 Chronic kidney disease, stage 3a: Secondary | ICD-10-CM

## 2019-05-26 NOTE — Patient Instructions (Signed)
Social Worker Visit Information  Goals we discussed today:  Goals Addressed            This Visit's Progress     Patient Stated   . "I need dental work but can't afford it" (pt-stated)   Not on track    Current Barriers:  . Financial constraints related to the cost of dentures . COVID 19 Pandemic . Lack of dental coverage  Social Work Clinical Goal(s):  Marland Kitchen Over the next 45 days the patient will work with SW to identify dental resources available to the patient  CCM SW Interventions: Completed 05/26/2019 . Outbound call to the patient to communicate lack of ability to locate dental clinics at this time . Reviewed importance of improving oral health due to impact on kidney disease . Discussed placing referral to [place] 2-1-1 to request assistance in locating dental resources for the patient . Placed referral to [place] 2-1-1 navigator via [place] . Scheduled follow up call to the patient over the next 6 weeks to determine outcome of referral  Patient Self Care Activities:  . Self administers medications as prescribed . Calls pharmacy for medication refills . Performs ADL's independently . Calls provider office for new concerns or questions  Please see past updates related to this goal by clicking on the "Past Updates" button in the selected goal         Follow Up Plan: SW will follow up with patient by phone over the next 6 weeks.   Daneen Schick, BSW, CDP Social Worker, Certified Dementia Practitioner [place] [facility] [place] [facility] [phone]

## 2019-05-26 NOTE — Chronic Care Management (AMB) (Signed)
Chronic Care Management   Social Work Follow Up Note  05/26/2019 Name: Amanda Ross MRN: [id number] DOB: 20-Nov-1945  Amanda Ross is a 73 y.o. year old female who is a primary care patient of [name], MD. The CCM team was consulted for assistance with care coordination.   Review of patient status, including review of consultants reports, other relevant assessments, and collaboration with appropriate care team members and the patient's provider was performed as part of comprehensive patient evaluation and provision of chronic care management services.    SW placed an outbound call to the patient to to continue assisting with care coordination needs.  Outpatient Encounter Medications as of 05/26/2019  Medication Sig  . aspirin 81 MG tablet Take 81 mg by mouth daily.  . baclofen (LIORESAL) 10 MG tablet TAKE 1/2 TABLET BY MOUTH TWICE DAILY AS NEEDED  . Cholecalciferol (VITAMIN D) 1000 UNITS capsule Take 2,000 Units by mouth daily.   . diclofenac Sodium (VOLTAREN) 1 % GEL Apply 2 g to 4 g to affected area up to 4 times daily as needed.  . hydroxychloroquine (PLAQUENIL) 200 MG tablet Take 1 tablet (200 mg total) by mouth every morning.  Marland Kitchen ipratropium (ATROVENT) 0.03 % nasal spray Place 2 sprays into both nostrils every 12 (twelve) hours. (Patient taking differently: Place 2 sprays into both nostrils as needed. )  . losartan-hydrochlorothiazide (HYZAAR) 50-12.5 MG tablet Take 1 tablet by mouth daily.  . magnesium gluconate (MAGONATE) 500 MG tablet Take 500 mg by mouth daily.   . metoprolol tartrate (LOPRESSOR) 25 MG tablet TAKE 1/2 TABLET BY MOUTH TWICE DAILY  . mirabegron ER (MYRBETRIQ) 25 MG TB24 tablet Take 1 tablet (25 mg total) by mouth daily.  Marland Kitchen omeprazole (PRILOSEC) 20 MG capsule Take 1 capsule (20 mg total) by mouth daily.  Marland Kitchen oxybutynin (DITROPAN-XL) 10 MG 24 hr tablet TK 1 T PO QD  . traZODone (DESYREL) 50 MG tablet TAKE 1 TABLET BY MOUTH EVERY NIGHT AT BEDTIME   No  facility-administered encounter medications on file as of 05/26/2019.      Goals Addressed            This Visit's Progress     Patient Stated   . "I need dental work but can't afford it" (pt-stated)   Not on track    Current Barriers:  . Financial constraints related to the cost of dentures . COVID 19 Pandemic . Lack of dental coverage  Social Work Clinical Goal(s):  Marland Kitchen Over the next 45 days the patient will work with SW to identify dental resources available to the patient  CCM SW Interventions: Completed 05/26/2019 . Outbound call to the patient to communicate lack of ability to locate dental clinics at this time . Reviewed importance of improving oral health due to impact on kidney disease . Discussed placing referral to [place] 2-1-1 to request assistance in locating dental resources for the patient . Placed referral to [place] 2-1-1 navigator via [place] . Scheduled follow up call to the patient over the next 6 weeks to determine outcome of referral  Patient Self Care Activities:  . Self administers medications as prescribed . Calls pharmacy for medication refills . Performs ADL's independently . Calls provider office for new concerns or questions  Please see past updates related to this goal by clicking on the "Past Updates" button in the selected goal         Follow Up Plan: SW will follow up with patient by phone over the  next 6 weeks.   Daneen Schick, BSW, CDP Social Worker, Certified Dementia Practitioner [place] [facility] [place] [facility] [phone]  Total time spent performing care coordination and/or care management activities with the patient by phone or face to face = 10 minutes.

## 2019-05-28 ENCOUNTER — Telehealth: Payer: Self-pay

## 2019-05-28 ENCOUNTER — Ambulatory Visit: Payer: Self-pay | Admitting: Pharmacist

## 2019-05-28 DIAGNOSIS — N1831 Chronic kidney disease, stage 3a: Secondary | ICD-10-CM

## 2019-05-28 NOTE — Progress Notes (Signed)
  Chronic Care Management   Outreach Note  05/28/2019 Name: Amanda Ross MRN: [id number] DOB: March 17, 1946  Referred by: [name], MD Reason for referral : Chronic Care Management   An unsuccessful telephone outreach was attempted today. The patient was referred to the case management team by for assistance with care management and care coordination.   Follow Up Plan: A HIPPA compliant phone message was left for the patient providing contact information and requesting a return call.  The care management team will reach out to the patient again over the next 7 days.   SIGNATURE Regina Eck, PharmD, BCPS Clinical Pharmacist, [facility] [place]: [phone]

## 2019-06-04 ENCOUNTER — Ambulatory Visit
Admission: RE | Admit: 2019-06-04 | Discharge: 2019-06-04 | Disposition: A | Discharge status: Home / Self Care | Payer: Medicare Other | Source: Ambulatory Visit | Attending: Nurse Practitioner | Admitting: Nurse Practitioner

## 2019-06-04 ENCOUNTER — Other Ambulatory Visit: Payer: Self-pay

## 2019-06-04 DIAGNOSIS — N644 Mastodynia: Secondary | ICD-10-CM

## 2019-06-05 ENCOUNTER — Telehealth: Payer: Self-pay

## 2019-06-07 NOTE — Progress Notes (Signed)
This visit occurred during the SARS-CoV-2 public health emergency.  Safety protocols were in place, including screening questions prior to the visit, additional usage of staff PPE, and extensive cleaning of exam room while observing appropriate contact time as indicated for disinfecting solutions.  Subjective:     Patient ID: Amanda Ross , female    DOB: [birth year] , 73 y.o.   MRN: [id number]   Chief Complaint  Patient presents with  . Dizziness    HPI  Dizziness This is a new problem. The current episode started in the past 7 days. The problem occurs intermittently. The problem has been unchanged. Associated symptoms include fatigue. Pertinent negatives include no urinary symptoms, visual change or vomiting. The symptoms are aggravated by standing. She has tried nothing for the symptoms.     Past Medical History:  Diagnosis Date  . Asthma   . GERD (gastroesophageal reflux disease)   . H/O bladder infections   . H/O measles   . H/O varicella   . Headache(784.0)    Frequently  . Hypertension   . OSA (obstructive sleep apnea)   . Osteoarthritis   . Yeast infection      Family History  Problem Relation Age of Onset  . Asthma Daughter   . Heart disease Mother        CHF  . Deep vein thrombosis Mother   . Rheum arthritis Mother   . Heart disease Brother   . Rheum arthritis Sister      Current Outpatient Medications:  .  aspirin 81 MG tablet, Take 81 mg by mouth daily., Disp: , Rfl:  .  baclofen (LIORESAL) 10 MG tablet, TAKE 1/2 TABLET BY MOUTH TWICE DAILY AS NEEDED, Disp: 30 tablet, Rfl: 0 .  Cholecalciferol (VITAMIN D) 1000 UNITS capsule, Take 2,000 Units by mouth daily. , Disp: , Rfl:  .  diclofenac Sodium (VOLTAREN) 1 % GEL, Apply 2 g to 4 g to affected area up to 4 times daily as needed., Disp: 400 g, Rfl: 0 .  hydroxychloroquine (PLAQUENIL) 200 MG tablet, Take 1 tablet (200 mg total) by mouth every morning., Disp: 90 tablet, Rfl: 0 .  ipratropium (ATROVENT)  0.03 % nasal spray, Place 2 sprays into both nostrils every 12 (twelve) hours. (Patient taking differently: Place 2 sprays into both nostrils as needed. ), Disp: 30 mL, Rfl: 12 .  losartan-hydrochlorothiazide (HYZAAR) 50-12.5 MG tablet, Take 1 tablet by mouth daily., Disp: 90 tablet, Rfl: 1 .  magnesium gluconate (MAGONATE) 500 MG tablet, Take 500 mg by mouth daily. , Disp: , Rfl:  .  metoprolol tartrate (LOPRESSOR) 25 MG tablet, TAKE 1/2 TABLET BY MOUTH TWICE DAILY, Disp: 90 tablet, Rfl: 1 .  mirabegron ER (MYRBETRIQ) 25 MG TB24 tablet, Take 1 tablet (25 mg total) by mouth daily., Disp: 1 tablet, Rfl: 0 .  omeprazole (PRILOSEC) 20 MG capsule, Take 1 capsule (20 mg total) by mouth daily., Disp: 90 capsule, Rfl: 1 .  oxybutynin (DITROPAN-XL) 10 MG 24 hr tablet, TK 1 T PO QD, Disp: , Rfl:  .  traZODone (DESYREL) 50 MG tablet, TAKE 1 TABLET BY MOUTH EVERY NIGHT AT BEDTIME, Disp: 90 tablet, Rfl: 0   Allergies  Allergen Reactions  . Ivp Dye [Iodinated Diagnostic Agents] Hives  . Iohexol   . Penicillins Hives  . Percocet [Oxycodone-Acetaminophen]   . Prednisone Nausea Only     Review of Systems  Constitutional: Positive for fatigue.  Respiratory: Negative.   Cardiovascular: Negative.   Gastrointestinal: Negative.  Negative for vomiting.  Neurological: Positive for dizziness.  Psychiatric/Behavioral: Negative.      Today's Vitals   05/22/19 1616  Temp: 98.2 F (36.8 C)  TempSrc: Oral  Height: 5' 3.2" (1.605 m)  PainSc: 0-No pain   Body mass index is 39.18 kg/m.   Objective:  Physical Exam Vitals and nursing note reviewed.  Constitutional:      Appearance: Normal appearance. She is obese.  HENT:     Head: Normocephalic and atraumatic.     Right Ear: Ear canal and external ear normal. There is impacted cerumen.     Left Ear: Tympanic membrane, ear canal and external ear normal.  Cardiovascular:     Rate and Rhythm: Normal rate and regular rhythm.     Heart sounds: Normal heart  sounds.  Pulmonary:     Effort: Pulmonary effort is normal.     Breath sounds: Normal breath sounds.  Skin:    General: Skin is warm.     Comments: Hyperpigmented lesion on left cheek.   Neurological:     General: No focal deficit present.     Mental Status: She is alert.  Psychiatric:        Mood and Affect: Mood normal.        Behavior: Behavior normal.         Assessment And Plan:     1. Dizziness  Orthostatics performed, this is negative. Pt advised that her sx could have been exacerbated by cerumen impaction. She will let me know if her sx persist. She is encouraged to stay well hydrated.   2. Right ear impacted cerumen  AFTER OBTAINING VERBAL CONSENT, RIGHT EAR WAS FLUSHED BY IRRIGATION. SHE TOLERATED PROCEDURE WELL WITHOUT ANY COMPLICATIONS. NO TM ABNORMALITIES WERE NOTED.  - Ear Lavage  3. Other fatigue  Chronic. She is encouraged to stay well hydrated and to increase her daily activity.   4. Abnormal thyroid blood test  Previous thyroid labs reviewed. I will check labs as listed below. I will make further recommendations once her labs are available for review.   - Thyroid Peroxidase Antibody  5. Gastroesophageal reflux disease without esophagitis  Chronic. Advised to increase her omeprazole 20mg  to TWO tabs daily. She will rto in four to six weeks for re-evaluation.   6. Unsteady gait when walking  She agrees to [facility] evaluation.   - Ambulatory referral to [place]  7. Skin lesion  I will refer her to Derm for further evaluation.   - Ambulatory referral to Dermatology     Maximino Greenland, MD    THE PATIENT IS ENCOURAGED TO PRACTICE SOCIAL DISTANCING DUE TO THE COVID-19 PANDEMIC.

## 2019-06-08 ENCOUNTER — Ambulatory Visit: Payer: Self-pay | Admitting: Pharmacist

## 2019-06-08 ENCOUNTER — Telehealth: Payer: Self-pay

## 2019-06-08 DIAGNOSIS — N1831 Chronic kidney disease, stage 3a: Secondary | ICD-10-CM

## 2019-06-08 DIAGNOSIS — I10 Essential (primary) hypertension: Secondary | ICD-10-CM

## 2019-06-08 DIAGNOSIS — M3509 Sicca syndrome with other organ involvement: Secondary | ICD-10-CM

## 2019-06-08 MED ORDER — HYDROXYCHLOROQUINE SULFATE 200 MG PO TABS: 200.0000 mg | ORAL_TABLET | Freq: Every morning | ORAL | 0 refills | Status: DC

## 2019-06-08 NOTE — Telephone Encounter (Signed)
Refill request received via fax from [facility] on [address] for [employer].   Last Visit: 05/18/2019 telemedicine  Next Visit: 10/21/2019 Labs: CBC and CMP were drawn on 12/15/18.  BMP updated on 03/24/19.  Eye exam: 01/05/19  Attempted to contact patient and left message on machine to advise patient she is due to update labs (at a [facility] or [facility]). Advised patient to call with location preference so orders can be released.   Okay to refill 30 day supply, per Dr. Estanislado Pandy.

## 2019-06-10 ENCOUNTER — Ambulatory Visit: Payer: Medicare Other | Admitting: Pharmacist

## 2019-06-10 DIAGNOSIS — I10 Essential (primary) hypertension: Secondary | ICD-10-CM

## 2019-06-10 DIAGNOSIS — N1831 Chronic kidney disease, stage 3a: Secondary | ICD-10-CM

## 2019-06-10 NOTE — Progress Notes (Signed)
Chronic Care Management   Initial Visit Note  06/08/2019 Name: Amanda Ross MRN: [id number] DOB: [birth year]  Referred by: [name], MD Reason for referral : Chronic Care Management   Amanda Ross is a 73 y.o. year old female who is a primary care patient of [name], MD. 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    I spoke with Amanda Ross by telephone today.  Medications: Outpatient Encounter Medications as of 06/08/2019  Medication Sig  . aspirin 81 MG tablet Take 81 mg by mouth daily.  . baclofen (LIORESAL) 10 MG tablet TAKE 1/2 TABLET BY MOUTH TWICE DAILY AS NEEDED  . Cholecalciferol (VITAMIN D) 1000 UNITS capsule Take 2,000 Units by mouth daily.   . diclofenac Sodium (VOLTAREN) 1 % GEL Apply 2 g to 4 g to affected area up to 4 times daily as needed.  Marland Kitchen ipratropium (ATROVENT) 0.03 % nasal spray Place 2 sprays into both nostrils every 12 (twelve) hours. (Patient taking differently: Place 2 sprays into both nostrils as needed. )  . losartan-hydrochlorothiazide (HYZAAR) 50-12.5 MG tablet Take 1 tablet by mouth daily.  . magnesium gluconate (MAGONATE) 500 MG tablet Take 500 mg by mouth daily.   . metoprolol tartrate (LOPRESSOR) 25 MG tablet TAKE 1/2 TABLET BY MOUTH TWICE DAILY  . mirabegron ER (MYRBETRIQ) 25 MG TB24 tablet Take 1 tablet (25 mg total) by mouth daily.  Marland Kitchen omeprazole (PRILOSEC) 20 MG capsule Take 1 capsule (20 mg total) by mouth daily. (Patient taking differently: Take 40 mg by mouth daily. )  . oxybutynin (DITROPAN-XL) 10 MG 24 hr tablet TK 1 T PO QD  . traZODone (DESYREL)  50 MG tablet TAKE 1 TABLET BY MOUTH EVERY NIGHT AT BEDTIME  . [DISCONTINUED] hydroxychloroquine (PLAQUENIL) 200 MG tablet Take 1 tablet (200 mg total) by mouth every morning.   No facility-administered encounter medications on file as of 06/08/2019.     Objective:   Goals Addressed            This Visit's Progress     Patient Stated   . I would like to manage my chronic conditions (pt-stated)       Current Barriers:  . Polypharmacy; complex patient with multiple comorbidities including asthma, CKD, HTN, hyperparathyroidism, urinary incontinence  . Self-manages medications -- Does not use a pill box or other adherence strategies . New onset dizziness-per MD visit on 05/22/19 "This is a new problem. The current episode started in the past 7 days. The problem occurs intermittently. The problem has been unchanged. Associated symptoms include fatigue. Pertinent negatives include no urinary symptoms, visual change or vomiting. The symptoms are aggravated by standing. She has tried nothing for the symptoms." . Patient admits to being dehydrated, low intake of fluids . Recalls dizziness started near the time of oxybutynin initiation (late summer 2020)  Pharmacist Clinical Goal(s):  Marland Kitchen Over the next 90 days, patient will work with PharmD and provider towards optimized medication management of chronic conditions  Interventions: Call completed with patient on 06/08/19 . Comprehensive medication review performed; medication list updated in electronic medical record . Reviewed medications that could potentially cause dizziness.  Oxybutynin was started late summer  and patient states this may be what is causing this side effect.  Patient has remained stable on all other medications.  BP within normal limits.  Patient wishes to hold oxybutynin to see if dizziness resolves.  Encouraged patient to reach out to Urology for future guidance before starting/stopping medication prescribed by  urologist. . Counseled patient to increase fluid intake to prevent dehydration . Counseled patient to avoid and sudden movements.  She must take her time standing/sitting/etc . Continue to take BP regularly at home o Recent BP/orthostatics at PCP office visit:  o   o Management of contributing risk factors, such as diabetes and hypertension, is critical to slowing progression of CKD.  Patient remains on ARB for CKD/HTN.  She reports compliance.  BP controlled/at goal as mentioned above.  o Medications dosed appropriately per renal function. Current CrCl ~3mL/min  Patient Self Care Activities:  . Patient will take medications as prescribed . Patient will focus on improved adherence   Initial goal documentation        Plan:   The care management team will reach out to the patient again over the next 30 days.   Provider Signature Regina Eck, PharmD, BCPS Clinical Pharmacist, [facility] [place]: [phone]

## 2019-06-15 NOTE — Patient Instructions (Signed)
Visit Information  Goals Addressed            This Visit's Progress     Patient Stated   . I would like to manage my chronic conditions (pt-stated)       Current Barriers:  . Polypharmacy; complex patient with multiple comorbidities including asthma, CKD, HTN, hyperparathyroidism, urinary incontinence  . Self-manages medications -- Does not use a pill box or other adherence strategies . New onset dizziness-per MD visit on 05/22/19 "This is a new problem. The current episode started in the past 7 days. The problem occurs intermittently. The problem has been unchanged. Associated symptoms include fatigue. Pertinent negatives include no urinary symptoms, visual change or vomiting. The symptoms are aggravated by standing. She has tried nothing for the symptoms." . Patient admits to being dehydrated, low intake of fluids . Recalls dizziness started near the time of oxybutynin initiation (late summer 2020)  Pharmacist Clinical Goal(s):  Marland Kitchen Over the next 90 days, patient will work with PharmD and provider towards optimized medication management of chronic conditions  Interventions: Call completed with patient on 06/08/19 . Comprehensive medication review performed; medication list updated in electronic medical record . Reviewed medications that could potentially cause dizziness.  Oxybutynin was started late summer and patient states this may be what is causing this side effect.  Patient has remained stable on all other medications.  BP within normal limits.  Patient wishes to hold oxybutynin to see if dizziness resolves.  Encouraged patient to reach out to Urology for future guidance before starting/stopping medication prescribed by urologist. . Counseled patient to increase fluid intake to prevent dehydration . Counseled patient to avoid and sudden movements.  She must take her time standing/sitting/etc . Continue to take BP regularly at home o Recent BP/orthostatics at PCP office visit:  o    o Management of contributing risk factors, such as diabetes and hypertension, is critical to slowing progression of CKD.  Patient remains on ARB for CKD/HTN.  She reports compliance.  BP controlled/at goal as mentioned above.  o Medications dosed appropriately per renal function. Current CrCl ~45mL/min  Patient Self Care Activities:  . Patient will take medications as prescribed . Patient will focus on improved adherence   Initial goal documentation        The patient verbalized understanding of instructions provided today and declined a print copy of patient instruction materials.   The care management team will reach out to the patient again over the next 30 days.   SIGNATURE Regina Eck, PharmD, BCPS Clinical Pharmacist, [facility] [place]: [phone]

## 2019-06-15 NOTE — Patient Instructions (Signed)
Visit Information  Goals Addressed            This Visit's Progress     Patient Stated   . I would like to manage my chronic conditions (pt-stated)       Current Barriers:  . Polypharmacy; complex patient with multiple comorbidities including asthma, CKD, HTN, hyperparathyroidism, urinary incontinence  . Self-manages medications -- Does not use a pill box or other adherence strategies . New onset dizziness-per MD visit on 05/22/19 "This is a new problem. The current episode started in the past 7 days. The problem occurs intermittently. The problem has been unchanged. Associated symptoms include fatigue. Pertinent negatives include no urinary symptoms, visual change or vomiting. The symptoms are aggravated by standing. She has tried nothing for the symptoms." . Patient admits to being dehydrated, low intake of fluids . Recalls dizziness started near the time of oxybutynin initiation (late summer 2020)--patient held 2 doses of oxybutynin and does feel less dizzy.  She reports her urinary  problems have increased since holding this medication.  Pharmacist Clinical Goal(s):  Marland Kitchen Over the next 90 days, patient will work with PharmD and provider towards optimized medication management of chronic conditions  Interventions: Call completed with patient on 06/10/19 . Comprehensive medication review performed; medication list updated in electronic medical record . Reviewed medications that could potentially cause dizziness.  Oxybutynin was started late summer and patient states this may be what is causing this side effect.  Patient has remained stable on all other medications.  BP within normal limits.  Patient wishes to hold oxybutynin to see if dizziness resolves.  Encouraged patient to reach out to Urology today for future guidance before starting/stopping medication prescribed by urologist.  Dose of oxybutynin can be decreased to attempt to provide coverage while avoiding side effects.  Patient  verbalizes understanding. . Counseled patient to increase fluid intake to prevent dehydration . Counseled patient to avoid and sudden movements.  She must take her time standing/sitting/etc . Continue to take BP regularly at home o Recent BP/orthostatics at PCP office visit:  o   o Management of contributing risk factors, such as diabetes and hypertension, is critical to slowing progression of CKD.  Patient remains on ARB for CKD/HTN.  She reports compliance.  BP controlled/at goal as mentioned above.  o Medications dosed appropriately per renal function. Current GFR ~23mL/min  Patient Self Care Activities:  . Patient will take medications as prescribed . Patient will focus on improved adherence   Please see past updates related to this goal by clicking on the "Past Updates" button in the selected goal         The patient verbalized understanding of instructions provided today and declined a print copy of patient instruction materials.   The care management team will reach out to the patient again over the next 30 days.   SIGNATURE Regina Eck, PharmD, BCPS Clinical Pharmacist, [facility] [place]: [phone]

## 2019-06-15 NOTE — Progress Notes (Signed)
Chronic Care Management    Visit Note  06/10/2019 Name: Amanda Ross MRN: [id number] DOB: 1946-04-22  Referred by: [name], MD Reason for referral : Chronic Care Management   Amanda Ross is a 73 y.o. year old female who is a primary care patient of [name], MD. The CCM team was consulted for assistance with chronic disease management and care coordination needs related to CKD Stage 3, HTN  Review of patient status, including review of consultants reports, relevant laboratory and other test results, and collaboration with appropriate care team members and the patient's provider was performed as part of comprehensive patient evaluation and provision of chronic care management services.    I spoke with Amanda Ross by telephone today.  Medications: Outpatient Encounter Medications as of 06/10/2019  Medication Sig  . aspirin 81 MG tablet Take 81 mg by mouth daily.  . baclofen (LIORESAL) 10 MG tablet TAKE 1/2 TABLET BY MOUTH TWICE DAILY AS NEEDED  . Cholecalciferol (VITAMIN D) 1000 UNITS capsule Take 2,000 Units by mouth daily.   . diclofenac Sodium (VOLTAREN) 1 % GEL Apply 2 g to 4 g to affected area up to 4 times daily as needed.  . hydroxychloroquine (PLAQUENIL) 200 MG tablet Take 1 tablet (200 mg total) by mouth every morning.  Marland Kitchen ipratropium (ATROVENT) 0.03 % nasal spray Place 2 sprays into both nostrils every 12 (twelve) hours. (Patient taking differently: Place 2 sprays into both nostrils as needed. )  . losartan-hydrochlorothiazide (HYZAAR) 50-12.5 MG tablet Take 1 tablet by mouth daily.  . magnesium gluconate (MAGONATE) 500 MG tablet Take 500 mg by mouth daily.   . metoprolol tartrate (LOPRESSOR) 25 MG tablet TAKE 1/2 TABLET BY MOUTH TWICE DAILY  . mirabegron ER (MYRBETRIQ) 25 MG TB24 tablet Take 1 tablet (25 mg total) by mouth daily.  Marland Kitchen omeprazole (PRILOSEC) 20 MG capsule Take 1 capsule (20 mg total) by mouth daily. (Patient taking differently: Take 40 mg by  mouth daily. )  . oxybutynin (DITROPAN-XL) 10 MG 24 hr tablet TK 1 T PO QD  . traZODone (DESYREL) 50 MG tablet TAKE 1 TABLET BY MOUTH EVERY NIGHT AT BEDTIME   No facility-administered encounter medications on file as of 06/10/2019.     Objective:   Goals Addressed            This Visit's Progress     Patient Stated   . I would like to manage my chronic conditions (pt-stated)       Current Barriers:  . Polypharmacy; complex patient with multiple comorbidities including asthma, CKD, HTN, hyperparathyroidism, urinary incontinence  . Self-manages medications -- Does not use a pill box or other adherence strategies . New onset dizziness-per MD visit on 05/22/19 "This is a new problem. The current episode started in the past 7 days. The problem occurs intermittently. The problem has been unchanged. Associated symptoms include fatigue. Pertinent negatives include no urinary symptoms, visual change or vomiting. The symptoms are aggravated by standing. She has tried nothing for the symptoms." . Patient admits to being dehydrated, low intake of fluids . Recalls dizziness started near the time of oxybutynin initiation (late summer 2020)--patient held 2 doses of oxybutynin and does feel less dizzy.  She reports her urinary  problems have increased since holding this medication.  Pharmacist Clinical Goal(s):  Marland Kitchen Over the next 90 days, patient will work with PharmD and provider towards optimized medication management of chronic conditions  Interventions: Call completed with patient on 06/10/19 . Comprehensive medication  review performed; medication list updated in electronic medical record . Reviewed medications that could potentially cause dizziness.  Oxybutynin was started late summer and patient states this may be what is causing this side effect.  Patient has remained stable on all other medications.  BP within normal limits.  Patient wishes to hold oxybutynin to see if dizziness resolves.   Encouraged patient to reach out to Urology today for future guidance before starting/stopping medication prescribed by urologist.  Dose of oxybutynin can be decreased to attempt to provide coverage while avoiding side effects.  Patient verbalizes understanding. . Counseled patient to increase fluid intake to prevent dehydration . Counseled patient to avoid and sudden movements.  She must take her time standing/sitting/etc . Continue to take BP regularly at home o Recent BP/orthostatics at PCP office visit:  o   o Management of contributing risk factors, such as diabetes and hypertension, is critical to slowing progression of CKD.  Patient remains on ARB for CKD/HTN.  She reports compliance.  BP controlled/at goal as mentioned above.  o Medications dosed appropriately per renal function. Current GFR ~10mL/min  Patient Self Care Activities:  . Patient will take medications as prescribed . Patient will focus on improved adherence   Please see past updates related to this goal by clicking on the "Past Updates" button in the selected goal          Plan:   The care management team will reach out to the patient again over the next 30 days.   Provider Signature Regina Eck, PharmD, BCPS Clinical Pharmacist, [facility] [place]: [phone]

## 2019-06-22 ENCOUNTER — Other Ambulatory Visit: Payer: Self-pay

## 2019-06-22 MED ORDER — OMEPRAZOLE 20 MG PO CPDR: 40.0000 mg | DELAYED_RELEASE_CAPSULE | Freq: Every day | ORAL | 1 refills | Status: DC

## 2019-06-30 ENCOUNTER — Other Ambulatory Visit: Payer: Self-pay | Admitting: Internal Medicine

## 2019-06-30 ENCOUNTER — Other Ambulatory Visit: Payer: Self-pay | Admitting: Nurse Practitioner

## 2019-06-30 DIAGNOSIS — G4700 Insomnia, unspecified: Secondary | ICD-10-CM

## 2019-07-02 ENCOUNTER — Telehealth: Payer: Self-pay | Admitting: *Deleted

## 2019-07-02 NOTE — Telephone Encounter (Signed)
Received refill request via fax  Last Visit: 05/18/2019 telemedicine  Next Visit: 10/21/2019 Labs: CBC and CMP were drawn on 12/15/18. BMP updated on 03/24/19.  Eye exam: 01/05/19  Attempted to contact the patient and left message for patient to advised patient she is due to update labs and to call the office.

## 2019-07-06 ENCOUNTER — Ambulatory Visit: Payer: Self-pay

## 2019-07-06 ENCOUNTER — Telehealth: Payer: Self-pay

## 2019-07-06 DIAGNOSIS — I10 Essential (primary) hypertension: Secondary | ICD-10-CM

## 2019-07-06 DIAGNOSIS — N1831 Chronic kidney disease, stage 3a: Secondary | ICD-10-CM

## 2019-07-06 NOTE — Chronic Care Management (AMB) (Signed)
  Chronic Care Management   Outreach Note  07/06/2019 Name: Amanda Ross MRN: [id number] DOB: Feb 11, 1946  Referred by: [name], MD Reason for referral : Care Coordination   SW placed an unsuccessful outbound call to the patient to assess progression of patient goal and assist with care coordination needs. SW left a HIPAA compliant voice message requesting a return call.  Follow Up Plan: The care management team will reach out to the patient again over the next 14 days.   Daneen Schick, BSW, CDP Social Worker, Certified Dementia Practitioner [place] [facility] [place] [facility] [phone]

## 2019-07-09 DIAGNOSIS — Z79899 Other long term (current) drug therapy: Secondary | ICD-10-CM | POA: Diagnosis not present

## 2019-07-09 DIAGNOSIS — H40013 Open angle with borderline findings, low risk, bilateral: Secondary | ICD-10-CM | POA: Diagnosis not present

## 2019-07-09 DIAGNOSIS — M129 Arthropathy, unspecified: Secondary | ICD-10-CM | POA: Diagnosis not present

## 2019-07-09 DIAGNOSIS — H40033 Anatomical narrow angle, bilateral: Secondary | ICD-10-CM | POA: Diagnosis not present

## 2019-07-10 ENCOUNTER — Other Ambulatory Visit: Payer: Self-pay | Admitting: *Deleted

## 2019-07-10 DIAGNOSIS — M3509 Sicca syndrome with other organ involvement: Secondary | ICD-10-CM

## 2019-07-10 MED ORDER — HYDROXYCHLOROQUINE SULFATE 200 MG PO TABS: 200.0000 mg | ORAL_TABLET | Freq: Every morning | ORAL | 0 refills | Status: DC

## 2019-07-10 NOTE — Telephone Encounter (Signed)
Refill request received via fax  Last Visit:05/18/2019 telemedicine Next Visit:10/21/2019 Labs:CBC and CMP were drawn on 12/15/18. BMP updated on 03/24/19. Eye exam:01/05/19  Patient advised she is due to update labs. Patient will update 07/14/19.   Okay to refill 30 day supply PLQ?

## 2019-07-10 NOTE — Telephone Encounter (Signed)
Ok to refill 30 day supply of PLQ.

## 2019-07-15 ENCOUNTER — Encounter: Payer: Self-pay | Admitting: Internal Medicine

## 2019-07-17 ENCOUNTER — Telehealth: Payer: Self-pay

## 2019-07-17 ENCOUNTER — Ambulatory Visit: Payer: Self-pay

## 2019-07-17 DIAGNOSIS — I10 Essential (primary) hypertension: Secondary | ICD-10-CM

## 2019-07-17 DIAGNOSIS — N1831 Chronic kidney disease, stage 3a: Secondary | ICD-10-CM

## 2019-07-17 NOTE — Chronic Care Management (AMB) (Signed)
  Chronic Care Management   Outreach Note  07/17/2019 Name: Amanda Ross MRN: [id number] DOB: [birth year]  Referred by: [name], MD Reason for referral : Care Coordination   SW placed a second unsuccessful outbound call to the patient to follow up on resource referral for dental care. SW left a HIPAA compliant voice message requesting a return call.  Follow Up Plan: The care management team will reach out to the patient again over the next 14 days.   Daneen Schick, BSW, CDP Social Worker, Certified Dementia Practitioner [place] [facility] [place] [facility] [phone]

## 2019-07-22 ENCOUNTER — Telehealth: Payer: Self-pay

## 2019-07-28 ENCOUNTER — Telehealth: Payer: Self-pay

## 2019-07-28 ENCOUNTER — Ambulatory Visit: Payer: Self-pay

## 2019-07-28 DIAGNOSIS — I10 Essential (primary) hypertension: Secondary | ICD-10-CM

## 2019-07-28 DIAGNOSIS — N1831 Chronic kidney disease, stage 3a: Secondary | ICD-10-CM

## 2019-07-28 NOTE — Chronic Care Management (AMB) (Signed)
  Chronic Care Management    Social Work Follow Up Note  07/28/2019 Name: Amanda Ross MRN: [id number] DOB: 04/30/1946  Amanda Ross is a 74 y.o. year old female who is a primary care patient of [name], MD. The CCM team was consulted for assistance with care coordination.   SW placed a third unsuccessful outbound call to the patient to assist with care coordination needs. SW left a HIPAA compliant voice message requesting a return call. SW has closed patient dental goal due to an inability to assess goal progression.   Goals Addressed            This Visit's Progress     Patient Stated   . COMPLETED: "I need dental work but can't afford it" (pt-stated)       Current Barriers:  . Financial constraints related to the cost of dentures . COVID 19 Pandemic . Lack of dental coverage  Social Work Clinical Goal(s):  Marland Kitchen Over the next 45 days the patient will work with SW to identify dental resources available to the patient 07/28/19- Unable to assess goal progression due to inability to maintain contact with the patient  CCM SW Interventions: Completed 05/26/2019 . Outbound call to the patient to communicate lack of ability to locate dental clinics at this time . Reviewed importance of improving oral health due to impact on kidney disease . Discussed placing referral to [place] 2-1-1 to request assistance in locating dental resources for the patient . Placed referral to [place] 2-1-1 navigator via [place] . Scheduled follow up call to the patient over the next 6 weeks to determine outcome of referral  Patient Self Care Activities:  . Self administers medications as prescribed . Calls pharmacy for medication refills . Performs ADL's independently . Calls provider office for new concerns or questions  Please see past updates related to this goal by clicking on the "Past Updates" button in the selected goal         Follow Up Plan: No planned follow up at this time. The patient  will remain active with RN Case Manager and embedded PharmD.   Daneen Schick, BSW, CDP Social Worker, Certified Dementia Practitioner [place] [facility] [phone]

## 2019-07-29 ENCOUNTER — Other Ambulatory Visit: Payer: Self-pay

## 2019-07-29 ENCOUNTER — Ambulatory Visit (INDEPENDENT_AMBULATORY_CARE_PROVIDER_SITE_OTHER): Payer: Medicare Other | Admitting: Internal Medicine

## 2019-07-29 ENCOUNTER — Encounter: Payer: Self-pay | Admitting: Internal Medicine

## 2019-07-29 VITALS — BP 120/82 | HR 81 | Temp 98.5°F | Ht 63.2 in | Wt 227.8 lb

## 2019-07-29 DIAGNOSIS — R0600 Dyspnea, unspecified: Secondary | ICD-10-CM

## 2019-07-29 DIAGNOSIS — I129 Hypertensive chronic kidney disease with stage 1 through stage 4 chronic kidney disease, or unspecified chronic kidney disease: Secondary | ICD-10-CM | POA: Diagnosis not present

## 2019-07-29 DIAGNOSIS — N3281 Overactive bladder: Secondary | ICD-10-CM | POA: Diagnosis not present

## 2019-07-29 DIAGNOSIS — M3509 Sicca syndrome with other organ involvement: Secondary | ICD-10-CM

## 2019-07-29 DIAGNOSIS — Z23 Encounter for immunization: Secondary | ICD-10-CM

## 2019-07-29 DIAGNOSIS — N1831 Chronic kidney disease, stage 3a: Secondary | ICD-10-CM | POA: Diagnosis not present

## 2019-07-29 DIAGNOSIS — E661 Drug-induced obesity: Secondary | ICD-10-CM | POA: Diagnosis not present

## 2019-07-29 DIAGNOSIS — Z6841 Body Mass Index (BMI) 40.0 and over, adult: Secondary | ICD-10-CM | POA: Diagnosis not present

## 2019-07-29 DIAGNOSIS — R0609 Other forms of dyspnea: Secondary | ICD-10-CM

## 2019-07-30 ENCOUNTER — Telehealth: Payer: Self-pay

## 2019-07-30 LAB — BMP8+EGFR
BUN/Creatinine Ratio: 13 (ref 12–28)
BUN: 16 mg/dL (ref 8–27)
CO2: 23 mmol/L (ref 20–29)
Calcium: 10.4 mg/dL — ABNORMAL HIGH (ref 8.7–10.3)
Chloride: 106 mmol/L (ref 96–106)
Creatinine, Ser: 1.19 mg/dL — ABNORMAL HIGH (ref 0.57–1.00)
GFR calc Af Amer: 52 mL/min/{1.73_m2} — ABNORMAL LOW (ref >59)
GFR calc non Af Amer: 45 mL/min/{1.73_m2} — ABNORMAL LOW (ref >59)
Glucose: 106 mg/dL — ABNORMAL HIGH (ref 65–99)
Potassium: 4.2 mmol/L (ref 3.5–5.2)
Sodium: 143 mmol/L (ref 134–144)

## 2019-07-30 LAB — BRAIN NATRIURETIC PEPTIDE: BNP: 30.8 pg/mL (ref 0.0–100.0)

## 2019-07-30 NOTE — Telephone Encounter (Signed)
The pt was notified of her most recent lab results from her last appt with Dr. Baird Cancer.

## 2019-08-02 ENCOUNTER — Other Ambulatory Visit: Payer: Self-pay | Admitting: Internal Medicine

## 2019-08-02 DIAGNOSIS — R252 Cramp and spasm: Secondary | ICD-10-CM

## 2019-08-02 NOTE — Progress Notes (Signed)
This visit occurred during the SARS-CoV-2 public health emergency.  Safety protocols were in place, including screening questions prior to the visit, additional usage of staff PPE, and extensive cleaning of exam room while observing appropriate contact time as indicated for disinfecting solutions.  Subjective:     Patient ID: Amanda Ross , female    DOB: 07/25/1945 , 74 y.o.   MRN: [id number]   Chief Complaint  Patient presents with  . Hypertension  . Immunizations    Tdap    HPI  She is here today for BP check. She reports compliance with meds.   Hypertension This is a chronic problem. The current episode started more than 1 year ago. The problem has been gradually improving since onset. The problem is controlled. Associated symptoms include shortness of breath. Pertinent negatives include no blurred vision, chest pain or palpitations. Risk factors for coronary artery disease include obesity, post-menopausal state and sedentary lifestyle. The current treatment provides moderate improvement. Compliance problems include exercise.      Past Medical History:  Diagnosis Date  . Asthma   . GERD (gastroesophageal reflux disease)   . H/O bladder infections   . H/O measles   . H/O varicella   . Headache(784.0)    Frequently  . Hypertension   . OSA (obstructive sleep apnea)   . Osteoarthritis   . Yeast infection      Family History  Problem Relation Age of Onset  . Asthma Daughter   . Heart disease Mother        CHF  . Deep vein thrombosis Mother   . Rheum arthritis Mother   . Heart disease Brother   . Rheum arthritis Sister      Current Outpatient Medications:  .  aspirin 81 MG tablet, Take 81 mg by mouth daily., Disp: , Rfl:  .  baclofen (LIORESAL) 10 MG tablet, TAKE 1/2 TABLET BY MOUTH TWICE DAILY AS NEEDED, Disp: 30 tablet, Rfl: 0 .  Cholecalciferol (VITAMIN D) 1000 UNITS capsule, Take 2,000 Units by mouth daily. , Disp: , Rfl:  .  hydroxychloroquine (PLAQUENIL)  200 MG tablet, Take 1 tablet (200 mg total) by mouth every morning., Disp: 30 tablet, Rfl: 0 .  ipratropium (ATROVENT) 0.03 % nasal spray, Place 2 sprays into both nostrils every 12 (twelve) hours. (Patient taking differently: Place 2 sprays into both nostrils as needed. ), Disp: 30 mL, Rfl: 12 .  losartan-hydrochlorothiazide (HYZAAR) 50-12.5 MG tablet, TAKE 1 TABLET BY MOUTH DAILY, Disp: 90 tablet, Rfl: 1 .  magnesium gluconate (MAGONATE) 500 MG tablet, Take 500 mg by mouth daily. , Disp: , Rfl:  .  metoprolol tartrate (LOPRESSOR) 25 MG tablet, TAKE 1/2 TABLET BY MOUTH TWICE DAILY, Disp: 90 tablet, Rfl: 1 .  mirabegron ER (MYRBETRIQ) 25 MG TB24 tablet, Take 1 tablet (25 mg total) by mouth daily., Disp: 1 tablet, Rfl: 0 .  omeprazole (PRILOSEC) 20 MG capsule, Take 2 capsules (40 mg total) by mouth daily., Disp: 90 capsule, Rfl: 1 .  traZODone (DESYREL) 50 MG tablet, TAKE 1 TABLET BY MOUTH EVERY NIGHT AT BEDTIME, Disp: 90 tablet, Rfl: 0 .  diclofenac Sodium (VOLTAREN) 1 % GEL, Apply 2 g to 4 g to affected area up to 4 times daily as needed., Disp: 400 g, Rfl: 0 .  oxybutynin (DITROPAN-XL) 10 MG 24 hr tablet, TK 1 T PO QD, Disp: , Rfl:    Allergies  Allergen Reactions  . Ivp Dye [Iodinated Diagnostic Agents] Hives  . Iohexol   .  Penicillins Hives  . Percocet [Oxycodone-Acetaminophen]   . Prednisone Nausea Only     Review of Systems  Constitutional: Negative.   Eyes: Negative for blurred vision.  Respiratory: Positive for shortness of breath.   Cardiovascular: Negative.  Negative for chest pain and palpitations.  Gastrointestinal: Negative.   Genitourinary: Positive for urgency. Negative for vaginal bleeding.  Neurological: Negative.   Psychiatric/Behavioral: Negative.      Today's Vitals   07/29/19 1449  BP: 120/82  Pulse: 81  Temp: 98.5 F (36.9 C)  TempSrc: Oral  Weight: 227 lb 12.8 oz (103.3 kg)  Height: 5' 3.2" (1.605 m)   Body mass index is 40.1 kg/m.   Objective:   Physical Exam Vitals and nursing note reviewed.  Constitutional:      Appearance: Normal appearance. She is obese.  HENT:     Head: Normocephalic and atraumatic.  Cardiovascular:     Rate and Rhythm: Normal rate and regular rhythm.     Heart sounds: Normal heart sounds.  Pulmonary:     Effort: Pulmonary effort is normal.     Breath sounds: Normal breath sounds.  Skin:    General: Skin is warm.  Neurological:     General: No focal deficit present.     Mental Status: She is alert.  Psychiatric:        Mood and Affect: Mood normal.        Behavior: Behavior normal.         Assessment And Plan:     1. Hypertensive nephropathy  Chronic, well controlled. She will continue with current meds. She is encouraged to avoid adding salt to her foods.   2. Stage 3a chronic kidney disease  Chronic, yet stable. I will check renal function today. She is encourage to stay well hydrated.   - BMP8+EGFR  3. OAB (overactive bladder)  Chronic. She has stopped oxybutynin due to side effects. Has been on Myrbetriq in the past, but did not totally help her sx. I will advise her to increase dose to 23m (she will take 2-235mtabs) daily.  She is encouraged to let me know if her sx improve.   4. Sjogren's syndrome with other organ involvement ([facility] Chronic,as per Rheumatology.   5. Dyspnea on exertion  I will check BNP and refer her for echocardiogram. I will make further recommendations once her labs/tests are available for review.   - ECHOCARDIOGRAM COMPLETE; Future  6. Class 3 drug-induced obesity with serious comorbidity and body mass index (BMI) of 40.0 to 44.9 in adult (HCC)  BMI 40. She is encouraged to strive for BMI less than 35 to decrease cardiac risk. Importance of regular exercise was discussed with the patient.   7. Immunization due  She has upcoming COVID vaccine appt; therefore, I will not send TDap to the pharmacy. I will send for administration once she has completed  full vaccine course for COVID.   RoMaximino GreenlandMD    THE PATIENT IS ENCOURAGED TO PRACTICE SOCIAL DISTANCING DUE TO THE COVID-19 PANDEMIC.

## 2019-08-03 ENCOUNTER — Telehealth: Payer: Self-pay

## 2019-08-03 ENCOUNTER — Ambulatory Visit: Payer: Medicare Other | Admitting: Pharmacist

## 2019-08-03 DIAGNOSIS — N1831 Chronic kidney disease, stage 3a: Secondary | ICD-10-CM

## 2019-08-03 NOTE — Telephone Encounter (Signed)
Dr. Baird Cancer wants to know does the pt  have Myrbetriq at home? If yes, confirm it is 25mg  if yes, increase to 2 tabs daily. should help with bladder issues.  Dr. Baird Cancer wants the pt to call in a week or 2 to let her know how she is doing.

## 2019-08-04 DIAGNOSIS — Z23 Encounter for immunization: Secondary | ICD-10-CM | POA: Diagnosis not present

## 2019-08-04 DIAGNOSIS — L570 Actinic keratosis: Secondary | ICD-10-CM | POA: Diagnosis not present

## 2019-08-04 DIAGNOSIS — L821 Other seborrheic keratosis: Secondary | ICD-10-CM | POA: Diagnosis not present

## 2019-08-04 DIAGNOSIS — L301 Dyshidrosis [pompholyx]: Secondary | ICD-10-CM | POA: Diagnosis not present

## 2019-08-05 ENCOUNTER — Other Ambulatory Visit: Payer: Self-pay | Admitting: *Deleted

## 2019-08-05 DIAGNOSIS — M3509 Sicca syndrome with other organ involvement: Secondary | ICD-10-CM

## 2019-08-05 MED ORDER — HYDROXYCHLOROQUINE SULFATE 200 MG PO TABS: 200.0000 mg | ORAL_TABLET | Freq: Every morning | ORAL | 0 refills | Status: DC

## 2019-08-05 NOTE — Telephone Encounter (Signed)
Refill request received via fax  Last Visit:05/18/2019 telemedicine Next Visit:10/21/2019 Labs:CBC and CMP were drawn on 12/15/18. BMP updated on 07/29/19 Creat 1.19 GFR 52 Glucose 106,Calcium 10.4 Eye exam:01/05/19  Okay to refill PLQ?

## 2019-08-05 NOTE — Telephone Encounter (Signed)
ok.  Please advise patient to get repeat labs.

## 2019-08-05 NOTE — Telephone Encounter (Signed)
Attempted to contact the patient and left message for patient to call the office.  

## 2019-08-10 NOTE — Progress Notes (Signed)
  Chronic Care Management   Outreach Note  08/03/2019 Name: Amanda Ross MRN: [id number] DOB: 07-02-1945  Referred by: [name], MD Reason for referral : Chronic Care Management   An unsuccessful telephone outreach was attempted today. The patient was referred to the case management team for assistance with care management and care coordination.   Follow Up Plan: A HIPPA compliant phone message was left for the patient providing contact information and requesting a return call.  The care management team will reach out to the patient again over the next 30 days.   SIGNATURE Regina Eck, PharmD, BCPS Clinical Pharmacist, [facility] Ali Molina: [phone]

## 2019-08-17 ENCOUNTER — Ambulatory Visit: Payer: Medicare Other | Attending: Internal Medicine

## 2019-08-17 DIAGNOSIS — Z23 Encounter for immunization: Secondary | ICD-10-CM | POA: Insufficient documentation

## 2019-08-17 HISTORY — PX: OTHER SURGICAL HISTORY: SHX169

## 2019-08-17 NOTE — Progress Notes (Signed)
   Covid-19 Vaccination Clinic  Name:  Amanda Ross    MRN: [id number] DOB: Oct 05, 1945  08/17/2019  Ms. Fish was observed post Covid-19 immunization for 15 minutes without incidence. She was provided with Vaccine Information Sheet and instruction to access the V-Safe system.   [name] was instructed to call 911 with any severe reactions post vaccine: Marland Kitchen Difficulty breathing  . Swelling of your face and throat  . A fast heartbeat  . A bad rash all over your body  . Dizziness and weakness    Immunizations Administered    Name Date Dose VIS Date Route   Pfizer COVID-19 Vaccine 08/17/2019  9:10 AM 0.3 mL 05/29/2019 Intramuscular   Manufacturer: [place]   Lot: [id number]   [place]: [id number]

## 2019-09-01 ENCOUNTER — Other Ambulatory Visit: Payer: Self-pay | Admitting: Internal Medicine

## 2019-09-01 DIAGNOSIS — R252 Cramp and spasm: Secondary | ICD-10-CM

## 2019-09-05 ENCOUNTER — Other Ambulatory Visit: Payer: Self-pay | Admitting: Internal Medicine

## 2019-09-05 DIAGNOSIS — R252 Cramp and spasm: Secondary | ICD-10-CM

## 2019-09-08 ENCOUNTER — Telehealth: Payer: Self-pay | Admitting: Rheumatology

## 2019-09-08 ENCOUNTER — Telehealth: Payer: Self-pay

## 2019-09-08 NOTE — Telephone Encounter (Signed)
Patient called stating she is not sure if she still needs labwork for Dr. Estanislado Pandy.  Patient states she had labwork in February with Dr. Baird Cancer.  Patient is requesting a prescription refill of Plaquenil.

## 2019-09-09 ENCOUNTER — Ambulatory Visit: Payer: Medicare Other | Attending: Internal Medicine

## 2019-09-09 DIAGNOSIS — Z23 Encounter for immunization: Secondary | ICD-10-CM

## 2019-09-09 NOTE — Progress Notes (Signed)
   Covid-19 Vaccination Clinic  Name:  Amanda Ross    MRN: [id number] DOB: [birth year]  09/09/2019  Amanda Ross was observed post Covid-19 immunization for 15 minutes without incident. She was provided with Vaccine Information Sheet and instruction to access the V-Safe system.   Amanda Ross was instructed to call 911 with any severe reactions post vaccine: Marland Kitchen Difficulty breathing  . Swelling of face and throat  . A fast heartbeat  . A bad rash all over body  . Dizziness and weakness   Immunizations Administered    Name Date Dose VIS Date Route   Pfizer COVID-19 Vaccine 09/09/2019  8:59 AM 0.3 mL 05/29/2019 Intramuscular   Manufacturer: [place]   Lot: [id number]   [place]: [id number]

## 2019-09-13 ENCOUNTER — Other Ambulatory Visit: Payer: Self-pay | Admitting: Internal Medicine

## 2019-09-14 ENCOUNTER — Ambulatory Visit: Payer: Self-pay

## 2019-09-14 ENCOUNTER — Ambulatory Visit (INDEPENDENT_AMBULATORY_CARE_PROVIDER_SITE_OTHER): Payer: Medicare Other | Admitting: Orthopaedic Surgery

## 2019-09-14 ENCOUNTER — Encounter: Payer: Self-pay | Admitting: Orthopaedic Surgery

## 2019-09-14 ENCOUNTER — Other Ambulatory Visit: Payer: Self-pay

## 2019-09-14 VITALS — BP 151/93 | HR 78 | Ht 63.0 in | Wt 220.0 lb

## 2019-09-14 DIAGNOSIS — M545 Low back pain, unspecified: Secondary | ICD-10-CM

## 2019-09-15 ENCOUNTER — Other Ambulatory Visit: Payer: Self-pay

## 2019-09-15 NOTE — Telephone Encounter (Signed)
The pt was asked to clarify if she is taking Losartan-hctz or Olmesartan for her blood presure.  The pt said that she is only taking the Losartan-HCTZ, that she was prescribed the Olmesartan when she went to urgent care a while ago and that she didn't know why they prescribed it.

## 2019-09-21 NOTE — Progress Notes (Signed)
Office Visit Note   Patient: Amanda Ross           Date of Birth: 1945-06-24           MRN: [id number] Visit Date: 09/14/2019              Requested by: [name], [address] [place],  [place] [address] PCP: [name], MD   Assessment & Plan: Visit Diagnoses:  1. Acute left-sided low back pain, unspecified whether sciatica present     Plan: We will set patient up for some physical therapy to work on balance and strengthening.  She does not think her pain symptoms are severe enough to consider operative intervention and I expect her fall risk  rate could be significantly improved.  He can follow-up after therapy in about 6 weeks.  Follow-Up Instructions: Return in about 6 weeks (around 10/26/2019).   Orders:  Orders Placed This Encounter  Procedures  . XR Lumbar Spine 2-3 Views  . XR Pelvis 1-2 Views   No orders of the defined types were placed in this encounter.     Procedures: No procedures performed   Clinical Data: No additional findings.   Subjective: Chief Complaint  Patient presents with  . Lower Back - Pain  . Left Leg - Pain    HPI 74 year old female long-term patient presents with pain in her back left buttocks pain that radiates into her left thigh which has been present for about a month.  States she has pain when she sits pain is in the sciatic notch region and she has increased pain when she puts her foot on the floor.  She has been using her lumbar corset which is giving her some relief also use Tylenol which does not seem to help with the pain.  She is allergic to penicillin Percocet IV dye.  Patient was last seen in 2019.  She does have history of Sica syndrome and Sjogren's syndrome.  Previous disc protrusion worse in the left than right by MRI scan.  She has had rotator cuff repair in the past .  Review of Systems 14 point update unchanged from 04/22/2018 other than as mentioned in HPI.  Objective: Vital Signs: BP  (!) 151/93   Pulse 78   Ht 5\' 3"  (1.6 m)   Wt 220 lb (99.8 kg)   BMI 38.97 kg/m   Physical Exam Constitutional:      Appearance: She is well-developed.  HENT:     Head: Normocephalic.     Right Ear: External ear normal.     Left Ear: External ear normal.  Eyes:     Pupils: Pupils are equal, round, and reactive to light.  Neck:     Thyroid: No thyromegaly.     Trachea: No tracheal deviation.  Cardiovascular:     Rate and Rhythm: Normal rate.  Pulmonary:     Effort: Pulmonary effort is normal.  Abdominal:     Palpations: Abdomen is soft.  Skin:    General: Skin is warm and dry.  Neurological:     Mental Status: She is alert and oriented to person, place, and time.  Psychiatric:        Behavior: Behavior normal.     Ortho Exam Patient gets from sitting standing slowly she ambulates and when she turns or rotates she has some balance issues reaches for the table or or moves slowly with her hands spread to the side to maintain her balance.  Decreased core strength noted.  Hip flexors are strong negative straight leg raising 90 degrees.  Knee and ankle jerk are intact anterior tib ankle dorsiflexion and normal plantar flexion right and left symmetrical.  Distal pulses are normal. Specialty Comments:  No specialty comments available.  Imaging: No results found.   PMFS History: Patient Active Problem List   Diagnosis Date Noted  . Vitamin D deficiency, unspecified 03/05/2018  . Mixed hyperlipidemia 03/05/2018  . 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 Pain in unspecified joint 03/05/2018  . Abnormal glucose 03/05/2018  . Trochanteric bursitis of both hips 07/30/2017  . Sjogren's syndrome ([place]) 05/22/2016  . Sicca syndrome, unspecified ([place]) 05/22/2016  . High risk medication use 05/22/2016  . Primary hyperparathyroidism ([place]) 07/09/2013  . Left thyroid nodule 07/06/2013  . Dyspnea 10/11/2010   Past Medical History:  Diagnosis Date  . Asthma   . GERD  (gastroesophageal reflux disease)   . H/O bladder infections   . H/O measles   . H/O varicella   . Headache(784.0)    Frequently  . Hypertension   . OSA (obstructive sleep apnea)   . Osteoarthritis   . Yeast infection     Family History  Problem Relation Age of Onset  . Asthma Daughter   . Heart disease Mother        CHF  . Deep vein thrombosis Mother   . Rheum arthritis Mother   . Heart disease Brother   . Rheum arthritis Sister     Past Surgical History:  Procedure Laterality Date  . BUNIONECTOMY    . CARPAL TUNNEL RELEASE    . TOTAL SHOULDER ARTHROPLASTY     Social History   Occupational History  . 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 Smoking status: Former Smoker    Packs/day: 1.00    Years: 40.00    Pack years: 40.00    Types: Cigarettes    Quit date: 06/18/2005    Years since quitting: 14.2  . Smokeless tobacco: Never Used  Substance and Sexual Activity  . Alcohol use: Not Currently    Comment: rare  . Drug use: No  . Sexual activity: Not Currently

## 2019-09-22 ENCOUNTER — Ambulatory Visit: Payer: Self-pay

## 2019-09-22 DIAGNOSIS — N1831 Chronic kidney disease, stage 3a: Secondary | ICD-10-CM

## 2019-09-22 NOTE — Chronic Care Management (AMB) (Signed)
  Chronic Care Management   Outreach Note  09/22/2019 Name: Amanda Ross MRN: [id number] DOB: [birth year]  Referred by: [name], MD Reason for referral : Care Coordination   SW placed an unsuccessful outbound call to the patient to assist with care coordination needs. SW left a HIPAA compliant voice message requesting a return call.   Follow Up Plan: Appointment scheduled for RN Care Manager follow up call for 10/20/2019.  Daneen Schick, BSW, CDP Social Worker, Certified Dementia Practitioner [place] [facility] [place] [facility] [phone]

## 2019-10-01 ENCOUNTER — Other Ambulatory Visit: Payer: Self-pay | Admitting: Nurse Practitioner

## 2019-10-01 DIAGNOSIS — G4700 Insomnia, unspecified: Secondary | ICD-10-CM

## 2019-10-01 NOTE — Telephone Encounter (Signed)
Trazodone refill request 

## 2019-10-16 NOTE — Progress Notes (Signed)
Office Visit Note  Patient: Amanda Ross             Date of Birth: [birth year]           MRN: [id number]             PCP: [name], MD Referring: [name], MD Visit Date: 10/21/2019 Occupation: @GUAROCC @  Subjective:  Pain in both SI joints   History of Present Illness: SHENG INBODEN is a 74 y.o. female with history of Sjogren's syndrome and DDD.She has ongoing sicca symptoms.  She uses eyedrops for symptomatic relief.  She is not using any OTC products for mouth dryness.  Biotene products were ineffective in the past. She has intermittent pain in the right hand, especially the right CMC joint. She has pain in both knee joints when going down steps.  She denies any joint swelling. She has chronic pain in the C-spine.  She experiences stiffness in her neck at times. She has intermittent pain in her lower back, and she has radiating pain down the left side. She has ongoing trochanteric bursitis bilaterally. She has difficulty sleeping at night due to the discomfort. She takes tylenol as needed for pain relief.   Activities of Daily Living:  Patient reports morning stiffness for 3 hours.   Patient Reports nocturnal pain.  Difficulty dressing/grooming: Reports Difficulty climbing stairs: Reports Difficulty getting out of chair: Reports Difficulty using hands for taps, buttons, cutlery, and/or writing: Reports  Review of Systems  Constitutional: Positive for fatigue.  HENT: Positive for mouth dryness. Negative for mouth sores and nose dryness.   Eyes: Negative for pain, itching, visual disturbance and dryness.  Respiratory: Negative for cough, hemoptysis, shortness of breath and difficulty breathing.   Cardiovascular: Negative for chest pain, palpitations, hypertension and swelling in legs/feet.  Gastrointestinal: Positive for constipation. Negative for blood in stool and diarrhea.  Endocrine: Negative for increased urination.  Genitourinary: Negative for difficulty  urinating and painful urination.  Musculoskeletal: Positive for arthralgias, joint pain, joint swelling, myalgias, morning stiffness, muscle tenderness and myalgias. Negative for muscle weakness.  Skin: Positive for rash. Negative for color change, pallor, hair loss, nodules/bumps, redness, skin tightness, ulcers and sensitivity to sunlight.  Allergic/Immunologic: Negative for susceptible to infections.  Neurological: Positive for headaches. Negative for dizziness and weakness.  Hematological: Positive for bruising/bleeding tendency. Negative for swollen glands.  Psychiatric/Behavioral: Negative for depressed mood, confusion and sleep disturbance. The patient is not nervous/anxious.     PMFS History:  Patient Active Problem List   Diagnosis Date Noted  . Vitamin D deficiency, unspecified 03/05/2018  . Mixed hyperlipidemia 03/05/2018  . 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 Pain in unspecified joint 03/05/2018  . Abnormal glucose 03/05/2018  . Trochanteric bursitis of both hips 07/30/2017  . Sjogren's syndrome ([place]) 05/22/2016  . Sicca syndrome, unspecified ([place]) 05/22/2016  . High risk medication use 05/22/2016  . Primary hyperparathyroidism ([place]) 07/09/2013  . Left thyroid nodule 07/06/2013  . Dyspnea 10/11/2010    Past Medical History:  Diagnosis Date  . Asthma   . GERD (gastroesophageal reflux disease)   . H/O bladder infections   . H/O measles   . H/O varicella   . Headache(784.0)    Frequently  . Hypertension   . OSA (obstructive sleep apnea)   . Osteoarthritis   . Yeast infection     Family History  Problem Relation Age of Onset  . Asthma Daughter   . Heart disease Mother  CHF  . Deep vein thrombosis Mother   . Rheum arthritis Mother   . Heart disease Brother   . Rheum arthritis Sister    Past Surgical History:  Procedure Laterality Date  . BUNIONECTOMY    . CARPAL TUNNEL RELEASE    . dental work  08/2019  . TOTAL SHOULDER ARTHROPLASTY      Social History   Social History Narrative  . Not on file   Immunization History  Administered Date(s) Administered  . Influenza Split 03/25/2013  . Influenza, High Dose Seasonal PF 03/19/2018, 02/26/2019  . PFIZER SARS-COV-2 Vaccination 08/17/2019, 09/09/2019  . Pneumococcal-Unspecified 04/11/2012     Objective: Vital Signs: BP 130/79 (BP Location: Left Arm, Patient Position: Sitting, Cuff Size: Normal)   Pulse 72   Resp 17   Ht 5\' 3"  (1.6 m)   Wt 224 lb (101.6 kg)   BMI 39.68 kg/m    Physical Exam Vitals and nursing note reviewed.  Constitutional:      Appearance: She is well-developed.  HENT:     Head: Normocephalic and atraumatic.  Eyes:     Conjunctiva/sclera: Conjunctivae normal.  Pulmonary:     Effort: Pulmonary effort is normal.  Abdominal:     General: Bowel sounds are normal.     Palpations: Abdomen is soft.  Musculoskeletal:     Cervical back: Normal range of motion.  Lymphadenopathy:     Cervical: No cervical adenopathy.  Skin:    General: Skin is warm and dry.     Capillary Refill: Capillary refill takes less than 2 seconds.  Neurological:     Mental Status: She is alert and oriented to person, place, and time.  Psychiatric:        Behavior: Behavior normal.      Musculoskeletal Exam: C-spine limited range of motion with lateral rotation.  Thoracic and lumbar spine good range of motion.  No midline spinal tenderness.  She is tenderness over bilateral SI joints.  Shoulder joints, elbow joints, wrist joints, MCPs, PIPs and DIPs good range of motion with no synovitis.  She has PIP and DIP thickening consistent with osteoarthritis of both hands.  Tenderness of the right CMC joint noted.  Hip joints have good range of motion with no discomfort.  She has tenderness to palpation over bilateral trochanter bursa.  Knee joints have good range of motion with no warmth or effusion.  Ankle joints have good range of motion no tenderness or inflammation.  CDAI  Exam: CDAI Score: -- Patient Global: --; Provider Global: -- Swollen: --; Tender: -- Joint Exam 10/21/2019   No joint exam has been documented for this visit   There is currently no information documented on the homunculus. Go to the Rheumatology activity and complete the homunculus joint exam.  Investigation: No additional findings.  Imaging: XR Pelvis 1-2 Views  Result Date: 10/21/2019 No SI joint to sclerosis or narrowing was noted.  No hip joint narrowing was noted. Impression: Unremarkable x-ray of the hip joints.   Recent Labs: Lab Results  Component Value Date   WBC 8.6 12/15/2018   HGB 13.4 12/15/2018   PLT 227 12/15/2018   NA 143 07/29/2019   K 4.2 07/29/2019   CL 106 07/29/2019   CO2 23 07/29/2019   GLUCOSE 106 (H) 07/29/2019   BUN 16 07/29/2019   CREATININE 1.19 (H) 07/29/2019   BILITOT 0.3 12/15/2018   ALKPHOS 83 04/02/2018   AST 18 12/15/2018   ALT 17 12/15/2018   PROT 6.7 01/07/2019  ALBUMIN 4.1 04/02/2018   CALCIUM 10.4 (H) 07/29/2019   GFRAA 52 (L) 07/29/2019    Speciality Comments: PLQ eye exam: 07/09/19 Normal. [facility]. Follow up in 6 months.  Procedures:  No procedures performed Allergies: Ivp dye [iodinated diagnostic agents], Iohexol, Penicillins, Percocet [oxycodone-acetaminophen], and Prednisone      Assessment / Plan:     Visit Diagnoses: Sjogren's syndrome with other organ involvement [facility]): She has chronic sicca symptoms.  She has been using over-the-counter eyedrops as needed for symptomatic relief.  She is not using anything for mouth dryness.  She has tried Biotene products in the past which were ineffective.  She is clinically doing well on Plaquenil 200 mg 1 tablet by mouth daily.  She will continue taking Plaquenil as prescribed.  She does not need any refills at this time.  We will check CBC, CMP, and UA today.  She was advised to notify us if she develops any new or worsening symptoms.  She will follow-up in the office  in 5 months.- Plan: CBC with Differential/Platelet, COMPLETE METABOLIC PANEL WITH GFR, Urinalysis, Routine w reflex microscopic  High risk medication use - Plaquenil 200 mg 1 tablet by mouth daily.CBC and CMP were drawn on 12/15/2018.  BMP was checked on 07/29/2019.  Orders for CBC and CMP were released today.  Plaquenil eye exam was normal on 07/09/2019 at [facility].  She will follow-up in 6 months.  - Plan: CBC with Differential/Platelet, COMPLETE METABOLIC PANEL WITH GFR  Trochanteric bursitis of both hips: She has tenderness to palpation of trochanteric bursa bilaterally.  She was given a handout of exercises to perform.  She declined physical therapy at this time.  Spinal stenosis of cervical region: She has chronic neck pain and stiffness.  No symptoms of radiculopathy.  She was advised to perform neck exercises.   DDD (degenerative disc disease), cervical: She has limited range of motion and discomfort of the C-spine.  She experiences trapezius muscle tension and muscle tenderness bilaterally.  Discussed the importance of using warm moist heat and massage for symptomatic relief.  She is not experiencing any symptoms of radiculopathy currently.  She was given a handout of neck exercises to perform.  Chronic SI joint pain -She has tenderness to palpation of bilateral SI joints.  She has been experiencing intermittent discomfort and has nocturnal pain at times.  According to the patient she experiences radiating pain down both legs especially her left lower extremity.  She has ongoing trochanter bursitis bilaterally.  She is tenderness to palpation of bilateral trochanter bursa.  X-rays of the pelvis were obtained today.  She was given a handout of exercises to perform.  A referral to physical therapy was recommended but she declined at this time. Plan: XR Pelvis 1-2 Views  Other medical conditions are listed as follows:   Primary hyperparathyroidism ([place])  Cervical  lymphadenopathy   Orders: Orders Placed This Encounter  Procedures  . XR Pelvis 1-2 Views  . CBC with Differential/Platelet  . COMPLETE METABOLIC PANEL WITH GFR  . Urinalysis, Routine w reflex microscopic   No orders of the defined types were placed in this encounter.   Face-to-face time spent with patient was 30 minutes. Greater than 50% of time was spent in counseling and coordination of care.  Follow-Up Instructions: Return in about 5 months (around 03/22/2020) for Sjogren's syndrome, DDD.  Hazel Sams, PA-C  I examined and evaluated the patient with Hazel Sams PA.  Patient has been having some tenderness over trochanteric  area on my exam.  Hip joints were in good range of motion.  The x-ray of the SI joints and the hips was unremarkable.  The plan of care was discussed as noted above.  Bo Merino, MD   Note - This record has been created using Editor, commissioning.  Chart creation errors have been sought, but may not always  have been located. Such creation errors do not reflect on  the standard of medical care.

## 2019-10-20 ENCOUNTER — Telehealth: Payer: Self-pay

## 2019-10-21 ENCOUNTER — Ambulatory Visit: Payer: Medicare Other

## 2019-10-21 ENCOUNTER — Encounter: Payer: Self-pay | Admitting: Rheumatology

## 2019-10-21 ENCOUNTER — Other Ambulatory Visit: Payer: Self-pay

## 2019-10-21 ENCOUNTER — Ambulatory Visit (INDEPENDENT_AMBULATORY_CARE_PROVIDER_SITE_OTHER): Payer: Medicare Other | Admitting: Rheumatology

## 2019-10-21 VITALS — BP 130/79 | HR 72 | Resp 17 | Ht 63.0 in | Wt 224.0 lb

## 2019-10-21 DIAGNOSIS — E210 Primary hyperparathyroidism: Secondary | ICD-10-CM

## 2019-10-21 DIAGNOSIS — M7062 Trochanteric bursitis, left hip: Secondary | ICD-10-CM

## 2019-10-21 DIAGNOSIS — R590 Localized enlarged lymph nodes: Secondary | ICD-10-CM

## 2019-10-21 DIAGNOSIS — M7061 Trochanteric bursitis, right hip: Secondary | ICD-10-CM | POA: Diagnosis not present

## 2019-10-21 DIAGNOSIS — Z79899 Other long term (current) drug therapy: Secondary | ICD-10-CM

## 2019-10-21 DIAGNOSIS — G8929 Other chronic pain: Secondary | ICD-10-CM

## 2019-10-21 DIAGNOSIS — M533 Sacrococcygeal disorders, not elsewhere classified: Secondary | ICD-10-CM

## 2019-10-21 DIAGNOSIS — M3509 Sicca syndrome with other organ involvement: Secondary | ICD-10-CM | POA: Diagnosis not present

## 2019-10-21 DIAGNOSIS — M4802 Spinal stenosis, cervical region: Secondary | ICD-10-CM | POA: Diagnosis not present

## 2019-10-21 DIAGNOSIS — M5030 Other cervical disc degeneration, unspecified cervical region: Secondary | ICD-10-CM | POA: Diagnosis not present

## 2019-10-21 NOTE — Patient Instructions (Signed)
Hip Bursitis Rehab Ask your health care provider which exercises are safe for you. Do exercises exactly as told by your health care provider and adjust them as directed. It is normal to feel mild stretching, pulling, tightness, or discomfort as you do these exercises. Stop right away if you feel sudden pain or your pain gets worse. Do not begin these exercises until told by your health care provider. Stretching exercise This exercise warms up your muscles and joints and improves the movement and flexibility of your hip. This exercise also helps to relieve pain and stiffness. Iliotibial band stretch An iliotibial band is a strong band of muscle tissue that runs from the outer side of your hip to the outer side of your thigh and knee. 1. Lie on your side with your left / right leg in the top position. 2. Bend your left / right knee and grab your ankle. Stretch out your bottom arm to help you balance. 3. Slowly bring your knee back so your thigh is behind your body. 4. Slowly lower your knee toward the floor until you feel a gentle stretch on the outside of your left / right thigh. If you do not feel a stretch and your knee will not fall farther, place the heel of your other foot on top of your knee and pull your knee down toward the floor with your foot. 5. Hold this position for __________ seconds. 6. Slowly return to the starting position. Repeat __________ times. Complete this exercise __________ times a day. Strengthening exercises These exercises build strength and endurance in your hip and pelvis. Endurance is the ability to use your muscles for a long time, even after they get tired. Bridge This exercise strengthens the muscles that move your thigh backward (hip extensors). 1. Lie on your back on a firm surface with your knees bent and your feet flat on the floor. 2. Tighten your buttocks muscles and lift your buttocks off the floor until your trunk is level with your thighs. ? Do not arch  your back. ? You should feel the muscles working in your buttocks and the back of your thighs. If you do not feel these muscles, slide your feet 1-2 inches (2.5-5 cm) farther away from your buttocks. ? If this exercise is too easy, try doing it with your arms crossed over your chest. 3. Hold this position for __________ seconds. 4. Slowly lower your hips to the starting position. 5. Let your muscles relax completely after each repetition. Repeat __________ times. Complete this exercise __________ times a day. Squats This exercise strengthens the muscles in front of your thigh and knee (quadriceps). 1. Stand in front of a table, with your feet and knees pointing straight ahead. You may rest your hands on the table for balance but not for support. 2. Slowly bend your knees and lower your hips like you are going to sit in a chair. ? Keep your weight over your heels, not over your toes. ? Keep your lower legs upright so they are parallel with the table legs. ? Do not let your hips go lower than your knees. ? Do not bend lower than told by your health care provider. ? If your hip pain increases, do not bend as low. 3. Hold the squat position for __________ seconds. 4. Slowly push with your legs to return to standing. Do not use your hands to pull yourself to standing. Repeat __________ times. Complete this exercise __________ times a day. Hip hike 1. Stand   sideways on a bottom step. Stand on your left / right leg with your other foot unsupported next to the step. You can hold on to the railing or wall for balance if needed. 2. Keep your knees straight and your torso square. Then lift your left / right hip up toward the ceiling. 3. Hold this position for __________ seconds. 4. Slowly let your left / right hip lower toward the floor, past the starting position. Your foot should get closer to the floor. Do not lean or bend your knees. Repeat __________ times. Complete this exercise __________ times a  day. Single leg stand 1. Without shoes, stand near a railing or in a doorway. You may hold on to the railing or door frame as needed for balance. 2. Squeeze your left / right buttock muscles, then lift up your other foot. ? Do not let your left / right hip push out to the side. ? It is helpful to stand in front of a mirror for this exercise so you can watch your hip. 3. Hold this position for __________ seconds. Repeat __________ times. Complete this exercise __________ times a day. This information is not intended to replace advice given to you by your health care provider. Make sure you discuss any questions you have with your health care provider. Document Revised: 09/29/2018 Document Reviewed: 09/29/2018 Elsevier Patient Education  2020 [employer]  Iliotibial Band Syndrome Rehab Ask your health care provider which exercises are safe for you. Do exercises exactly as told by your health care provider and adjust them as directed. It is normal to feel mild stretching, pulling, tightness, or discomfort as you do these exercises. Stop right away if you feel sudden pain or your pain gets significantly worse. Do not begin these exercises until told by your health care provider. Stretching and range-of-motion exercises These exercises warm up your muscles and joints and improve the movement and flexibility of your hip and pelvis. Quadriceps stretch, prone  1. Lie on your abdomen on a firm surface, such as a bed or padded floor (prone position). 2. Bend your left / right knee and reach back to hold your ankle or pant leg. If you cannot reach your ankle or pant leg, loop a belt around your foot and grab the belt instead. 3. Gently pull your heel toward your buttocks. Your knee should not slide out to the side. You should feel a stretch in the front of your thigh and knee (quadriceps). 4. Hold this position for __________ seconds. Repeat __________ times. Complete this exercise __________ times a  day. Iliotibial band stretch An iliotibial band is a strong band of muscle tissue that runs from the outer side of your hip to the outer side of your thigh and knee. 1. Lie on your side with your left / right leg in the top position. 2. Bend both of your knees and grab your left / right ankle. Stretch out your bottom arm to help you balance. 3. Slowly bring your top knee back so your thigh goes behind your trunk. 4. Slowly lower your top leg toward the floor until you feel a gentle stretch on the outside of your left / right hip and thigh. If you do not feel a stretch and your knee will not fall farther, place the heel of your other foot on top of your knee and pull your knee down toward the floor with your foot. 5. Hold this position for __________ seconds. Repeat __________ times. Complete this exercise   times a day. Strengthening exercises These exercises build strength and endurance in your hip and pelvis. Endurance is the ability to use your muscles for a long time, even after they get tired. Straight leg raises, side-lying This exercise strengthens the muscles that rotate the leg at the hip and move it away from your body (hip abductors). 1. Lie on your side with your left / right leg in the top position. Lie so your head, shoulder, hip, and knee line up. You may bend your bottom knee to help you balance. 2. Roll your hips slightly forward so your hips are stacked directly over each other and your left / right knee is facing forward. 3. Tense the muscles in your outer thigh and lift your top leg 4-6 inches (10-15 cm). 4. Hold this position for __________ seconds. 5. Slowly return to the starting position. Let your muscles relax completely before doing another repetition. Repeat __________ times. Complete this exercise __________ times a day. Leg raises, prone This exercise strengthens the muscles that move the hips (hip extensors). 1. Lie on your abdomen on your bed or a firm  surface. You can put a pillow under your hips if that is more comfortable for your lower back. 2. Bend your left / right knee so your foot is straight up in the air. 3. Squeeze your buttocks muscles and lift your left / right thigh off the bed. Do not let your back arch. 4. Tense your thigh muscle as hard as you can without increasing any knee pain. 5. Hold this position for __________ seconds. 6. Slowly lower your leg to the starting position and allow it to relax completely. Repeat __________ times. Complete this exercise __________ times a day. Hip hike 1. Stand sideways on a bottom step. Stand on your left / right leg with your other foot unsupported next to the step. You can hold on to the railing or wall for balance if needed. 2. Keep your knees straight and your torso square. Then lift your left / right hip up toward the ceiling. 3. Slowly let your left / right hip lower toward the floor, past the starting position. Your foot should get closer to the floor. Do not lean or bend your knees. Repeat __________ times. Complete this exercise __________ times a day. This information is not intended to replace advice given to you by your health care provider. Make sure you discuss any questions you have with your health care provider. Document Revised: 09/25/2018 Document Reviewed: 03/26/2018 Elsevier Patient Education  2020 [place] [employer]ers, 15(4), [id number]. Retrieved March 24, 2018 from http://clinicalkey.com/nursing">  Knee Exercises Ask your health care provider which exercises are safe for you. Do exercises exactly as told by your health care provider and adjust them as directed. It is normal to feel mild stretching, pulling, tightness, or discomfort as you do these exercises. Stop right away if you feel sudden pain or your pain gets worse. Do not begin these exercises until told by your health care provider. Stretching and range-of-motion exercises These  exercises warm up your muscles and joints and improve the movement and flexibility of your knee. These exercises also help to relieve pain and swelling. Knee extension, prone 5. Lie on your abdomen (prone position) on a bed. 6. Place your left / right knee just beyond the edge of the surface so your knee is not on the bed. You can put a towel under your left / right thigh just above your kneecap for  comfort. 7. Relax your leg muscles and allow gravity to straighten your knee (extension). You should feel a stretch behind your left / right knee. 8. Hold this position for __________ seconds. 9. Scoot up so your knee is supported between repetitions. Repeat __________ times. Complete this exercise __________ times a day. Knee flexion, active  6. Lie on your back with both legs straight. If this causes back discomfort, bend your left / right knee so your foot is flat on the floor. 7. Slowly slide your left / right heel back toward your buttocks. Stop when you feel a gentle stretch in the front of your knee or thigh (flexion). 8. Hold this position for __________ seconds. 9. Slowly slide your left / right heel back to the starting position. Repeat __________ times. Complete this exercise __________ times a day. Quadriceps stretch, prone  6. Lie on your abdomen on a firm surface, such as a bed or padded floor. 7. Bend your left / right knee and hold your ankle. If you cannot reach your ankle or pant leg, loop a belt around your foot and grab the belt instead. 8. Gently pull your heel toward your buttocks. Your knee should not slide out to the side. You should feel a stretch in the front of your thigh and knee (quadriceps). 9. Hold this position for __________ seconds. Repeat __________ times. Complete this exercise __________ times a day. Hamstring, supine 7. Lie on your back (supine position). 8. Loop a belt or towel over the ball of your left / right foot. The ball of your foot is on the walking  surface, right under your toes. 9. Straighten your left / right knee and slowly pull on the belt to raise your leg until you feel a gentle stretch behind your knee (hamstring). ? Do not let your knee bend while you do this. ? Keep your other leg flat on the floor. 10. Hold this position for __________ seconds. Repeat __________ times. Complete this exercise __________ times a day. Strengthening exercises These exercises build strength and endurance in your knee. Endurance is the ability to use your muscles for a long time, even after they get tired. Quadriceps, isometric This exercise stretches the muscles in front of your thigh (quadriceps) without moving your knee joint (isometric). 4. Lie on your back with your left / right leg extended and your other knee bent. Put a rolled towel or small pillow under your knee if told by your health care provider. 5. Slowly tense the muscles in the front of your left / right thigh. You should see your kneecap slide up toward your hip or see increased dimpling just above the knee. This motion will push the back of the knee toward the floor. 6. For __________ seconds, hold the muscle as tight as you can without increasing your pain. 7. Relax the muscles slowly and completely. Repeat __________ times. Complete this exercise __________ times a day. Straight leg raises This exercise stretches the muscles in front of your thigh (quadriceps) and the muscles that move your hips (hip flexors). 1. Lie on your back with your left / right leg extended and your other knee bent. 2. Tense the muscles in the front of your left / right thigh. You should see your kneecap slide up or see increased dimpling just above the knee. Your thigh may even shake a bit. 3. Keep these muscles tight as you raise your leg 4-6 inches (10-15 cm) off the floor. Do not let your knee bend.  4. Hold this position for __________ seconds. 5. Keep these muscles tense as you lower your leg. 6. Relax  your muscles slowly and completely after each repetition. Repeat __________ times. Complete this exercise __________ times a day. Hamstring, isometric 1. Lie on your back on a firm surface. 2. Bend your left / right knee about __________ degrees. 3. Dig your left / right heel into the surface as if you are trying to pull it toward your buttocks. Tighten the muscles in the back of your thighs (hamstring) to "dig" as hard as you can without increasing any pain. 4. Hold this position for __________ seconds. 5. Release the tension gradually and allow your muscles to relax completely for __________ seconds after each repetition. Repeat __________ times. Complete this exercise __________ times a day. Hamstring curls If told by your health care provider, do this exercise while wearing ankle weights. Begin with __________ lb weights. Then increase the weight by 1 lb (0.5 kg) increments. Do not wear ankle weights that are more than __________ lb. 1. Lie on your abdomen with your legs straight. 2. Bend your left / right knee as far as you can without feeling pain. Keep your hips flat against the floor. 3. Hold this position for __________ seconds. 4. Slowly lower your leg to the starting position. Repeat __________ times. Complete this exercise __________ times a day. Squats This exercise strengthens the muscles in front of your thigh and knee (quadriceps). 1. Stand in front of a table, with your feet and knees pointing straight ahead. You may rest your hands on the table for balance but not for support. 2. Slowly bend your knees and lower your hips like you are going to sit in a chair. ? Keep your weight over your heels, not over your toes. ? Keep your lower legs upright so they are parallel with the table legs. ? Do not let your hips go lower than your knees. ? Do not bend lower than told by your health care provider. ? If your knee pain increases, do not bend as low. 3. Hold the squat position for  __________ seconds. 4. Slowly push with your legs to return to standing. Do not use your hands to pull yourself to standing. Repeat __________ times. Complete this exercise __________ times a day. Wall slides This exercise strengthens the muscles in front of your thigh and knee (quadriceps). 1. Lean your back against a smooth wall or door, and walk your feet out 18-24 inches (46-61 cm) from it. 2. Place your feet hip-width apart. 3. Slowly slide down the wall or door until your knees bend __________ degrees. Keep your knees over your heels, not over your toes. Keep your knees in line with your hips. 4. Hold this position for __________ seconds. Repeat __________ times. Complete this exercise __________ times a day. Straight leg raises This exercise strengthens the muscles that rotate the leg at the hip and move it away from your body (hip abductors). 1. Lie on your side with your left / right leg in the top position. Lie so your head, shoulder, knee, and hip line up. You may bend your bottom knee to help you keep your balance. 2. Roll your hips slightly forward so your hips are stacked directly over each other and your left / right knee is facing forward. 3. Leading with your heel, lift your top leg 4-6 inches (10-15 cm). You should feel the muscles in your outer hip lifting. ? Do not let your foot drift  forward. ? Do not let your knee roll toward the ceiling. 4. Hold this position for __________ seconds. 5. Slowly return your leg to the starting position. 6. Let your muscles relax completely after each repetition. Repeat __________ times. Complete this exercise __________ times a day. Straight leg raises This exercise stretches the muscles that move your hips away from the front of the pelvis (hip extensors). 1. Lie on your abdomen on a firm surface. You can put a pillow under your hips if that is more comfortable. 2. Tense the muscles in your buttocks and lift your left / right leg about  4-6 inches (10-15 cm). Keep your knee straight as you lift your leg. 3. Hold this position for __________ seconds. 4. Slowly lower your leg to the starting position. 5. Let your leg relax completely after each repetition. Repeat __________ times. Complete this exercise __________ times a day. This information is not intended to replace advice given to you by your health care provider. Make sure you discuss any questions you have with your health care provider. Document Revised: 03/25/2018 Document Reviewed: 03/25/2018 Elsevier Patient Education  2020 [employer].

## 2019-10-22 LAB — COMPLETE METABOLIC PANEL WITH GFR
AG Ratio: 1.6 (calc) (ref 1.0–2.5)
ALT: 11 U/L (ref 6–29)
AST: 13 U/L (ref 10–35)
Albumin: 3.9 g/dL (ref 3.6–5.1)
Alkaline phosphatase (APISO): 83 U/L (ref 37–153)
BUN/Creatinine Ratio: 13 (calc) (ref 6–22)
BUN: 18 mg/dL (ref 7–25)
CO2: 25 mmol/L (ref 20–32)
Calcium: 10.8 mg/dL — ABNORMAL HIGH (ref 8.6–10.4)
Chloride: 109 mmol/L (ref 98–110)
Creat: 1.37 mg/dL — ABNORMAL HIGH (ref 0.60–0.93)
GFR, Est African American: 44 mL/min/{1.73_m2} — ABNORMAL LOW (ref >=60)
GFR, Est Non African American: 38 mL/min/{1.73_m2} — ABNORMAL LOW (ref >=60)
Globulin: 2.4 g/dL (calc) (ref 1.9–3.7)
Glucose, Bld: 96 mg/dL (ref 65–99)
Potassium: 3.8 mmol/L (ref 3.5–5.3)
Sodium: 143 mmol/L (ref 135–146)
Total Bilirubin: 0.4 mg/dL (ref 0.2–1.2)
Total Protein: 6.3 g/dL (ref 6.1–8.1)

## 2019-10-22 LAB — CBC WITH DIFFERENTIAL/PLATELET
Absolute Monocytes: 517 cells/uL (ref 200–950)
Basophils Absolute: 74 cells/uL (ref 0–200)
Basophils Relative: 0.9 %
Eosinophils Absolute: 508 cells/uL — ABNORMAL HIGH (ref 15–500)
Eosinophils Relative: 6.2 %
HCT: 39.2 % (ref 35.0–45.0)
Hemoglobin: 13.1 g/dL (ref 11.7–15.5)
Lymphs Abs: 2107 cells/uL (ref 850–3900)
MCH: 30.3 pg (ref 27.0–33.0)
MCHC: 33.4 g/dL (ref 32.0–36.0)
MCV: 90.7 fL (ref 80.0–100.0)
MPV: 11.9 fL (ref 7.5–12.5)
Monocytes Relative: 6.3 %
Neutro Abs: 4994 cells/uL (ref 1500–7800)
Neutrophils Relative %: 60.9 %
Platelets: 240 10*3/uL (ref 140–400)
RBC: 4.32 10*6/uL (ref 3.80–5.10)
RDW: 13 % (ref 11.0–15.0)
Total Lymphocyte: 25.7 %
WBC: 8.2 10*3/uL (ref 3.8–10.8)

## 2019-10-22 LAB — URINALYSIS, ROUTINE W REFLEX MICROSCOPIC
Bilirubin Urine: NEGATIVE
Glucose, UA: NEGATIVE
Hgb urine dipstick: NEGATIVE
Ketones, ur: NEGATIVE
Leukocytes,Ua: NEGATIVE
Nitrite: NEGATIVE
Protein, ur: NEGATIVE
Specific Gravity, Urine: 1.016 (ref 1.001–1.03)
pH: 5.5 (ref 5.0–8.0)

## 2019-10-22 NOTE — Progress Notes (Signed)
CBC WNL.  UA normal.  Creatinine elevated and GFR low-44.  Please notify the patient and advise her to avoid taking NSAIDs.  Please forward labs to PCP or nephrologist if she has one.   Calcium is elevated-10.8.  Please advise the patient to hold vitamin D supplement.

## 2019-10-27 ENCOUNTER — Other Ambulatory Visit: Payer: Self-pay

## 2019-10-27 ENCOUNTER — Ambulatory Visit: Payer: Self-pay

## 2019-10-27 DIAGNOSIS — N1831 Chronic kidney disease, stage 3a: Secondary | ICD-10-CM

## 2019-10-27 MED ORDER — IPRATROPIUM BROMIDE 0.03 % NA SOLN: 2.0000 | NASAL | 2 refills | Status: AC | PRN

## 2019-10-27 NOTE — Chronic Care Management (AMB) (Signed)
Chronic Care Management    Social Work Follow Up Note  10/27/2019 Name: Amanda Ross MRN: [id number] DOB: 1945-07-23  Amanda Ross is a 74 y.o. year old female who is a primary care patient of [name], MD. The CCM team was consulted for assistance with care coordination.   Review of patient status, including review of consultants reports, other relevant assessments, and collaboration with appropriate care team members and the patient's provider was performed as part of comprehensive patient evaluation and provision of chronic care management services.    SDOH (Social Determinants of Health) assessments performed: No    Outpatient Encounter Medications as of 10/27/2019  Medication Sig  . aspirin 81 MG tablet Take 81 mg by mouth daily.  . baclofen (LIORESAL) 10 MG tablet TAKE 1/2 TABLET BY MOUTH TWICE DAILY AS NEEDED  . Cholecalciferol (VITAMIN D) 1000 UNITS capsule Take 2,000 Units by mouth daily.   . diclofenac Sodium (VOLTAREN) 1 % GEL Apply 2 g to 4 g to affected area up to 4 times daily as needed.  . hydroxychloroquine (PLAQUENIL) 200 MG tablet Take 1 tablet (200 mg total) by mouth every morning.  Marland Kitchen ipratropium (ATROVENT) 0.03 % nasal spray Place 2 sprays into both nostrils as needed.  Marland Kitchen losartan-hydrochlorothiazide (HYZAAR) 50-12.5 MG tablet TAKE 1 TABLET BY MOUTH DAILY  . magnesium gluconate (MAGONATE) 500 MG tablet Take 500 mg by mouth daily.   . metoprolol tartrate (LOPRESSOR) 25 MG tablet TAKE 1/2 TABLET BY MOUTH TWICE DAILY  . mirabegron ER (MYRBETRIQ) 25 MG TB24 tablet Take 1 tablet (25 mg total) by mouth daily.  Marland Kitchen omeprazole (PRILOSEC) 20 MG capsule TAKE 2 CAPSULES(40 MG) BY MOUTH DAILY  . traZODone (DESYREL) 50 MG tablet TAKE 1 TABLET BY MOUTH EVERY NIGHT AT BEDTIME  . [DISCONTINUED] ipratropium (ATROVENT) 0.03 % nasal spray Place 2 sprays into both nostrils every 12 (twelve) hours. (Patient taking differently: Place 2 sprays into both nostrils as needed. )    No facility-administered encounter medications on file as of 10/27/2019.     Goals Addressed            This Visit's Progress     Patient Stated   . "I would like to know why I feel that my food is getting stuck in my esophagus" (pt-stated)       Current Barriers:  Marland Kitchen Knowledge Deficits related to diagnosis and treatment of impaired swallowing  Nurse Case Manager Clinical Goal(s):  Marland Kitchen Over the next 90 days, patient will verbalize understanding of plan for treatment of impaired swallowing  . Over the next 90 days, patient will work with the PCP and or GI Specialist to address needs related to Impaired Swallowing or esophageal narrowing   CCM SW Interventions Completed 10/27/19 . Inbound call received from the patient in response to voice message left by SW . Reviewed progression of patient stated goal . Determined the patient is no longer having an issue with esophagus since Dr. Baird Cancer increased Omeprazole dose . Collaboration with RN Care Manager regarding patient goal progression . Next phone follow up with RN Care Manager planned for 11/18/19   CCM RN CM Interventions:  05/22/19 call completed with patient   . Placed a CCM RN CM outbound follow up call to Amanda Ross  . Evaluation of current treatment plan related to Impaired Swallowing and patient's adherence to plan as established by provider. Nash Dimmer with PCP, Dr. [name] MD via in basket message regarding patient's reported symptoms and  Dr. Baird Cancer would like to schedule an OV with Amanda Ross for 4 PM today for further evaluation of her symptoms - patient was notified and is aware of her appointment time with Dr. Baird Cancer for this afternoon at 4 PM for a face to face visit and is agreeable  . Discussed plans with patient for ongoing care management follow up and provided patient with direct contact information for care management team  Patient Self Care Activities:  . Self administers medications as  prescribed . Attends all scheduled provider appointments . Calls pharmacy for medication refills . Performs ADL's independently . Performs IADL's independently . Calls provider office for new concerns or questions   Please see past updates related to this goal by clicking on the "Past Updates" button in the selected goal          Follow Up Plan: Telephone follow up with RN Care Manager planned for 11/18/19.  Daneen Schick, BSW, CDP Social Worker, Certified Dementia Practitioner [place] [facility] [place] [facility] [phone]  Total time spent performing care coordination and/or care management activities with the patient by phone or face to face = 13 minutes.

## 2019-10-27 NOTE — Patient Instructions (Signed)
Social Worker Visit Information  Goals we discussed today:  Goals Addressed            This Visit's Progress     Patient Stated   . "I would like to know why I feel that my food is getting stuck in my esophagus" (pt-stated)       Current Barriers:  Marland Kitchen Knowledge Deficits related to diagnosis and treatment of impaired swallowing  Nurse Case Manager Clinical Goal(s):  Marland Kitchen Over the next 90 days, patient will verbalize understanding of plan for treatment of impaired swallowing  . Over the next 90 days, patient will work with the PCP and or GI Specialist to address needs related to Impaired Swallowing or esophageal narrowing   CCM SW Interventions Completed 10/27/19 . Inbound call received from the patient in response to voice message left by SW . Reviewed progression of patient stated goal . Determined the patient is no longer having an issue with esophagus since Dr. Baird Cancer increased Omeprazole dose . Collaboration with RN Care Manager regarding patient goal progression . Next phone follow up with RN Care Manager planned for 11/18/19   CCM RN CM Interventions:  05/22/19 call completed with patient   . Placed a CCM RN CM outbound follow up call to Mrs. Mechling  . Evaluation of current treatment plan related to Impaired Swallowing and patient's adherence to plan as established by provider. Nash Dimmer with PCP, Dr. [name] MD via in basket message regarding patient's reported symptoms and Dr. Baird Cancer would like to schedule an OV with Mrs. Salway for 4 PM today for further evaluation of her symptoms - patient was notified and is aware of her appointment time with Dr. Baird Cancer for this afternoon at 4 PM for a face to face visit and is agreeable  . Discussed plans with patient for ongoing care management follow up and provided patient with direct contact information for care management team  Patient Self Care Activities:  . Self administers medications as prescribed . Attends all  scheduled provider appointments . Calls pharmacy for medication refills . Performs ADL's independently . Performs IADL's independently . Calls provider office for new concerns or questions   Please see past updates related to this goal by clicking on the "Past Updates" button in the selected goal          Follow Up Plan: Telephone follow up with RN Care Manager planned for 11/18/19.   Daneen Schick, BSW, CDP Social Worker, Certified Dementia Practitioner [facility] [phone]

## 2019-10-28 ENCOUNTER — Ambulatory Visit: Payer: Medicare Other | Admitting: Orthopaedic Surgery

## 2019-10-31 ENCOUNTER — Other Ambulatory Visit: Payer: Self-pay | Admitting: Internal Medicine

## 2019-10-31 DIAGNOSIS — R252 Cramp and spasm: Secondary | ICD-10-CM

## 2019-11-02 ENCOUNTER — Encounter: Payer: Self-pay | Admitting: *Deleted

## 2019-11-04 ENCOUNTER — Telehealth: Payer: Self-pay

## 2019-11-04 DIAGNOSIS — M3509 Sicca syndrome with other organ involvement: Secondary | ICD-10-CM

## 2019-11-04 MED ORDER — HYDROXYCHLOROQUINE SULFATE 200 MG PO TABS: 200.0000 mg | ORAL_TABLET | Freq: Every morning | ORAL | 0 refills | Status: DC

## 2019-11-04 NOTE — Telephone Encounter (Signed)
Refill request received via fax from [facility] on [facility]. For plaquenil.   Last Visit: 10/21/2019 Next Visit: 03/24/2020 Labs: 10/21/2019 CBC WNL. UA normal. Creatinine elevated and GFR low-44. Calcium is elevated-10.8. Eye exam: 07/09/2019   Okay to refill per Dr. Estanislado Pandy.

## 2019-11-18 ENCOUNTER — Telehealth: Payer: Self-pay

## 2019-11-30 ENCOUNTER — Other Ambulatory Visit: Payer: Self-pay | Admitting: Internal Medicine

## 2019-11-30 DIAGNOSIS — R252 Cramp and spasm: Secondary | ICD-10-CM

## 2019-12-14 ENCOUNTER — Telehealth: Payer: Self-pay

## 2019-12-30 ENCOUNTER — Other Ambulatory Visit: Payer: Self-pay | Admitting: Internal Medicine

## 2019-12-30 DIAGNOSIS — R252 Cramp and spasm: Secondary | ICD-10-CM

## 2020-01-07 ENCOUNTER — Other Ambulatory Visit: Payer: Self-pay | Admitting: Internal Medicine

## 2020-01-29 ENCOUNTER — Other Ambulatory Visit: Payer: Self-pay | Admitting: Internal Medicine

## 2020-01-29 DIAGNOSIS — R252 Cramp and spasm: Secondary | ICD-10-CM

## 2020-02-01 ENCOUNTER — Telehealth: Payer: Medicare Other

## 2020-02-01 ENCOUNTER — Telehealth: Payer: Self-pay

## 2020-02-01 NOTE — Telephone Encounter (Signed)
Refill request Baclofen

## 2020-02-01 NOTE — Telephone Encounter (Signed)
°  Chronic Care Management   Outreach Note  02/01/2020 Name: AMAURIE SCHRECKENGOST MRN: [id number] DOB: 09/03/1945  Referred by: [name], MD Reason for referral : No chief complaint on file.   An unsuccessful telephone outreach was attempted today. The patient was referred to the case management team for assistance with care management and care coordination.   Follow Up Plan: Telephone follow up appointment with care management team member scheduled for: 03/09/20  Barb Merino, RN, BSN, CCM Care Management Coordinator [facility]  Direct Phone: [phone]

## 2020-03-02 ENCOUNTER — Ambulatory Visit (INDEPENDENT_AMBULATORY_CARE_PROVIDER_SITE_OTHER): Payer: Medicare Other | Admitting: Internal Medicine

## 2020-03-02 ENCOUNTER — Other Ambulatory Visit: Payer: Self-pay

## 2020-03-02 ENCOUNTER — Encounter: Payer: Self-pay | Admitting: Internal Medicine

## 2020-03-02 ENCOUNTER — Ambulatory Visit (INDEPENDENT_AMBULATORY_CARE_PROVIDER_SITE_OTHER): Payer: Medicare Other

## 2020-03-02 VITALS — BP 138/78 | HR 71 | Temp 98.2°F | Ht 63.0 in | Wt 223.0 lb

## 2020-03-02 VITALS — BP 138/78 | HR 71 | Temp 98.2°F | Ht 63.0 in | Wt 233.0 lb

## 2020-03-02 DIAGNOSIS — I700 Atherosclerosis of aorta: Secondary | ICD-10-CM | POA: Diagnosis not present

## 2020-03-02 DIAGNOSIS — G894 Chronic pain syndrome: Secondary | ICD-10-CM

## 2020-03-02 DIAGNOSIS — I129 Hypertensive chronic kidney disease with stage 1 through stage 4 chronic kidney disease, or unspecified chronic kidney disease: Secondary | ICD-10-CM

## 2020-03-02 DIAGNOSIS — Z0000 Encounter for general adult medical examination without abnormal findings: Secondary | ICD-10-CM

## 2020-03-02 DIAGNOSIS — N1831 Chronic kidney disease, stage 3a: Secondary | ICD-10-CM

## 2020-03-02 DIAGNOSIS — Z6841 Body Mass Index (BMI) 40.0 and over, adult: Secondary | ICD-10-CM | POA: Diagnosis not present

## 2020-03-02 DIAGNOSIS — Z23 Encounter for immunization: Secondary | ICD-10-CM | POA: Diagnosis not present

## 2020-03-02 MED ORDER — DULOXETINE HCL 20 MG PO CPEP: 20.0000 mg | ORAL_CAPSULE | Freq: Every day | ORAL | 2 refills | Status: DC

## 2020-03-02 MED ORDER — BOOSTRIX 5-2.5-18.5 LF-MCG/0.5 IM SUSP: 0.5000 mL | Freq: Once | INTRAMUSCULAR | 0 refills | Status: AC

## 2020-03-02 MED ORDER — PREVNAR 13 IM SUSP: 0.5000 mL | INTRAMUSCULAR | 0 refills | Status: AC

## 2020-03-02 NOTE — Patient Instructions (Signed)
Amanda Ross , Thank you for taking time to come for your Medicare Wellness Visit. I appreciate your ongoing commitment to your health goals. Please review the following plan we discussed and let me know if I can assist you in the future.   Screening recommendations/referrals: Colonoscopy: completed 04/26/2011 Mammogram: completed 06/04/2019 Bone Density: completed 05/27/2013 Recommended yearly ophthalmology/optometry visit for glaucoma screening and checkup Recommended yearly dental visit for hygiene and checkup  Vaccinations: Influenza vaccine: states will get at pharmacy Pneumococcal vaccine: sent to pharmacy Tdap vaccine: sent to pharmacy Shingles vaccine: discussed   Covid-19: 09/09/2019, 08/17/2019  Advanced directives: Advance directive discussed with you today. Even though you declined this today please call our office should you change your mind and we can give you the proper paperwork for you to fill out.  Conditions/risks identified: none  Next appointment: 04/13/2020 at 2:00 Follow up in one year for your annual wellness visit    Preventive Care 74 Years and Older, Female Preventive care refers to lifestyle choices and visits with your health care provider that can promote health and wellness. What does preventive care include?  A yearly physical exam. This is also called an annual well check.  Dental exams once or twice a year.  Routine eye exams. Ask your health care provider how often you should have your eyes checked.  Personal lifestyle choices, including:  Daily care of your teeth and gums.  Regular physical activity.  Eating a healthy diet.  Avoiding tobacco and drug use.  Limiting alcohol use.  Practicing safe sex.  Taking low-dose aspirin every day.  Taking vitamin and mineral supplements as recommended by your health care provider. What happens during an annual well check? The services and screenings done by your health care provider during your  annual well check will depend on your age, overall health, lifestyle risk factors, and family history of disease. Counseling  Your health care provider may ask you questions about your:  Alcohol use.  Tobacco use.  Drug use.  Emotional well-being.  Home and relationship well-being.  Sexual activity.  Eating habits.  History of falls.  Memory and ability to understand (cognition).  Work and work Statistician.  Reproductive health. Screening  You may have the following tests or measurements:  Height, weight, and BMI.  Blood pressure.  Lipid and cholesterol levels. These may be checked every 5 years, or more frequently if you are over 74 years old.  Skin check.  Lung cancer screening. You may have this screening every year starting at age 35 if you have a 30-pack-year history of smoking and currently smoke or have quit within the past 15 years.  Fecal occult blood test (FOBT) of the stool. You may have this test every year starting at age 74.  Flexible sigmoidoscopy or colonoscopy. You may have a sigmoidoscopy every 5 years or a colonoscopy every 10 years starting at age 74.  Hepatitis C blood test.  Hepatitis B blood test.  Sexually transmitted disease (STD) testing.  Diabetes screening. This is done by checking your blood sugar (glucose) after you have not eaten for a while (fasting). You may have this done every 1-3 years.  Bone density scan. This is done to screen for osteoporosis. You may have this done starting at age 74.  Mammogram. This may be done every 1-2 years. Talk to your health care provider about how often you should have regular mammograms. Talk with your health care provider about your test results, treatment options, and if necessary,  the need for more tests. Vaccines  Your health care provider may recommend certain vaccines, such as:  Influenza vaccine. This is recommended every year.  Tetanus, diphtheria, and acellular pertussis (Tdap, Td)  vaccine. You may need a Td booster every 10 years.  Zoster vaccine. You may need this after age 74.  Pneumococcal 13-valent conjugate (PCV13) vaccine. One dose is recommended after age 74.  Pneumococcal polysaccharide (PPSV23) vaccine. One dose is recommended after age 74. Talk to your health care provider about which screenings and vaccines you need and how often you need them. This information is not intended to replace advice given to you by your health care provider. Make sure you discuss any questions you have with your health care provider. Document Released: 07/01/2015 Document Revised: 02/22/2016 Document Reviewed: 04/05/2015 Elsevier Interactive Patient Education  2017 [place] Prevention in the Home Falls can cause injuries. They can happen to people of all ages. There are many things you can do to make your home safe and to help prevent falls. What can I do on the outside of my home?  Regularly fix the edges of walkways and driveways and fix any cracks.  Remove anything that might make you trip as you walk through a door, such as a raised step or threshold.  Trim any bushes or trees on the path to your home.  Use bright outdoor lighting.  Clear any walking paths of anything that might make someone trip, such as rocks or tools.  Regularly check to see if handrails are loose or broken. Make sure that both sides of any steps have handrails.  Any raised decks and porches should have guardrails on the edges.  Have any leaves, snow, or ice cleared regularly.  Use sand or salt on walking paths during winter.  Clean up any spills in your garage right away. This includes oil or grease spills. What can I do in the bathroom?  Use night lights.  Install grab bars by the toilet and in the tub and shower. Do not use towel bars as grab bars.  Use non-skid mats or decals in the tub or shower.  If you need to sit down in the shower, use a plastic, non-slip  stool.  Keep the floor dry. Clean up any water that spills on the floor as soon as it happens.  Remove soap buildup in the tub or shower regularly.  Attach bath mats securely with double-sided non-slip rug tape.  Do not have throw rugs and other things on the floor that can make you trip. What can I do in the bedroom?  Use night lights.  Make sure that you have a light by your bed that is easy to reach.  Do not use any sheets or blankets that are too big for your bed. They should not hang down onto the floor.  Have a firm chair that has side arms. You can use this for support while you get dressed.  Do not have throw rugs and other things on the floor that can make you trip. What can I do in the kitchen?  Clean up any spills right away.  Avoid walking on wet floors.  Keep items that you use a lot in easy-to-reach places.  If you need to reach something above you, use a strong step stool that has a grab bar.  Keep electrical cords out of the way.  Do not use floor polish or wax that makes floors slippery. If you must use  wax, use non-skid floor wax.  Do not have throw rugs and other things on the floor that can make you trip. What can I do with my stairs?  Do not leave any items on the stairs.  Make sure that there are handrails on both sides of the stairs and use them. Fix handrails that are broken or loose. Make sure that handrails are as long as the stairways.  Check any carpeting to make sure that it is firmly attached to the stairs. Fix any carpet that is loose or worn.  Avoid having throw rugs at the top or bottom of the stairs. If you do have throw rugs, attach them to the floor with carpet tape.  Make sure that you have a light switch at the top of the stairs and the bottom of the stairs. If you do not have them, ask someone to add them for you. What else can I do to help prevent falls?  Wear shoes that:  Do not have high heels.  Have rubber bottoms.  Are  comfortable and fit you well.  Are closed at the toe. Do not wear sandals.  If you use a stepladder:  Make sure that it is fully opened. Do not climb a closed stepladder.  Make sure that both sides of the stepladder are locked into place.  Ask someone to hold it for you, if possible.  Clearly mark and make sure that you can see:  Any grab bars or handrails.  First and last steps.  Where the edge of each step is.  Use tools that help you move around (mobility aids) if they are needed. These include:  Canes.  Walkers.  Scooters.  Crutches.  Turn on the lights when you go into a dark area. Replace any light bulbs as soon as they burn out.  Set up your furniture so you have a clear path. Avoid moving your furniture around.  If any of your floors are uneven, fix them.  If there are any pets around you, be aware of where they are.  Review your medicines with your doctor. Some medicines can make you feel dizzy. This can increase your chance of falling. Ask your doctor what other things that you can do to help prevent falls. This information is not intended to replace advice given to you by your health care provider. Make sure you discuss any questions you have with your health care provider. Document Released: 03/31/2009 Document Revised: 11/10/2015 Document Reviewed: 07/09/2014 Elsevier Interactive Patient Education  2017 [employer].

## 2020-03-02 NOTE — Progress Notes (Signed)
This visit occurred during the SARS-CoV-2 public health emergency.  Safety protocols were in place, including screening questions prior to the visit, additional usage of staff PPE, and extensive cleaning of exam room while observing appropriate contact time as indicated for disinfecting solutions.  Subjective:   Amanda Ross is a 74 y.o. female who presents for Medicare Annual (Subsequent) preventive examination.  Review of Systems    Cardiac Risk Factors include: advanced age (>20men, >42 women);hypertension;dyslipidemia;obesity (BMI >30kg/m2);sedentary lifestyle     Objective:    Today's Vitals   03/02/20 1048 03/02/20 1053  BP: 138/78   Pulse: 71   Temp: 98.2 F (36.8 C)   TempSrc: Oral   SpO2: 96%   Weight: 223 lb (101.2 kg)   Height: 5\' 3"  (1.6 m)   PainSc:  5    Body mass index is 39.5 kg/m.  Advanced Directives 03/02/2020 02/26/2019 01/11/2015  Does Patient Have a Medical Advance Directive? No No No  Would patient like information on creating a medical advance directive? No - Patient declined - No - patient declined information    Current Medications (verified) Outpatient Encounter Medications as of 03/02/2020  Medication Sig  . aspirin 81 MG tablet Take 81 mg by mouth daily.  . baclofen (LIORESAL) 10 MG tablet TAKE 1/2 TABLET BY MOUTH TWICE DAILY AS NEEDED  . Cholecalciferol (VITAMIN D) 1000 UNITS capsule Take 2,000 Units by mouth daily.   . diclofenac Sodium (VOLTAREN) 1 % GEL Apply 2 g to 4 g to affected area up to 4 times daily as needed.  . hydroxychloroquine (PLAQUENIL) 200 MG tablet Take 1 tablet (200 mg total) by mouth every morning.  Marland Kitchen losartan-hydrochlorothiazide (HYZAAR) 50-12.5 MG tablet TAKE 1 TABLET BY MOUTH DAILY  . magnesium gluconate (MAGONATE) 500 MG tablet Take 500 mg by mouth daily.   . metoprolol tartrate (LOPRESSOR) 25 MG tablet TAKE 1/2 TABLET BY MOUTH TWICE DAILY  . omeprazole (PRILOSEC) 20 MG capsule TAKE 2 CAPSULES(40 MG) BY MOUTH DAILY    . oxybutynin (DITROPAN-XL) 10 MG 24 hr tablet Take 10 mg by mouth at bedtime.  . traZODone (DESYREL) 50 MG tablet TAKE 1 TABLET BY MOUTH EVERY NIGHT AT BEDTIME  . ipratropium (ATROVENT) 0.03 % nasal spray Place 2 sprays into both nostrils as needed. (Patient not taking: Reported on 03/02/2020)  . mirabegron ER (MYRBETRIQ) 25 MG TB24 tablet Take 1 tablet (25 mg total) by mouth daily. (Patient not taking: Reported on 03/02/2020)   No facility-administered encounter medications on file as of 03/02/2020.    Allergies (verified) Ivp dye [iodinated diagnostic agents], Iohexol, Penicillins, Percocet [oxycodone-acetaminophen], and Prednisone   History: Past Medical History:  Diagnosis Date  . Asthma   . GERD (gastroesophageal reflux disease)   . H/O bladder infections   . H/O measles   . H/O varicella   . Headache(784.0)    Frequently  . Hypertension   . OSA (obstructive sleep apnea)   . Osteoarthritis   . Yeast infection    Past Surgical History:  Procedure Laterality Date  . BUNIONECTOMY    . CARPAL TUNNEL RELEASE    . dental work  08/2019  . TOTAL SHOULDER ARTHROPLASTY     Family History  Problem Relation Age of Onset  . Asthma Daughter   . Heart disease Mother        CHF  . Deep vein thrombosis Mother   . Rheum arthritis Mother   . Heart disease Brother   . Rheum arthritis Sister  Social History   Socioeconomic History  . Marital status: Married    Spouse name: Not on file  . Number of children: Not on file  . Years of education: Not on file  . Highest education level: Not on file  Occupational History  . 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 Smoking status: Former Smoker    Packs/day: 1.00    Years: 40.00    Pack years: 40.00    Types: Cigarettes    Quit date: 06/18/2005    Years since quitting: 14.7  . Smokeless tobacco: Never Used  Vaping Use  . Vaping Use: Never used  Substance and Sexual Activity  . Alcohol use: Not  Currently    Comment: rare  . Drug use: No  . Sexual activity: Not Currently  Other Topics Concern  . Not on file  Social History Narrative  . Not on file   Social Determinants of Health   Financial Resource Strain: Medium Risk  . Difficulty of Paying Living Expenses: Somewhat hard  Food Insecurity: No Food Insecurity  . Worried About Charity fundraiser in the Last Year: Never true  . Ran Out of Food in the Last Year: Never true  Transportation Needs: No Transportation Needs  . Lack of Transportation (Medical): No  . Lack of Transportation (Non-Medical): No  Physical Activity: Inactive  . Days of Exercise per Week: 0 days  . Minutes of Exercise per Session: 0 min  Stress: Stress Concern Present  . Feeling of Stress : Very much  Social Connections:   . Frequency of Communication with Friends and Family: Not on file  . Frequency of Social Gatherings with Friends and Family: Not on file  . Attends Religious Services: Not on file  . Active Member of Clubs or Organizations: Not on file  . Attends Archivist Meetings: Not on file  . Marital Status: Not on file    Tobacco Counseling Counseling given: Not Answered   Clinical Intake:  Pre-visit preparation completed: Yes  Pain : 0-10 Pain Score: 5  Pain Type: Chronic pain Pain Location: Generalized Pain Descriptors / Indicators: Aching Pain Onset: More than a month ago Pain Frequency: Constant Pain Relieving Factors: waist belt and APAP  Pain Relieving Factors: waist belt and APAP  Nutritional Status: BMI > 30  Obese Nutritional Risks: None Diabetes: No  How often do you need to have someone help you when you read instructions, pamphlets, or other written materials from your doctor or pharmacy?: 1 - Never What is the last grade level you completed in school?: 12th grade  Diabetic? no  Interpreter Needed?: No  Information entered by :: NAllen LPN   Activities of Daily Living In your present state of  health, do you have any difficulty performing the following activities: 03/02/2020  Hearing? N  Vision? N  Difficulty concentrating or making decisions? Y  Walking or climbing stairs? Y  Comment sometimes due to knees  Dressing or bathing? N  Doing errands, shopping? N  Preparing Food and eating ? N  Using the Toilet? N  In the past six months, have you accidently leaked urine? Y  Comment incontinent, wears pad  Do you have problems with loss of bowel control? N  Managing your Medications? N  Managing your Finances? N  Housekeeping or managing your Housekeeping? N  Some recent data might be hidden    Patient Care Team: [name], MD as PCP -  General (Internal Medicine) [name], MD (Internal Medicine) Rex Kras, Claudette Stapler, RN as [place], Tillie Rung as Social Worker  Indicate any recent [employer] you may have received from other than [facility] providers in the past year (date may be approximate).     Assessment:   This is a routine wellness examination for Amanda Ross.  Hearing/Vision screen  Hearing Screening   125Hz  250Hz  500Hz  1000Hz  2000Hz  3000Hz  4000Hz  6000Hz  8000Hz   Right ear:           Left ear:           Vision Screening Comments: Regular eye exams, Dr. Herbert Deaner  Dietary issues and exercise activities discussed: Current Exercise Habits: The patient does not participate in regular exercise at present  Goals    .  "I would like to get my neck pain under better control" (pt-stated)      Current Barriers:  . Chronic Disease Management support and education needs related to chronic neck and back pain  . Trochanteric bursitis of both hips . Spinal stenosis of cervical region . DDD (degenerative disc disease), cervical . Sjrogrens   Nurse Case Manager Clinical Goal(s):  Marland Kitchen Over the next 60 days, patient will work with the PCP and Rheumatologist to address needs related to chronic neck and back pain . Over the next 90 days,  patient will experience no ED visit and or IP events secondary to uncontrolled or worsening neck/back pain  CCM RN CM Interventions:  05/20/19 call completed with patient   . Evaluation of current treatment plan related to Chronic Neck Pain  and patient's adherence to plan as established by provider. . Advised patient to keep Dr. Baird Cancer and or her Rheumatologist of new, worsening or severe and persistent pain that is not relieved with her current regimen; discussed this condition is being monitored and followed by Dr. Rosann Auerbach   . Provided education to patient re: benefits of working with a PT to help teach a safe and effective HEP to help with strengthening, improve ROM and help reduce pain/discomfort . Reviewed medications with patient and discussed patient is taking Plaquenil for Sjrogrens associated symptoms . Discussed plans with patient for ongoing care management follow up and provided patient with direct contact information for care management team  Patient Self Care Activities:  . Patient verbalizes understanding of plan to contact the CCM team, Dr. Baird Cancer or Rheumatology for severe, new or worsening persistent neck/back pain not relieved by current regimen  . Self administers medications as prescribed . Attends all scheduled provider appointments . Calls pharmacy for medication refills . Performs ADL's independently . Performs IADL's independently . Calls provider office for new concerns or questions   Initial goal documentation     .  "I would like to have less fatigue" (pt-stated)      Current Barriers:  Marland Kitchen Knowledge Deficits related to diagnosis and treatment of increased fatigue . Chronic Disease Management support and education needs related to fatigue  Nurse Case Manager Clinical Goal(s):  Marland Kitchen Over the next 60 days, patient will work with the CCM team and PCP to address needs related to evaluation and treatment of increased fatigue  CCM RN CM Interventions:  05/20/19  call completed with patient   . Evaluation of current treatment plan related to increased fatigue and patient's adherence to plan as established by provider. . Advised patient to keep the CCM RN and PCP well informed if this condition worsens; patient encouraged to implement daily exercise in her  routine even if for 5-10 minutes per day and to balance her activity with plenty of rest; patient encouraged to perform her daily routines during the most energetic part of her day and to stay well hydrated, eat a well balanced meal and get plenty of sleep/rest . Provided education to patient re: the importance of drinking enough water to help prevent fatigue  . Reviewed medications with patient and discussed potential SE to some of the medications she is prescribed to take can cause and or worsen her fatigue . Collaborated with PCP, Dr. Baird Cancer via in basket message regarding patient's complaints of worsening fatigue and request for Dr. Baird Cancer to make recommendations regarding her abnormal TSH . Discussed plans with patient for ongoing care management follow up and provided patient with direct contact information for care management team  Patient Self Care Activities:  . Self administers medications as prescribed . Attends all scheduled provider appointments . Calls pharmacy for medication refills . Performs ADL's independently . Performs IADL's independently . Calls provider office for new concerns or questions  Initial goal documentation     .  "I would like to know why I feel that my food is getting stuck in my esophagus" (pt-stated)      Current Barriers:  Marland Kitchen Knowledge Deficits related to diagnosis and treatment of impaired swallowing  Nurse Case Manager Clinical Goal(s):  Marland Kitchen Over the next 90 days, patient will verbalize understanding of plan for treatment of impaired swallowing  . Over the next 90 days, patient will work with the PCP and or GI Specialist to address needs related to Impaired  Swallowing or esophageal narrowing   CCM SW Interventions Completed 10/27/19 . Inbound call received from the patient in response to voice message left by SW . Reviewed progression of patient stated goal . Determined the patient is no longer having an issue with esophagus since Dr. Baird Cancer increased Omeprazole dose . Collaboration with RN Care Manager regarding patient goal progression . Next phone follow up with RN Care Manager planned for 11/18/19   CCM RN CM Interventions:  05/22/19 call completed with patient   . Placed a CCM RN CM outbound follow up call to Amanda Ross  . Evaluation of current treatment plan related to Impaired Swallowing and patient's adherence to plan as established by provider. Nash Dimmer with PCP, Dr. [name] MD via in basket message regarding patient's reported symptoms and Dr. Baird Cancer would like to schedule an OV with Amanda Ross for 4 PM today for further evaluation of her symptoms - patient was notified and is aware of her appointment time with Dr. Baird Cancer for this afternoon at 4 PM for a face to face visit and is agreeable  . Discussed plans with patient for ongoing care management follow up and provided patient with direct contact information for care management team  Patient Self Care Activities:  . Self administers medications as prescribed . Attends all scheduled provider appointments . Calls pharmacy for medication refills . Performs ADL's independently . Performs IADL's independently . Calls provider office for new concerns or questions   Please see past updates related to this goal by clicking on the "Past Updates" button in the selected goal      .  "I would like to learn why I am having dizzy spells" (pt-stated)      Current Barriers:  Marland Kitchen Knowledge Deficits related to diagnosis and treatment of Vertigo  Nurse Case Manager Clinical Goal(s):  Marland Kitchen Over the next 60 days, patient will  work with the CCM team and PCP to address needs related to  evaluation and treatment of Vertigo . Over the next 90 days, patient will experience no falls secondary to Vertigo  CCM RN CM Interventions:  05/20/19 call completed with patient   . Evaluation of current treatment plan related to Vertigo and patient's adherence to plan as established by provider. . Advised patient to take her time when changing positions from sitting to standing to allow her BP time to adjust and to avoid bending over is possible . Provided education to patient re: benefits from Vestibular PT pending outcome of medication review with Pharm D . Reviewed medications with patient and discussed indication, dosage and frequency of her prescribed hypertensive medications; discussed indication, dosage and frequency of Trazodone prescribed for insomnia; discussed and reviewed that some antihypertensive medications can cause drowsiness or dizziness; verbal education provided related to potential "hang over" effect that can occur from the Trazodone; discussed patient's dizziness is more severe in the am but that she also experience's intermittent episodes of sudden dizziness w/o warning and at random times of the day . Collaborated with embedded Pharm D Lottie Dawson and PCP via in basket message  regarding patient c/o vertigo and requested Pharmacist outreach to further evaluate patient's medication regimen for potential medication induced association . Discussed plans with patient for ongoing care management follow up and provided patient with direct contact information for care management team  Patient Self Care Activities:  . Self administers medications as prescribed . Attends all scheduled provider appointments . Calls pharmacy for medication refills . Performs ADL's independently . Performs IADL's independently . Calls provider office for new concerns or questions  Initial goal documentation      .  I would like to learn more about Chronic Kidney disease" (pt-stated)       Current Barriers:  . Chronic Disease Management support and education needs related to Chronic Kidney Disease  Nurse Case Manager Clinical Goal(s):  Marland Kitchen Over the next 60 days, patient will verbalize increased knowledge and understanding related to CKD, including how to Self management this condition to help reduce the risk for disease progression . Over the next 90 days, patient will not experience a decline in her renal function as evidence by patient will maintain or lower her serum BUN and Creatinine   CCM RN CM Interventions:  05/20/19 call completed with patient   . Evaluation of current treatment plan related to CKD and patient's adherence to plan as established by provider. . Advised patient to drink plenty of water to help the kidneys remove wastes from the blood in the form of urine; educated patient that water also helps keep your blood vessels open so blood can travel more freely to the kidneys and deliver essential nutrients to the kidneys; discussed that severe dehydration can lead to kidney damage . Provided education to patient re: disease process and potential causes for CKD; educated patient on how to Self manage to help reduce the risk for disease progression; educated patient about her diagnosis of Stage III CKD and what this means in terms of disease progression; target goal for BP 130/80 or <    . Discussed plans with patient for ongoing care management follow up and provided patient with direct contact information for care management team . Advised patient, providing education and rationale, to monitor blood pressure daily and record, calling the CCM team and or PCP for findings outside established parameters.  . Provided patient with printed educational materials  related to CKD, 6 Tips to being Water Wise for Healthy Kidneys; Life's Simple 7  Patient Self Care Activities:  . Self administers medications as prescribed . Attends all scheduled provider appointments . Calls  pharmacy for medication refills . Performs ADL's independently . Performs IADL's independently . Calls provider office for new concerns or questions  Initial goal documentation     .  I would like to manage my chronic conditions (pt-stated)      Current Barriers:  . Polypharmacy; complex patient with multiple comorbidities including asthma, CKD, HTN, hyperparathyroidism, urinary incontinence  . Self-manages medications -- Does not use a pill box or other adherence strategies . New onset dizziness-per MD visit on 05/22/19 "This is a new problem. The current episode started in the past 7 days. The problem occurs intermittently. The problem has been unchanged. Associated symptoms include fatigue. Pertinent negatives include no urinary symptoms, visual change or vomiting. The symptoms are aggravated by standing. She has tried nothing for the symptoms." . Patient admits to being dehydrated, low intake of fluids . Recalls dizziness started near the time of oxybutynin initiation (late summer 2020)--patient held 2 doses of oxybutynin and does feel less dizzy.  She reports her urinary  problems have increased since holding this medication.  Pharmacist Clinical Goal(s):  Marland Kitchen Over the next 90 days, patient will work with PharmD and provider towards optimized medication management of chronic conditions  Interventions: Call completed with patient on 06/10/19 . Comprehensive medication review performed; medication list updated in electronic medical record . Reviewed medications that could potentially cause dizziness.  Oxybutynin was started late summer and patient states this may be what is causing this side effect.  Patient has remained stable on all other medications.  BP within normal limits.  Patient wishes to hold oxybutynin to see if dizziness resolves.  Encouraged patient to reach out to Urology today for future guidance before starting/stopping medication prescribed by urologist.  Dose of oxybutynin can  be decreased to attempt to provide coverage while avoiding side effects.  Patient verbalizes understanding. . Counseled patient to increase fluid intake to prevent dehydration . Counseled patient to avoid and sudden movements.  She must take her time standing/sitting/etc . Continue to take BP regularly at home o Recent BP/orthostatics at PCP office visit:  o   o Management of contributing risk factors, such as diabetes and hypertension, is critical to slowing progression of CKD.  Patient remains on ARB for CKD/HTN.  She reports compliance.  BP controlled/at goal as mentioned above.  o Medications dosed appropriately per renal function. Current GFR ~71mL/min  Patient Self Care Activities:  . Patient will take medications as prescribed . Patient will focus on improved adherence   Please see past updates related to this goal by clicking on the "Past Updates" button in the selected goal      .  Patient Stated      02/25/2019, wants to gain strength    .  Patient Stated      03/02/2020, wants to be able to walk more and be more independent      Depression Screen PHQ 2/9 Scores 03/02/2020 05/18/2019 03/24/2019 02/26/2019 03/19/2018  PHQ - 2 Score 6 0 0 6 2  PHQ- 9 Score 21 - - 12 9    Fall Risk Fall Risk  03/02/2020 05/18/2019 03/24/2019 02/26/2019 03/19/2018  Falls in the past year? 0 0 0 0 No  Comment - - - - -  Risk for fall due to : Impaired  balance/gait;Medication side effect - - Medication side effect -  Follow up Falls evaluation completed;Education provided;Falls prevention discussed - - Falls evaluation completed;Education provided;Falls prevention discussed -    Any stairs in or around the home? Yes  If so, are there any without handrails? No  Home free of loose throw rugs in walkways, pet beds, electrical cords, etc? Yes  Adequate lighting in your home to reduce risk of falls? Yes   ASSISTIVE DEVICES UTILIZED TO PREVENT FALLS:  Life alert? No  Use of a cane, walker or w/c? Yes   Grab bars in the bathroom? No  Shower chair or bench in shower? Yes  Elevated toilet seat or a handicapped toilet? No   TIMED UP AND GO:  Was the test performed? No .   Gait slow and steady without use of assistive device  Cognitive Function:     6CIT Screen 03/02/2020 02/26/2019  What Year? 0 points 0 points  What month? 0 points 0 points  What time? 0 points 0 points  Count back from 20 0 points 0 points  Months in reverse 4 points 0 points  Repeat phrase 4 points 0 points  Total Score 8 0    Immunizations Immunization History  Administered Date(s) Administered  . Influenza Split 03/25/2013  . Influenza, High Dose Seasonal PF 03/19/2018, 02/26/2019  . PFIZER SARS-COV-2 Vaccination 08/17/2019, 09/09/2019  . Pneumococcal-Unspecified 04/11/2012    TDAP status: Due, Education has been provided regarding the importance of this vaccine. Advised may receive this vaccine at local pharmacy or Health Dept. Aware to provide a copy of the vaccination record if obtained from local pharmacy or Health Dept. Verbalized acceptance and understanding. Flu Vaccine status: Completed at today's visit Pneumococcal vaccine status: Up to date Covid-19 vaccine status: Completed vaccines  Qualifies for Shingles Vaccine? Yes   Zostavax completed No   Shingrix Completed?: No.    Education has been provided regarding the importance of this vaccine. Patient has been advised to call insurance company to determine out of pocket expense if they have not yet received this vaccine. Advised may also receive vaccine at local pharmacy or Health Dept. Verbalized acceptance and understanding.  Screening Tests Health Maintenance  Topic Date Due  . TETANUS/TDAP  Never done  . PNA vac Low Risk Adult (2 of 2 - PCV13) 04/11/2013  . INFLUENZA VACCINE  01/17/2020  . COLONOSCOPY  04/25/2021  . MAMMOGRAM  06/03/2021  . DEXA SCAN  Completed  . COVID-19 Vaccine  Completed  . Hepatitis C Screening  Completed     Health Maintenance  Health Maintenance Due  Topic Date Due  . TETANUS/TDAP  Never done  . PNA vac Low Risk Adult (2 of 2 - PCV13) 04/11/2013  . INFLUENZA VACCINE  01/17/2020    Colorectal cancer screening: Completed 2021. Repeat every 5 years Mammogram status: Completed 06/04/2019. Repeat every year Bone Density status: Completed 05/27/2013.   Lung Cancer Screening: (Low Dose CT Chest recommended if Age 76-80 years, 30 pack-year currently smoking OR have quit w/in 15years.) does not qualify.   Lung Cancer Screening Referral: no   Additional Screening:  Hepatitis C Screening: does qualify; Completed 09/29/2012  Vision Screening: Recommended annual ophthalmology exams for early detection of glaucoma and other disorders of the eye. Is the patient up to date with their annual eye exam?  Yes  Who is the provider or what is the name of the office in which the patient attends annual eye exams? Dr. Herbert Deaner If pt is  not established with a provider, would they like to be referred to a provider to establish care? No .   Dental Screening: Recommended annual dental exams for proper oral hygiene  Community Resource Referral / Chronic Care Management: CRR required this visit?  No   CCM required this visit?  No      Plan:     I have personally reviewed and noted the following in the patient's chart:   . Medical and social history . Use of alcohol, tobacco or illicit drugs  . Current medications and supplements . Functional ability and status . Nutritional status . Physical activity . Advanced directives . List of other physicians . Hospitalizations, surgeries, and ER visits in previous 12 months . Vitals . Screenings to include cognitive, depression, and falls . Referrals and appointments  In addition, I have reviewed and discussed with patient certain preventive protocols, quality metrics, and best practice recommendations. A written personalized care plan for preventive  services as well as general preventive health recommendations were provided to patient.     Kellie Simmering, LPN   2/[id number]/[id number]   Nurse Notes:

## 2020-03-02 NOTE — Progress Notes (Signed)
I,Tianna Badgett,acting as a Education administrator for Maximino Greenland, MD.,have documented all relevant documentation on the behalf of Maximino Greenland, MD,as directed by  Maximino Greenland, MD while in the presence of Maximino Greenland, MD.  This visit occurred during the SARS-CoV-2 public health emergency.  Safety protocols were in place, including screening questions prior to the visit, additional usage of staff PPE, and extensive cleaning of exam room while observing appropriate contact time as indicated for disinfecting solutions.  Subjective:     Patient ID: Amanda Ross , female    DOB: 02-08-1946 , 74 y.o.   MRN: [id number]   Chief Complaint  Patient presents with  . Hypertension    HPI  She is here today for BP check. She reports compliance with meds.   Hypertension This is a chronic problem. The current episode started more than 1 year ago. The problem has been gradually improving since onset. The problem is controlled. Pertinent negatives include no blurred vision, chest pain or palpitations. Risk factors for coronary artery disease include obesity, post-menopausal state and sedentary lifestyle. The current treatment provides moderate improvement. Compliance problems include exercise.      Past Medical History:  Diagnosis Date  . Asthma   . GERD (gastroesophageal reflux disease)   . H/O bladder infections   . H/O measles   . H/O varicella   . Headache(784.0)    Frequently  . Hypertension   . OSA (obstructive sleep apnea)   . Osteoarthritis   . Yeast infection      Family History  Problem Relation Age of Onset  . Asthma Daughter   . Heart disease Mother        CHF  . Deep vein thrombosis Mother   . Rheum arthritis Mother   . Heart disease Brother   . Rheum arthritis Sister      Current Outpatient Medications:  .  aspirin 81 MG tablet, Take 81 mg by mouth daily., Disp: , Rfl:  .  baclofen (LIORESAL) 10 MG tablet, TAKE 1/2 TABLET BY MOUTH TWICE DAILY AS NEEDED, Disp: 30  tablet, Rfl: 0 .  Cholecalciferol (VITAMIN D) 1000 UNITS capsule, Take 2,000 Units by mouth daily. , Disp: , Rfl:  .  diclofenac Sodium (VOLTAREN) 1 % GEL, Apply 2 g to 4 g to affected area up to 4 times daily as needed., Disp: 400 g, Rfl: 0 .  DULoxetine (CYMBALTA) 20 MG capsule, Take 1 capsule (20 mg total) by mouth daily., Disp: 30 capsule, Rfl: 2 .  hydroxychloroquine (PLAQUENIL) 200 MG tablet, Take 1 tablet (200 mg total) by mouth every morning., Disp: 90 tablet, Rfl: 0 .  ipratropium (ATROVENT) 0.03 % nasal spray, Place 2 sprays into both nostrils as needed. (Patient not taking: Reported on 03/02/2020), Disp: 30 mL, Rfl: 2 .  losartan-hydrochlorothiazide (HYZAAR) 50-12.5 MG tablet, TAKE 1 TABLET BY MOUTH DAILY, Disp: 90 tablet, Rfl: 1 .  magnesium gluconate (MAGONATE) 500 MG tablet, Take 500 mg by mouth daily. , Disp: , Rfl:  .  metoprolol tartrate (LOPRESSOR) 25 MG tablet, TAKE 1/2 TABLET BY MOUTH TWICE DAILY, Disp: 90 tablet, Rfl: 1 .  mirabegron ER (MYRBETRIQ) 25 MG TB24 tablet, Take 1 tablet (25 mg total) by mouth daily. (Patient not taking: Reported on 03/02/2020), Disp: 1 tablet, Rfl: 0 .  omeprazole (PRILOSEC) 20 MG capsule, TAKE 2 CAPSULES(40 MG) BY MOUTH DAILY, Disp: 90 capsule, Rfl: 1 .  oxybutynin (DITROPAN-XL) 10 MG 24 hr tablet, Take 10 mg by mouth  at bedtime., Disp: , Rfl:  .  traZODone (DESYREL) 50 MG tablet, TAKE 1 TABLET BY MOUTH EVERY NIGHT AT BEDTIME, Disp: 90 tablet, Rfl: 0   Allergies  Allergen Reactions  . Ivp Dye [Iodinated Diagnostic Agents] Hives  . Iohexol   . Penicillins Hives  . Percocet [Oxycodone-Acetaminophen]   . Prednisone Nausea Only     Review of Systems  Constitutional: Negative.   Eyes: Negative for blurred vision.  Respiratory: Negative.   Cardiovascular: Negative.  Negative for chest pain and palpitations.  Gastrointestinal: Negative.   Neurological: Negative.      Today's Vitals   03/02/20 1121  BP: 138/78  Pulse: 71  Temp: 98.2 F (36.8  C)  TempSrc: Oral  Weight: 233 lb (105.7 kg)  Height: _0  (1.6 m)   Body mass index is 41.27 kg/m.   Objective:  Physical Exam Vitals and nursing note reviewed.  Constitutional:      Appearance: Normal appearance. She is obese.  HENT:     Head: Normocephalic and atraumatic.  Cardiovascular:     Rate and Rhythm: Normal rate and regular rhythm.     Heart sounds: Normal heart sounds.  Pulmonary:     Effort: Pulmonary effort is normal.     Breath sounds: Normal breath sounds.  Skin:    General: Skin is warm.  Neurological:     General: No focal deficit present.     Mental Status: She is alert.  Psychiatric:        Mood and Affect: Mood normal.        Behavior: Behavior normal.         Assessment And Plan:     1. Hypertensive nephropathy Comments: Chronic, fair control.  She is encouraged to avoid adding salt to her foods and to incorporate more activity into her daily routine. I will check her renal function today.  - CMP14+EGFR  2. Stage 3a chronic kidney disease Comments: Chronic. I will check renal labs today. Encouraged to stay well hydrated and to keep BP under optimal control.  - CMP14+EGFR - Protein electrophoresis, serum - Parathyroid Hormone, Intact w/Ca - Phosphorus  3. Aortic atherosclerosis ([place]) Comments: I will check lipid panel today. Encouraged to follow heart healthy diet.  She is not yet on statin therapy. I will check lipid panel today.  - Lipid panel  4. Chronic pain syndrome  Chronic. She agrees to consider use of duloxetine. Possible side effects discussed with patient. Advised to rto in six weeks for re-evaluation.   5. Class 3 severe obesity due to excess calories with serious comorbidity and body mass index (BMI) of 40.0 to 44.9 in adult [facility])  Encouraged to strive for BMI less than 35 to decrease cardiac risk. Importance of regular exercise was discussed with the patient.  Wt Readings from Last 3 Encounters:  03/02/20 233 lb (105.7  kg)  03/02/20 223 lb (101.2 kg)  10/21/19 224 lb (101.6 kg)   6. Immunization due Comments: She prefers to get flu vaccine at pharmacy. Encouraged to notify me when this is done so I can update her chart.       Patient was given opportunity to ask questions. Patient verbalized understanding of the plan and was able to repeat key elements of the plan. All questions were answered to their satisfaction.  Maximino Greenland, MD   I, Maximino Greenland, MD, have reviewed all documentation for this visit. The documentation on 03/14/20 for the exam, diagnosis, procedures, and orders are  all accurate and complete.  THE PATIENT IS ENCOURAGED TO PRACTICE SOCIAL DISTANCING DUE TO THE COVID-19 PANDEMIC.

## 2020-03-02 NOTE — Patient Instructions (Signed)

## 2020-03-04 LAB — PROTEIN ELECTROPHORESIS, SERUM
A/G Ratio: 1.1 (ref 0.7–1.7)
Albumin ELP: 3.3 g/dL (ref 2.9–4.4)
Alpha 1: 0.2 g/dL (ref 0.0–0.4)
Alpha 2: 0.9 g/dL (ref 0.4–1.0)
Beta: 1 g/dL (ref 0.7–1.3)
Gamma Globulin: 0.9 g/dL (ref 0.4–1.8)
Globulin, Total: 2.9 g/dL (ref 2.2–3.9)

## 2020-03-04 LAB — CMP14+EGFR
ALT: 25 IU/L (ref 0–32)
AST: 14 IU/L (ref 0–40)
Albumin/Globulin Ratio: 2 (ref 1.2–2.2)
Albumin: 4.1 g/dL (ref 3.7–4.7)
Alkaline Phosphatase: 90 IU/L (ref 44–121)
BUN/Creatinine Ratio: 15 (ref 12–28)
BUN: 19 mg/dL (ref 8–27)
Bilirubin Total: 0.3 mg/dL (ref 0.0–1.2)
CO2: 27 mmol/L (ref 20–29)
Calcium: 10.9 mg/dL — ABNORMAL HIGH (ref 8.7–10.3)
Chloride: 107 mmol/L — ABNORMAL HIGH (ref 96–106)
Creatinine, Ser: 1.26 mg/dL — ABNORMAL HIGH (ref 0.57–1.00)
GFR calc Af Amer: 48 mL/min/{1.73_m2} — ABNORMAL LOW (ref >59)
GFR calc non Af Amer: 42 mL/min/{1.73_m2} — ABNORMAL LOW (ref >59)
Globulin, Total: 2.1 g/dL (ref 1.5–4.5)
Glucose: 91 mg/dL (ref 65–99)
Potassium: 4.1 mmol/L (ref 3.5–5.2)
Sodium: 144 mmol/L (ref 134–144)
Total Protein: 6.2 g/dL (ref 6.0–8.5)

## 2020-03-04 LAB — PHOSPHORUS: Phosphorus: 2.8 mg/dL — ABNORMAL LOW (ref 3.0–4.3)

## 2020-03-04 LAB — LIPID PANEL
Chol/HDL Ratio: 3 ratio (ref 0.0–4.4)
Cholesterol, Total: 161 mg/dL (ref 100–199)
HDL: 54 mg/dL (ref >39)
LDL Chol Calc (NIH): 84 mg/dL (ref 0–99)
Triglycerides: 131 mg/dL (ref 0–149)
VLDL Cholesterol Cal: 23 mg/dL (ref 5–40)

## 2020-03-04 LAB — PTH, INTACT AND CALCIUM: PTH: 54 pg/mL (ref 15–65)

## 2020-03-07 ENCOUNTER — Other Ambulatory Visit: Payer: Self-pay | Admitting: *Deleted

## 2020-03-07 ENCOUNTER — Telehealth: Payer: Self-pay | Admitting: Rheumatology

## 2020-03-07 DIAGNOSIS — M3509 Sicca syndrome with other organ involvement: Secondary | ICD-10-CM

## 2020-03-07 MED ORDER — HYDROXYCHLOROQUINE SULFATE 200 MG PO TABS: 200.0000 mg | ORAL_TABLET | Freq: Every morning | ORAL | 0 refills | Status: AC

## 2020-03-07 NOTE — Telephone Encounter (Signed)
Refill request received via fax  Last Visit: 10/21/2019 Next Visit: 03/24/2020 Labs: 10/21/2019 CBC WNL, Creatinine elevated and GFR low-44. Calcium is elevated-10.8.  Eye exam: 07/09/19 Normal  Current Dose per office note 10/21/2019: Plaquenil 200 mg 1 tablet by mouth daily DX:  Sjogren's syndrome with other organ involvement   Okay to refill Plaquenil?

## 2020-03-07 NOTE — Telephone Encounter (Signed)
Patient called requesting prescription refill of Hydroxychloroquine to be sent to [facility] at [facility]

## 2020-03-08 ENCOUNTER — Telehealth: Payer: Self-pay

## 2020-03-08 NOTE — Telephone Encounter (Signed)
Prescription sent to the pharmacy on 03/07/2020.

## 2020-03-08 NOTE — Telephone Encounter (Signed)
Left the patient a message to call back for lab results. 

## 2020-03-08 NOTE — Telephone Encounter (Signed)
-----   Message from [name], MD sent at 03/04/2020  5:35 PM EDT ----- Your kidney function is stable. Your chloride is elevated, appears you need to increase your water intake.   Your phosphorus level is low, we can send you a list of high phosphorus foods. Would you like that? All other labs are stable.   Be sure to increase your daily activity .

## 2020-03-09 ENCOUNTER — Telehealth: Payer: Medicare Other

## 2020-03-09 ENCOUNTER — Telehealth: Payer: Self-pay

## 2020-03-09 NOTE — Telephone Encounter (Cosign Needed)
  Chronic Care Management   Outreach Note  03/09/2020 Name: Amanda Ross MRN: [id number] DOB: Nov 04, 1945  Referred by: [name], MD Reason for referral : Chronic Care Management (FU RN CM Call )   An unsuccessful telephone outreach was attempted today. The patient was referred to the case management team for assistance with care management and care coordination.   Follow Up Plan: Telephone follow up appointment with care management team member scheduled for: 04/15/20  Barb Merino, RN, BSN, CCM  Care Management Coordinator [facility]  Direct Phone: [phone]

## 2020-03-10 NOTE — Progress Notes (Deleted)
Office Visit Note  Patient: Amanda Ross             Date of Birth: 06-Mar-1946           MRN: [id number]             PCP: [name], MD Referring: [name], MD Visit Date: 03/24/2020 Occupation: @GUAROCC @  Subjective:  No chief complaint on file.   History of Present Illness: Amanda Ross is a 74 y.o. female ***   Activities of Daily Living:  Patient reports morning stiffness for *** {minute/hour:19697}.   Patient {ACTIONS;DENIES/REPORTS:21021675::"Denies"} nocturnal pain.  Difficulty dressing/grooming: {ACTIONS;DENIES/REPORTS:21021675::"Denies"} Difficulty climbing stairs: {ACTIONS;DENIES/REPORTS:21021675::"Denies"} Difficulty getting out of chair: {ACTIONS;DENIES/REPORTS:21021675::"Denies"} Difficulty using hands for taps, buttons, cutlery, and/or writing: {ACTIONS;DENIES/REPORTS:21021675::"Denies"}  No Rheumatology ROS completed.   PMFS History:  Patient Active Problem List   Diagnosis Date Noted  . Vitamin D deficiency, unspecified 03/05/2018  . Mixed hyperlipidemia 03/05/2018  . 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 Pain in unspecified joint 03/05/2018  . Abnormal glucose 03/05/2018  . Trochanteric bursitis of both hips 07/30/2017  . Sjogren's syndrome ([place]) 05/22/2016  . Sicca syndrome, unspecified ([place]) 05/22/2016  . High risk medication use 05/22/2016  . Primary hyperparathyroidism ([place]) 07/09/2013  . Left thyroid nodule 07/06/2013  . Dyspnea 10/11/2010    Past Medical History:  Diagnosis Date  . Asthma   . GERD (gastroesophageal reflux disease)   . H/O bladder infections   . H/O measles   . H/O varicella   . Headache(784.0)    Frequently  . Hypertension   . OSA (obstructive sleep apnea)   . Osteoarthritis   . Yeast infection     Family History  Problem Relation Age of Onset  . Asthma Daughter   . Heart disease Mother        CHF  . Deep vein thrombosis Mother   . Rheum arthritis Mother   . Heart disease  Brother   . Rheum arthritis Sister    Past Surgical History:  Procedure Laterality Date  . BUNIONECTOMY    . CARPAL TUNNEL RELEASE    . dental work  08/2019  . TOTAL SHOULDER ARTHROPLASTY     Social History   Social History Narrative  . Not on file   Immunization History  Administered Date(s) Administered  . Influenza Split 03/25/2013  . Influenza, High Dose Seasonal PF 03/19/2018, 02/26/2019  . PFIZER SARS-COV-2 Vaccination 08/17/2019, 09/09/2019  . Pneumococcal-Unspecified 04/11/2012     Objective: Vital Signs: There were no vitals taken for this visit.   Physical Exam   Musculoskeletal Exam: ***  CDAI Exam: CDAI Score: -- Patient Global: --; Provider Global: -- Swollen: --; Tender: -- Joint Exam 03/24/2020   No joint exam has been documented for this visit   There is currently no information documented on the homunculus. Go to the Rheumatology activity and complete the homunculus joint exam.  Investigation: No additional findings.  Imaging: No results found.  Recent Labs: Lab Results  Component Value Date   WBC 8.2 10/21/2019   HGB 13.1 10/21/2019   PLT 240 10/21/2019   NA 144 03/02/2020   K 4.1 03/02/2020   CL 107 (H) 03/02/2020   CO2 27 03/02/2020   GLUCOSE 91 03/02/2020   BUN 19 03/02/2020   CREATININE 1.26 (H) 03/02/2020   BILITOT 0.3 03/02/2020   ALKPHOS 90 03/02/2020   AST 14 03/02/2020   ALT 25 03/02/2020   PROT 6.2 03/02/2020   ALBUMIN 4.1 03/02/2020  CALCIUM 10.9 (H) 03/02/2020   GFRAA 48 (L) 03/02/2020    Speciality Comments: PLQ eye exam: 07/09/19 Normal. [facility]. Follow up in 6 months.  Procedures:  No procedures performed Allergies: Ivp dye [iodinated diagnostic agents], Iohexol, Penicillins, Percocet [oxycodone-acetaminophen], and Prednisone   Assessment / Plan:     Visit Diagnoses: No diagnosis found.  Orders: No orders of the defined types were placed in this encounter.  No orders of the defined types  were placed in this encounter.   Face-to-face time spent with patient was *** minutes. Greater than 50% of time was spent in counseling and coordination of care.  Follow-Up Instructions: No follow-ups on file.   Ofilia Neas, PA-C  Note - This record has been created using Dragon software.  Chart creation errors have been sought, but may not always  have been located. Such creation errors do not reflect on  the standard of medical care.

## 2020-03-15 ENCOUNTER — Other Ambulatory Visit: Payer: Self-pay

## 2020-03-19 DIAGNOSIS — Z23 Encounter for immunization: Secondary | ICD-10-CM | POA: Diagnosis not present

## 2020-03-23 ENCOUNTER — Other Ambulatory Visit: Payer: Self-pay

## 2020-03-23 ENCOUNTER — Ambulatory Visit (HOSPITAL_COMMUNITY)
Admission: EM | Admit: 2020-03-23 | Discharge: 2020-03-23 | Disposition: A | Discharge status: Home / Self Care | Payer: Medicare Other | Attending: Family Medicine | Admitting: Family Medicine

## 2020-03-23 ENCOUNTER — Encounter (HOSPITAL_COMMUNITY): Payer: Self-pay | Admitting: *Deleted

## 2020-03-23 ENCOUNTER — Ambulatory Visit (INDEPENDENT_AMBULATORY_CARE_PROVIDER_SITE_OTHER): Payer: Medicare Other

## 2020-03-23 DIAGNOSIS — R0602 Shortness of breath: Secondary | ICD-10-CM

## 2020-03-23 MED ORDER — AZITHROMYCIN 250 MG PO TABS: 250.0000 mg | ORAL_TABLET | Freq: Every day | ORAL | 0 refills | Status: DC

## 2020-03-23 MED ORDER — GUAIFENESIN 100 MG/5ML PO LIQD: 100.0000 mg | ORAL | 0 refills | Status: DC | PRN

## 2020-03-23 MED ORDER — ALBUTEROL SULFATE HFA 108 (90 BASE) MCG/ACT IN AERS: 1.0000 | INHALATION_SPRAY | Freq: Four times a day (QID) | RESPIRATORY_TRACT | 0 refills | Status: AC | PRN

## 2020-03-23 NOTE — ED Triage Notes (Addendum)
Patient in with complaints of SOB and dizziness x 3 days. Patient states that symptoms started after receiving the flu vaccine on Saturday. Patient states that dizziness occurs with movement.Patient has complaints of rib pain anterior and posterior and describes that pain as an ache. Patient also notes swelling to the right side of neck with pain to that side as well. Patient received negative COVID test on Friday of last week.

## 2020-03-23 NOTE — Progress Notes (Signed)
error 

## 2020-03-23 NOTE — Discharge Instructions (Addendum)
X-ray without any infection to include ammonia.  Most likely a bronchitis related. We will go ahead and cover with antibiotics based on symptoms, fever and to ensure this doesn't turn into pneumonia.  Antibiotics as prescribed.  Guaifenesin cough syrup as needed.  Albuterol for cough, wheezing or shortness of breath.

## 2020-03-24 ENCOUNTER — Ambulatory Visit: Payer: Medicare Other

## 2020-03-24 ENCOUNTER — Inpatient Hospital Stay (HOSPITAL_COMMUNITY)
Admission: EM | Admit: 2020-03-24 | Discharge: 2020-04-16 | DRG: 177 | Disposition: A | Discharge status: Home Health | Payer: Medicare Other | Attending: Internal Medicine | Admitting: Internal Medicine

## 2020-03-24 ENCOUNTER — Encounter (HOSPITAL_COMMUNITY): Payer: Self-pay | Admitting: Emergency Medicine

## 2020-03-24 ENCOUNTER — Encounter (HOSPITAL_COMMUNITY): Payer: Medicare Other

## 2020-03-24 ENCOUNTER — Emergency Department (HOSPITAL_COMMUNITY): Payer: Medicare Other

## 2020-03-24 ENCOUNTER — Ambulatory Visit: Payer: Medicare Other | Admitting: Rheumatology

## 2020-03-24 VITALS — BP 118/68 | HR 85 | Temp 99.2°F

## 2020-03-24 DIAGNOSIS — Z87891 Personal history of nicotine dependence: Secondary | ICD-10-CM | POA: Diagnosis not present

## 2020-03-24 DIAGNOSIS — D696 Thrombocytopenia, unspecified: Secondary | ICD-10-CM | POA: Diagnosis not present

## 2020-03-24 DIAGNOSIS — Z885 Allergy status to narcotic agent status: Secondary | ICD-10-CM | POA: Diagnosis not present

## 2020-03-24 DIAGNOSIS — J9601 Acute respiratory failure with hypoxia: Secondary | ICD-10-CM | POA: Diagnosis present

## 2020-03-24 DIAGNOSIS — Z880 Allergy status to penicillin: Secondary | ICD-10-CM | POA: Diagnosis not present

## 2020-03-24 DIAGNOSIS — J1282 Pneumonia due to coronavirus disease 2019: Secondary | ICD-10-CM | POA: Diagnosis present

## 2020-03-24 DIAGNOSIS — N179 Acute kidney failure, unspecified: Secondary | ICD-10-CM | POA: Diagnosis not present

## 2020-03-24 DIAGNOSIS — Z79899 Other long term (current) drug therapy: Secondary | ICD-10-CM | POA: Diagnosis not present

## 2020-03-24 DIAGNOSIS — U071 COVID-19: Principal | ICD-10-CM | POA: Diagnosis present

## 2020-03-24 DIAGNOSIS — Z91041 Radiographic dye allergy status: Secondary | ICD-10-CM | POA: Diagnosis not present

## 2020-03-24 DIAGNOSIS — K219 Gastro-esophageal reflux disease without esophagitis: Secondary | ICD-10-CM | POA: Diagnosis present

## 2020-03-24 DIAGNOSIS — Z8261 Family history of arthritis: Secondary | ICD-10-CM

## 2020-03-24 DIAGNOSIS — I517 Cardiomegaly: Secondary | ICD-10-CM | POA: Diagnosis not present

## 2020-03-24 DIAGNOSIS — R778 Other specified abnormalities of plasma proteins: Secondary | ICD-10-CM | POA: Diagnosis not present

## 2020-03-24 DIAGNOSIS — E034 Atrophy of thyroid (acquired): Secondary | ICD-10-CM | POA: Diagnosis not present

## 2020-03-24 DIAGNOSIS — I700 Atherosclerosis of aorta: Secondary | ICD-10-CM | POA: Diagnosis present

## 2020-03-24 DIAGNOSIS — E860 Dehydration: Secondary | ICD-10-CM | POA: Diagnosis present

## 2020-03-24 DIAGNOSIS — G4733 Obstructive sleep apnea (adult) (pediatric): Secondary | ICD-10-CM | POA: Diagnosis present

## 2020-03-24 DIAGNOSIS — J45901 Unspecified asthma with (acute) exacerbation: Secondary | ICD-10-CM | POA: Diagnosis present

## 2020-03-24 DIAGNOSIS — Z6839 Body mass index (BMI) 39.0-39.9, adult: Secondary | ICD-10-CM | POA: Diagnosis not present

## 2020-03-24 DIAGNOSIS — E039 Hypothyroidism, unspecified: Secondary | ICD-10-CM | POA: Diagnosis not present

## 2020-03-24 DIAGNOSIS — I21A1 Myocardial infarction type 2: Secondary | ICD-10-CM | POA: Diagnosis not present

## 2020-03-24 DIAGNOSIS — N1831 Chronic kidney disease, stage 3a: Secondary | ICD-10-CM

## 2020-03-24 DIAGNOSIS — J189 Pneumonia, unspecified organism: Secondary | ICD-10-CM | POA: Diagnosis not present

## 2020-03-24 DIAGNOSIS — Z7982 Long term (current) use of aspirin: Secondary | ICD-10-CM | POA: Diagnosis not present

## 2020-03-24 DIAGNOSIS — N1832 Chronic kidney disease, stage 3b: Secondary | ICD-10-CM | POA: Diagnosis present

## 2020-03-24 DIAGNOSIS — I129 Hypertensive chronic kidney disease with stage 1 through stage 4 chronic kidney disease, or unspecified chronic kidney disease: Secondary | ICD-10-CM | POA: Diagnosis present

## 2020-03-24 DIAGNOSIS — R0602 Shortness of breath: Secondary | ICD-10-CM

## 2020-03-24 DIAGNOSIS — Z8249 Family history of ischemic heart disease and other diseases of the circulatory system: Secondary | ICD-10-CM

## 2020-03-24 DIAGNOSIS — E041 Nontoxic single thyroid nodule: Secondary | ICD-10-CM | POA: Diagnosis present

## 2020-03-24 DIAGNOSIS — Z888 Allergy status to other drugs, medicaments and biological substances status: Secondary | ICD-10-CM

## 2020-03-24 DIAGNOSIS — J9621 Acute and chronic respiratory failure with hypoxia: Secondary | ICD-10-CM | POA: Diagnosis present

## 2020-03-24 DIAGNOSIS — I214 Non-ST elevation (NSTEMI) myocardial infarction: Secondary | ICD-10-CM | POA: Diagnosis not present

## 2020-03-24 DIAGNOSIS — Z1152 Encounter for screening for COVID-19: Secondary | ICD-10-CM

## 2020-03-24 DIAGNOSIS — M1990 Unspecified osteoarthritis, unspecified site: Secondary | ICD-10-CM | POA: Diagnosis present

## 2020-03-24 DIAGNOSIS — Z825 Family history of asthma and other chronic lower respiratory diseases: Secondary | ICD-10-CM | POA: Diagnosis not present

## 2020-03-24 DIAGNOSIS — J9620 Acute and chronic respiratory failure, unspecified whether with hypoxia or hypercapnia: Secondary | ICD-10-CM | POA: Diagnosis not present

## 2020-03-24 DIAGNOSIS — E785 Hyperlipidemia, unspecified: Secondary | ICD-10-CM | POA: Diagnosis present

## 2020-03-24 DIAGNOSIS — Z96619 Presence of unspecified artificial shoulder joint: Secondary | ICD-10-CM | POA: Diagnosis present

## 2020-03-24 DIAGNOSIS — I10 Essential (primary) hypertension: Secondary | ICD-10-CM

## 2020-03-24 DIAGNOSIS — M3500 Sicca syndrome, unspecified: Secondary | ICD-10-CM | POA: Diagnosis present

## 2020-03-24 DIAGNOSIS — R7989 Other specified abnormal findings of blood chemistry: Secondary | ICD-10-CM | POA: Diagnosis present

## 2020-03-24 DIAGNOSIS — F419 Anxiety disorder, unspecified: Secondary | ICD-10-CM | POA: Diagnosis present

## 2020-03-24 DIAGNOSIS — I959 Hypotension, unspecified: Secondary | ICD-10-CM | POA: Diagnosis not present

## 2020-03-24 DIAGNOSIS — N3281 Overactive bladder: Secondary | ICD-10-CM | POA: Diagnosis present

## 2020-03-24 LAB — BASIC METABOLIC PANEL
Anion gap: 13 (ref 5–15)
Anion gap: 15 (ref 5–15)
BUN: 23 mg/dL (ref 8–23)
BUN: 23 mg/dL (ref 8–23)
CO2: 24 mmol/L (ref 22–32)
CO2: 20 mmol/L — ABNORMAL LOW (ref 22–32)
Calcium: 9.2 mg/dL (ref 8.9–10.3)
Calcium: 8.9 mg/dL (ref 8.9–10.3)
Chloride: 102 mmol/L (ref 98–111)
Chloride: 103 mmol/L (ref 98–111)
Creatinine, Ser: 1.71 mg/dL — ABNORMAL HIGH (ref 0.44–1.00)
Creatinine, Ser: 1.61 mg/dL — ABNORMAL HIGH (ref 0.44–1.00)
GFR calc non Af Amer: 29 mL/min — ABNORMAL LOW (ref >60)
GFR calc non Af Amer: 31 mL/min — ABNORMAL LOW (ref >60)
Glucose, Bld: 86 mg/dL (ref 70–99)
Glucose, Bld: 97 mg/dL (ref 70–99)
Potassium: 3.6 mmol/L (ref 3.5–5.1)
Potassium: 4 mmol/L (ref 3.5–5.1)
Sodium: 139 mmol/L (ref 135–145)
Sodium: 138 mmol/L (ref 135–145)

## 2020-03-24 LAB — CBC
HCT: 40.9 % (ref 36.0–46.0)
Hemoglobin: 13.2 g/dL (ref 12.0–15.0)
MCH: 29.1 pg (ref 26.0–34.0)
MCHC: 32.3 g/dL (ref 30.0–36.0)
MCV: 90.3 fL (ref 80.0–100.0)
Platelets: 126 10*3/uL — ABNORMAL LOW (ref 150–400)
RBC: 4.53 MIL/uL (ref 3.87–5.11)
RDW: 13.7 % (ref 11.5–15.5)
WBC: 4 10*3/uL (ref 4.0–10.5)
nRBC: 0 % (ref 0.0–0.2)

## 2020-03-24 LAB — CBG MONITORING, ED
Glucose-Capillary: 81 mg/dL (ref 70–99)
Glucose-Capillary: 85 mg/dL (ref 70–99)

## 2020-03-24 LAB — ABO/RH: ABO/RH(D): B POS

## 2020-03-24 LAB — TRIGLYCERIDES: Triglycerides: 82 mg/dL (ref <150)

## 2020-03-24 LAB — FIBRINOGEN: Fibrinogen: 541 mg/dL — ABNORMAL HIGH (ref 210–475)

## 2020-03-24 LAB — FERRITIN: Ferritin: 496 ng/mL — ABNORMAL HIGH (ref 11–307)

## 2020-03-24 LAB — RESPIRATORY PANEL BY RT PCR (FLU A&B, COVID)
Influenza A by PCR: NEGATIVE
Influenza B by PCR: NEGATIVE
SARS Coronavirus 2 by RT PCR: POSITIVE — AB

## 2020-03-24 LAB — TROPONIN I (HIGH SENSITIVITY)
Troponin I (High Sensitivity): 19 ng/L — ABNORMAL HIGH (ref <18)
Troponin I (High Sensitivity): 20 ng/L — ABNORMAL HIGH (ref <18)

## 2020-03-24 LAB — C-REACTIVE PROTEIN: CRP: 17 mg/dL — ABNORMAL HIGH (ref <1.0)

## 2020-03-24 LAB — LACTATE DEHYDROGENASE: LDH: 222 U/L — ABNORMAL HIGH (ref 98–192)

## 2020-03-24 LAB — D-DIMER, QUANTITATIVE: D-Dimer, Quant: 1.47 ug/mL-FEU — ABNORMAL HIGH (ref 0.00–0.50)

## 2020-03-24 LAB — PROCALCITONIN: Procalcitonin: 0.16 ng/mL

## 2020-03-24 MED ORDER — ASPIRIN EC 81 MG PO TBEC
81.0000 mg | DELAYED_RELEASE_TABLET | Freq: Every day | ORAL | Status: DC
  Administered 2020-03-25 – 2020-04-16 (×23): 81 mg via ORAL
  Filled 2020-03-24 (×23): qty 1

## 2020-03-24 MED ORDER — SODIUM CHLORIDE 0.9 % IV SOLN: INTRAVENOUS | Status: AC

## 2020-03-24 MED ORDER — OXYCODONE HCL 5 MG PO TABS
5.0000 mg | ORAL_TABLET | ORAL | Status: DC | PRN
  Filled 2020-03-24: qty 1

## 2020-03-24 MED ORDER — TRAZODONE HCL 50 MG PO TABS
50.0000 mg | ORAL_TABLET | Freq: Every day | ORAL | Status: DC
  Administered 2020-03-24 – 2020-04-15 (×22): 50 mg via ORAL
  Filled 2020-03-24 (×22): qty 1

## 2020-03-24 MED ORDER — ASCORBIC ACID 500 MG PO TABS
500.0000 mg | ORAL_TABLET | Freq: Every day | ORAL | Status: DC
  Administered 2020-03-25 – 2020-04-16 (×23): 500 mg via ORAL
  Filled 2020-03-24 (×24): qty 1

## 2020-03-24 MED ORDER — VITAMIN D 25 MCG (1000 UNIT) PO TABS
2000.0000 [IU] | ORAL_TABLET | Freq: Every day | ORAL | Status: DC
  Administered 2020-03-25 – 2020-04-16 (×23): 2000 [IU] via ORAL
  Filled 2020-03-24 (×23): qty 2

## 2020-03-24 MED ORDER — IPRATROPIUM-ALBUTEROL 20-100 MCG/ACT IN AERS
1.0000 | INHALATION_SPRAY | Freq: Four times a day (QID) | RESPIRATORY_TRACT | Status: DC
  Administered 2020-03-24 – 2020-04-05 (×47): 1 via RESPIRATORY_TRACT
  Filled 2020-03-24: qty 4

## 2020-03-24 MED ORDER — METOPROLOL TARTRATE 12.5 MG HALF TABLET
12.5000 mg | ORAL_TABLET | Freq: Two times a day (BID) | ORAL | Status: DC
  Administered 2020-03-24 – 2020-03-28 (×9): 12.5 mg via ORAL
  Filled 2020-03-24 (×9): qty 1

## 2020-03-24 MED ORDER — ZOLPIDEM TARTRATE 5 MG PO TABS
5.0000 mg | ORAL_TABLET | Freq: Every evening | ORAL | Status: DC | PRN
  Administered 2020-03-26 – 2020-04-15 (×8): 5 mg via ORAL
  Filled 2020-03-24 (×8): qty 1

## 2020-03-24 MED ORDER — ONDANSETRON HCL 4 MG/2ML IJ SOLN: 4.0000 mg | Freq: Four times a day (QID) | INTRAMUSCULAR | Status: DC | PRN

## 2020-03-24 MED ORDER — POLYETHYLENE GLYCOL 3350 17 G PO PACK
17.0000 g | PACK | Freq: Every day | ORAL | Status: DC | PRN
  Administered 2020-04-11: 17 g via ORAL
  Filled 2020-03-24 (×2): qty 1

## 2020-03-24 MED ORDER — BACLOFEN 10 MG PO TABS
5.0000 mg | ORAL_TABLET | Freq: Two times a day (BID) | ORAL | Status: DC | PRN
  Administered 2020-03-24 – 2020-04-14 (×11): 5 mg via ORAL
  Filled 2020-03-24 (×11): qty 1

## 2020-03-24 MED ORDER — HYDROCORTISONE NA SUCCINATE PF 250 MG IJ SOLR
200.0000 mg | Freq: Once | INTRAMUSCULAR | Status: AC
  Administered 2020-03-24: 200 mg via INTRAVENOUS
  Filled 2020-03-24: qty 200

## 2020-03-24 MED ORDER — PANTOPRAZOLE SODIUM 40 MG PO TBEC
40.0000 mg | DELAYED_RELEASE_TABLET | Freq: Every day | ORAL | Status: DC
  Administered 2020-03-24 – 2020-04-16 (×24): 40 mg via ORAL
  Filled 2020-03-24 (×24): qty 1

## 2020-03-24 MED ORDER — OXYBUTYNIN CHLORIDE ER 10 MG PO TB24
10.0000 mg | ORAL_TABLET | Freq: Every day | ORAL | Status: DC
  Administered 2020-03-24 – 2020-04-15 (×24): 10 mg via ORAL
  Filled 2020-03-24 (×27): qty 1

## 2020-03-24 MED ORDER — DIPHENHYDRAMINE HCL 25 MG PO CAPS
50.0000 mg | ORAL_CAPSULE | Freq: Once | ORAL | Status: AC
  Administered 2020-03-25: 50 mg via ORAL
  Filled 2020-03-24: qty 2

## 2020-03-24 MED ORDER — SODIUM CHLORIDE 0.9 % IV SOLN
200.0000 mg | Freq: Once | INTRAVENOUS | Status: AC
  Administered 2020-03-24: 200 mg via INTRAVENOUS
  Filled 2020-03-24: qty 40

## 2020-03-24 MED ORDER — SODIUM CHLORIDE 0.9 % IV SOLN
100.0000 mg | Freq: Every day | INTRAVENOUS | Status: AC
  Administered 2020-03-25 – 2020-03-28 (×4): 100 mg via INTRAVENOUS
  Filled 2020-03-24 (×4): qty 20

## 2020-03-24 MED ORDER — HYDROCOD POLST-CPM POLST ER 10-8 MG/5ML PO SUER
5.0000 mL | Freq: Two times a day (BID) | ORAL | Status: DC | PRN
  Administered 2020-03-25 – 2020-03-27 (×3): 5 mL via ORAL
  Filled 2020-03-24 (×3): qty 5

## 2020-03-24 MED ORDER — GUAIFENESIN-DM 100-10 MG/5ML PO SYRP
10.0000 mL | ORAL_SOLUTION | ORAL | Status: DC | PRN
  Administered 2020-03-25 – 2020-04-16 (×14): 10 mL via ORAL
  Filled 2020-03-24 (×14): qty 10

## 2020-03-24 MED ORDER — ENOXAPARIN SODIUM 40 MG/0.4ML ~~LOC~~ SOLN
40.0000 mg | SUBCUTANEOUS | Status: DC
  Administered 2020-03-24 – 2020-03-28 (×5): 40 mg via SUBCUTANEOUS
  Filled 2020-03-24 (×5): qty 0.4

## 2020-03-24 MED ORDER — ONDANSETRON HCL 4 MG PO TABS: 4.0000 mg | ORAL_TABLET | Freq: Four times a day (QID) | ORAL | Status: DC | PRN

## 2020-03-24 MED ORDER — ACETAMINOPHEN 500 MG PO TABS
1000.0000 mg | ORAL_TABLET | Freq: Once | ORAL | Status: AC
  Administered 2020-03-24: 1000 mg via ORAL
  Filled 2020-03-24: qty 2

## 2020-03-24 MED ORDER — ZINC SULFATE 220 (50 ZN) MG PO CAPS
220.0000 mg | ORAL_CAPSULE | Freq: Every day | ORAL | Status: DC
  Administered 2020-03-24 – 2020-04-16 (×24): 220 mg via ORAL
  Filled 2020-03-24 (×24): qty 1

## 2020-03-24 MED ORDER — IBUPROFEN 400 MG PO TABS
600.0000 mg | ORAL_TABLET | Freq: Once | ORAL | Status: AC
  Administered 2020-03-24: 600 mg via ORAL
  Filled 2020-03-24: qty 1

## 2020-03-24 MED ORDER — DIPHENHYDRAMINE HCL 50 MG/ML IJ SOLN
50.0000 mg | Freq: Once | INTRAMUSCULAR | Status: AC
  Filled 2020-03-24: qty 1

## 2020-03-24 MED ORDER — BISACODYL 5 MG PO TBEC
5.0000 mg | DELAYED_RELEASE_TABLET | Freq: Every day | ORAL | Status: DC | PRN
  Administered 2020-04-11: 5 mg via ORAL
  Filled 2020-03-24 (×2): qty 1

## 2020-03-24 MED ORDER — METHYLPREDNISOLONE SODIUM SUCC 125 MG IJ SOLR
0.5000 mg/kg | Freq: Two times a day (BID) | INTRAMUSCULAR | Status: DC
  Administered 2020-03-24: 51.25 mg via INTRAVENOUS
  Filled 2020-03-24: qty 2

## 2020-03-24 MED ORDER — SODIUM CHLORIDE 0.9 % IV BOLUS
500.0000 mL | Freq: Once | INTRAVENOUS | Status: AC
  Administered 2020-03-24: 500 mL via INTRAVENOUS

## 2020-03-24 MED ORDER — PREDNISONE 20 MG PO TABS: 50.0000 mg | ORAL_TABLET | Freq: Every day | ORAL | Status: DC

## 2020-03-24 MED ORDER — ACETAMINOPHEN 325 MG PO TABS
650.0000 mg | ORAL_TABLET | Freq: Four times a day (QID) | ORAL | Status: DC | PRN
  Administered 2020-03-25 – 2020-04-16 (×23): 650 mg via ORAL
  Filled 2020-03-24 (×23): qty 2

## 2020-03-24 MED ORDER — MAGNESIUM CITRATE PO SOLN: 1.0000 | Freq: Once | ORAL | Status: DC | PRN

## 2020-03-24 MED ORDER — METHYLPREDNISOLONE SODIUM SUCC 125 MG IJ SOLR
60.0000 mg | Freq: Two times a day (BID) | INTRAMUSCULAR | Status: DC
  Administered 2020-03-25 – 2020-03-28 (×7): 60 mg via INTRAVENOUS
  Filled 2020-03-24 (×7): qty 2

## 2020-03-24 NOTE — H&P (Signed)
History and Physical    Amanda Ross GXQ:[id number] DOB: 1945-06-30 DOA: 03/24/2020  PCP: [name], MD Patient coming from: Home.  Chief Complaint: Body ache  HPI: Amanda Ross is a 74 y.o. female with history of asthma, HTN, OSA, primary hypothyroidism and left thyroid nodule presenting with myalgia.  Patient had flu shot 5 days ago and had intermittent cold and chills and myalgia since then.  She presented to local urgent care and was prescribed albuterol and azithromycin and advised to follow-up with PCP.  She presented to PCP today and tested positive for COVID-19 and desaturated to 85% on room air, and directed to ED.  She reports "fever" to 100.1 F about 2 days ago.  She reports subcostal pain bilaterally.  She describes the pain as sharp and tender to palpation.  She reports "mild" cough with clear phlegm.  She also reports shortness of breath especially with exertion.  She denies hemoptysis, orthopnea, PND or lower extremity edema.  She reports nausea but no emesis or diarrhea.  She denies UTI symptoms.  She denies any focal neuro symptoms.  She is vaccinated against COVID-19 as of April 2021.  She denies smoking cigarettes, drinking alcohol recreational drug use.  She wished to be full code.  Designates her husband as Ambulance person.  In ED, desaturated to 89% with minimal activity.  Slightly tachycardic.  Tachypneic to 26.  COVID-19 PCR positive.  Influenza PCR negative.  D-dimer 1.47.  Troponin 19 >> 20.  Platelet 126. Cr 1.71.  BUN 23.  CXR without significant finding. Received ibuprofen and normal saline bolus and hospitalist service called for admission.   ROS All review of system negative except for pertinent positives and negatives as history of present illness above.  PMH Past Medical History:  Diagnosis Date  . Asthma   . GERD (gastroesophageal reflux disease)   . H/O bladder infections   . H/O measles   . H/O varicella   . Headache(784.0)     Frequently  . Hypertension   . OSA (obstructive sleep apnea)   . Osteoarthritis   . Yeast infection    PSH Past Surgical History:  Procedure Laterality Date  . BUNIONECTOMY    . CARPAL TUNNEL RELEASE    . dental work  08/2019  . TOTAL SHOULDER ARTHROPLASTY     Fam HX Family History  Problem Relation Age of Onset  . Asthma Daughter   . Heart disease Mother        CHF  . Deep vein thrombosis Mother   . Rheum arthritis Mother   . Heart disease Brother   . Rheum arthritis Sister    Social Hx  reports that she quit smoking about 14 years ago. Her smoking use included cigarettes. She has a 40.00 pack-year smoking history. She has never used smokeless tobacco. She reports previous alcohol use. She reports that she does not use drugs.  Allergy Allergies  Allergen Reactions  . Ivp Dye [Iodinated Diagnostic Agents] Hives  . Iohexol   . Penicillins Hives    Did it involve swelling of the face/tongue/throat, SOB, or low BP? N Did it involve sudden or severe rash/hives, skin peeling, or any reaction on the inside of your mouth or nose? Y Did you need to seek medical attention at a hospital or doctor's office? N When did it last happen?Several Years Ago If all above answers are "NO", may proceed with cephalosporin use.   Marland Kitchen Percocet [Oxycodone-Acetaminophen]   . Prednisone Nausea Only  Home Meds Prior to Admission medications   Medication Sig Start Date End Date Taking? Authorizing Provider  albuterol (VENTOLIN HFA) 108 (90 Base) MCG/ACT inhaler Inhale 1-2 puffs into the lungs every 6 (six) hours as needed for wheezing or shortness of breath. 03/23/20  Yes Bast, Traci A, NP  aspirin 81 MG tablet Take 81 mg by mouth daily.   Yes [provider]  azithromycin (ZITHROMAX) 250 MG tablet Take 1 tablet (250 mg total) by mouth daily. Take first 2 tablets together, then 1 every day until finished. 03/23/20  Yes Bast, Traci A, NP  baclofen (LIORESAL) 10 MG tablet TAKE 1/2  TABLET BY MOUTH TWICE DAILY AS NEEDED Patient taking differently: Take 10-15 mg by mouth 2 (two) times daily as needed for muscle spasms.  02/01/20  Yes [name], MD  Cholecalciferol (VITAMIN D) 1000 UNITS capsule Take 2,000 Units by mouth daily.    Yes [provider]  diclofenac Sodium (VOLTAREN) 1 % GEL Apply 2 g to 4 g to affected area up to 4 times daily as needed. Patient taking differently: Apply 2 g topically 4 (four) times daily as needed (pain).  05/18/19  Yes Ofilia Neas, PA-C  hydroxychloroquine (PLAQUENIL) 200 MG tablet Take 1 tablet (200 mg total) by mouth every morning. 03/07/20  Yes Ofilia Neas, PA-C  losartan-hydrochlorothiazide (HYZAAR) 50-12.5 MG tablet TAKE 1 TABLET BY MOUTH DAILY 01/07/20  Yes [name], MD  magnesium gluconate (MAGONATE) 500 MG tablet Take 500 mg by mouth daily.    Yes [provider]  metoprolol tartrate (LOPRESSOR) 25 MG tablet TAKE 1/2 TABLET BY MOUTH TWICE DAILY 01/07/20  Yes [name], MD  omeprazole (PRILOSEC) 20 MG capsule TAKE 2 CAPSULES(40 MG) BY MOUTH DAILY Patient taking differently: Take 40 mg by mouth daily.  09/14/19  Yes [name], MD  oxybutynin (DITROPAN-XL) 10 MG 24 hr tablet Take 10 mg by mouth at bedtime.   Yes [provider]  Potassium 99 MG TABS Take 1 tablet by mouth daily.   Yes [provider]  traZODone (DESYREL) 50 MG tablet TAKE 1 TABLET BY MOUTH EVERY NIGHT AT BEDTIME 10/12/19  Yes Minette Brine, FNP  guaiFENesin (ROBITUSSIN) 100 MG/5ML liquid Take 5-10 mLs (100-200 mg total) by mouth every 4 (four) hours as needed for cough. 03/23/20   Bast, Tressia Miners A, NP  ipratropium (ATROVENT) 0.03 % nasal spray Place 2 sprays into both nostrils as needed. Patient not taking: Reported on 03/02/2020 10/27/19   [name], MD  mirabegron ER (MYRBETRIQ) 25 MG TB24 tablet Take 1 tablet (25 mg total) by mouth daily. Patient not taking: Reported on 03/02/2020 04/03/18   Shelby Mattocks, PA-C    Physical Exam: Vitals:   03/24/20 1315 03/24/20 1430 03/24/20 1516 03/24/20 1518  BP: 122/75   134/86  Pulse: (!) 101 98  97  Resp: 20 19  15   Temp:   98.2 F (36.8 C)   TempSrc:   Oral   SpO2: 92% 96%  95%  Weight:      Height:        GENERAL: No acute distress.  Appears well.  HEENT: MMM.  Vision and hearing grossly intact.  NECK: Supple.  No apparent JVD.  RESP: 94% on 2 L by [place] at rest.  No IWOB.  Fair air movement bilaterally.  Bilateral crackles. CVS:  RRR. Heart sounds normal.  ABD/GI/GU: Bowel sounds present. Soft.  Subcostal tenderness bilaterally. MSK/EXT:  Moves extremities. No apparent deformity or  edema.  No calf tenderness. SKIN: no apparent skin lesion or wound NEURO: Awake, alert and oriented appropriately.  No gross deficit.  PSYCH: Calm. Normal affect.   Personally Reviewed Radiological Exams DG Chest 2 View  Result Date: 03/23/2020 CLINICAL DATA:  Shortness of breath EXAM: CHEST - 2 VIEW COMPARISON:  February 25, 2018 FINDINGS: There is mild scarring in the left mid lung region. There is interstitial thickening in portions of the mid and lower lung regions bilaterally. No edema or airspace opacity. Heart size and pulmonary vascular normal. There is aortic atherosclerosis. Postoperative change noted in right shoulder. There is degenerative change in the thoracic spine. IMPRESSION: Suspect a degree of underlying chronic bronchitis. Slight scarring left mid lung. No edema or airspace opacity. Stable cardiac silhouette. Aortic Atherosclerosis (ICD10-I70.0). Electronically Signed   By: Lowella Grip III M.D.   On: 03/23/2020 12:44   DG Chest Portable 1 View  Result Date: 03/24/2020 CLINICAL DATA:  Body aches and shortness of breath. EXAM: PORTABLE CHEST 1 VIEW COMPARISON:  Single-view of the chest 02/25/2018. PA and lateral chest 03/23/2020. FINDINGS: There is some coarsening of the pulmonary interstitium. No consolidative process, pneumothorax or  effusion. Aortic atherosclerosis. No acute or focal bony abnormality. IMPRESSION: No acute disease. Aortic Atherosclerosis (ICD10-I70.0). Electronically Signed   By: Inge Rise M.D.   On: 03/24/2020 11:54     Personally Reviewed Labs: CBC: Recent Labs  Lab 03/24/20 1039  WBC 4.0  HGB 13.2  HCT 40.9  MCV 90.3  PLT 254*   Basic Metabolic Panel: Recent Labs  Lab 03/24/20 1039  NA 139  K 3.6  CL 102  CO2 24  GLUCOSE 86  BUN 23  CREATININE 1.71*  CALCIUM 9.2   GFR: Estimated Creatinine Clearance: 32.9 mL/min (A) (by C-G formula based on SCr of 1.71 mg/dL (H)). Liver Function Tests: No results for input(s): AST, ALT, ALKPHOS, BILITOT, PROT, ALBUMIN in the last 168 hours. No results for input(s): LIPASE, AMYLASE in the last 168 hours. No results for input(s): AMMONIA in the last 168 hours. Coagulation Profile: No results for input(s): INR, PROTIME in the last 168 hours. Cardiac Enzymes: No results for input(s): CKTOTAL, CKMB, CKMBINDEX, TROPONINI in the last 168 hours. BNP (last 3 results) No results for input(s): PROBNP in the last 8760 hours. HbA1C: No results for input(s): HGBA1C in the last 72 hours. CBG: Recent Labs  Lab 03/24/20 1039 03/24/20 1052  GLUCAP 81 85   Lipid Profile: No results for input(s): CHOL, HDL, LDLCALC, TRIG, CHOLHDL, LDLDIRECT in the last 72 hours. Thyroid Function Tests: No results for input(s): TSH, T4TOTAL, FREET4, T3FREE, THYROIDAB in the last 72 hours. Anemia Panel: No results for input(s): VITAMINB12, FOLATE, FERRITIN, TIBC, IRON, RETICCTPCT in the last 72 hours. Urine analysis:    Component Value Date/Time   COLORURINE YELLOW 10/21/2019 [place] 10/21/2019 1126   LABSPEC 1.016 10/21/2019 1126   PHURINE 5.5 10/21/2019 1126   GLUCOSEU NEGATIVE 10/21/2019 1126   [place] 10/21/2019 1126   BILIRUBINUR negative 04/02/2018 1112   [place] 10/21/2019 1126   PROTEINUR NEGATIVE 10/21/2019 1126    UROBILINOGEN 0.2 04/02/2018 1112   NITRITE NEGATIVE 10/21/2019 1126   LEUKOCYTESUR NEGATIVE 10/21/2019 1126    Sepsis Labs:  None  Personally Reviewed EKG:  12-lead EKG with sinus rhythm and incomplete RBBB  Assessment/Plan Active Problems:   Acute hypoxemic respiratory failure due to COVID-19 ([place]) Vaccinated against COVID-19.  Symptomatic for 5 days.  Desaturated to 85%  at PCP office 89% in ED with exertion requiring supplemental oxygen.  She has bilateral crackles, tachypnea and mild tachycardia.  D-dimer elevated.  CXR without significant finding but difficult in morbidly obese patient. -Check inflammatory markers including CRP and procalcitonin -Remdesivir and Solu-Medrol.  Home Plaquenil on hold. -CTA chest-for better answer and exclude VTE.  Premedicate for contrast allergy -Supportive care with inhalers, mucolytic's, antitussive, vitamin C/zinc/incentive spirometry -OOB/PT/OT -Trend inflammatory markers.  Elevated D-dimer: Likely due to COVID-19 infection but need to rule out VTE. -CTA chest and lower extremity Doppler  AKI on CKD-3B: Could be ATN due to COVID-19 infection.  Also on losartan/HCTZ which could contribute. -Hold nephrotoxic meds -Continue monitoring  OSA -Supplemental oxygen   Asthma exacerbation -Steroid and inhalers as above  Essential hypertension: Normotensive. -Continue home metoprolol -Hold home lisinopril/HCTZ in the setting of AKI.  Overactive bladder -Continue home medications after med rec  Morbid obesity: BMI 39.86. -Encourage lifestyle change to lose weight.  Thrombocytopenia: Platelet 126.  240 in 10/2019 -Continue monitoring   DVT prophylaxis: On subcu Lovenox  Code Status: Full code.  Designating her husband as Ambulance person if she is not able to Family Communication: Attempted to call patient's husband on cell phone and home phone but no answer.  Did not leave voicemail on generic voicemail prompt. Disposition Plan:  Admit to MedSurg Consults called: None Admission status: Inpatient.   Mercy Riding MD [facility]  If 7PM-7AM, please contact night-coverage [email or web address]  03/24/2020, 3:48 PM

## 2020-03-24 NOTE — Progress Notes (Signed)
Pt here for COVID testing  Pt was advised by provider RS to go to the hospital because of low oxygen reading and SOB. Pt husband stated he was going to take her

## 2020-03-24 NOTE — Chronic Care Management (AMB) (Signed)
  Chronic Care Management   Inpatient Admit Review Note  03/24/2020 Name: Amanda Ross MRN: [id number] DOB: 1946/02/24  Amanda Ross is a 74 y.o. year old female who is a primary care patient of [name], MD. Amanda Ross is actively engaged with the embedded care management team in the primary care practice and is being followed by RN Case Manager BSW for assistance with disease management and care coordination needs related to CKD Stage III and Hypertensive Nephropathy.   Amanda Ross is currently admitted to the hospital for evaluation and treatment of COVID 19 Infection.   Plan: CM team will collaborate with [facility] and will follow patient post discharge.    Daneen Schick, BSW, CDP Social Worker, Certified Dementia Practitioner [place] [facility] [place] [facility] [phone]

## 2020-03-24 NOTE — ED Notes (Signed)
Pt changed from 93% to 89% getting out of bed but remained at 92-93% while ambulating. Resp remained at 23.  Pt is currently at 91-94%% on room air; complains ribs hurting when she took deep breaths for ascultation, lungs sounded fine.Marland Kitchen

## 2020-03-24 NOTE — ED Provider Notes (Signed)
[place]    CSN: [id number] Arrival date & time: 03/23/20  1054      History   Chief Complaint Chief Complaint  Patient presents with  . Dizziness  . Shortness of Breath  . Neck Swelling    right side  . Chest Pain    HPI STAISHA Ross is a 74 y.o. female.   Patient is a 74 year old female past medical history of asthma, GERD, hypertension, OSA.  She presents today with complaints of shortness of breath and dizziness x3 days.  States that symptoms started receiving the flu vaccine on Saturday.  Patient states that dizziness occurs with movement.  Having some right rib anterior posterior pain also some swelling to the neck which has resolved.  Patient had negative Covid testing on Friday of this past week.  She is overall just feeling very fatigued.  Fever here of 100.2     Past Medical History:  Diagnosis Date  . Asthma   . GERD (gastroesophageal reflux disease)   . H/O bladder infections   . H/O measles   . H/O varicella   . Headache(784.0)    Frequently  . Hypertension   . OSA (obstructive sleep apnea)   . Osteoarthritis   . Yeast infection     Patient Active Problem List   Diagnosis Date Noted  . Acute hypoxemic respiratory failure due to COVID-19 ([place]) 03/24/2020  . Vitamin D deficiency, unspecified 03/05/2018  . Mixed hyperlipidemia 03/05/2018  . 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 Pain in unspecified joint 03/05/2018  . Abnormal glucose 03/05/2018  . Trochanteric bursitis of both hips 07/30/2017  . Sjogren's syndrome ([place]) 05/22/2016  . Sicca syndrome, unspecified 05/22/2016  . High risk medication use 05/22/2016  . Primary hyperparathyroidism ([place]) 07/09/2013  . Left thyroid nodule 07/06/2013  . Dyspnea 10/11/2010    Past Surgical History:  Procedure Laterality Date  . BUNIONECTOMY    . CARPAL TUNNEL RELEASE    . dental work  08/2019  . TOTAL SHOULDER ARTHROPLASTY      OB History    Gravida  4   Para  4     Term  3   Preterm  1   AB      Living  4     SAB      TAB      Ectopic      Multiple      Live Births  4            Home Medications    Prior to Admission medications   Medication Sig Start Date End Date Taking? Authorizing Provider  aspirin 81 MG tablet Take 81 mg by mouth daily.   Yes [provider]  baclofen (LIORESAL) 10 MG tablet TAKE 1/2 TABLET BY MOUTH TWICE DAILY AS NEEDED Patient taking differently: Take 10-15 mg by mouth 2 (two) times daily as needed for muscle spasms.  02/01/20  Yes [name], MD  Cholecalciferol (VITAMIN D) 1000 UNITS capsule Take 2,000 Units by mouth daily.    Yes [provider]  diclofenac Sodium (VOLTAREN) 1 % GEL Apply 2 g to 4 g to affected area up to 4 times daily as needed. Patient taking differently: Apply 2 g topically 4 (four) times daily as needed (pain).  05/18/19  Yes Ofilia Neas, PA-C  hydroxychloroquine (PLAQUENIL) 200 MG tablet Take 1 tablet (200 mg total) by mouth every morning. 03/07/20  Yes Ofilia Neas, PA-C  losartan-hydrochlorothiazide [facility])  50-12.5 MG tablet TAKE 1 TABLET BY MOUTH DAILY 01/07/20  Yes [name], MD  magnesium gluconate (MAGONATE) 500 MG tablet Take 500 mg by mouth daily.    Yes [provider]  metoprolol tartrate (LOPRESSOR) 25 MG tablet TAKE 1/2 TABLET BY MOUTH TWICE DAILY 01/07/20  Yes [name], MD  omeprazole (PRILOSEC) 20 MG capsule TAKE 2 CAPSULES(40 MG) BY MOUTH DAILY Patient taking differently: Take 40 mg by mouth daily.  09/14/19  Yes [name], MD  oxybutynin (DITROPAN-XL) 10 MG 24 hr tablet Take 10 mg by mouth at bedtime.   Yes [provider]  traZODone (DESYREL) 50 MG tablet TAKE 1 TABLET BY MOUTH EVERY NIGHT AT BEDTIME 10/12/19  Yes Minette Brine, FNP  albuterol (VENTOLIN HFA) 108 (90 Base) MCG/ACT inhaler Inhale 1-2 puffs into the lungs every 6 (six) hours as needed for wheezing or shortness of breath. 03/23/20   Damaree Sargent, Tressia Miners A, NP   azithromycin (ZITHROMAX) 250 MG tablet Take 1 tablet (250 mg total) by mouth daily. Take first 2 tablets together, then 1 every day until finished. 03/23/20   Haydon Dorris, Tressia Miners A, NP  guaiFENesin (ROBITUSSIN) 100 MG/5ML liquid Take 5-10 mLs (100-200 mg total) by mouth every 4 (four) hours as needed for cough. 03/23/20   Magaret Justo, Tressia Miners A, NP  ipratropium (ATROVENT) 0.03 % nasal spray Place 2 sprays into both nostrils as needed. Patient not taking: Reported on 03/02/2020 10/27/19   [name], MD  mirabegron ER (MYRBETRIQ) 25 MG TB24 tablet Take 1 tablet (25 mg total) by mouth daily. Patient not taking: Reported on 03/02/2020 04/03/18   Rodriguez[place], Sunday Spillers, PA-C  Potassium 99 MG TABS Take 1 tablet by mouth daily.    [provider]    Family History Family History  Problem Relation Age of Onset  . Asthma Daughter   . Heart disease Mother        CHF  . Deep vein thrombosis Mother   . Rheum arthritis Mother   . Heart disease Brother   . Rheum arthritis Sister     Social History Social History   Tobacco Use  . Smoking status: Former Smoker    Packs/day: 1.00    Years: 40.00    Pack years: 40.00    Types: Cigarettes    Quit date: 06/18/2005    Years since quitting: 14.7  . Smokeless tobacco: Never Used  Vaping Use  . Vaping Use: Never used  Substance Use Topics  . Alcohol use: Not Currently    Comment: rare  . Drug use: No     Allergies   Ivp dye [iodinated diagnostic agents], Iohexol, Penicillins, Percocet [oxycodone-acetaminophen], and Prednisone   Review of Systems Review of Systems   Physical Exam Triage Vital Signs ED Triage Vitals  Enc Vitals Group     BP 03/23/20 1115 (!) 144/84     Pulse Rate 03/23/20 1115 90     Resp 03/23/20 1115 (!) 24     Temp 03/23/20 1115 100.2 F (37.9 C)     Temp Source 03/23/20 1115 Oral     SpO2 03/23/20 1115 96 %     Weight --      Height --      Head Circumference --      Peak Flow --      Pain Score 03/23/20 1118  6     Pain Loc --      Pain Edu? --      Excl. in [place]? --  No data found.  Updated Vital Signs BP (!) 144/84 (BP Location: Right Arm)   Pulse 90   Temp 100.2 F (37.9 C) (Oral)   Resp (!) 24   SpO2 96%   Visual Acuity Right Eye Distance:   Left Eye Distance:   Bilateral Distance:    Right Eye Near:   Left Eye Near:    Bilateral Near:     Physical Exam Constitutional:      General: She is not in acute distress.    Appearance: She is ill-appearing. She is not toxic-appearing or diaphoretic.  HENT:     Head: Normocephalic and atraumatic.     Nose: Nose normal.     Mouth/Throat:     Pharynx: Oropharynx is clear.  Cardiovascular:     Rate and Rhythm: Normal rate and regular rhythm.  Pulmonary:     Effort: Pulmonary effort is normal.     Comments: Decreased breath sounds in lung bases.  Skin:    General: Skin is warm and dry.      UC Treatments / Results  Labs (all labs ordered are listed, but only abnormal results are displayed) Labs Reviewed - No data to display  EKG   Radiology DG Chest 2 View  Result Date: 03/23/2020 CLINICAL DATA:  Shortness of breath EXAM: CHEST - 2 VIEW COMPARISON:  February 25, 2018 FINDINGS: There is mild scarring in the left mid lung region. There is interstitial thickening in portions of the mid and lower lung regions bilaterally. No edema or airspace opacity. Heart size and pulmonary vascular normal. There is aortic atherosclerosis. Postoperative change noted in right shoulder. There is degenerative change in the thoracic spine. IMPRESSION: Suspect a degree of underlying chronic bronchitis. Slight scarring left mid lung. No edema or airspace opacity. Stable cardiac silhouette. Aortic Atherosclerosis (ICD10-I70.0). Electronically Signed   By: Lowella Grip III M.D.   On: 03/23/2020 12:44   DG Chest Portable 1 View  Result Date: 03/24/2020 CLINICAL DATA:  Body aches and shortness of breath. EXAM: PORTABLE CHEST 1 VIEW COMPARISON:   Single-view of the chest 02/25/2018. PA and lateral chest 03/23/2020. FINDINGS: There is some coarsening of the pulmonary interstitium. No consolidative process, pneumothorax or effusion. Aortic atherosclerosis. No acute or focal bony abnormality. IMPRESSION: No acute disease. Aortic Atherosclerosis (ICD10-I70.0). Electronically Signed   By: Inge Rise M.D.   On: 03/24/2020 11:54    Procedures Procedures (including critical care time)  Medications Ordered in UC Medications - No data to display  Initial Impression / Assessment and Plan / UC Course  I have reviewed the triage vital signs and the nursing notes.  Pertinent labs & imaging results that were available during my care of the patient were reviewed by me and considered in my medical decision making (see chart for details).     SOB Pt with recent negative covid testing and recently received the flu vaccine.  symptoms started after this.  Fever here today and she is ill appearing.  X ray with bronchitis, chronic Could be early pneumonia sats are 96% here today and she is not hypoxic at this time.  VSS, speaking in full sentences.  Will treat with abx at this time.  guaifenesin for cough and albuterol as needed.  Still could be a viral illness to include flu and covid or reaction to the vaccine.  Follow up as needed for continued or worsening symptoms   Final Clinical Impressions(s) / UC Diagnoses   Final diagnoses:  Shortness of breath  Discharge Instructions     X-ray without any infection to include ammonia.  Most likely a bronchitis related. We will go ahead and cover with antibiotics based on symptoms, fever and to ensure this doesn't turn into pneumonia.  Antibiotics as prescribed.  Guaifenesin cough syrup as needed.  Albuterol for cough, wheezing or shortness of breath.     ED Prescriptions    Medication Sig Dispense Auth. Provider   azithromycin (ZITHROMAX) 250 MG tablet Take 1 tablet (250 mg total)  by mouth daily. Take first 2 tablets together, then 1 every day until finished. 6 tablet Shiela Bruns A, NP   guaiFENesin (ROBITUSSIN) 100 MG/5ML liquid Take 5-10 mLs (100-200 mg total) by mouth every 4 (four) hours as needed for cough. 60 mL Courtland Coppa A, NP   albuterol (VENTOLIN HFA) 108 (90 Base) MCG/ACT inhaler Inhale 1-2 puffs into the lungs every 6 (six) hours as needed for wheezing or shortness of breath. 1 each Orvan July, NP     PDMP not reviewed this encounter.   Loura Halt A, NP 03/24/20 1600

## 2020-03-24 NOTE — ED Triage Notes (Signed)
Pt arrives to ED with complaints of generalized body aches, fatigue, and SOB since her flu shot on Friday. Pt stated she recently ate with family member who was COVID+ this Saturday. Pt states went to PCP this morning and O2 saturation was 85%.

## 2020-03-24 NOTE — ED Provider Notes (Signed)
[facility] Provider Note   CSN: [id number] Arrival date & time: 03/24/20  1010     History Chief Complaint  Patient presents with  . Generalized Body Aches  . Shortness of Breath    Amanda Ross is a 74 y.o. female.  Presents to ER with concern for generalized body aches, shortness of breath, chest pain and low oxygen.  She reports that she has been having symptoms ever since Friday, body aches, fatigue, feels more short of breath particularly with exertion.  Also having intermittent central chest pain, worse with coughing, worse with deep breaths.  Moderate.  Chills, no known fevers.  Reports she is vaccinated.  HPI     Past Medical History:  Diagnosis Date  . Asthma   . GERD (gastroesophageal reflux disease)   . H/O bladder infections   . H/O measles   . H/O varicella   . Headache(784.0)    Frequently  . Hypertension   . OSA (obstructive sleep apnea)   . Osteoarthritis   . Yeast infection     Patient Active Problem List   Diagnosis Date Noted  . Acute hypoxemic respiratory failure due to COVID-19 ([place]) 03/24/2020  . Vitamin D deficiency, unspecified 03/05/2018  . Mixed hyperlipidemia 03/05/2018  . 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 Pain in unspecified joint 03/05/2018  . Abnormal glucose 03/05/2018  . Trochanteric bursitis of both hips 07/30/2017  . Sjogren's syndrome ([place]) 05/22/2016  . Sicca syndrome, unspecified 05/22/2016  . High risk medication use 05/22/2016  . Primary hyperparathyroidism ([place]) 07/09/2013  . Left thyroid nodule 07/06/2013  . Dyspnea 10/11/2010    Past Surgical History:  Procedure Laterality Date  . BUNIONECTOMY    . CARPAL TUNNEL RELEASE    . dental work  08/2019  . TOTAL SHOULDER ARTHROPLASTY       OB History    Gravida  4   Para  4   Term  3   Preterm  1   AB      Living  4     SAB      TAB      Ectopic      Multiple      Live Births  4             Family History  Problem Relation Age of Onset  . Asthma Daughter   . Heart disease Mother        CHF  . Deep vein thrombosis Mother   . Rheum arthritis Mother   . Heart disease Brother   . Rheum arthritis Sister     Social History   Tobacco Use  . Smoking status: Former Smoker    Packs/day: 1.00    Years: 40.00    Pack years: 40.00    Types: Cigarettes    Quit date: 06/18/2005    Years since quitting: 14.7  . Smokeless tobacco: Never Used  Vaping Use  . Vaping Use: Never used  Substance Use Topics  . Alcohol use: Not Currently    Comment: rare  . Drug use: No    Home Medications Prior to Admission medications   Medication Sig Start Date End Date Taking? Authorizing Provider  albuterol (VENTOLIN HFA) 108 (90 Base) MCG/ACT inhaler Inhale 1-2 puffs into the lungs every 6 (six) hours as needed for wheezing or shortness of breath. 03/23/20  Yes Bast, Traci A, NP  aspirin 81 MG tablet Take 81 mg by mouth daily.  Yes [provider]  azithromycin (ZITHROMAX) 250 MG tablet Take 1 tablet (250 mg total) by mouth daily. Take first 2 tablets together, then 1 every day until finished. 03/23/20  Yes Bast, Traci A, NP  baclofen (LIORESAL) 10 MG tablet TAKE 1/2 TABLET BY MOUTH TWICE DAILY AS NEEDED Patient taking differently: Take 10-15 mg by mouth 2 (two) times daily as needed for muscle spasms.  02/01/20  Yes [name], MD  Cholecalciferol (VITAMIN D) 1000 UNITS capsule Take 2,000 Units by mouth daily.    Yes [provider]  diclofenac Sodium (VOLTAREN) 1 % GEL Apply 2 g to 4 g to affected area up to 4 times daily as needed. Patient taking differently: Apply 2 g topically 4 (four) times daily as needed (pain).  05/18/19  Yes Ofilia Neas, PA-C  hydroxychloroquine (PLAQUENIL) 200 MG tablet Take 1 tablet (200 mg total) by mouth every morning. 03/07/20  Yes Ofilia Neas, PA-C  losartan-hydrochlorothiazide (HYZAAR) 50-12.5 MG tablet TAKE 1 TABLET BY MOUTH DAILY  01/07/20  Yes [name], MD  magnesium gluconate (MAGONATE) 500 MG tablet Take 500 mg by mouth daily.    Yes [provider]  metoprolol tartrate (LOPRESSOR) 25 MG tablet TAKE 1/2 TABLET BY MOUTH TWICE DAILY 01/07/20  Yes [name], MD  omeprazole (PRILOSEC) 20 MG capsule TAKE 2 CAPSULES(40 MG) BY MOUTH DAILY Patient taking differently: Take 40 mg by mouth daily.  09/14/19  Yes [name], MD  oxybutynin (DITROPAN-XL) 10 MG 24 hr tablet Take 10 mg by mouth at bedtime.   Yes [provider]  Potassium 99 MG TABS Take 1 tablet by mouth daily.   Yes [provider]  traZODone (DESYREL) 50 MG tablet TAKE 1 TABLET BY MOUTH EVERY NIGHT AT BEDTIME 10/12/19  Yes Minette Brine, FNP  guaiFENesin (ROBITUSSIN) 100 MG/5ML liquid Take 5-10 mLs (100-200 mg total) by mouth every 4 (four) hours as needed for cough. 03/23/20   Bast, Tressia Miners A, NP  ipratropium (ATROVENT) 0.03 % nasal spray Place 2 sprays into both nostrils as needed. Patient not taking: Reported on 03/02/2020 10/27/19   [name], MD  mirabegron ER (MYRBETRIQ) 25 MG TB24 tablet Take 1 tablet (25 mg total) by mouth daily. Patient not taking: Reported on 03/02/2020 04/03/18   Rodriguez-Southworth, Sunday Spillers, PA-C    Allergies    Ivp dye [iodinated diagnostic agents], Iohexol, Penicillins, Percocet [oxycodone-acetaminophen], and Prednisone  Review of Systems   Review of Systems  Constitutional: Positive for chills and fatigue. Negative for fever.  HENT: Negative for ear pain and sore throat.   Eyes: Negative for pain and visual disturbance.  Respiratory: Positive for cough and shortness of breath.   Cardiovascular: Positive for chest pain. Negative for palpitations.  Gastrointestinal: Negative for abdominal pain and vomiting.  Genitourinary: Negative for dysuria and hematuria.  Musculoskeletal: Positive for arthralgias and myalgias. Negative for back pain.  Skin: Negative for color change and rash.    Neurological: Negative for seizures and syncope.  All other systems reviewed and are negative.   Physical Exam Updated Vital Signs BP 134/86 (BP Location: Right Arm)   Pulse 97   Temp 98.2 F (36.8 C) (Oral)   Resp 15   Ht 5\' 3"  (1.6 m)   Wt 102.1 kg   SpO2 95%   BMI 39.86 kg/m   Physical Exam Vitals and nursing note reviewed.  Constitutional:      General: She is not in acute distress.    Appearance: She is  well-developed.  HENT:     Head: Normocephalic and atraumatic.  Eyes:     Conjunctiva/sclera: Conjunctivae normal.  Cardiovascular:     Rate and Rhythm: Normal rate and regular rhythm.     Heart sounds: No murmur heard.   Pulmonary:     Effort: Pulmonary effort is normal. No respiratory distress.     Breath sounds: Normal breath sounds.     Comments: Mild tachypnea Abdominal:     Palpations: Abdomen is soft.     Tenderness: There is no abdominal tenderness.  Musculoskeletal:     Cervical back: Neck supple.     Right lower leg: No edema.     Left lower leg: No edema.  Skin:    General: Skin is warm and dry.  Neurological:     General: No focal deficit present.     Mental Status: She is alert.     ED Results / Procedures / Treatments   Labs (all labs ordered are listed, but only abnormal results are displayed) Labs Reviewed  RESPIRATORY PANEL BY RT PCR (FLU A&B, COVID) - Abnormal; Notable for the following components:      Result Value   SARS Coronavirus 2 by RT PCR POSITIVE (*)    All other components within normal limits  BASIC METABOLIC PANEL - Abnormal; Notable for the following components:   Creatinine, Ser 1.71 (*)    GFR calc non Af Amer 29 (*)    All other components within normal limits  CBC - Abnormal; Notable for the following components:   Platelets 126 (*)    All other components within normal limits  D-DIMER, QUANTITATIVE (NOT AT [facility]) - Abnormal; Notable for the following components:   D-Dimer, Quant 1.47 (*)    All other components  within normal limits  TROPONIN I (HIGH SENSITIVITY) - Abnormal; Notable for the following components:   Troponin I (High Sensitivity) 19 (*)    All other components within normal limits  TROPONIN I (HIGH SENSITIVITY) - Abnormal; Notable for the following components:   Troponin I (High Sensitivity) 20 (*)    All other components within normal limits  CULTURE, BLOOD (ROUTINE X 2)  CULTURE, BLOOD (ROUTINE X 2)  URINALYSIS, ROUTINE W REFLEX MICROSCOPIC  PROCALCITONIN  LACTATE DEHYDROGENASE  FERRITIN  TRIGLYCERIDES  FIBRINOGEN  C-REACTIVE PROTEIN  CBG MONITORING, ED  CBG MONITORING, ED  ABO/RH    EKG EKG Interpretation  Date/Time:  Thursday March 24 2020 10:23:07 EDT Ventricular Rate:  100 PR Interval:  178 QRS Duration: 102 QT Interval:  350 QTC Calculation: 451 R Axis:   -57 Text Interpretation: Normal sinus rhythm Incomplete right bundle branch block Left anterior fascicular block Moderate voltage criteria for LVH, may be normal variant ( R in aVL , Cornell product ) Nonspecific ST and T wave abnormality Abnormal ECG Confirmed by Madalyn Rob [phone]) on 03/24/2020 10:47:41 AM   Radiology DG Chest 2 View  Result Date: 03/23/2020 CLINICAL DATA:  Shortness of breath EXAM: CHEST - 2 VIEW COMPARISON:  February 25, 2018 FINDINGS: There is mild scarring in the left mid lung region. There is interstitial thickening in portions of the mid and lower lung regions bilaterally. No edema or airspace opacity. Heart size and pulmonary vascular normal. There is aortic atherosclerosis. Postoperative change noted in right shoulder. There is degenerative change in the thoracic spine. IMPRESSION: Suspect a degree of underlying chronic bronchitis. Slight scarring left mid lung. No edema or airspace opacity. Stable cardiac silhouette. Aortic Atherosclerosis (ICD10-I70.0). Electronically  Signed   By: Lowella Grip III M.D.   On: 03/23/2020 12:44   DG Chest Portable 1 View  Result Date:  03/24/2020 CLINICAL DATA:  Body aches and shortness of breath. EXAM: PORTABLE CHEST 1 VIEW COMPARISON:  Single-view of the chest 02/25/2018. PA and lateral chest 03/23/2020. FINDINGS: There is some coarsening of the pulmonary interstitium. No consolidative process, pneumothorax or effusion. Aortic atherosclerosis. No acute or focal bony abnormality. IMPRESSION: No acute disease. Aortic Atherosclerosis (ICD10-I70.0). Electronically Signed   By: Inge Rise M.D.   On: 03/24/2020 11:54    Procedures Procedures (including critical care time)  Medications Ordered in ED Medications  enoxaparin (LOVENOX) injection 40 mg (has no administration in time range)  Ipratropium-Albuterol (COMBIVENT) respimat 1 puff (has no administration in time range)  guaiFENesin-dextromethorphan (ROBITUSSIN DM) 100-10 MG/5ML syrup 10 mL (has no administration in time range)  chlorpheniramine-HYDROcodone (TUSSIONEX) 10-8 MG/5ML suspension 5 mL (has no administration in time range)  ascorbic acid (VITAMIN C) tablet 500 mg (has no administration in time range)  zinc sulfate capsule 220 mg (220 mg Oral Given 03/24/20 1514)  pantoprazole (PROTONIX) EC tablet 40 mg (40 mg Oral Given 03/24/20 1514)  acetaminophen (TYLENOL) tablet 650 mg (has no administration in time range)  oxyCODONE (Oxy IR/ROXICODONE) immediate release tablet 5 mg (has no administration in time range)  zolpidem (AMBIEN) tablet 5 mg (has no administration in time range)  polyethylene glycol (MIRALAX / GLYCOLAX) packet 17 g (has no administration in time range)  bisacodyl (DULCOLAX) EC tablet 5 mg (has no administration in time range)  magnesium citrate solution 1 Bottle (has no administration in time range)  ondansetron (ZOFRAN) tablet 4 mg (has no administration in time range)    Or  ondansetron (ZOFRAN) injection 4 mg (has no administration in time range)  hydrocortisone sodium succinate (SOLU-CORTEF) injection 200 mg (has no administration in time  range)  diphenhydrAMINE (BENADRYL) capsule 50 mg (has no administration in time range)    Or  diphenhydrAMINE (BENADRYL) injection 50 mg (has no administration in time range)  remdesivir 200 mg in sodium chloride 0.9% 250 mL IVPB (has no administration in time range)    Followed by  remdesivir 100 mg in sodium chloride 0.9 % 100 mL IVPB (has no administration in time range)  aspirin tablet 81 mg (has no administration in time range)  metoprolol tartrate (LOPRESSOR) tablet 12.5 mg (has no administration in time range)  traZODone (DESYREL) tablet 50 mg (has no administration in time range)  oxybutynin (DITROPAN-XL) 24 hr tablet 10 mg (has no administration in time range)  Vitamin D 2,000 Units (has no administration in time range)  ibuprofen (ADVIL) tablet 600 mg (600 mg Oral Given 03/24/20 1201)  sodium chloride 0.9 % bolus 500 mL (500 mLs Intravenous New Bag/Given 03/24/20 1500)  acetaminophen (TYLENOL) tablet 1,000 mg (1,000 mg Oral Given 03/24/20 1501)    ED Course  I have reviewed the triage vital signs and the nursing notes.  Pertinent labs & imaging results that were available during my care of the patient were reviewed by me and considered in my medical decision making (see chart for details).    MDM Rules/Calculators/A&P                          74 year old lady who presented to the emergency room with concern for myalgias, chills, shortness of breath, chest pain and low oxygen.  Reports that she went to her primary doctor's  office and they noted her oxygen level to be low in the 80s.  She was referred to ER for further evaluation.  On physical exam initially in ER she was not hypoxic, not in any distress.  Her Covid test is positive.  Suspect this is etiology for her symptoms and presentation today.  Noted her troponin to be borderline elevated, EKG without acute ischemic changes, repeat troponin stable, doubt ACS.  Her D-dimer was also noted to be mildly elevated, suspect this is  related to her COVID-19, have a relatively low suspicion for acute PE.  Patient reported a history of contrast dye allergy and requested to avoid any contrast if at all possible.  As I have a lower clinical suspicion for this, will defer any additional imaging to the admitting team.    Patient became hypoxic and tachypneic with minimal exertion, started on 2 L nasal cannula.  Suspect this is from Covid.  Given this finding and her borderline AKI, believe patient would benefit from hospital admission for further observation.  Dr. Cyndia Skeeters follow-up with hospital service will admit.  Final Clinical Impression(s) / ED Diagnoses Final diagnoses:  COVID-19  Elevated troponin  Acute respiratory failure with hypoxia [facility])    Rx / DC Orders ED Discharge Orders    None       Lucrezia Starch, MD 03/24/20 [phone]

## 2020-03-24 NOTE — Plan of Care (Signed)

## 2020-03-24 NOTE — ED Notes (Signed)
Per verbal order placed Pt on 2L [place]

## 2020-03-25 ENCOUNTER — Inpatient Hospital Stay (HOSPITAL_COMMUNITY): Payer: Medicare Other

## 2020-03-25 DIAGNOSIS — U071 COVID-19: Secondary | ICD-10-CM | POA: Diagnosis not present

## 2020-03-25 DIAGNOSIS — N179 Acute kidney failure, unspecified: Secondary | ICD-10-CM | POA: Diagnosis not present

## 2020-03-25 DIAGNOSIS — J1282 Pneumonia due to Coronavirus disease 2019: Secondary | ICD-10-CM | POA: Diagnosis not present

## 2020-03-25 DIAGNOSIS — J9601 Acute respiratory failure with hypoxia: Secondary | ICD-10-CM | POA: Diagnosis not present

## 2020-03-25 LAB — COMPREHENSIVE METABOLIC PANEL
ALT: 19 U/L (ref 0–44)
AST: 28 U/L (ref 15–41)
Albumin: 2.8 g/dL — ABNORMAL LOW (ref 3.5–5.0)
Alkaline Phosphatase: 58 U/L (ref 38–126)
Anion gap: 12 (ref 5–15)
BUN: 23 mg/dL (ref 8–23)
CO2: 21 mmol/L — ABNORMAL LOW (ref 22–32)
Calcium: 8.8 mg/dL — ABNORMAL LOW (ref 8.9–10.3)
Chloride: 105 mmol/L (ref 98–111)
Creatinine, Ser: 1.55 mg/dL — ABNORMAL HIGH (ref 0.44–1.00)
GFR calc non Af Amer: 33 mL/min — ABNORMAL LOW (ref >60)
Glucose, Bld: 148 mg/dL — ABNORMAL HIGH (ref 70–99)
Potassium: 4 mmol/L (ref 3.5–5.1)
Sodium: 138 mmol/L (ref 135–145)
Total Bilirubin: 0.6 mg/dL (ref 0.3–1.2)
Total Protein: 6.2 g/dL — ABNORMAL LOW (ref 6.5–8.1)

## 2020-03-25 LAB — CBC WITH DIFFERENTIAL/PLATELET
Abs Immature Granulocytes: 0.01 10*3/uL (ref 0.00–0.07)
Basophils Absolute: 0 10*3/uL (ref 0.0–0.1)
Basophils Relative: 0 %
Eosinophils Absolute: 0 10*3/uL (ref 0.0–0.5)
Eosinophils Relative: 0 %
HCT: 36.5 % (ref 36.0–46.0)
Hemoglobin: 12.1 g/dL (ref 12.0–15.0)
Immature Granulocytes: 0 %
Lymphocytes Relative: 9 %
Lymphs Abs: 0.2 10*3/uL — ABNORMAL LOW (ref 0.7–4.0)
MCH: 29.2 pg (ref 26.0–34.0)
MCHC: 33.2 g/dL (ref 30.0–36.0)
MCV: 88 fL (ref 80.0–100.0)
Monocytes Absolute: 0.1 10*3/uL (ref 0.1–1.0)
Monocytes Relative: 5 %
Neutro Abs: 2.3 10*3/uL (ref 1.7–7.7)
Neutrophils Relative %: 86 %
Platelets: ADEQUATE 10*3/uL (ref 150–400)
RBC: 4.15 MIL/uL (ref 3.87–5.11)
RDW: 13.5 % (ref 11.5–15.5)
WBC: 2.7 10*3/uL — ABNORMAL LOW (ref 4.0–10.5)
nRBC: 0 % (ref 0.0–0.2)

## 2020-03-25 LAB — BLOOD CULTURE ID PANEL (REFLEXED) - BCID2

## 2020-03-25 LAB — C-REACTIVE PROTEIN: CRP: 18.5 mg/dL — ABNORMAL HIGH (ref <1.0)

## 2020-03-25 LAB — FERRITIN: Ferritin: 593 ng/mL — ABNORMAL HIGH (ref 11–307)

## 2020-03-25 LAB — D-DIMER, QUANTITATIVE: D-Dimer, Quant: 1.02 ug/mL-FEU — ABNORMAL HIGH (ref 0.00–0.50)

## 2020-03-25 MED ORDER — HYPROMELLOSE (GONIOSCOPIC) 2.5 % OP SOLN
1.0000 [drp] | Freq: Four times a day (QID) | OPHTHALMIC | Status: DC | PRN
  Administered 2020-03-25: 1 [drp] via OPHTHALMIC
  Filled 2020-03-25: qty 15

## 2020-03-25 MED ORDER — IOHEXOL 350 MG/ML SOLN
100.0000 mL | Freq: Once | INTRAVENOUS | Status: AC | PRN
  Administered 2020-03-25: 100 mL via INTRAVENOUS

## 2020-03-25 NOTE — Progress Notes (Addendum)
PROGRESS NOTE  Amanda Ross EGB:[id number] DOB: 07/28/1945 DOA: 03/24/2020  PCP: [name], MD  Brief History/Interval Summary: 74 y.o. female with history of asthma, HTN, OSA, primary hypothyroidism and left thyroid nodule resented with myalgias, chills, intermittent cough) shortness of breath.  She tested positive for COVID-19 at her PCP office.  She apparently desaturated into the mid 80s and was sent over to the emergency department.  Chest x-ray did not show any significant findings.  CT angiogram did not show PE but did show multifocal patchy airspace opacities right greater than left.    Reason for Visit: Pneumonia due to COVID-19.  Acute respiratory failure with hypoxia.  Consultants: None  Procedures: None  Antibiotics: Anti-infectives (From admission, onward)   Start     Dose/Rate Route Frequency Ordered Stop   03/25/20 1000  remdesivir 100 mg in sodium chloride 0.9 % 100 mL IVPB       "Followed by" Linked Group Details   100 mg 200 mL/hr over 30 Minutes Intravenous Daily 03/24/20 1533 03/29/20 0959   03/24/20 1545  remdesivir 200 mg in sodium chloride 0.9% 250 mL IVPB       "Followed by" Linked Group Details   200 mg 580 mL/hr over 30 Minutes Intravenous Once 03/24/20 1533 03/24/20 1940      Subjective/Interval History: Patient states that she is feeling slightly better today compared to yesterday.  Denies any chest pain.  Continues to have a cough which is dry for the most part.  Some shortness of breath.  No nausea vomiting.  No diarrhea.     Assessment/Plan:  Acute Hypoxic Resp. Failure/Pneumonia due to COVID-19  Recent Labs  Lab 03/24/20 1152 03/24/20 1936 03/25/20 0300  DDIMER 1.47*  --  1.02*  FERRITIN  --  496* 593*  CRP  --  17.0* 18.5*  ALT  --   --  19  PROCALCITON  --  0.16  --     Objective findings: Fever: Noted to be afebrile Oxygen requirements: Nasal cannula 2 L saturating in the mid 90s.  COVID 19  Therapeutics: Antibacterials: None.  Procalcitonin was 0.16 Remdesivir: Day 2 Steroids: Solu-Medrol Diuretics: None Inhaled Steroids: None currently Actemra/Baricitinib: None currently PUD Prophylaxis: Protonix DVT Prophylaxis:  Lovenox  From a respiratory standpoint patient seems to be stable.  She does not have high oxygen requirements currently.  CT angiogram did not show any PE.  D-dimer 1.02.  CRP 18.5.  No indication for antibacterials at this time.  Continue Remdesivir and steroids.  Lower extremity Doppler study is pending.  She was told about the disease process and was told that she could have worsening oxygen requirements.  She was told about Actemra and baricitinib.  She is agreeable to take these medications with her as needed.  Will hold off for now.  If her oxygen requirements worsen then we may consider starting her on baricitinib.  She denies any history of liver disease, tuberculosis or bowel issues.  Patient encouraged to stay in prone position as much as possible.  Incentive spirometer.  Mobilization.  The treatment plan and use of medications and known side effects were discussed with patient/family. Some of the medications used are based on case reports/anecdotal data.  All other medications being used in the management of COVID-19 based on limited study data.  Complete risks and long-term side effects are unknown, however in the best clinical judgment they seem to be of some benefit.  Patient/family wanted to proceed with treatment options provided.  Based on ACTT-2 and COV-BARRIER trials and other available data, baricitinib is being used under EUA by the FDA. The patient has no ESRD or AKI, known history of TB, severe neutropenia (ANC <500) or lymphopenia (ALC <200), or severe LFT elevations. She is not on DMARDs or probenecid, and are not pregnant. The option to use/refuse baricitinib treatment under FDA authorization (not approval), the significant known and potential  risks and benefits, the extent to which these are unknown, and information regarding all available alternatives were discussed in detail. Specifically the risk of VTE and secondary infections were discussed in detail with the patient . She consents to proceed with treatment if oxygenation worsens.  Acute kidney injury on chronic kidney disease stage IIIb Patient was on ARB/HCTZ which could be responsible.  These medications have been held for now.  Renal function has improved.  Continue to monitor urine output.  Avoid nephrotoxic agents.  Obstructive sleep apnea Continue with oxygen.  History of asthma Steroids and inhalers as above.  Essential hypertension Continue metoprolol.  Holding antihypertensives.  History of overactive bladder Monitor.  Continue oxybutynin.  Questionable thrombocytopenia Noted to be sufficient amount based on today's labs.  Morbid obesity Estimated body mass index is 39.86 kg/m as calculated from the following:   Height as of this encounter: 5\' 3"  (1.6 m).   Weight as of this encounter: 102.1 kg.   DVT Prophylaxis: Lovenox Code Status: Full code Family Communication: Discussed with patient.  Call her husband later today Disposition Plan: Hopefully return home when improved  Status is: Inpatient  Remains inpatient appropriate because:IV treatments appropriate due to intensity of illness or inability to take PO and Inpatient level of care appropriate due to severity of illness   Dispo: The patient is from: Home              Anticipated d/c is to: Home              Anticipated d/c date is: 2 days              Patient currently is not medically stable to d/c.      Medications:  Scheduled:  vitamin C  500 mg Oral Daily   aspirin EC  81 mg Oral Daily   cholecalciferol  2,000 Units Oral Daily   enoxaparin (LOVENOX) injection  40 mg Subcutaneous Q24H   Ipratropium-Albuterol  1 puff Inhalation Q6H   methylPREDNISolone (SOLU-MEDROL) injection   60 mg Intravenous Q12H   metoprolol tartrate  12.5 mg Oral BID   oxybutynin  10 mg Oral QHS   pantoprazole  40 mg Oral Daily   traZODone  50 mg Oral QHS   zinc sulfate  220 mg Oral Daily   Continuous:  sodium chloride 100 mL/hr at 03/25/20 1051   remdesivir 100 mg in NS 100 mL 100 mg (03/25/20 0848)   AST:[id number], baclofen, bisacodyl, chlorpheniramine-HYDROcodone, guaiFENesin-dextromethorphan, magnesium citrate, ondansetron **OR** ondansetron (ZOFRAN) IV, oxyCODONE, polyethylene glycol, zolpidem   Objective:  Vital Signs  Vitals:   03/24/20 1518 03/24/20 1733 03/24/20 2327 03/25/20 0807  BP: 134/86 118/66 123/83 134/73  Pulse: 97 95 91 75  Resp: 15 16 20 19   Temp:  98.5 F (36.9 C) 97.8 F (36.6 C) 98.7 F (37.1 C)  TempSrc:  Oral Oral   SpO2: 95% 98% 97% 100%  Weight:      Height:        Intake/Output Summary (Last 24 hours) at 03/25/2020 1152 Last data filed at 03/25/2020 0320 Gross  per 24 hour  Intake 857.88 ml  Output 0 ml  Net 857.88 ml   Filed Weights   03/24/20 1033  Weight: 102.1 kg    General appearance: Awake alert.  In no distress Resp: Mildly tachypneic at rest.  Crackles bilateral bases.  No wheezing or rhonchi. Cardio: S1-S2 is normal regular.  No S3-S4.  No rubs murmurs or bruit GI: Abdomen is soft.  Nontender nondistended.  Bowel sounds are present normal.  No masses organomegaly Extremities: No edema.  Full range of motion of lower extremities. Neurologic: Alert and oriented x3.  No focal neurological deficits.    Lab Results:  Data Reviewed: I have personally reviewed following labs and imaging studies  CBC: Recent Labs  Lab 03/24/20 1039 03/25/20 0300  WBC 4.0 2.7*  NEUTROABS  --  2.3  HGB 13.2 12.1  HCT 40.9 36.5  MCV 90.3 88.0  PLT 126* PLATELET CLUMPS NOTED ON SMEAR, COUNT APPEARS ADEQUATE    Basic Metabolic Panel: Recent Labs  Lab 03/24/20 1039 03/24/20 1936 03/25/20 0300  NA 139 138 138  K 3.6 4.0 4.0   CL 102 103 105  CO2 24 20* 21*  GLUCOSE 86 97 148*  BUN 23 23 23   CREATININE 1.71* 1.61* 1.55*  CALCIUM 9.2 8.9 8.8*    GFR: Estimated Creatinine Clearance: 36.3 mL/min (A) (by C-G formula based on SCr of 1.55 mg/dL (H)).  Liver Function Tests: Recent Labs  Lab 03/25/20 0300  AST 28  ALT 19  ALKPHOS 58  BILITOT 0.6  PROT 6.2*  ALBUMIN 2.8*     CBG: Recent Labs  Lab 03/24/20 1039 03/24/20 1052  GLUCAP 81 85    Lipid Profile: Recent Labs    03/24/20 1936  TRIG 82     Anemia Panel: Recent Labs    03/24/20 1936 03/25/20 0300  FERRITIN 496* 593*    Recent Results (from the past 240 hour(s))  Respiratory Panel by RT PCR (Flu A&B, Covid) - Nasopharyngeal Swab     Status: Abnormal   Collection Time: 03/24/20 10:46 AM   Specimen: Nasopharyngeal Swab  Result Value Ref Range Status   SARS Coronavirus 2 by RT PCR POSITIVE (A) NEGATIVE Final    Comment: emailed L. Berdik RN 12:45 03/24/20 ([email or web address]) (NOTE) SARS-CoV-2 target nucleic acids are DETECTED.  SARS-CoV-2 RNA is generally detectable in upper respiratory specimens  during the acute phase of infection. Positive results are indicative of the presence of the identified virus, but do not rule out bacterial infection or co-infection with other pathogens not detected by the test. Clinical correlation with patient history and other diagnostic information is necessary to determine patient infection status. The expected result is Negative.  Fact Sheet for Patients:  PinkCheek.be  Fact Sheet for Healthcare Providers: GravelBags.it  This test is not yet approved or cleared by the [place] FDA and  has been authorized for detection and/or diagnosis of SARS-CoV-2 by FDA under an Emergency Use Authorization (EUA).  This EUA will remain in effect (meaning this test can be used) for the duration of  the COVID-19  declaration under Section 564(b)(1) of  the Act, 21 U.S.C. section 360bbb-3(b)(1), unless the authorization is terminated or revoked sooner.      Influenza A by PCR NEGATIVE NEGATIVE Final   Influenza B by PCR NEGATIVE NEGATIVE Final    Comment: (NOTE) The Xpert Xpress SARS-CoV-2/FLU/RSV assay is intended as an aid in  the diagnosis of influenza from Nasopharyngeal swab specimens and  should not  be used as a sole basis for treatment. Nasal washings and  aspirates are unacceptable for Xpert Xpress SARS-CoV-2/FLU/RSV  testing.  Fact Sheet for Patients: PinkCheek.be  Fact Sheet for Healthcare Providers: GravelBags.it  This test is not yet approved or cleared by the [place] FDA and  has been authorized for detection and/or diagnosis of SARS-CoV-2 by  FDA under an Emergency Use Authorization (EUA). This EUA will remain  in effect (meaning this test can be used) for the duration of the  Covid-19 declaration under Section 564(b)(1) of the Act, 21  U.S.C. section 360bbb-3(b)(1), unless the authorization is  terminated or revoked. Performed at [facility], [place] [address], [place], [place] [address]   Blood Culture (routine x 2)     Status: None (Preliminary result)   Collection Time: 03/24/20  7:20 PM   Specimen: BLOOD  Result Value Ref Range Status   Specimen Description BLOOD LEFT ANTECUBITAL  Final   Special Requests AEROBIC BOTTLE ONLY Blood Culture adequate volume  Final   Culture   Final    NO GROWTH < 12 HOURS Performed at [facility], [place] [address], [place], [place] [address]    Report Status PENDING  Incomplete  Blood Culture (routine x 2)     Status: None (Preliminary result)   Collection Time: 03/24/20  7:30 PM   Specimen: BLOOD LEFT WRIST  Result Value Ref Range Status   Specimen Description BLOOD LEFT WRIST  Final   Special Requests AEROBIC BOTTLE ONLY Blood Culture adequate volume  Final   Culture   Final    NO GROWTH < 12  HOURS Performed at [facility], [place] [address] [place], [place] [address]    Report Status PENDING  Incomplete      Radiology Studies: DG Chest 2 View  Result Date: 03/23/2020 CLINICAL DATA:  Shortness of breath EXAM: CHEST - 2 VIEW COMPARISON:  February 25, 2018 FINDINGS: There is mild scarring in the left mid lung region. There is interstitial thickening in portions of the mid and lower lung regions bilaterally. No edema or airspace opacity. Heart size and pulmonary vascular normal. There is aortic atherosclerosis. Postoperative change noted in right shoulder. There is degenerative change in the thoracic spine. IMPRESSION: Suspect a degree of underlying chronic bronchitis. Slight scarring left mid lung. No edema or airspace opacity. Stable cardiac silhouette. Aortic Atherosclerosis (ICD10-I70.0). Electronically Signed   By: Lowella Grip III M.D.   On: 03/23/2020 12:44   CT ANGIO CHEST PE W OR WO CONTRAST  Result Date: 03/25/2020 CLINICAL DATA:  Shortness of breath, covid positive EXAM: CT ANGIOGRAPHY CHEST WITH CONTRAST TECHNIQUE: Multidetector CT imaging of the chest was performed using the standard protocol during bolus administration of intravenous contrast. Multiplanar CT image reconstructions and MIPs were obtained to evaluate the vascular anatomy. CONTRAST:  121mL OMNIPAQUE IOHEXOL 350 MG/ML SOLN COMPARISON:  None. FINDINGS: Cardiovascular: There is a optimal opacification of the pulmonary arteries. There is no central,segmental, or subsegmental filling defects within the pulmonary arteries. The heart is normal in size. No pericardial effusion or thickening. No evidence right heart strain. There is normal three-vessel brachiocephalic anatomy without proximal stenosis. Scattered aortic atherosclerosis is noted. Mediastinum/Nodes: No hilar, mediastinal, or axillary adenopathy. Thyroid gland, trachea, and esophagus demonstrate no significant findings. Lungs/Pleura: Multifocal patchy  airspace opacities are seen throughout both lungs, right greater than left. No pleural effusion or pneumothorax. Upper Abdomen: No acute abnormalities present in the visualized portions of the upper  abdomen. Low-density lesions are seen within the left kidney and anterior left hepatic lobe. Musculoskeletal: No chest wall abnormality. No acute or significant osseous findings. Review of the MIP images confirms the above findings. IMPRESSION: No central, segmental, or subsegmental pulmonary embolism. Multifocal patchy airspace opacities, right greater than left, consistent with multifocal viral pneumonia Electronically Signed   By: Prudencio Pair M.D.   On: 03/25/2020 03:14   DG Chest Portable 1 View  Result Date: 03/24/2020 CLINICAL DATA:  Body aches and shortness of breath. EXAM: PORTABLE CHEST 1 VIEW COMPARISON:  Single-view of the chest 02/25/2018. PA and lateral chest 03/23/2020. FINDINGS: There is some coarsening of the pulmonary interstitium. No consolidative process, pneumothorax or effusion. Aortic atherosclerosis. No acute or focal bony abnormality. IMPRESSION: No acute disease. Aortic Atherosclerosis (ICD10-I70.0). Electronically Signed   By: Inge Rise M.D.   On: 03/24/2020 11:54       LOS: 1 day   [facility] Pager on [email or web address]  03/25/2020, 11:52 AM

## 2020-03-25 NOTE — Progress Notes (Addendum)
PHARMACY - PHYSICIAN COMMUNICATION CRITICAL VALUE ALERT - BLOOD CULTURE IDENTIFICATION (BCID)  Amanda Ross is an 74 y.o. female who presented to [facility] on 03/24/2020 with COVID pneumonia.   Assessment:  Blood cultures drawn - aerobic bottles only sent to the lab. 1 out of 2 are positive for staph epidermidis. No resistance detected. WBC low to within normal limits. ANC within normal limits. Tmax 100.2 in setting of COVID. Potential for contamination   Name of physician (or Provider) Contacted: Dr, Cyd Silence  Current antibiotics: None  Changes to prescribed antibiotics recommended:  Recommendations accepted by provider - withhold therapy and repeat cultures as clinically stable  No results found for this or any previous visit.  Sloan Leiter, PharmD, BCPS, BCCCP Clinical Pharmacist Please refer to [facility] for [place] numbers 03/25/2020  8:24 PM

## 2020-03-25 NOTE — Plan of Care (Signed)

## 2020-03-25 NOTE — Consult Note (Signed)
   [facility] Inpatient Consult   03/25/2020  Amanda Ross 1945-10-31 [id number]   [place] Organization [ACO] Patient: Medicare NextGen  Was alerted by [place] Team of patient's hospitalization.   Patient will have the transition of care call conducted by the primary care provider. This patient is also in an Embedded practice which has a chronic disease management Embedded Care Management team [[facility] [place], Primary Care Provider, Dr. [name].  Plan: Notification to ve sent to the [place] Management for disposition and/or transition of care needs.  Please contact for further questions,  Natividad Brood, RN BSN [facility] Liaison  [phone] business mobile phone Toll free office [phone]  Fax number: [phone] [email or web address]

## 2020-03-25 NOTE — Progress Notes (Signed)
HOSPITAL MEDICINE OVERNIGHT EVENT NOTE    Notified by pharmacy that blood cultures obtained on 10/7 have 1 bottle positive for staph epidermidis (only 2 aerobic bottles were sent to the lab however).     Chart reviewed, most recent procalcitonin is only 0.16.  Patient not exhibiting any fevers, hemodynamic instability or significant leukocytosis.  Staph epidermidis finding is likely contaminant.    Will abstain from antibiotics, repeat blood cultures.  Amanda Emerald  MD [facility]

## 2020-03-26 ENCOUNTER — Inpatient Hospital Stay (HOSPITAL_COMMUNITY): Payer: Medicare Other

## 2020-03-26 DIAGNOSIS — U071 COVID-19: Secondary | ICD-10-CM

## 2020-03-26 DIAGNOSIS — R7989 Other specified abnormal findings of blood chemistry: Secondary | ICD-10-CM | POA: Diagnosis not present

## 2020-03-26 DIAGNOSIS — J9601 Acute respiratory failure with hypoxia: Secondary | ICD-10-CM | POA: Diagnosis not present

## 2020-03-26 LAB — SARS-COV-2, NAA 2 DAY TAT

## 2020-03-26 LAB — COMPREHENSIVE METABOLIC PANEL
ALT: 20 U/L (ref 0–44)
AST: 27 U/L (ref 15–41)
Albumin: 2.8 g/dL — ABNORMAL LOW (ref 3.5–5.0)
Alkaline Phosphatase: 54 U/L (ref 38–126)
Anion gap: 11 (ref 5–15)
BUN: 25 mg/dL — ABNORMAL HIGH (ref 8–23)
CO2: 22 mmol/L (ref 22–32)
Calcium: 9.2 mg/dL (ref 8.9–10.3)
Chloride: 105 mmol/L (ref 98–111)
Creatinine, Ser: 1.55 mg/dL — ABNORMAL HIGH (ref 0.44–1.00)
GFR, Estimated: 33 mL/min — ABNORMAL LOW (ref >60)
Glucose, Bld: 153 mg/dL — ABNORMAL HIGH (ref 70–99)
Potassium: 4 mmol/L (ref 3.5–5.1)
Sodium: 138 mmol/L (ref 135–145)
Total Bilirubin: 0.7 mg/dL (ref 0.3–1.2)
Total Protein: 6 g/dL — ABNORMAL LOW (ref 6.5–8.1)

## 2020-03-26 LAB — CBC WITH DIFFERENTIAL/PLATELET
Abs Immature Granulocytes: 0.03 10*3/uL (ref 0.00–0.07)
Basophils Absolute: 0 10*3/uL (ref 0.0–0.1)
Basophils Relative: 0 %
Eosinophils Absolute: 0 10*3/uL (ref 0.0–0.5)
Eosinophils Relative: 0 %
HCT: 36.3 % (ref 36.0–46.0)
Hemoglobin: 12.1 g/dL (ref 12.0–15.0)
Immature Granulocytes: 1 %
Lymphocytes Relative: 7 %
Lymphs Abs: 0.5 10*3/uL — ABNORMAL LOW (ref 0.7–4.0)
MCH: 29 pg (ref 26.0–34.0)
MCHC: 33.3 g/dL (ref 30.0–36.0)
MCV: 87.1 fL (ref 80.0–100.0)
Monocytes Absolute: 0.3 10*3/uL (ref 0.1–1.0)
Monocytes Relative: 4 %
Neutro Abs: 5.7 10*3/uL (ref 1.7–7.7)
Neutrophils Relative %: 88 %
Platelets: 146 10*3/uL — ABNORMAL LOW (ref 150–400)
RBC: 4.17 MIL/uL (ref 3.87–5.11)
RDW: 13.5 % (ref 11.5–15.5)
WBC: 6.5 10*3/uL (ref 4.0–10.5)
nRBC: 0 % (ref 0.0–0.2)

## 2020-03-26 LAB — CULTURE, BLOOD (ROUTINE X 2): Special Requests: ADEQUATE

## 2020-03-26 LAB — NOVEL CORONAVIRUS, NAA: SARS-CoV-2, NAA: DETECTED — AB

## 2020-03-26 LAB — D-DIMER, QUANTITATIVE: D-Dimer, Quant: 0.83 ug/mL-FEU — ABNORMAL HIGH (ref 0.00–0.50)

## 2020-03-26 LAB — PROCALCITONIN: Procalcitonin: 0.14 ng/mL

## 2020-03-26 LAB — C-REACTIVE PROTEIN: CRP: 13.1 mg/dL — ABNORMAL HIGH (ref <1.0)

## 2020-03-26 NOTE — Progress Notes (Signed)
PROGRESS NOTE  Amanda Ross GYK:[id number] DOB: 12-Nov-1945 DOA: 03/24/2020  PCP: [name], MD  Brief History/Interval Summary: 74 y.o. female with history of asthma, HTN, OSA, primary hypothyroidism and left thyroid nodule resented with myalgias, chills, intermittent cough) shortness of breath.  She tested positive for COVID-19 at her PCP office.  She apparently desaturated into the mid 80s and was sent over to the emergency department.  Chest x-ray did not show any significant findings.  CT angiogram did not show PE but did show multifocal patchy airspace opacities right greater than left.    Reason for Visit: Pneumonia due to COVID-19.  Acute respiratory failure with hypoxia.  Consultants: None  Procedures: None  Antibiotics: Anti-infectives (From admission, onward)   Start     Dose/Rate Route Frequency Ordered Stop   03/25/20 1000  remdesivir 100 mg in sodium chloride 0.9 % 100 mL IVPB       "Followed by" Linked Group Details   100 mg 200 mL/hr over 30 Minutes Intravenous Daily 03/24/20 1533 03/29/20 0959   03/24/20 1545  remdesivir 200 mg in sodium chloride 0.9% 250 mL IVPB       "Followed by" Linked Group Details   200 mg 580 mL/hr over 30 Minutes Intravenous Once 03/24/20 1533 03/24/20 1940      Subjective/Interval History:  Patient in bed, appears comfortable, denies any headache, no fever, no chest pain or pressure, no shortness of breath , no abdominal pain. No focal weakness.     Assessment/Plan:  Acute Hypoxic Resp. Failure/Pneumonia due to COVID-19 - she is fully vaccinated and fortunately seems to have a mild breakthrough disease, CTA negative, currently placed on steroids and Remdesivir and seems to be responding well to treatment.  Encouraged to sit up in chair use I-S and flutter valve for pulmonary toiletry, monitor inflammatory markers.  She has consented for Actemra/Baricitinib use if her clinical condition declines.    Recent Labs  Lab  03/24/20 1013 03/24/20 1039 03/24/20 1046 03/24/20 1152 03/24/20 1936 03/25/20 0300 03/26/20 0210 03/26/20 0904  WBC  --  4.0  --   --   --  2.7* 6.5  --   CRP  --   --   --   --  17.0* 18.5* 13.1*  --   DDIMER  --   --   --  1.47*  --  1.02* 0.83*  --   PROCALCITON  --   --   --   --  0.16  --   --  0.14  AST  --   --   --   --   --  28 27  --   ALT  --   --   --   --   --  19 20  --   ALKPHOS  --   --   --   --   --  58 54  --   BILITOT  --   --   --   --   --  0.6 0.7  --   ALBUMIN  --   --   --   --   --  2.8* 2.8*  --   SARSCOV2NAA Detected*  --  POSITIVE*  --   --   --   --   --       1 out of 2 positive blood culture.  Likely contaminant.  Monitor.  Acute kidney injury on chronic kidney disease stage IIIb -  Patient was on ARB/HCTZ which  could be responsible.  These medications have been held for now.  Renal function has improved.  Continue to monitor urine output.  Avoid nephrotoxic agents.  Renal function improving baseline creatinine around 1.4.  Obstructive sleep apnea - Continue with oxygen.  History of asthma - Steroids and inhalers as above.  Essential hypertension - Continue metoprolol.  Holding antihypertensives.  History of overactive bladder - Monitor.  Continue oxybutynin.  Mild Thrombocytopenia - Due to viral infection.  Stable.  Lab Results  Component Value Date   PLT 146 (L) 03/26/2020     Morbid obesity Estimated body mass index is 39.86 kg/m as calculated from the following:   Height as of this encounter: 5\' 3"  (1.6 m).   Weight as of this encounter: 102.1 kg.  Follow with PCP for weight loss.    DVT Prophylaxis: Lovenox Code Status: Full code Family Communication: Discussed with patient.  Called her husband [name] [phone] -03/26/2020 at 9:45 AM, full mailbox. Disposition Plan: Hopefully return home when improved  Status is: Inpatient  Remains inpatient appropriate because:IV treatments appropriate due to intensity of illness or  inability to take PO and Inpatient level of care appropriate due to severity of illness   Dispo: The patient is from: Home              Anticipated d/c is to: Home              Anticipated d/c date is: 2 days              Patient currently is not medically stable to d/c.      Medications:  Scheduled: . vitamin C  500 mg Oral Daily  . aspirin EC  81 mg Oral Daily  . cholecalciferol  2,000 Units Oral Daily  . enoxaparin (LOVENOX) injection  40 mg Subcutaneous Q24H  . Ipratropium-Albuterol  1 puff Inhalation Q6H  . methylPREDNISolone (SOLU-MEDROL) injection  60 mg Intravenous Q12H  . metoprolol tartrate  12.5 mg Oral BID  . oxybutynin  10 mg Oral QHS  . pantoprazole  40 mg Oral Daily  . traZODone  50 mg Oral QHS  . zinc sulfate  220 mg Oral Daily   Continuous: . remdesivir 100 mg in NS 100 mL 100 mg (03/26/20 0929)   EXH:BZJIRCVELFYBO, baclofen, bisacodyl, chlorpheniramine-HYDROcodone, guaiFENesin-dextromethorphan, hydroxypropyl methylcellulose / hypromellose, magnesium citrate, [DISCONTINUED] ondansetron **OR** ondansetron (ZOFRAN) IV, polyethylene glycol, zolpidem   Objective:  Vital Signs  Vitals:   03/25/20 2000 03/25/20 2157 03/26/20 0054 03/26/20 0721  BP:  (!) 144/65  129/75  Pulse:  72  74  Resp:  17  16  Temp:  98 F (36.7 C)  98.4 F (36.9 C)  TempSrc:  Oral    SpO2: 92% 92% 90% 97%  Weight:      Height:        Intake/Output Summary (Last 24 hours) at 03/26/2020 0944 Last data filed at 03/25/2020 2027 Gross per 24 hour  Intake 1058.59 ml  Output --  Net 1058.59 ml   Filed Weights   03/24/20 1033  Weight: 102.1 kg    EXAM  Awake Alert, No new F.N deficits, Normal affect [place].AT,PERRAL Supple Neck,No JVD, No cervical lymphadenopathy appriciated.  Symmetrical Chest wall movement, Good air movement bilaterally, CTAB RRR,No Gallops, Rubs or new Murmurs, No Parasternal Heave +ve B.Sounds, Abd Soft, No tenderness, No organomegaly appriciated, No  rebound - guarding or rigidity. No Cyanosis, Clubbing or edema, No new Rash or bruise   Lab Results:  Data Reviewed: I have personally reviewed following labs and imaging studies  CBC: Recent Labs  Lab 03/24/20 1039 03/25/20 0300 03/26/20 0210  WBC 4.0 2.7* 6.5  NEUTROABS  --  2.3 5.7  HGB 13.2 12.1 12.1  HCT 40.9 36.5 36.3  MCV 90.3 88.0 87.1  PLT 126* PLATELET CLUMPS NOTED ON SMEAR, COUNT APPEARS ADEQUATE 146*    Basic Metabolic Panel: Recent Labs  Lab 03/24/20 1039 03/24/20 1936 03/25/20 0300 03/26/20 0210  NA 139 138 138 138  K 3.6 4.0 4.0 4.0  CL 102 103 105 105  CO2 24 20* 21* 22  GLUCOSE 86 97 148* 153*  BUN 23 23 23  25*  CREATININE 1.71* 1.61* 1.55* 1.55*  CALCIUM 9.2 8.9 8.8* 9.2    GFR: Estimated Creatinine Clearance: 36.3 mL/min (A) (by C-G formula based on SCr of 1.55 mg/dL (H)).  Liver Function Tests: Recent Labs  Lab 03/25/20 0300 03/26/20 0210  AST 28 27  ALT 19 20  ALKPHOS 58 54  BILITOT 0.6 0.7  PROT 6.2* 6.0*  ALBUMIN 2.8* 2.8*     CBG: Recent Labs  Lab 03/24/20 1039 03/24/20 1052  GLUCAP 81 85    Lipid Profile: Recent Labs    03/24/20 1936  TRIG 82     Anemia Panel: Recent Labs    03/24/20 1936 03/25/20 0300  FERRITIN 496* 593*    Recent Results (from the past 240 hour(s))  Novel Coronavirus, NAA ([facility])     Status: Abnormal   Collection Time: 03/24/20 10:13 AM   Specimen: Nasopharyngeal(NP) swabs in vial transport medium  Result Value Ref Range Status   SARS-CoV-2, NAA Detected (A) Not Detected Final    Comment: Patients who have a positive COVID-19 test result may now have treatment options. Treatment options are available for patients with mild to moderate symptoms and for hospitalized patients. Visit our website at [email or web address] for resources and information. This nucleic acid amplification test was developed and its performance characteristics determined by [employer].  Nucleic acid amplification tests include RT-PCR and TMA. This test has not been FDA cleared or approved. This test has been authorized by FDA under an Emergency Use Authorization (EUA). This test is only authorized for the duration of time the declaration that circumstances exist justifying the authorization of the emergency use of in vitro diagnostic tests for detection of SARS-CoV-2 virus and/or diagnosis of COVID-19 infection under section 564(b)(1) of the Act, 21 U.S.C. 979GXQ-1(J) (1), unless the authorization is terminated or revoked sooner. When diagnostic testing is negativ e, the possibility of a false negative result should be considered in the context of a patient's recent exposures and the presence of clinical signs and symptoms consistent with COVID-19. An individual without symptoms of COVID-19 and who is not shedding SARS-CoV-2 virus would expect to have a negative (not detected) result in this assay.   SARS-COV-2, NAA 2 DAY TAT     Status: None   Collection Time: 03/24/20 10:13 AM  Result Value Ref Range Status   SARS-CoV-2, NAA 2 DAY TAT Performed  Final  Respiratory Panel by RT PCR (Flu A&B, Covid) - Nasopharyngeal Swab     Status: Abnormal   Collection Time: 03/24/20 10:46 AM   Specimen: Nasopharyngeal Swab  Result Value Ref Range Status   SARS Coronavirus 2 by RT PCR POSITIVE (A) NEGATIVE Final    Comment: emailed L. Berdik RN 12:45 03/24/20 ([email or web address]) (NOTE) SARS-CoV-2 target nucleic acids are DETECTED.  SARS-CoV-2 RNA is generally detectable in upper respiratory  specimens  during the acute phase of infection. Positive results are indicative of the presence of the identified virus, but do not rule out bacterial infection or co-infection with other pathogens not detected by the test. Clinical correlation with patient history and other diagnostic information is necessary to determine patient infection status. The expected result is Negative.  Fact Sheet for  Patients:  PinkCheek.be  Fact Sheet for Healthcare Providers: GravelBags.it  This test is not yet approved or cleared by the [place] FDA and  has been authorized for detection and/or diagnosis of SARS-CoV-2 by FDA under an Emergency Use Authorization (EUA).  This EUA will remain in effect (meaning this test can be used) for the duration of  the COVID-19  declaration under Section 564(b)(1) of the Act, 21 U.S.C. section 360bbb-3(b)(1), unless the authorization is terminated or revoked sooner.      Influenza A by PCR NEGATIVE NEGATIVE Final   Influenza B by PCR NEGATIVE NEGATIVE Final    Comment: (NOTE) The Xpert Xpress SARS-CoV-2/FLU/RSV assay is intended as an aid in  the diagnosis of influenza from Nasopharyngeal swab specimens and  should not be used as a sole basis for treatment. Nasal washings and  aspirates are unacceptable for Xpert Xpress SARS-CoV-2/FLU/RSV  testing.  Fact Sheet for Patients: PinkCheek.be  Fact Sheet for Healthcare Providers: GravelBags.it  This test is not yet approved or cleared by the [place] FDA and  has been authorized for detection and/or diagnosis of SARS-CoV-2 by  FDA under an Emergency Use Authorization (EUA). This EUA will remain  in effect (meaning this test can be used) for the duration of the  Covid-19 declaration under Section 564(b)(1) of the Act, 21  U.S.C. section 360bbb-3(b)(1), unless the authorization is  terminated or revoked. Performed at [facility], [place] [address], [place], [place] [address]   Blood Culture (routine x 2)     Status: None (Preliminary result)   Collection Time: 03/24/20  7:20 PM   Specimen: BLOOD  Result Value Ref Range Status   Specimen Description BLOOD LEFT ANTECUBITAL  Final   Special Requests AEROBIC BOTTLE ONLY Blood Culture adequate volume  Final   Culture   Final     NO GROWTH 2 DAYS Performed at [facility], [place] [address] [place], [place] [address]    Report Status PENDING  Incomplete  Blood Culture (routine x 2)     Status: Abnormal   Collection Time: 03/24/20  7:30 PM   Specimen: BLOOD LEFT WRIST  Result Value Ref Range Status   Specimen Description BLOOD LEFT WRIST  Final   Special Requests AEROBIC BOTTLE ONLY Blood Culture adequate volume  Final   Culture  Setup Time   Final    GRAM POSITIVE COCCI AEROBIC BOTTLE ONLY CRITICAL RESULT CALLED TO, READ BACK BY AND VERIFIED WITH: PHARMD J MILLEN 03/25/20 AT 2024 SK    Culture (A)  Final    STAPHYLOCOCCUS EPIDERMIDIS THE SIGNIFICANCE OF ISOLATING THIS ORGANISM FROM A SINGLE SET OF BLOOD CULTURES WHEN MULTIPLE SETS ARE DRAWN IS UNCERTAIN. PLEASE NOTIFY THE MICROBIOLOGY DEPARTMENT WITHIN ONE WEEK IF SPECIATION AND SENSITIVITIES ARE REQUIRED. Performed at [facility], [place] [address] [place], [place] [address]    Report Status 03/26/2020 FINAL  Final  Blood Culture ID Panel (Reflexed)     Status: Abnormal   Collection Time: 03/24/20  7:30 PM  Result Value Ref Range Status   Enterococcus faecalis NOT DETECTED NOT DETECTED Final  Enterococcus Faecium NOT DETECTED NOT DETECTED Final   Listeria monocytogenes NOT DETECTED NOT DETECTED Final   Staphylococcus species DETECTED (A) NOT DETECTED Final    Comment: CRITICAL RESULT CALLED TO, READ BACK BY AND VERIFIED WITH: PHARMD J MILLEN 03/25/20 AT 2024 SK    Staphylococcus aureus (BCID) NOT DETECTED NOT DETECTED Final   Staphylococcus epidermidis DETECTED (A) NOT DETECTED Final    Comment: CRITICAL RESULT CALLED TO, READ BACK BY AND VERIFIED WITH: PHARMD J MILLEN 03/25/20 AT 2024 SK    Staphylococcus lugdunensis NOT DETECTED NOT DETECTED Final   Streptococcus species NOT DETECTED NOT DETECTED Final   Streptococcus agalactiae NOT DETECTED NOT DETECTED Final   Streptococcus pneumoniae NOT DETECTED NOT DETECTED Final   Streptococcus  pyogenes NOT DETECTED NOT DETECTED Final   A.calcoaceticus-baumannii NOT DETECTED NOT DETECTED Final   Bacteroides fragilis NOT DETECTED NOT DETECTED Final   Enterobacterales NOT DETECTED NOT DETECTED Final   Enterobacter cloacae complex NOT DETECTED NOT DETECTED Final   Escherichia coli NOT DETECTED NOT DETECTED Final   Klebsiella aerogenes NOT DETECTED NOT DETECTED Final   Klebsiella oxytoca NOT DETECTED NOT DETECTED Final   Klebsiella pneumoniae NOT DETECTED NOT DETECTED Final   Proteus species NOT DETECTED NOT DETECTED Final   Salmonella species NOT DETECTED NOT DETECTED Final   Serratia marcescens NOT DETECTED NOT DETECTED Final   Haemophilus influenzae NOT DETECTED NOT DETECTED Final   Neisseria meningitidis NOT DETECTED NOT DETECTED Final   Pseudomonas aeruginosa NOT DETECTED NOT DETECTED Final   Stenotrophomonas maltophilia NOT DETECTED NOT DETECTED Final   Candida albicans NOT DETECTED NOT DETECTED Final   Candida auris NOT DETECTED NOT DETECTED Final   Candida glabrata NOT DETECTED NOT DETECTED Final   Candida krusei NOT DETECTED NOT DETECTED Final   Candida parapsilosis NOT DETECTED NOT DETECTED Final   Candida tropicalis NOT DETECTED NOT DETECTED Final   Cryptococcus neoformans/gattii NOT DETECTED NOT DETECTED Final   Methicillin resistance mecA/C NOT DETECTED NOT DETECTED Final    Comment: Performed at [facility], [address] [place], [place] [address]  Culture, blood (routine x 2)     Status: None (Preliminary result)   Collection Time: 03/25/20 10:02 PM   Specimen: BLOOD LEFT ARM  Result Value Ref Range Status   Specimen Description BLOOD LEFT ARM  Final   Special Requests   Final    BOTTLES DRAWN AEROBIC AND ANAEROBIC Blood Culture adequate volume   Culture   Final    NO GROWTH < 12 HOURS Performed at [facility], [place] [address] [place], [place] [address]    Report Status PENDING  Incomplete  Culture, blood (routine x 2)     Status: None  (Preliminary result)   Collection Time: 03/25/20 10:03 PM   Specimen: BLOOD LEFT HAND  Result Value Ref Range Status   Specimen Description BLOOD LEFT HAND  Final   Special Requests   Final    BOTTLES DRAWN AEROBIC AND ANAEROBIC Blood Culture adequate volume   Culture   Final    NO GROWTH < 12 HOURS Performed at [facility], [place] [address] [place], [place] [address]    Report Status PENDING  Incomplete      Radiology Studies: CT ANGIO CHEST PE W OR WO CONTRAST  Result Date: 03/25/2020 CLINICAL DATA:  Shortness of breath, covid positive EXAM: CT ANGIOGRAPHY CHEST WITH CONTRAST TECHNIQUE: Multidetector CT imaging of the chest was performed using the standard protocol during bolus administration  of intravenous contrast. Multiplanar CT image reconstructions and MIPs were obtained to evaluate the vascular anatomy. CONTRAST:  122mL OMNIPAQUE IOHEXOL 350 MG/ML SOLN COMPARISON:  None. FINDINGS: Cardiovascular: There is a optimal opacification of the pulmonary arteries. There is no central,segmental, or subsegmental filling defects within the pulmonary arteries. The heart is normal in size. No pericardial effusion or thickening. No evidence right heart strain. There is normal three-vessel brachiocephalic anatomy without proximal stenosis. Scattered aortic atherosclerosis is noted. Mediastinum/Nodes: No hilar, mediastinal, or axillary adenopathy. Thyroid gland, trachea, and esophagus demonstrate no significant findings. Lungs/Pleura: Multifocal patchy airspace opacities are seen throughout both lungs, right greater than left. No pleural effusion or pneumothorax. Upper Abdomen: No acute abnormalities present in the visualized portions of the upper abdomen. Low-density lesions are seen within the left kidney and anterior left hepatic lobe. Musculoskeletal: No chest wall abnormality. No acute or significant osseous findings. Review of the MIP images confirms the above findings. IMPRESSION: No central,  segmental, or subsegmental pulmonary embolism. Multifocal patchy airspace opacities, right greater than left, consistent with multifocal viral pneumonia Electronically Signed   By: Prudencio Pair M.D.   On: 03/25/2020 03:14   DG Chest Portable 1 View  Result Date: 03/24/2020 CLINICAL DATA:  Body aches and shortness of breath. EXAM: PORTABLE CHEST 1 VIEW COMPARISON:  Single-view of the chest 02/25/2018. PA and lateral chest 03/23/2020. FINDINGS: There is some coarsening of the pulmonary interstitium. No consolidative process, pneumothorax or effusion. Aortic atherosclerosis. No acute or focal bony abnormality. IMPRESSION: No acute disease. Aortic Atherosclerosis (ICD10-I70.0). Electronically Signed   By: Inge Rise M.D.   On: 03/24/2020 11:54       LOS: 2 days   [facility] Pager on [email or web address]  03/26/2020, 9:44 AM

## 2020-03-26 NOTE — Progress Notes (Signed)
Attempted lower extremity venous duplex, however there are no functioning CAPR helmets available on the unit. Will attempt again when proper PPE is available, as schedule permits.  03/26/2020 11:27 AM Kelby Aline., MHA, RVT, RDCS, RDMS

## 2020-03-26 NOTE — Progress Notes (Signed)
Bilateral lower extremity venous duplex completed. Refer to "CV Proc" under chart review to view preliminary results.  03/26/2020 1:56 PM Kelby Aline., MHA, RVT, RDCS, RDMS

## 2020-03-27 DIAGNOSIS — U071 COVID-19: Secondary | ICD-10-CM | POA: Diagnosis not present

## 2020-03-27 DIAGNOSIS — J9601 Acute respiratory failure with hypoxia: Secondary | ICD-10-CM | POA: Diagnosis not present

## 2020-03-27 LAB — C-REACTIVE PROTEIN: CRP: 6.4 mg/dL — ABNORMAL HIGH (ref <1.0)

## 2020-03-27 LAB — CBC WITH DIFFERENTIAL/PLATELET
Abs Immature Granulocytes: 0.04 10*3/uL (ref 0.00–0.07)
Basophils Absolute: 0 10*3/uL (ref 0.0–0.1)
Basophils Relative: 0 %
Eosinophils Absolute: 0 10*3/uL (ref 0.0–0.5)
Eosinophils Relative: 0 %
HCT: 38 % (ref 36.0–46.0)
Hemoglobin: 12.9 g/dL (ref 12.0–15.0)
Immature Granulocytes: 1 %
Lymphocytes Relative: 7 %
Lymphs Abs: 0.5 10*3/uL — ABNORMAL LOW (ref 0.7–4.0)
MCH: 29.7 pg (ref 26.0–34.0)
MCHC: 33.9 g/dL (ref 30.0–36.0)
MCV: 87.4 fL (ref 80.0–100.0)
Monocytes Absolute: 0.3 10*3/uL (ref 0.1–1.0)
Monocytes Relative: 5 %
Neutro Abs: 6.4 10*3/uL (ref 1.7–7.7)
Neutrophils Relative %: 87 %
Platelets: 165 10*3/uL (ref 150–400)
RBC: 4.35 MIL/uL (ref 3.87–5.11)
RDW: 13.7 % (ref 11.5–15.5)
WBC: 7.3 10*3/uL (ref 4.0–10.5)
nRBC: 0 % (ref 0.0–0.2)

## 2020-03-27 LAB — BRAIN NATRIURETIC PEPTIDE: B Natriuretic Peptide: 97.3 pg/mL (ref 0.0–100.0)

## 2020-03-27 LAB — COMPREHENSIVE METABOLIC PANEL
ALT: 21 U/L (ref 0–44)
AST: 28 U/L (ref 15–41)
Albumin: 2.6 g/dL — ABNORMAL LOW (ref 3.5–5.0)
Alkaline Phosphatase: 53 U/L (ref 38–126)
Anion gap: 9 (ref 5–15)
BUN: 25 mg/dL — ABNORMAL HIGH (ref 8–23)
CO2: 25 mmol/L (ref 22–32)
Calcium: 9.9 mg/dL (ref 8.9–10.3)
Chloride: 105 mmol/L (ref 98–111)
Creatinine, Ser: 1.3 mg/dL — ABNORMAL HIGH (ref 0.44–1.00)
GFR, Estimated: 40 mL/min — ABNORMAL LOW (ref >60)
Glucose, Bld: 152 mg/dL — ABNORMAL HIGH (ref 70–99)
Potassium: 4.2 mmol/L (ref 3.5–5.1)
Sodium: 139 mmol/L (ref 135–145)
Total Bilirubin: 0.3 mg/dL (ref 0.3–1.2)
Total Protein: 5.8 g/dL — ABNORMAL LOW (ref 6.5–8.1)

## 2020-03-27 LAB — D-DIMER, QUANTITATIVE: D-Dimer, Quant: 0.62 ug/mL-FEU — ABNORMAL HIGH (ref 0.00–0.50)

## 2020-03-27 LAB — MAGNESIUM: Magnesium: 2 mg/dL (ref 1.7–2.4)

## 2020-03-27 LAB — PROCALCITONIN: Procalcitonin: 0.1 ng/mL

## 2020-03-27 NOTE — Progress Notes (Signed)
PROGRESS NOTE  Amanda Ross HKV:[id number] DOB: 1945/11/21 DOA: 03/24/2020  PCP: [name], MD  Brief History/Interval Summary: 74 y.o. female with history of asthma, HTN, OSA, primary hypothyroidism and left thyroid nodule resented with myalgias, chills, intermittent cough) shortness of breath.  She tested positive for COVID-19 at her PCP office.  She apparently desaturated into the mid 80s and was sent over to the emergency department.  Chest x-ray did not show any significant findings.  CT angiogram did not show PE but did show multifocal patchy airspace opacities right greater than left.    Reason for Visit: Pneumonia due to COVID-19.  Acute respiratory failure with hypoxia.  Consultants: None  Procedures: None  Antibiotics: Anti-infectives (From admission, onward)   Start     Dose/Rate Route Frequency Ordered Stop   03/25/20 1000  remdesivir 100 mg in sodium chloride 0.9 % 100 mL IVPB       "Followed by" Linked Group Details   100 mg 200 mL/hr over 30 Minutes Intravenous Daily 03/24/20 1533 03/29/20 0959   03/24/20 1545  remdesivir 200 mg in sodium chloride 0.9% 250 mL IVPB       "Followed by" Linked Group Details   200 mg 580 mL/hr over 30 Minutes Intravenous Once 03/24/20 1533 03/24/20 1940      Subjective/Interval History:  Patient in bed, appears comfortable, denies any headache, no fever, no chest pain or pressure, no shortness of breath , no abdominal pain. No focal weakness.   Assessment/Plan:  Acute Hypoxic Resp. Failure/Pneumonia due to COVID-19 - she is fully vaccinated and fortunately seems to have a mild breakthrough disease, CTA negative, currently placed on steroids and Remdesivir and seems to be responding well to treatment.  Encouraged to sit up in chair use I-S and flutter valve for pulmonary toiletry, clinically stable, likely discharge in the next 1 to 2 days if improvement continues.  SpO2: 94 % O2 Flow Rate (L/min): 1 L/min   Recent Labs    Lab 03/24/20 1013 03/24/20 1039 03/24/20 1046 03/24/20 1152 03/24/20 1936 03/25/20 0300 03/26/20 0210 03/26/20 0904 03/27/20 0228 03/27/20 0229  WBC  --  4.0  --   --   --  2.7* 6.5  --  7.3  --   CRP  --   --   --   --  17.0* 18.5* 13.1*  --  6.4*  --   DDIMER  --   --   --  1.47*  --  1.02* 0.83*  --  0.62*  --   BNP  --   --   --   --   --   --   --   --   --  97.3  PROCALCITON  --   --   --   --  0.16  --   --  0.14 0.10  --   AST  --   --   --   --   --  28 27  --  28  --   ALT  --   --   --   --   --  19 20  --  21  --   ALKPHOS  --   --   --   --   --  58 54  --  53  --   BILITOT  --   --   --   --   --  0.6 0.7  --  0.3  --   ALBUMIN  --   --   --   --   --  2.8* 2.8*  --  2.6*  --   SARSCOV2NAA Detected*  --  POSITIVE*  --   --   --   --   --   --   --       1 out of 2 staph epidermis positive blood culture.  Likely contaminant.  Monitor.  Acute kidney injury on chronic kidney disease stage IIIb -  Patient was on ARB/HCTZ which could be responsible.  These medications have been held for now.  Renal function has improved.  Continue to monitor urine output.  Avoid nephrotoxic agents.  Renal function improving baseline creatinine around 1.4.  Obstructive sleep apnea - Continue with oxygen.  History of asthma - Steroids and inhalers as above.  Essential hypertension - Continue metoprolol.  Holding antihypertensives.  History of overactive bladder - Monitor.  Continue oxybutynin.  Mild Thrombocytopenia - Due to viral infection.  Stable.  Lab Results  Component Value Date   PLT 165 03/27/2020     Morbid obesity - BMI  Of > 39 Follow with PCP for weight loss.    DVT Prophylaxis: Lovenox Code Status: Full code Family Communication: Discussed with patient.  Called her husband [name] [phone] -03/26/2020 at 9:45 AM, full mailbox. Disposition Plan: Hopefully return home when improved  Status is: Inpatient  Remains inpatient appropriate because:IV treatments  appropriate due to intensity of illness or inability to take PO and Inpatient level of care appropriate due to severity of illness   Dispo: The patient is from: Home              Anticipated d/c is to: Home              Anticipated d/c date is: 2 days              Patient currently is not medically stable to d/c.      Medications:  Scheduled:  vitamin C  500 mg Oral Daily   aspirin EC  81 mg Oral Daily   cholecalciferol  2,000 Units Oral Daily   enoxaparin (LOVENOX) injection  40 mg Subcutaneous Q24H   Ipratropium-Albuterol  1 puff Inhalation Q6H   methylPREDNISolone (SOLU-MEDROL) injection  60 mg Intravenous Q12H   metoprolol tartrate  12.5 mg Oral BID   oxybutynin  10 mg Oral QHS   pantoprazole  40 mg Oral Daily   traZODone  50 mg Oral QHS   zinc sulfate  220 mg Oral Daily   Continuous:  remdesivir 100 mg in NS 100 mL Stopped (03/26/20 1838)   KWI:OXBDZHGDJMEQA, baclofen, bisacodyl, chlorpheniramine-HYDROcodone, guaiFENesin-dextromethorphan, hydroxypropyl methylcellulose / hypromellose, magnesium citrate, [DISCONTINUED] ondansetron **OR** ondansetron (ZOFRAN) IV, polyethylene glycol, zolpidem   Objective:  Vital Signs  Vitals:   03/26/20 0721 03/26/20 1605 03/26/20 2041 03/27/20 0706  BP: 129/75 136/61 139/81 135/76  Pulse: 74 71 73 71  Resp: 16 16 18 17   Temp: 98.4 F (36.9 C) 98.5 F (36.9 C) 98 F (36.7 C) 98.1 F (36.7 C)  TempSrc:   Oral Oral  SpO2: 97% 94% 95% 94%  Weight:      Height:       No intake or output data in the 24 hours ending 03/27/20 0926 Filed Weights   03/24/20 1033  Weight: 102.1 kg    EXAM  Awake Alert, No new F.N deficits, Normal affect [place].AT,PERRAL Supple Neck,No JVD, No cervical lymphadenopathy appriciated.  Symmetrical Chest wall movement, Good air movement bilaterally, CTAB RRR,No Gallops, Rubs or new Murmurs, No Parasternal Heave +ve B.Sounds,  Abd Soft, No tenderness, No organomegaly appriciated, No rebound -  guarding or rigidity. No Cyanosis, Clubbing or edema, No new Rash or bruise   Lab Results:  Data Reviewed: I have personally reviewed following labs and imaging studies  Recent Labs  Lab 03/24/20 1039 03/25/20 0300 03/26/20 0210 03/27/20 0228  WBC 4.0 2.7* 6.5 7.3  HGB 13.2 12.1 12.1 12.9  HCT 40.9 36.5 36.3 38.0  PLT 126* PLATELET CLUMPS NOTED ON SMEAR, COUNT APPEARS ADEQUATE 146* 165  MCV 90.3 88.0 87.1 87.4  MCH 29.1 29.2 29.0 29.7  MCHC 32.3 33.2 33.3 33.9  RDW 13.7 13.5 13.5 13.7  LYMPHSABS  --  0.2* 0.5* 0.5*  MONOABS  --  0.1 0.3 0.3  EOSABS  --  0.0 0.0 0.0  BASOSABS  --  0.0 0.0 0.0    Recent Labs  Lab 03/24/20 1039 03/24/20 1152 03/24/20 1936 03/25/20 0300 03/26/20 0210 03/26/20 0904 03/27/20 0228 03/27/20 0229  NA 139  --  138 138 138  --  139  --   K 3.6  --  4.0 4.0 4.0  --  4.2  --   CL 102  --  103 105 105  --  105  --   CO2 24  --  20* 21* 22  --  25  --   GLUCOSE 86  --  97 148* 153*  --  152*  --   BUN 23  --  23 23 25*  --  25*  --   CREATININE 1.71*  --  1.61* 1.55* 1.55*  --  1.30*  --   CALCIUM 9.2  --  8.9 8.8* 9.2  --  9.9  --   AST  --   --   --  28 27  --  28  --   ALT  --   --   --  19 20  --  21  --   ALKPHOS  --   --   --  58 54  --  53  --   BILITOT  --   --   --  0.6 0.7  --  0.3  --   ALBUMIN  --   --   --  2.8* 2.8*  --  2.6*  --   MG  --   --   --   --   --   --  2.0  --   CRP  --   --  17.0* 18.5* 13.1*  --  6.4*  --   DDIMER  --  1.47*  --  1.02* 0.83*  --  0.62*  --   PROCALCITON  --   --  0.16  --   --  0.14 0.10  --   BNP  --   --   --   --   --   --   --  97.3    Recent Labs  Lab 03/24/20 1013 03/24/20 1039 03/24/20 1046 03/24/20 1152 03/24/20 1936 03/25/20 0300 03/26/20 0210 03/26/20 0904 03/27/20 0228 03/27/20 0229  WBC  --  4.0  --   --   --  2.7* 6.5  --  7.3  --   CRP  --   --   --   --  17.0* 18.5* 13.1*  --  6.4*  --   DDIMER  --   --   --  1.47*  --  1.02* 0.83*  --  0.62*  --   BNP  --   --   --    --   --   --   --   --   --  97.3  PROCALCITON  --   --   --   --  0.16  --   --  0.14 0.10  --   AST  --   --   --   --   --  28 27  --  28  --   ALT  --   --   --   --   --  19 20  --  21  --   ALKPHOS  --   --   --   --   --  58 54  --  53  --   BILITOT  --   --   --   --   --  0.6 0.7  --  0.3  --   ALBUMIN  --   --   --   --   --  2.8* 2.8*  --  2.6*  --   SARSCOV2NAA Detected*  --  POSITIVE*  --   --   --   --   --   --   --         Recent Results (from the past 240 hour(s))  Novel Coronavirus, NAA ([facility])     Status: Abnormal   Collection Time: 03/24/20 10:13 AM   Specimen: Nasopharyngeal(NP) swabs in vial transport medium  Result Value Ref Range Status   SARS-CoV-2, NAA Detected (A) Not Detected Final    Comment: Patients who have a positive COVID-19 test result may now have treatment options. Treatment options are available for patients with mild to moderate symptoms and for hospitalized patients. Visit our website at [email or web address] for resources and information. This nucleic acid amplification test was developed and its performance characteristics determined by [employer]. Nucleic acid amplification tests include RT-PCR and TMA. This test has not been FDA cleared or approved. This test has been authorized by FDA under an Emergency Use Authorization (EUA). This test is only authorized for the duration of time the declaration that circumstances exist justifying the authorization of the emergency use of in vitro diagnostic tests for detection of SARS-CoV-2 virus and/or diagnosis of COVID-19 infection under section 564(b)(1) of the Act, 21 U.S.C. 604VWU-9(W) (1), unless the authorization is terminated or revoked sooner. When diagnostic testing is negativ e, the possibility of a false negative result should be considered in the context of a patient's recent exposures and the presence of clinical signs and symptoms consistent with COVID-19. An  individual without symptoms of COVID-19 and who is not shedding SARS-CoV-2 virus would expect to have a negative (not detected) result in this assay.   SARS-COV-2, NAA 2 DAY TAT     Status: None   Collection Time: 03/24/20 10:13 AM  Result Value Ref Range Status   SARS-CoV-2, NAA 2 DAY TAT Performed  Final  Respiratory Panel by RT PCR (Flu A&B, Covid) - Nasopharyngeal Swab     Status: Abnormal   Collection Time: 03/24/20 10:46 AM   Specimen: Nasopharyngeal Swab  Result Value Ref Range Status   SARS Coronavirus 2 by RT PCR POSITIVE (A) NEGATIVE Final    Comment: emailed L. Berdik RN 12:45 03/24/20 ([email or web address]) (NOTE) SARS-CoV-2 target nucleic acids are DETECTED.  SARS-CoV-2 RNA is generally detectable in upper respiratory specimens  during the acute phase of infection. Positive results are indicative of the presence of the identified virus, but do not rule out bacterial infection or co-infection with other pathogens not detected by the test. Clinical correlation with patient history and other diagnostic information is  necessary to determine patient infection status. The expected result is Negative.  Fact Sheet for Patients:  PinkCheek.be  Fact Sheet for Healthcare Providers: GravelBags.it  This test is not yet approved or cleared by the [place] FDA and  has been authorized for detection and/or diagnosis of SARS-CoV-2 by FDA under an Emergency Use Authorization (EUA).  This EUA will remain in effect (meaning this test can be used) for the duration of  the COVID-19  declaration under Section 564(b)(1) of the Act, 21 U.S.C. section 360bbb-3(b)(1), unless the authorization is terminated or revoked sooner.      Influenza A by PCR NEGATIVE NEGATIVE Final   Influenza B by PCR NEGATIVE NEGATIVE Final    Comment: (NOTE) The Xpert Xpress SARS-CoV-2/FLU/RSV assay is intended as an aid in  the diagnosis of influenza from  Nasopharyngeal swab specimens and  should not be used as a sole basis for treatment. Nasal washings and  aspirates are unacceptable for Xpert Xpress SARS-CoV-2/FLU/RSV  testing.  Fact Sheet for Patients: PinkCheek.be  Fact Sheet for Healthcare Providers: GravelBags.it  This test is not yet approved or cleared by the [place] FDA and  has been authorized for detection and/or diagnosis of SARS-CoV-2 by  FDA under an Emergency Use Authorization (EUA). This EUA will remain  in effect (meaning this test can be used) for the duration of the  Covid-19 declaration under Section 564(b)(1) of the Act, 21  U.S.C. section 360bbb-3(b)(1), unless the authorization is  terminated or revoked. Performed at [facility], [place] [address] [place], [place] [address]   Blood Culture (routine x 2)     Status: None (Preliminary result)   Collection Time: 03/24/20  7:20 PM   Specimen: BLOOD  Result Value Ref Range Status   Specimen Description BLOOD LEFT ANTECUBITAL  Final   Special Requests AEROBIC BOTTLE ONLY Blood Culture adequate volume  Final   Culture   Final    NO GROWTH 2 DAYS Performed at [facility], [place] [address], [place], [place] [address]    Report Status PENDING  Incomplete  Blood Culture (routine x 2)     Status: Abnormal   Collection Time: 03/24/20  7:30 PM   Specimen: BLOOD LEFT WRIST  Result Value Ref Range Status   Specimen Description BLOOD LEFT WRIST  Final   Special Requests AEROBIC BOTTLE ONLY Blood Culture adequate volume  Final   Culture  Setup Time   Final    GRAM POSITIVE COCCI AEROBIC BOTTLE ONLY CRITICAL RESULT CALLED TO, READ BACK BY AND VERIFIED WITH: PHARMD J MILLEN 03/25/20 AT 2024 SK    Culture (A)  Final    STAPHYLOCOCCUS EPIDERMIDIS THE SIGNIFICANCE OF ISOLATING THIS ORGANISM FROM A SINGLE SET OF BLOOD CULTURES WHEN MULTIPLE SETS ARE DRAWN IS UNCERTAIN. PLEASE NOTIFY THE MICROBIOLOGY  DEPARTMENT WITHIN ONE WEEK IF SPECIATION AND SENSITIVITIES ARE REQUIRED. Performed at [facility], [place] [address], [place], [place] [address]    Report Status 03/26/2020 FINAL  Final  Blood Culture ID Panel (Reflexed)     Status: Abnormal   Collection Time: 03/24/20  7:30 PM  Result Value Ref Range Status   Enterococcus faecalis NOT DETECTED NOT DETECTED Final   Enterococcus Faecium NOT DETECTED NOT DETECTED Final   Listeria monocytogenes NOT DETECTED NOT DETECTED Final   Staphylococcus species DETECTED (A) NOT DETECTED Final    Comment: CRITICAL RESULT CALLED TO, READ BACK BY AND VERIFIED WITH: PHARMD J MILLEN 03/25/20 AT 2024 SK  Staphylococcus aureus (BCID) NOT DETECTED NOT DETECTED Final   Staphylococcus epidermidis DETECTED (A) NOT DETECTED Final    Comment: CRITICAL RESULT CALLED TO, READ BACK BY AND VERIFIED WITH: PHARMD J MILLEN 03/25/20 AT 2024 SK    Staphylococcus lugdunensis NOT DETECTED NOT DETECTED Final   Streptococcus species NOT DETECTED NOT DETECTED Final   Streptococcus agalactiae NOT DETECTED NOT DETECTED Final   Streptococcus pneumoniae NOT DETECTED NOT DETECTED Final   Streptococcus pyogenes NOT DETECTED NOT DETECTED Final   A.calcoaceticus-baumannii NOT DETECTED NOT DETECTED Final   Bacteroides fragilis NOT DETECTED NOT DETECTED Final   Enterobacterales NOT DETECTED NOT DETECTED Final   Enterobacter cloacae complex NOT DETECTED NOT DETECTED Final   Escherichia coli NOT DETECTED NOT DETECTED Final   Klebsiella aerogenes NOT DETECTED NOT DETECTED Final   Klebsiella oxytoca NOT DETECTED NOT DETECTED Final   Klebsiella pneumoniae NOT DETECTED NOT DETECTED Final   Proteus species NOT DETECTED NOT DETECTED Final   Salmonella species NOT DETECTED NOT DETECTED Final   Serratia marcescens NOT DETECTED NOT DETECTED Final   Haemophilus influenzae NOT DETECTED NOT DETECTED Final   Neisseria meningitidis NOT DETECTED NOT DETECTED Final   Pseudomonas aeruginosa  NOT DETECTED NOT DETECTED Final   Stenotrophomonas maltophilia NOT DETECTED NOT DETECTED Final   Candida albicans NOT DETECTED NOT DETECTED Final   Candida auris NOT DETECTED NOT DETECTED Final   Candida glabrata NOT DETECTED NOT DETECTED Final   Candida krusei NOT DETECTED NOT DETECTED Final   Candida parapsilosis NOT DETECTED NOT DETECTED Final   Candida tropicalis NOT DETECTED NOT DETECTED Final   Cryptococcus neoformans/gattii NOT DETECTED NOT DETECTED Final   Methicillin resistance mecA/C NOT DETECTED NOT DETECTED Final    Comment: Performed at [facility], [address], [place], [place] [address]  Culture, blood (routine x 2)     Status: None (Preliminary result)   Collection Time: 03/25/20 10:02 PM   Specimen: BLOOD LEFT ARM  Result Value Ref Range Status   Specimen Description BLOOD LEFT ARM  Final   Special Requests   Final    BOTTLES DRAWN AEROBIC AND ANAEROBIC Blood Culture adequate volume   Culture   Final    NO GROWTH < 12 HOURS Performed at [facility], [place] [address] [place], [place] [address]    Report Status PENDING  Incomplete  Culture, blood (routine x 2)     Status: None (Preliminary result)   Collection Time: 03/25/20 10:03 PM   Specimen: BLOOD LEFT HAND  Result Value Ref Range Status   Specimen Description BLOOD LEFT HAND  Final   Special Requests   Final    BOTTLES DRAWN AEROBIC AND ANAEROBIC Blood Culture adequate volume   Culture   Final    NO GROWTH < 12 HOURS Performed at [facility], [place] [address], [place], [place] [address]    Report Status PENDING  Incomplete      Radiology Studies: VAS [place] LOWER EXTREMITY VENOUS (DVT)  Result Date: 03/26/2020  Lower Venous DVTStudy Indications: COVID-19 positive. Elevated d-dimer.  Comparison Study: No prior study Performing Technologist: Maudry Mayhew MHA, RDMS, RVT, RDCS  Examination Guidelines: A complete evaluation includes B-mode imaging, spectral Doppler, color Doppler, and  power Doppler as needed of all accessible portions of each vessel. Bilateral testing is considered an integral part of a complete examination. Limited examinations for reoccurring indications may be performed as noted. The reflux portion of the exam is performed with the patient in  reverse Trendelenburg.  +---------+---------------+---------+-----------+----------+--------------+  RIGHT     Compressibility Phasicity Spontaneity Properties Thrombus Aging  +---------+---------------+---------+-----------+----------+--------------+  CFV       Full            Yes       Yes                                    +---------+---------------+---------+-----------+----------+--------------+  SFJ       Full                                                             +---------+---------------+---------+-----------+----------+--------------+  FV Prox   Full                                                             +---------+---------------+---------+-----------+----------+--------------+  FV Mid    Full                                                             +---------+---------------+---------+-----------+----------+--------------+  FV Distal Full                                                             +---------+---------------+---------+-----------+----------+--------------+  PFV       Full                                                             +---------+---------------+---------+-----------+----------+--------------+  POP       Full            Yes       Yes                                    +---------+---------------+---------+-----------+----------+--------------+  PTV       Full                                                             +---------+---------------+---------+-----------+----------+--------------+  PERO      Full                                                             +---------+---------------+---------+-----------+----------+--------------+    +---------+---------------+---------+-----------+----------+--------------+  LEFT      Compressibility Phasicity Spontaneity Properties Thrombus Aging  +---------+---------------+---------+-----------+----------+--------------+  CFV       Full            Yes       Yes                                    +---------+---------------+---------+-----------+----------+--------------+  SFJ       Full                                                             +---------+---------------+---------+-----------+----------+--------------+  FV Prox   Full                                                             +---------+---------------+---------+-----------+----------+--------------+  FV Mid    Full                                                             +---------+---------------+---------+-----------+----------+--------------+  FV Distal Full                                                             +---------+---------------+---------+-----------+----------+--------------+  PFV       Full                                                             +---------+---------------+---------+-----------+----------+--------------+  POP       Full            Yes       Yes                                    +---------+---------------+---------+-----------+----------+--------------+  PTV       Full                                                             +---------+---------------+---------+-----------+----------+--------------+  PERO      Full                                                             +---------+---------------+---------+-----------+----------+--------------+  Summary: RIGHT: - There is no evidence of deep vein thrombosis in the lower extremity.  - No cystic structure found in the popliteal fossa.  LEFT: - There is no evidence of deep vein thrombosis in the lower extremity.  - No cystic structure found in the popliteal fossa.  *See table(s) above for measurements and observations.    Preliminary         LOS: 3 days   [facility] Pager on [email or web address]  03/27/2020, 9:26 AM

## 2020-03-27 NOTE — Evaluation (Signed)
Physical Therapy Evaluation Patient Details Name: Amanda Ross MRN: [id number] DOB: 07-Apr-1946 Today's Date: 03/27/2020   History of Present Illness  74 y.o. female with history of asthma, HTN, OSA, primary hypothyroidism and left thyroid nodule resented with myalgias, chills, intermittent cough) shortness of breath.  She tested positive for COVID-19 at her PCP office.  She apparently desaturated into the mid 80s and was sent over to the emergency department.  Chest x-ray did not show any significant findings.  CT angiogram did not show PE but did show multifocal patchy airspace opacities right greater than left.  Clinical Impression  Patient presents with decreased activity tolerance, decreased balance using RW for ambulation this session.  She lives with her spouse who can assist some.  Feel she should progress to home with follow up HHPT and PT to continue to follow acutely as well.    Follow Up Recommendations Home health PT;Supervision/Assistance - 24 hour    Equipment Recommendations  None recommended by PT    Recommendations for Other Services       Precautions / Restrictions Precautions Precautions: Fall Precaution Comments: watch O2      Mobility  Bed Mobility               General bed mobility comments: up in recliner  Transfers Overall transfer level: Needs assistance Equipment used: Rolling walker (2 wheeled) Transfers: Sit to/from Stand Sit to Stand: Min guard         General transfer comment: up from recliner pushing on armrests, assist for lines, safety  Ambulation/Gait Ambulation/Gait assistance: Supervision Gait Distance (Feet): 100 Feet Assistive device: Rolling walker (2 wheeled) Gait Pattern/deviations: Step-through pattern;Decreased stride length     General Gait Details: several stops along the way to check SpO2, smooth with RW  Stairs            Wheelchair Mobility    Modified Rankin (Stroke Patients Only)        Balance Overall balance assessment: Needs assistance   Sitting balance-Leahy Scale: Good     Standing balance support: Bilateral upper extremity supported   Standing balance comment: held the walker for support throughout                             Pertinent Vitals/Pain Pain Assessment: 0-10 Pain Score: 4  Pain Location: throat and head Pain Descriptors / Indicators: Aching;Sore Pain Intervention(s): Monitored during session    Home Living Family/patient expects to be discharged to:: Private residence Living Arrangements: Spouse/significant other Available Help at Discharge: Family Type of Home: House Home Access: Stairs to enter Entrance Stairs-Rails: Chemical engineer of Steps: 3 Home Layout: One level Home Equipment: Environmental consultant - 2 wheels;Shower seat      Prior Function Level of Independence: Independent               Hand Dominance   Dominant Hand: Right    Extremity/Trunk Assessment   Upper Extremity Assessment Upper Extremity Assessment: RUE deficits/detail RUE Deficits / Details: R UE elevation limited compared to L strength at least 3/5    Lower Extremity Assessment Lower Extremity Assessment: Generalized weakness       Communication   Communication: No difficulties  Cognition Arousal/Alertness: Awake/alert Behavior During Therapy: WFL for tasks assessed/performed Overall Cognitive Status: Within Functional Limits for tasks assessed  General Comments General comments (skin integrity, edema, etc.): SpO2 on RA to the door 71%, placed O2 @ 3LPM with SpO2 89-91%.    Exercises     Assessment/Plan    PT Assessment Patient needs continued PT services  PT Problem List Decreased strength;Decreased mobility;Decreased activity tolerance;Decreased knowledge of use of DME;Decreased balance;Cardiopulmonary status limiting activity       PT Treatment Interventions DME  instruction;Therapeutic activities;Gait training;Therapeutic exercise;Patient/family education;Balance training;Stair training;Functional mobility training    PT Goals (Current goals can be found in the Care Plan section)  Acute Rehab PT Goals Patient Stated Goal: to return to independent PT Goal Formulation: With patient Time For Goal Achievement: 04/10/20 Potential to Achieve Goals: Good    Frequency Min 3X/week   Barriers to discharge        Co-evaluation               AM-PAC PT "6 Clicks" Mobility  Outcome Measure Help needed turning from your back to your side while in a flat bed without using bedrails?: A Little Help needed moving from lying on your back to sitting on the side of a flat bed without using bedrails?: A Little Help needed moving to and from a bed to a chair (including a wheelchair)?: A Little Help needed standing up from a chair using your arms (e.g., wheelchair or bedside chair)?: A Little Help needed to walk in hospital room?: None Help needed climbing 3-5 steps with a railing? : A Little 6 Click Score: 19    End of Session Equipment Utilized During Treatment: Oxygen Activity Tolerance: Patient tolerated treatment well Patient left: in chair;with call bell/phone within reach   PT Visit Diagnosis: Other abnormalities of gait and mobility (R26.89);Muscle weakness (generalized) (M62.81)    Time: 1771-1657 PT Time Calculation (min) (ACUTE ONLY): 35 min   Charges:   PT Evaluation $PT Eval Moderate Complexity: 1 Mod PT Treatments $Gait Training: 8-22 mins        Amanda Ross, PT Acute Rehabilitation Services XUXYB:[phone] Office:[phone] 03/27/2020   [name] 03/27/2020, 2:32 PM

## 2020-03-27 NOTE — Progress Notes (Signed)
SATURATION QUALIFICATIONS: (This note is used to comply with regulatory documentation for home oxygen)  Patient Saturations on Room Air at Rest = 88%  Patient Saturations on Room Air while Ambulating = 72%  Patient Saturations on 3 Liters of oxygen while Ambulating = 89%  Please briefly explain why patient needs home oxygen: Patient hypoxic with ambulation on RA.  Will need home [place] BBHQS:[phone] Office:[phone] 03/27/2020

## 2020-03-28 ENCOUNTER — Inpatient Hospital Stay (HOSPITAL_COMMUNITY): Payer: Medicare Other

## 2020-03-28 DIAGNOSIS — U071 COVID-19: Secondary | ICD-10-CM | POA: Diagnosis not present

## 2020-03-28 DIAGNOSIS — J9601 Acute respiratory failure with hypoxia: Secondary | ICD-10-CM | POA: Diagnosis not present

## 2020-03-28 LAB — CBC WITH DIFFERENTIAL/PLATELET
Abs Immature Granulocytes: 0.05 K/uL (ref 0.00–0.07)
Basophils Absolute: 0 K/uL (ref 0.0–0.1)
Basophils Relative: 0 %
Eosinophils Absolute: 0 K/uL (ref 0.0–0.5)
Eosinophils Relative: 0 %
HCT: 38 % (ref 36.0–46.0)
Hemoglobin: 12.9 g/dL (ref 12.0–15.0)
Immature Granulocytes: 1 %
Lymphocytes Relative: 6 %
Lymphs Abs: 0.6 K/uL — ABNORMAL LOW (ref 0.7–4.0)
MCH: 30.2 pg (ref 26.0–34.0)
MCHC: 33.9 g/dL (ref 30.0–36.0)
MCV: 89 fL (ref 80.0–100.0)
Monocytes Absolute: 0.6 K/uL (ref 0.1–1.0)
Monocytes Relative: 6 %
Neutro Abs: 8.4 K/uL — ABNORMAL HIGH (ref 1.7–7.7)
Neutrophils Relative %: 87 %
Platelets: 231 K/uL (ref 150–400)
RBC: 4.27 MIL/uL (ref 3.87–5.11)
RDW: 13.8 % (ref 11.5–15.5)
WBC: 9.5 K/uL (ref 4.0–10.5)
nRBC: 0 % (ref 0.0–0.2)

## 2020-03-28 LAB — COMPREHENSIVE METABOLIC PANEL
ALT: 23 U/L (ref 0–44)
AST: 25 U/L (ref 15–41)
Albumin: 2.6 g/dL — ABNORMAL LOW (ref 3.5–5.0)
Alkaline Phosphatase: 61 U/L (ref 38–126)
Anion gap: 9 (ref 5–15)
BUN: 26 mg/dL — ABNORMAL HIGH (ref 8–23)
CO2: 25 mmol/L (ref 22–32)
Calcium: 10.5 mg/dL — ABNORMAL HIGH (ref 8.9–10.3)
Chloride: 105 mmol/L (ref 98–111)
Creatinine, Ser: 1.43 mg/dL — ABNORMAL HIGH (ref 0.44–1.00)
GFR, Estimated: 36 mL/min — ABNORMAL LOW (ref >60)
Glucose, Bld: 154 mg/dL — ABNORMAL HIGH (ref 70–99)
Potassium: 4.4 mmol/L (ref 3.5–5.1)
Sodium: 139 mmol/L (ref 135–145)
Total Bilirubin: 0.1 mg/dL — ABNORMAL LOW (ref 0.3–1.2)
Total Protein: 5.9 g/dL — ABNORMAL LOW (ref 6.5–8.1)

## 2020-03-28 LAB — C-REACTIVE PROTEIN: CRP: 2.9 mg/dL — ABNORMAL HIGH (ref <1.0)

## 2020-03-28 LAB — BRAIN NATRIURETIC PEPTIDE: B Natriuretic Peptide: 116.2 pg/mL — ABNORMAL HIGH (ref 0.0–100.0)

## 2020-03-28 LAB — D-DIMER, QUANTITATIVE: D-Dimer, Quant: 0.51 ug{FEU}/mL — ABNORMAL HIGH (ref 0.00–0.50)

## 2020-03-28 LAB — PROCALCITONIN: Procalcitonin: <0.1 ng/mL

## 2020-03-28 LAB — MAGNESIUM: Magnesium: 2.1 mg/dL (ref 1.7–2.4)

## 2020-03-28 LAB — GLUCOSE, CAPILLARY: Glucose-Capillary: 153 mg/dL — ABNORMAL HIGH (ref 70–99)

## 2020-03-28 MED ORDER — AMLODIPINE BESYLATE 10 MG PO TABS
10.0000 mg | ORAL_TABLET | Freq: Every day | ORAL | Status: DC
  Administered 2020-03-28 – 2020-04-02 (×6): 10 mg via ORAL
  Filled 2020-03-28 (×6): qty 1

## 2020-03-28 MED ORDER — METHYLPREDNISOLONE SODIUM SUCC 40 MG IJ SOLR
40.0000 mg | Freq: Two times a day (BID) | INTRAMUSCULAR | Status: DC
  Administered 2020-03-28 – 2020-03-29 (×2): 40 mg via INTRAVENOUS
  Filled 2020-03-28 (×2): qty 1

## 2020-03-28 MED ORDER — FUROSEMIDE 10 MG/ML IJ SOLN
40.0000 mg | Freq: Once | INTRAMUSCULAR | Status: AC
  Administered 2020-03-28: 40 mg via INTRAVENOUS
  Filled 2020-03-28: qty 4

## 2020-03-28 NOTE — Progress Notes (Signed)
SATURATION QUALIFICATIONS: (This note is used to comply with regulatory documentation for home oxygen)  Patient Saturations on Room Air at Rest = 79%  Patient Saturations on Room Air while Ambulating = 72%  Patient Saturations on 6L Liters of oxygen while Ambulating = 84%  Please briefly explain why patient needs home oxygen:  Patient desats into 70s while at rest on room air and desats further during standing/ambulation while on room air.

## 2020-03-28 NOTE — Progress Notes (Signed)
PROGRESS NOTE  Amanda Ross RJJ:[id number] DOB: May 16, 1946 DOA: 03/24/2020  PCP: [name], MD  Brief History/Interval Summary: 74 y.o. female with history of asthma, HTN, OSA, primary hypothyroidism and left thyroid nodule resented with myalgias, chills, intermittent cough) shortness of breath.  She tested positive for COVID-19 at her PCP office.  She apparently desaturated into the mid 80s and was sent over to the emergency department.  Chest x-ray did not show any significant findings.  CT angiogram did not show PE but did show multifocal patchy airspace opacities right greater than left.    Reason for Visit: Pneumonia due to COVID-19.  Acute respiratory failure with hypoxia.  Consultants: None  Procedures: None  Antibiotics: Anti-infectives (From admission, onward)   Start     Dose/Rate Route Frequency Ordered Stop   03/25/20 1000  remdesivir 100 mg in sodium chloride 0.9 % 100 mL IVPB       "Followed by" Linked Group Details   100 mg 200 mL/hr over 30 Minutes Intravenous Daily 03/24/20 1533 03/28/20 0858   03/24/20 1545  remdesivir 200 mg in sodium chloride 0.9% 250 mL IVPB       "Followed by" Linked Group Details   200 mg 580 mL/hr over 30 Minutes Intravenous Once 03/24/20 1533 03/24/20 1940      Subjective/Interval History: Patient in bed, appears comfortable, denies any headache, no fever, no chest pain or pressure, mild but improved shortness of breath , no abdominal pain. No focal weakness.   Assessment/Plan:  Acute Hypoxic Resp. Failure/Pneumonia due to COVID-19 - she is fully vaccinated and fortunately seems to have a mild breakthrough disease, CTA negative, currently placed on steroids and Remdesivir and seems to be responding well to treatment.  Encouraged to sit up in chair use I-S and flutter valve for pulmonary toiletry, clinically stable, finishing her REMDESIVIR, continue to taper steroids, monitor her progress likely home discharge in 1 to 2 days on  some home oxygen.    Recent Labs  Lab 03/24/20 1013 03/24/20 1039 03/24/20 1046 03/24/20 1152 03/24/20 1936 03/25/20 0300 03/26/20 0210 03/26/20 0904 03/27/20 0228 03/27/20 0229 03/28/20 0438  WBC  --  4.0  --   --   --  2.7* 6.5  --  7.3  --  9.5  CRP  --   --   --   --  17.0* 18.5* 13.1*  --  6.4*  --  2.9*  DDIMER  --   --   --  1.47*  --  1.02* 0.83*  --  0.62*  --  0.51*  BNP  --   --   --   --   --   --   --   --   --  97.3 116.2*  PROCALCITON  --   --   --   --  0.16  --   --  0.14 0.10  --  <0.10  AST  --   --   --   --   --  28 27  --  28  --  25  ALT  --   --   --   --   --  19 20  --  21  --  23  ALKPHOS  --   --   --   --   --  58 54  --  53  --  61  BILITOT  --   --   --   --   --  0.6 0.7  --  0.3  --  0.1*  ALBUMIN  --   --   --   --   --  2.8* 2.8*  --  2.6*  --  2.6*  SARSCOV2NAA Detected*  --  POSITIVE*  --   --   --   --   --   --   --   --       1 out of 2 staph epidermis positive blood culture.  Likely contaminant.  Monitor.  Acute kidney injury on chronic kidney disease stage IIIb -   her baseline creatinine is around 1.4, AKI likely due to ATN caused by acute infection and dehydration, improved, continue to monitor  Obstructive sleep apnea - Continue with oxygen.  History of asthma - Steroids and inhalers as above.  Essential hypertension - Continue metoprolol, have added Norvasc for better control.    History of overactive bladder - Monitor. Continue oxybutynin.  Mild Thrombocytopenia - Due to viral infection.  Stable.  Lab Results  Component Value Date   PLT 231 03/28/2020     Morbid obesity - BMI  Of > 39 Follow with PCP for weight loss.    DVT Prophylaxis: Lovenox Code Status: Full code Family Communication: Discussed with patient.  Called her husband [name] [phone] -03/26/2020 at 9:45 AM, full mailbox, called on 03/28/2020 at 9:21 AM full mailbox, tried second #[phone] and updated. Disposition Plan: Hopefully return home when  improved  Status is: Inpatient  Remains inpatient appropriate because:IV treatments appropriate due to intensity of illness or inability to take PO and Inpatient level of care appropriate due to severity of illness   Dispo: The patient is from: Home              Anticipated d/c is to: Home              Anticipated d/c date is: 2 days              Patient currently is not medically stable to d/c.      Medications:  Scheduled: . amLODipine  10 mg Oral Daily  . vitamin C  500 mg Oral Daily  . aspirin EC  81 mg Oral Daily  . cholecalciferol  2,000 Units Oral Daily  . enoxaparin (LOVENOX) injection  40 mg Subcutaneous Q24H  . furosemide  40 mg Intravenous Once  . Ipratropium-Albuterol  1 puff Inhalation Q6H  . methylPREDNISolone (SOLU-MEDROL) injection  40 mg Intravenous Q12H  . metoprolol tartrate  12.5 mg Oral BID  . oxybutynin  10 mg Oral QHS  . pantoprazole  40 mg Oral Daily  . traZODone  50 mg Oral QHS  . zinc sulfate  220 mg Oral Daily   Continuous:  IHK:VQQVZDGLOVFIE, baclofen, bisacodyl, chlorpheniramine-HYDROcodone, guaiFENesin-dextromethorphan, hydroxypropyl methylcellulose / hypromellose, magnesium citrate, [DISCONTINUED] ondansetron **OR** ondansetron (ZOFRAN) IV, polyethylene glycol, zolpidem   Objective:  Vital Signs  Vitals:   03/27/20 0706 03/27/20 1637 03/27/20 2252 03/28/20 0726  BP: 135/76 137/79 (!) 157/92 (!) 159/93  Pulse: 71 74 74 80  Resp: 17 17 20 18   Temp: 98.1 F (36.7 C) 98 F (36.7 C) 98 F (36.7 C) 98.3 F (36.8 C)  TempSrc: Oral Oral  Oral  SpO2: 94% (!) 86% 96% 91%  Weight:      Height:       No intake or output data in the 24 hours ending 03/28/20 0919 Filed Weights   03/24/20 1033  Weight: 102.1 kg    EXAM  Awake Alert, No new  F.N deficits, Normal affect [place].AT,PERRAL Supple Neck,No JVD, No cervical lymphadenopathy appriciated.  Symmetrical Chest wall movement, Good air movement bilaterally, few rales RRR,No Gallops, Rubs  or new Murmurs, No Parasternal Heave +ve B.Sounds, Abd Soft, No tenderness, No organomegaly appriciated, No rebound - guarding or rigidity. No Cyanosis, Clubbing or edema, No new Rash or bruise    Lab Results:  Data Reviewed: I have personally reviewed following labs and imaging studies  Recent Labs  Lab 03/24/20 1039 03/25/20 0300 03/26/20 0210 03/27/20 0228 03/28/20 0438  WBC 4.0 2.7* 6.5 7.3 9.5  HGB 13.2 12.1 12.1 12.9 12.9  HCT 40.9 36.5 36.3 38.0 38.0  PLT 126* PLATELET CLUMPS NOTED ON SMEAR, COUNT APPEARS ADEQUATE 146* 165 231  MCV 90.3 88.0 87.1 87.4 89.0  MCH 29.1 29.2 29.0 29.7 30.2  MCHC 32.3 33.2 33.3 33.9 33.9  RDW 13.7 13.5 13.5 13.7 13.8  LYMPHSABS  --  0.2* 0.5* 0.5* 0.6*  MONOABS  --  0.1 0.3 0.3 0.6  EOSABS  --  0.0 0.0 0.0 0.0  BASOSABS  --  0.0 0.0 0.0 0.0    Recent Labs  Lab 03/24/20 1039 03/24/20 1152 03/24/20 1936 03/25/20 0300 03/26/20 0210 03/26/20 0904 03/27/20 0228 03/27/20 0229 03/28/20 0438  NA   < >  --  138 138 138  --  139  --  139  K   < >  --  4.0 4.0 4.0  --  4.2  --  4.4  CL   < >  --  103 105 105  --  105  --  105  CO2   < >  --  20* 21* 22  --  25  --  25  GLUCOSE   < >  --  97 148* 153*  --  152*  --  154*  BUN   < >  --  23 23 25*  --  25*  --  26*  CREATININE   < >  --  1.61* 1.55* 1.55*  --  1.30*  --  1.43*  CALCIUM   < >  --  8.9 8.8* 9.2  --  9.9  --  10.5*  AST  --   --   --  28 27  --  28  --  25  ALT  --   --   --  19 20  --  21  --  23  ALKPHOS  --   --   --  58 54  --  53  --  61  BILITOT  --   --   --  0.6 0.7  --  0.3  --  0.1*  ALBUMIN  --   --   --  2.8* 2.8*  --  2.6*  --  2.6*  MG  --   --   --   --   --   --  2.0  --  2.1  CRP  --   --  17.0* 18.5* 13.1*  --  6.4*  --  2.9*  DDIMER  --  1.47*  --  1.02* 0.83*  --  0.62*  --  0.51*  PROCALCITON  --   --  0.16  --   --  0.14 0.10  --  <0.10  BNP  --   --   --   --   --   --   --  97.3 116.2*   < > = values in this interval not displayed.  Recent  Labs  Lab 03/24/20 1013 03/24/20 1039 03/24/20 1046 03/24/20 1152 03/24/20 1936 03/25/20 0300 03/26/20 0210 03/26/20 0904 03/27/20 0228 03/27/20 0229 03/28/20 0438  WBC  --  4.0  --   --   --  2.7* 6.5  --  7.3  --  9.5  CRP  --   --   --   --  17.0* 18.5* 13.1*  --  6.4*  --  2.9*  DDIMER  --   --   --  1.47*  --  1.02* 0.83*  --  0.62*  --  0.51*  BNP  --   --   --   --   --   --   --   --   --  97.3 116.2*  PROCALCITON  --   --   --   --  0.16  --   --  0.14 0.10  --  <0.10  AST  --   --   --   --   --  28 27  --  28  --  25  ALT  --   --   --   --   --  19 20  --  21  --  23  ALKPHOS  --   --   --   --   --  58 54  --  53  --  61  BILITOT  --   --   --   --   --  0.6 0.7  --  0.3  --  0.1*  ALBUMIN  --   --   --   --   --  2.8* 2.8*  --  2.6*  --  2.6*  SARSCOV2NAA Detected*  --  POSITIVE*  --   --   --   --   --   --   --   --         Recent Results (from the past 240 hour(s))  Novel Coronavirus, NAA ([facility])     Status: Abnormal   Collection Time: 03/24/20 10:13 AM   Specimen: Nasopharyngeal(NP) swabs in vial transport medium  Result Value Ref Range Status   SARS-CoV-2, NAA Detected (A) Not Detected Final    Comment: Patients who have a positive COVID-19 test result may now have treatment options. Treatment options are available for patients with mild to moderate symptoms and for hospitalized patients. Visit our website at [email or web address] for resources and information. This nucleic acid amplification test was developed and its performance characteristics determined by [employer]. Nucleic acid amplification tests include RT-PCR and TMA. This test has not been FDA cleared or approved. This test has been authorized by FDA under an Emergency Use Authorization (EUA). This test is only authorized for the duration of time the declaration that circumstances exist justifying the authorization of the emergency use of in vitro diagnostic tests for  detection of SARS-CoV-2 virus and/or diagnosis of COVID-19 infection under section 564(b)(1) of the Act, 21 U.S.C. 283TDV-7(O) (1), unless the authorization is terminated or revoked sooner. When diagnostic testing is negativ e, the possibility of a false negative result should be considered in the context of a patient's recent exposures and the presence of clinical signs and symptoms consistent with COVID-19. An individual without symptoms of COVID-19 and who is not shedding SARS-CoV-2 virus would expect to have a negative (not detected) result in this assay.   SARS-COV-2, NAA 2 DAY TAT     Status: None   Collection  Time: 03/24/20 10:13 AM  Result Value Ref Range Status   SARS-CoV-2, NAA 2 DAY TAT Performed  Final  Respiratory Panel by RT PCR (Flu A&B, Covid) - Nasopharyngeal Swab     Status: Abnormal   Collection Time: 03/24/20 10:46 AM   Specimen: Nasopharyngeal Swab  Result Value Ref Range Status   SARS Coronavirus 2 by RT PCR POSITIVE (A) NEGATIVE Final    Comment: emailed L. Berdik RN 12:45 03/24/20 ([email or web address]) (NOTE) SARS-CoV-2 target nucleic acids are DETECTED.  SARS-CoV-2 RNA is generally detectable in upper respiratory specimens  during the acute phase of infection. Positive results are indicative of the presence of the identified virus, but do not rule out bacterial infection or co-infection with other pathogens not detected by the test. Clinical correlation with patient history and other diagnostic information is necessary to determine patient infection status. The expected result is Negative.  Fact Sheet for Patients:  PinkCheek.be  Fact Sheet for Healthcare Providers: GravelBags.it  This test is not yet approved or cleared by the [place] FDA and  has been authorized for detection and/or diagnosis of SARS-CoV-2 by FDA under an Emergency Use Authorization (EUA).  This EUA will remain in effect (meaning  this test can be used) for the duration of  the COVID-19  declaration under Section 564(b)(1) of the Act, 21 U.S.C. section 360bbb-3(b)(1), unless the authorization is terminated or revoked sooner.      Influenza A by PCR NEGATIVE NEGATIVE Final   Influenza B by PCR NEGATIVE NEGATIVE Final    Comment: (NOTE) The Xpert Xpress SARS-CoV-2/FLU/RSV assay is intended as an aid in  the diagnosis of influenza from Nasopharyngeal swab specimens and  should not be used as a sole basis for treatment. Nasal washings and  aspirates are unacceptable for Xpert Xpress SARS-CoV-2/FLU/RSV  testing.  Fact Sheet for Patients: PinkCheek.be  Fact Sheet for Healthcare Providers: GravelBags.it  This test is not yet approved or cleared by the [place] FDA and  has been authorized for detection and/or diagnosis of SARS-CoV-2 by  FDA under an Emergency Use Authorization (EUA). This EUA will remain  in effect (meaning this test can be used) for the duration of the  Covid-19 declaration under Section 564(b)(1) of the Act, 21  U.S.C. section 360bbb-3(b)(1), unless the authorization is  terminated or revoked. Performed at [facility], [place] [address], [place], [place] [address]   Blood Culture (routine x 2)     Status: None (Preliminary result)   Collection Time: 03/24/20  7:20 PM   Specimen: BLOOD  Result Value Ref Range Status   Specimen Description BLOOD LEFT ANTECUBITAL  Final   Special Requests AEROBIC BOTTLE ONLY Blood Culture adequate volume  Final   Culture   Final    NO GROWTH 4 DAYS Performed at [facility], [place] [address], [place], [place] [address]    Report Status PENDING  Incomplete  Blood Culture (routine x 2)     Status: Abnormal   Collection Time: 03/24/20  7:30 PM   Specimen: BLOOD LEFT WRIST  Result Value Ref Range Status   Specimen Description BLOOD LEFT WRIST  Final   Special Requests AEROBIC BOTTLE ONLY  Blood Culture adequate volume  Final   Culture  Setup Time   Final    GRAM POSITIVE COCCI AEROBIC BOTTLE ONLY CRITICAL RESULT CALLED TO, READ BACK BY AND VERIFIED WITH: PHARMD J MILLEN 03/25/20 AT 2024 SK    Culture (A)  Final  STAPHYLOCOCCUS EPIDERMIDIS THE SIGNIFICANCE OF ISOLATING THIS ORGANISM FROM A SINGLE SET OF BLOOD CULTURES WHEN MULTIPLE SETS ARE DRAWN IS UNCERTAIN. PLEASE NOTIFY THE MICROBIOLOGY DEPARTMENT WITHIN ONE WEEK IF SPECIATION AND SENSITIVITIES ARE REQUIRED. Performed at [facility], [place] [address] [place], [name] [address]    Report Status 03/26/2020 FINAL  Final  Blood Culture ID Panel (Reflexed)     Status: Abnormal   Collection Time: 03/24/20  7:30 PM  Result Value Ref Range Status   Enterococcus faecalis NOT DETECTED NOT DETECTED Final   Enterococcus Faecium NOT DETECTED NOT DETECTED Final   Listeria monocytogenes NOT DETECTED NOT DETECTED Final   Staphylococcus species DETECTED (A) NOT DETECTED Final    Comment: CRITICAL RESULT CALLED TO, READ BACK BY AND VERIFIED WITH: PHARMD J MILLEN 03/25/20 AT 2024 SK    Staphylococcus aureus (BCID) NOT DETECTED NOT DETECTED Final   Staphylococcus epidermidis DETECTED (A) NOT DETECTED Final    Comment: CRITICAL RESULT CALLED TO, READ BACK BY AND VERIFIED WITH: PHARMD J MILLEN 03/25/20 AT 2024 SK    Staphylococcus lugdunensis NOT DETECTED NOT DETECTED Final   Streptococcus species NOT DETECTED NOT DETECTED Final   Streptococcus agalactiae NOT DETECTED NOT DETECTED Final   Streptococcus pneumoniae NOT DETECTED NOT DETECTED Final   Streptococcus pyogenes NOT DETECTED NOT DETECTED Final   A.calcoaceticus-baumannii NOT DETECTED NOT DETECTED Final   Bacteroides fragilis NOT DETECTED NOT DETECTED Final   Enterobacterales NOT DETECTED NOT DETECTED Final   Enterobacter cloacae complex NOT DETECTED NOT DETECTED Final   Escherichia coli NOT DETECTED NOT DETECTED Final   Klebsiella aerogenes NOT DETECTED NOT DETECTED  Final   Klebsiella oxytoca NOT DETECTED NOT DETECTED Final   Klebsiella pneumoniae NOT DETECTED NOT DETECTED Final   Proteus species NOT DETECTED NOT DETECTED Final   Salmonella species NOT DETECTED NOT DETECTED Final   Serratia marcescens NOT DETECTED NOT DETECTED Final   Haemophilus influenzae NOT DETECTED NOT DETECTED Final   Neisseria meningitidis NOT DETECTED NOT DETECTED Final   Pseudomonas aeruginosa NOT DETECTED NOT DETECTED Final   Stenotrophomonas maltophilia NOT DETECTED NOT DETECTED Final   Candida albicans NOT DETECTED NOT DETECTED Final   Candida auris NOT DETECTED NOT DETECTED Final   Candida glabrata NOT DETECTED NOT DETECTED Final   Candida krusei NOT DETECTED NOT DETECTED Final   Candida parapsilosis NOT DETECTED NOT DETECTED Final   Candida tropicalis NOT DETECTED NOT DETECTED Final   Cryptococcus neoformans/gattii NOT DETECTED NOT DETECTED Final   Methicillin resistance mecA/C NOT DETECTED NOT DETECTED Final    Comment: Performed at [facility], [address], [place], [place] [address]  Culture, blood (routine x 2)     Status: None (Preliminary result)   Collection Time: 03/25/20 10:02 PM   Specimen: BLOOD LEFT ARM  Result Value Ref Range Status   Specimen Description BLOOD LEFT ARM  Final   Special Requests   Final    BOTTLES DRAWN AEROBIC AND ANAEROBIC Blood Culture adequate volume   Culture   Final    NO GROWTH 3 DAYS Performed at [facility], [place] [address], [place], [place] [address]    Report Status PENDING  Incomplete  Culture, blood (routine x 2)     Status: None (Preliminary result)   Collection Time: 03/25/20 10:03 PM   Specimen: BLOOD LEFT HAND  Result Value Ref Range Status   Specimen Description BLOOD LEFT HAND  Final   Special Requests   Final  BOTTLES DRAWN AEROBIC AND ANAEROBIC Blood Culture adequate volume   Culture   Final    NO GROWTH 3 DAYS Performed at [facility], [place] [address] [place], [place] [address]     Report Status PENDING  Incomplete      Radiology Studies: VAS [place] LOWER EXTREMITY VENOUS (DVT)  Result Date: 03/27/2020  Lower Venous DVTStudy Indications: COVID-19 positive. Elevated d-dimer.  Comparison Study: No prior study Performing Technologist: Maudry Mayhew MHA, RDMS, RVT, RDCS  Examination Guidelines: A complete evaluation includes B-mode imaging, spectral Doppler, color Doppler, and power Doppler as needed of all accessible portions of each vessel. Bilateral testing is considered an integral part of a complete examination. Limited examinations for reoccurring indications may be performed as noted. The reflux portion of the exam is performed with the patient in reverse Trendelenburg.  +---------+---------------+---------+-----------+----------+--------------+ RIGHT    CompressibilityPhasicitySpontaneityPropertiesThrombus Aging +---------+---------------+---------+-----------+----------+--------------+ CFV      Full           Yes      Yes                                 +---------+---------------+---------+-----------+----------+--------------+ SFJ      Full                                                        +---------+---------------+---------+-----------+----------+--------------+ FV Prox  Full                                                        +---------+---------------+---------+-----------+----------+--------------+ FV Mid   Full                                                        +---------+---------------+---------+-----------+----------+--------------+ FV DistalFull                                                        +---------+---------------+---------+-----------+----------+--------------+ PFV      Full                                                        +---------+---------------+---------+-----------+----------+--------------+ POP      Full           Yes      Yes                                  +---------+---------------+---------+-----------+----------+--------------+ PTV      Full                                                        +---------+---------------+---------+-----------+----------+--------------+  PERO     Full                                                        +---------+---------------+---------+-----------+----------+--------------+   +---------+---------------+---------+-----------+----------+--------------+ LEFT     CompressibilityPhasicitySpontaneityPropertiesThrombus Aging +---------+---------------+---------+-----------+----------+--------------+ CFV      Full           Yes      Yes                                 +---------+---------------+---------+-----------+----------+--------------+ SFJ      Full                                                        +---------+---------------+---------+-----------+----------+--------------+ FV Prox  Full                                                        +---------+---------------+---------+-----------+----------+--------------+ FV Mid   Full                                                        +---------+---------------+---------+-----------+----------+--------------+ FV DistalFull                                                        +---------+---------------+---------+-----------+----------+--------------+ PFV      Full                                                        +---------+---------------+---------+-----------+----------+--------------+ POP      Full           Yes      Yes                                 +---------+---------------+---------+-----------+----------+--------------+ PTV      Full                                                        +---------+---------------+---------+-----------+----------+--------------+ PERO     Full                                                         +---------+---------------+---------+-----------+----------+--------------+  Summary: RIGHT: - There is no evidence of deep vein thrombosis in the lower extremity.  - No cystic structure found in the popliteal fossa.  LEFT: - There is no evidence of deep vein thrombosis in the lower extremity.  - No cystic structure found in the popliteal fossa.  *See table(s) above for measurements and observations. Electronically signed by Servando Snare MD on 03/27/2020 at 11:33:24 AM.    Final        LOS: 4 days   [facility] Pager on [email or web address]  03/28/2020, 9:19 AM

## 2020-03-28 NOTE — Evaluation (Signed)
 Occupational Therapy Evaluation Patient Details Name: Amanda Ross MRN: [id number] DOB: 10/05/1945 Today's Date: 03/28/2020    History of Present Illness 74 y.o. female with history of asthma, HTN, OSA, primary hypothyroidism and left thyroid nodule resented with myalgias, chills, intermittent cough) shortness of breath.  She tested positive for COVID-19 at her PCP office.  She apparently desaturated into the mid 80s and was sent over to the emergency department.  Chest x-ray did not show any significant findings.  CT angiogram did not show PE but did show multifocal patchy airspace opacities right greater than left.   Clinical Impression   PTA, pt lives with husband and reports Independence with all ADLs, IADLs and mobility without use of AD. Pt presents now with deficits in cardiopulmonary tolerance. Pt received standing at sink completing ADLs on 5 L O2 (with extension line) and SpO2 ranging in the 80s. Lowest desat noted at 82% with pt endorsing SOB. Pt requires overall no more than Supervision for mobility and ADLs without AD. Educated on energy conservation strategies to implement during ADLs/IADLs at home with pt verbalizing understanding of education. Plan to address tub transfers during next session as pt reports difficulty lifting feet high enough at home.     Follow Up Recommendations  No OT follow up;Supervision - Intermittent (assistance with tub transfers as needed)    Equipment Recommendations  None recommended by OT (appears to have all needed DME at home, declines tub bench)    Recommendations for Other Services       Precautions / Restrictions Precautions Precautions: Fall Precaution Comments: watch O2 Restrictions Weight Bearing Restrictions: No      Mobility Bed Mobility               General bed mobility comments: standing at sink on entry  Transfers Overall transfer level: Needs assistance Equipment used: None Transfers: Sit to/from Stand Sit to  Stand: Modified independent (Device/Increase time)         General transfer comment: No assist needed for sit to stand transfers, supervision for safety with mobility     Balance Overall balance assessment: Mild deficits observed, not formally tested                                         ADL either performed or assessed with clinical judgement   ADL Overall ADL's : Needs assistance/impaired Eating/Feeding: Independent;Sitting   Grooming: Modified independent;Standing;Oral care Grooming Details (indicate cue type and reason): Modified Independent for denture care standing at sink on OT entry. educated on use of chair at sink due to SOB during activity.  Upper Body Bathing: Independent;Sitting   Lower Body Bathing: Supervison/ safety;Sit to/from stand   Upper Body Dressing : Independent;Sitting   Lower Body Dressing: Supervision/safety;Sit to/from stand Lower Body Dressing Details (indicate cue type and reason): Educated on bringing feet to self for energy conservation strategies rather than bending due to pt tendency to hold breath Toilet Transfer: Supervision/safety;Ambulation;Regular Glass blower/designer Details (indicate cue type and reason): Supervision for safety without AD. No major LOB or unsteadiness noted but pt reaching out for support intermittently  Toileting- Clothing Manipulation and Hygiene: Modified independent;Sit to/from stand;Sitting/lateral lean   Tub/ Shower Transfer: Min guard;Tub transfer Tub/Shower Transfer Details (indicate cue type and reason): Pt reports hx of difficulty stepping over tub, educated on tub bench though pt concerned over water getting on floor. Educated for  husband to be present and assist with transfers Functional mobility during ADLs: Supervision/safety General ADL Comments: Pt limited by decreased cardiopulmonary tolerance, need for O2 and SOB during activities     Vision Baseline Vision/History: Wears  glasses Wears Glasses: At all times Patient Visual Report: No change from baseline Vision Assessment?: No apparent visual deficits     Perception     Praxis      Pertinent Vitals/Pain Pain Assessment: No/denies pain Pain Intervention(s): Monitored during session     Hand Dominance Right   Extremity/Trunk Assessment Upper Extremity Assessment Upper Extremity Assessment: Generalized weakness   Lower Extremity Assessment Lower Extremity Assessment: Defer to PT evaluation   Cervical / Trunk Assessment Cervical / Trunk Assessment: Normal   Communication Communication Communication: No difficulties   Cognition Arousal/Alertness: Awake/alert Behavior During Therapy: WFL for tasks assessed/performed Overall Cognitive Status: Within Functional Limits for tasks assessed                                     General Comments  Pt received on 5 L O2 (with extension line), sats at 82% at lowest during ADLs standing at sink though difficulty obtaining good pleth. Pt 88% at rest after activity, cued for pursed lip breathing, reinforced flutter valve/IS use.     Exercises     Shoulder Instructions      Home Living Family/patient expects to be discharged to:: Private residence Living Arrangements: Spouse/significant other Available Help at Discharge: Family Type of Home: House Home Access: Stairs to enter Technical  of Steps: 3 Entrance Stairs-Rails: Left;Right Home Layout: One level     Bathroom Shower/Tub: Teacher, early years/pre: [place]: Environmental consultant - 2 wheels;Shower seat;Bedside commode;Cane - quad          Prior Functioning/Environment Level of Independence: Independent        Comments: Independent in all ADLs, IADLs and mobility. No use of AD        OT Problem List: Decreased activity tolerance;Cardiopulmonary status limiting activity      OT Treatment/Interventions: Self-care/ADL training;Therapeutic  exercise;Energy conservation;DME and/or AE instruction;Therapeutic activities;Patient/family education    OT Goals(Current goals can be found in the care plan section) Acute Rehab OT Goals Patient Stated Goal: go home today OT Goal Formulation: With patient Time For Goal Achievement: 04/11/20 Potential to Achieve Goals: Good ADL Goals Pt Will Transfer to Toilet: with modified independence;ambulating;regular height toilet Pt Will Perform Tub/Shower Transfer: Tub transfer;with modified independence;ambulating;shower seat Additional ADL Goal #1: Pt to demonstrate implementation of at least 2 energy conservation strategies during ADLs/IADLs Additional ADL Goal #2: Pt to demonstrate ability to complete ADL/IADL tasks >5-8 minutes without rest break in order to improve endurance  OT Frequency: Min 2X/week   Barriers to D/C:            Co-evaluation              AM-PAC OT "6 Clicks" Daily Activity     Outcome Measure Help from another person eating meals?: None Help from another person taking care of personal grooming?: A Little Help from another person toileting, which includes using toliet, bedpan, or urinal?: A Little Help from another person bathing (including washing, rinsing, drying)?: A Little Help from another person to put on and taking off regular upper body clothing?: None Help from another person to put on and taking off regular lower body clothing?:  A Little 6 Click Score: 20   End of Session Equipment Utilized During Treatment: Oxygen Nurse Communication: Other (comment) (O2)  Activity Tolerance: Patient tolerated treatment well Patient left: in chair;with call bell/phone within reach  OT Visit Diagnosis: Other (comment) (decreased cardiopulmonary tolerance)                Time: 4403-4742 OT Time Calculation (min): 18 min Charges:  OT General Charges $OT Visit: 1 Visit OT Evaluation $OT Eval Moderate Complexity: 1 Mod  Layla Maw, OTR/L  Layla Maw 03/28/2020, 8:49 AM

## 2020-03-28 NOTE — Progress Notes (Signed)
Physical Therapy Treatment Patient Details Name: Amanda Ross MRN: [id number] DOB: [birth year] Today's Date: 03/28/2020    History of Present Illness 74 y.o. female with history of asthma, HTN, OSA, primary hypothyroidism and left thyroid nodule resented with myalgias, chills, intermittent cough) shortness of breath.  She tested positive for COVID-19 at her PCP office.  She apparently desaturated into the mid 80s and was sent over to the emergency department.  Chest x-ray did not show any significant findings.  CT angiogram did not show PE but did show multifocal patchy airspace opacities right greater than left.    PT Comments    Pt willing to get up and in the halls.  Gait with light use of the RW was generally steady and on 6L pt reported only mild symptoms, but sats dropped to 79-81% when check each time.  With coaching, pt able to recover to ~88% in 2+ min.    Follow Up Recommendations  Home health PT;Supervision/Assistance - 24 hour     Equipment Recommendations  None recommended by PT    Recommendations for Other Services       Precautions / Restrictions Precautions Precautions: Fall Precaution Comments: watch O2    Mobility  Bed Mobility               General bed mobility comments: sitting in the chair on arrival  Transfers Overall transfer level: Modified independent Equipment used: None Transfers: Sit to/from Stand Sit to Stand: Modified independent (Device/Increase time)            Ambulation/Gait Ambulation/Gait assistance: Supervision Gait Distance (Feet): 240 Feet Assistive device: Rolling walker (2 wheeled) Gait Pattern/deviations: Step-through pattern     General Gait Details: generally steady, light use of the RW, shorter step length.  Sats at about 150 feet registered 78% rising to 83% in less that 20 secs.  Pt relatively asymptomatic.  Ambulated 75 veet more with SpO2 dropping to 81%,  HR in the 110's max.  Pt needed coaching to recover  to ~88% in 2-3 min.   Stairs             Wheelchair Mobility    Modified Rankin (Stroke Patients Only)       Balance Overall balance assessment: Needs assistance   Sitting balance-Leahy Scale: Good       Standing balance-Leahy Scale: Fair Standing balance comment: used RW for gait, cut stood and transferred without use of AD                            Cognition Arousal/Alertness: Awake/alert Behavior During Therapy: WFL for tasks assessed/performed Overall Cognitive Status: Within Functional Limits for tasks assessed                                        Exercises      General Comments General comments (skin integrity, edema, etc.): Initially sitting on %L at 88%, with each trial of activity on 6L, sats dropped to high 70's to lower 80's with coaching to help return in 2-3 min to upper 80's.      Pertinent Vitals/Pain Pain Assessment: No/denies pain Pain Intervention(s): Monitored during session    Home Living                      Prior Function  PT Goals (current goals can now be found in the care plan section) Acute Rehab PT Goals Patient Stated Goal: go home today PT Goal Formulation: With patient Time For Goal Achievement: 04/10/20 Potential to Achieve Goals: Good Progress towards PT goals: Progressing toward goals    Frequency    Min 3X/week      PT Plan Current plan remains appropriate    Co-evaluation              AM-PAC PT "6 Clicks" Mobility   Outcome Measure  Help needed turning from your back to your side while in a flat bed without using bedrails?: A Little Help needed moving from lying on your back to sitting on the side of a flat bed without using bedrails?: A Little Help needed moving to and from a bed to a chair (including a wheelchair)?: None Help needed standing up from a chair using your arms (e.g., wheelchair or bedside chair)?: None Help needed to walk in hospital  room?: None Help needed climbing 3-5 steps with a railing? : A Little 6 Click Score: 21    End of Session Equipment Utilized During Treatment: Oxygen Activity Tolerance: Patient tolerated treatment well Patient left: in chair;with call bell/phone within reach Nurse Communication: Mobility status PT Visit Diagnosis: Other abnormalities of gait and mobility (R26.89);Muscle weakness (generalized) (M62.81)     Time: 2505-3976 PT Time Calculation (min) (ACUTE ONLY): 28 min  Charges:  $Gait Training: 8-22 mins $Therapeutic Activity: 8-22 mins                     03/28/2020  Ginger Carne., PT Acute Rehabilitation Services [phone]  (pager) [phone]  (office)   Amanda Ross 03/28/2020, 6:49 PM

## 2020-03-29 ENCOUNTER — Telehealth: Payer: Medicare Other

## 2020-03-29 DIAGNOSIS — U071 COVID-19: Secondary | ICD-10-CM | POA: Diagnosis not present

## 2020-03-29 DIAGNOSIS — J9601 Acute respiratory failure with hypoxia: Secondary | ICD-10-CM | POA: Diagnosis not present

## 2020-03-29 LAB — CBC WITH DIFFERENTIAL/PLATELET
Abs Immature Granulocytes: 0.08 10*3/uL — ABNORMAL HIGH (ref 0.00–0.07)
Basophils Absolute: 0 10*3/uL (ref 0.0–0.1)
Basophils Relative: 0 %
Eosinophils Absolute: 0 10*3/uL (ref 0.0–0.5)
Eosinophils Relative: 0 %
HCT: 38.6 % (ref 36.0–46.0)
Hemoglobin: 12.8 g/dL (ref 12.0–15.0)
Immature Granulocytes: 1 %
Lymphocytes Relative: 6 %
Lymphs Abs: 0.5 10*3/uL — ABNORMAL LOW (ref 0.7–4.0)
MCH: 28.8 pg (ref 26.0–34.0)
MCHC: 33.2 g/dL (ref 30.0–36.0)
MCV: 86.9 fL (ref 80.0–100.0)
Monocytes Absolute: 0.5 10*3/uL (ref 0.1–1.0)
Monocytes Relative: 6 %
Neutro Abs: 7.8 10*3/uL — ABNORMAL HIGH (ref 1.7–7.7)
Neutrophils Relative %: 87 %
Platelets: 211 10*3/uL (ref 150–400)
RBC: 4.44 MIL/uL (ref 3.87–5.11)
RDW: 13.7 % (ref 11.5–15.5)
WBC: 8.9 10*3/uL (ref 4.0–10.5)
nRBC: 0 % (ref 0.0–0.2)

## 2020-03-29 LAB — COMPREHENSIVE METABOLIC PANEL
ALT: 23 U/L (ref 0–44)
AST: 18 U/L (ref 15–41)
Albumin: 2.7 g/dL — ABNORMAL LOW (ref 3.5–5.0)
Alkaline Phosphatase: 57 U/L (ref 38–126)
Anion gap: 11 (ref 5–15)
BUN: 33 mg/dL — ABNORMAL HIGH (ref 8–23)
CO2: 28 mmol/L (ref 22–32)
Calcium: 10.8 mg/dL — ABNORMAL HIGH (ref 8.9–10.3)
Chloride: 102 mmol/L (ref 98–111)
Creatinine, Ser: 1.44 mg/dL — ABNORMAL HIGH (ref 0.44–1.00)
GFR, Estimated: 36 mL/min — ABNORMAL LOW (ref >60)
Glucose, Bld: 164 mg/dL — ABNORMAL HIGH (ref 70–99)
Potassium: 4.1 mmol/L (ref 3.5–5.1)
Sodium: 141 mmol/L (ref 135–145)
Total Bilirubin: 0.4 mg/dL (ref 0.3–1.2)
Total Protein: 5.9 g/dL — ABNORMAL LOW (ref 6.5–8.1)

## 2020-03-29 LAB — BRAIN NATRIURETIC PEPTIDE: B Natriuretic Peptide: 99.5 pg/mL (ref 0.0–100.0)

## 2020-03-29 LAB — MAGNESIUM: Magnesium: 2.1 mg/dL (ref 1.7–2.4)

## 2020-03-29 LAB — PROCALCITONIN: Procalcitonin: <0.1 ng/mL

## 2020-03-29 LAB — CULTURE, BLOOD (ROUTINE X 2)
Culture: NO GROWTH
Special Requests: ADEQUATE

## 2020-03-29 LAB — C-REACTIVE PROTEIN: CRP: 2.5 mg/dL — ABNORMAL HIGH (ref <1.0)

## 2020-03-29 LAB — D-DIMER, QUANTITATIVE: D-Dimer, Quant: 0.62 ug/mL-FEU — ABNORMAL HIGH (ref 0.00–0.50)

## 2020-03-29 MED ORDER — TOCILIZUMAB 400 MG/20ML IV SOLN
800.0000 mg | Freq: Once | INTRAVENOUS | Status: AC
  Administered 2020-03-29: 800 mg via INTRAVENOUS
  Filled 2020-03-29: qty 40

## 2020-03-29 MED ORDER — ENOXAPARIN SODIUM 40 MG/0.4ML ~~LOC~~ SOLN
40.0000 mg | Freq: Two times a day (BID) | SUBCUTANEOUS | Status: DC
  Administered 2020-03-29 – 2020-04-11 (×28): 40 mg via SUBCUTANEOUS
  Filled 2020-03-29 (×27): qty 0.4

## 2020-03-29 MED ORDER — FUROSEMIDE 10 MG/ML IJ SOLN
40.0000 mg | Freq: Once | INTRAMUSCULAR | Status: AC
  Administered 2020-03-29: 40 mg via INTRAVENOUS
  Filled 2020-03-29: qty 4

## 2020-03-29 MED ORDER — METHYLPREDNISOLONE SODIUM SUCC 125 MG IJ SOLR
60.0000 mg | Freq: Two times a day (BID) | INTRAMUSCULAR | Status: DC
  Administered 2020-03-29 – 2020-04-11 (×26): 60 mg via INTRAVENOUS
  Filled 2020-03-29 (×26): qty 2

## 2020-03-29 MED ORDER — ENOXAPARIN SODIUM 40 MG/0.4ML ~~LOC~~ SOLN: 40.0000 mg | Freq: Two times a day (BID) | SUBCUTANEOUS | Status: DC

## 2020-03-29 MED ORDER — METOPROLOL TARTRATE 25 MG PO TABS
25.0000 mg | ORAL_TABLET | Freq: Two times a day (BID) | ORAL | Status: DC
  Administered 2020-03-29 – 2020-04-02 (×11): 25 mg via ORAL
  Filled 2020-03-29 (×10): qty 1

## 2020-03-29 NOTE — Plan of Care (Signed)

## 2020-03-29 NOTE — Progress Notes (Signed)
PROGRESS NOTE  Amanda Ross HYW:[id number] DOB: [birth year] DOA: 03/24/2020  PCP: [name], MD  Brief History/Interval Summary: 74 y.o. female with history of asthma, HTN, OSA, primary hypothyroidism and left thyroid nodule resented with myalgias, chills, intermittent cough) shortness of breath.  She tested positive for COVID-19 at her PCP office.  She apparently desaturated into the mid 80s and was sent over to the emergency department.  Chest x-ray did not show any significant findings.  CT angiogram did not show PE but did show multifocal patchy airspace opacities right greater than left.    Reason for Visit: Pneumonia due to COVID-19.  Acute respiratory failure with hypoxia.  Consultants: None  Procedures: None  Antibiotics: Anti-infectives (From admission, onward)   Start     Dose/Rate Route Frequency Ordered Stop   03/25/20 1000  remdesivir 100 mg in sodium chloride 0.9 % 100 mL IVPB       "Followed by" Linked Group Details   100 mg 200 mL/hr over 30 Minutes Intravenous Daily 03/24/20 1533 03/28/20 0900   03/24/20 1545  remdesivir 200 mg in sodium chloride 0.9% 250 mL IVPB       "Followed by" Linked Group Details   200 mg 580 mL/hr over 30 Minutes Intravenous Once 03/24/20 1533 03/24/20 1940      Subjective/Interval History:  Patient in bed, appears comfortable, denies any headache, no fever, no chest pain or pressure, mild shortness of breath , no abdominal pain. No focal weakness.  Assessment/Plan:  Acute Hypoxic Resp. Failure/Pneumonia due to COVID-19 - she is fully vaccinated and unfortunately has breakthrough disease, initially responded well to IV steroids and Remdesivir however on 03/29/2020 more hypoxic, now on 9 L, will give her Actemra.  Informed consent obtained from the patient.  Encourage her to sit up in the chair and use I-S and flutter valve for pulmonary toiletry.  Monitor closely.  Actemra off label use - patient was told that if COVID-19  pneumonitis gets worse we might potentially use Actemra off label, she denies any known history of active diverticulitis, tuberculosis or hepatitis, no active Diverticulitis or known other active infections, understands the risks and benefits and wants to proceed with Actemra treatment if required.   Recent Labs  Lab 03/24/20 1013 03/24/20 1039 03/24/20 1046 03/24/20 1152 03/24/20 1936 03/25/20 0300 03/26/20 0210 03/26/20 0904 03/27/20 0228 03/27/20 0229 03/28/20 0438 03/29/20 0408  WBC  --    < >  --   --   --  2.7* 6.5  --  7.3  --  9.5 8.9  HGB  --    < >  --   --   --  12.1 12.1  --  12.9  --  12.9 12.8  HCT  --    < >  --   --   --  36.5 36.3  --  38.0  --  38.0 38.6  PLT  --    < >  --   --   --  PLATELET CLUMPS NOTED ON SMEAR, COUNT APPEARS ADEQUATE 146*  --  165  --  231 211  CRP  --    < >  --   --  17.0* 18.5* 13.1*  --  6.4*  --  2.9* 2.5*  BNP  --   --   --   --   --   --   --   --   --  97.3 116.2* 99.5  DDIMER  --    < >  --    < >  --  1.02* 0.83*  --  0.62*  --  0.51* 0.62*  PROCALCITON  --   --   --   --  0.16  --   --  0.14 0.10  --  <0.10 <0.10  AST  --   --   --   --   --  28 27  --  28  --  25 18  ALT  --   --   --   --   --  19 20  --  21  --  23 23  ALKPHOS  --   --   --   --   --  58 54  --  53  --  61 57  BILITOT  --   --   --   --   --  0.6 0.7  --  0.3  --  0.1* 0.4  ALBUMIN  --   --   --   --   --  2.8* 2.8*  --  2.6*  --  2.6* 2.7*  SARSCOV2NAA Detected*  --  POSITIVE*  --   --   --   --   --   --   --   --   --    < > = values in this interval not displayed.      1 out of 2 staph epidermis positive blood culture.  Likely contaminant.  Monitor.  Acute kidney injury on chronic kidney disease stage IIIb -   her baseline creatinine is around 1.4, AKI likely due to ATN caused by acute infection and dehydration, improved, continue to monitor  Obstructive sleep apnea - Continue with oxygen.  History of asthma - Steroids and inhalers as  above.  Essential hypertension - Continue metoprolol, have added Norvasc for better control.    History of overactive bladder - Monitor. Continue oxybutynin.  Mild Thrombocytopenia - Due to viral infection. Resolved.  Morbid obesity - BMI  Of > 39 Follow with PCP for weight loss.    DVT Prophylaxis: Lovenox Code Status: Full code Family Communication: Discussed with patient.  Called her husband [name]   #[phone] and updated 03/28/20, 03/29/20 Disposition Plan: Hopefully return home when improved  Status is: Inpatient  Remains inpatient appropriate because:IV treatments appropriate due to intensity of illness or inability to take PO and Inpatient level of care appropriate due to severity of illness   Dispo: The patient is from: Home              Anticipated d/c is to: Home              Anticipated d/c date is: 2 days              Patient currently is not medically stable to d/c.   Medications:  Scheduled: . amLODipine  10 mg Oral Daily  . vitamin C  500 mg Oral Daily  . aspirin EC  81 mg Oral Daily  . cholecalciferol  2,000 Units Oral Daily  . enoxaparin (LOVENOX) injection  40 mg Subcutaneous Q24H  . furosemide  40 mg Intravenous Once  . Ipratropium-Albuterol  1 puff Inhalation Q6H  . methylPREDNISolone (SOLU-MEDROL) injection  40 mg Intravenous Q12H  . metoprolol tartrate  25 mg Oral BID  . oxybutynin  10 mg Oral QHS  . pantoprazole  40 mg Oral Daily  . traZODone  50 mg Oral QHS  . zinc sulfate  220 mg Oral Daily   Continuous:  IOE:[id number], baclofen, bisacodyl, chlorpheniramine-HYDROcodone, guaiFENesin-dextromethorphan,  hydroxypropyl methylcellulose / hypromellose, magnesium citrate, [DISCONTINUED] ondansetron **OR** ondansetron (ZOFRAN) IV, polyethylene glycol, zolpidem   Objective:  Vital Signs  Vitals:   03/28/20 1543 03/28/20 2202 03/29/20 0126 03/29/20 0857  BP: 132/84 134/90  138/86  Pulse: 78 84  74  Resp:    18  Temp: (!) 97.5 F (36.4 C)  98.1 F (36.7 C)  98.5 F (36.9 C)  TempSrc: Oral Oral    SpO2: 90% (!) 89% 93% (!) 89%  Weight:      Height:        Intake/Output Summary (Last 24 hours) at 03/29/2020 0901 Last data filed at 03/28/2020 2202 Gross per 24 hour  Intake 100 ml  Output --  Net 100 ml   Filed Weights   03/24/20 1033  Weight: 102.1 kg    EXAM  Awake Alert, No new F.N deficits, Normal affect [place].AT,PERRAL Supple Neck,No JVD, No cervical lymphadenopathy appriciated.  Symmetrical Chest wall movement, Good air movement bilaterally, few rales RRR,No Gallops, Rubs or new Murmurs, No Parasternal Heave +ve B.Sounds, Abd Soft, No tenderness, No organomegaly appriciated, No rebound - guarding or rigidity. No Cyanosis, Clubbing or edema, No new Rash or bruise   Lab Results:  Data Reviewed: I have personally reviewed following labs and imaging studies  Recent Labs  Lab 03/25/20 0300 03/26/20 0210 03/27/20 0228 03/28/20 0438 03/29/20 0408  WBC 2.7* 6.5 7.3 9.5 8.9  HGB 12.1 12.1 12.9 12.9 12.8  HCT 36.5 36.3 38.0 38.0 38.6  PLT PLATELET CLUMPS NOTED ON SMEAR, COUNT APPEARS ADEQUATE 146* 165 231 211  MCV 88.0 87.1 87.4 89.0 86.9  MCH 29.2 29.0 29.7 30.2 28.8  MCHC 33.2 33.3 33.9 33.9 33.2  RDW 13.5 13.5 13.7 13.8 13.7  LYMPHSABS 0.2* 0.5* 0.5* 0.6* 0.5*  MONOABS 0.1 0.3 0.3 0.6 0.5  EOSABS 0.0 0.0 0.0 0.0 0.0  BASOSABS 0.0 0.0 0.0 0.0 0.0    Recent Labs  Lab 03/24/20 1936 03/24/20 1936 03/25/20 0300 03/26/20 0210 03/26/20 0904 03/27/20 0228 03/27/20 0229 03/28/20 0438 03/29/20 0408  NA 138   < > 138 138  --  139  --  139 141  K 4.0   < > 4.0 4.0  --  4.2  --  4.4 4.1  CL 103   < > 105 105  --  105  --  105 102  CO2 20*   < > 21* 22  --  25  --  25 28  GLUCOSE 97   < > 148* 153*  --  152*  --  154* 164*  BUN 23   < > 23 25*  --  25*  --  26* 33*  CREATININE 1.61*   < > 1.55* 1.55*  --  1.30*  --  1.43* 1.44*  CALCIUM 8.9   < > 8.8* 9.2  --  9.9  --  10.5* 10.8*  AST  --   --  28  27  --  28  --  25 18  ALT  --   --  19 20  --  21  --  23 23  ALKPHOS  --   --  58 54  --  53  --  61 57  BILITOT  --   --  0.6 0.7  --  0.3  --  0.1* 0.4  ALBUMIN  --   --  2.8* 2.8*  --  2.6*  --  2.6* 2.7*  MG  --   --   --   --   --  2.0  --  2.1 2.1  CRP 17.0*   < > 18.5* 13.1*  --  6.4*  --  2.9* 2.5*  DDIMER  --   --  1.02* 0.83*  --  0.62*  --  0.51* 0.62*  PROCALCITON 0.16  --   --   --  0.14 0.10  --  <0.10 <0.10  BNP  --   --   --   --   --   --  97.3 116.2* 99.5   < > = values in this interval not displayed.    Recent Labs  Lab 03/24/20 1013 03/24/20 1039 03/24/20 1046 03/24/20 1152 03/24/20 1936 03/25/20 0300 03/26/20 0210 03/26/20 0904 03/27/20 0228 03/27/20 0229 03/28/20 0438 03/29/20 0408  WBC  --    < >  --   --   --  2.7* 6.5  --  7.3  --  9.5 8.9  CRP  --    < >  --   --  17.0* 18.5* 13.1*  --  6.4*  --  2.9* 2.5*  DDIMER  --    < >  --    < >  --  1.02* 0.83*  --  0.62*  --  0.51* 0.62*  BNP  --   --   --   --   --   --   --   --   --  97.3 116.2* 99.5  PROCALCITON  --   --   --   --  0.16  --   --  0.14 0.10  --  <0.10 <0.10  AST  --   --   --   --   --  28 27  --  28  --  25 18  ALT  --   --   --   --   --  19 20  --  21  --  23 23  ALKPHOS  --   --   --   --   --  58 54  --  53  --  61 57  BILITOT  --   --   --   --   --  0.6 0.7  --  0.3  --  0.1* 0.4  ALBUMIN  --   --   --   --   --  2.8* 2.8*  --  2.6*  --  2.6* 2.7*  SARSCOV2NAA Detected*  --  POSITIVE*  --   --   --   --   --   --   --   --   --    < > = values in this interval not displayed.        Recent Results (from the past 240 hour(s))  Novel Coronavirus, NAA ([facility])     Status: Abnormal   Collection Time: 03/24/20 10:13 AM   Specimen: Nasopharyngeal(NP) swabs in vial transport medium  Result Value Ref Range Status   SARS-CoV-2, NAA Detected (A) Not Detected Final    Comment: Patients who have a positive COVID-19 test result may now have treatment options. Treatment options are  available for patients with mild to moderate symptoms and for hospitalized patients. Visit our website at [email or web address] for resources and information. This nucleic acid amplification test was developed and its performance characteristics determined by [employer]. Nucleic acid amplification tests include RT-PCR and TMA. This test has not been FDA cleared or approved. This test has been authorized by FDA under an Emergency Use Authorization (  EUA). This test is only authorized for the duration of time the declaration that circumstances exist justifying the authorization of the emergency use of in vitro diagnostic tests for detection of SARS-CoV-2 virus and/or diagnosis of COVID-19 infection under section 564(b)(1) of the Act, 21 U.S.C. 448JEH-6(D) (1), unless the authorization is terminated or revoked sooner. When diagnostic testing is negativ e, the possibility of a false negative result should be considered in the context of a patient's recent exposures and the presence of clinical signs and symptoms consistent with COVID-19. An individual without symptoms of COVID-19 and who is not shedding SARS-CoV-2 virus would expect to have a negative (not detected) result in this assay.   SARS-COV-2, NAA 2 DAY TAT     Status: None   Collection Time: 03/24/20 10:13 AM  Result Value Ref Range Status   SARS-CoV-2, NAA 2 DAY TAT Performed  Final  Respiratory Panel by RT PCR (Flu A&B, Covid) - Nasopharyngeal Swab     Status: Abnormal   Collection Time: 03/24/20 10:46 AM   Specimen: Nasopharyngeal Swab  Result Value Ref Range Status   SARS Coronavirus 2 by RT PCR POSITIVE (A) NEGATIVE Final    Comment: emailed L. Berdik RN 12:45 03/24/20 ([email or web address]) (NOTE) SARS-CoV-2 target nucleic acids are DETECTED.  SARS-CoV-2 RNA is generally detectable in upper respiratory specimens  during the acute phase of infection. Positive results are indicative of the presence of the identified  virus, but do not rule out bacterial infection or co-infection with other pathogens not detected by the test. Clinical correlation with patient history and other diagnostic information is necessary to determine patient infection status. The expected result is Negative.  Fact Sheet for Patients:  PinkCheek.be  Fact Sheet for Healthcare Providers: GravelBags.it  This test is not yet approved or cleared by the [place] FDA and  has been authorized for detection and/or diagnosis of SARS-CoV-2 by FDA under an Emergency Use Authorization (EUA).  This EUA will remain in effect (meaning this test can be used) for the duration of  the COVID-19  declaration under Section 564(b)(1) of the Act, 21 U.S.C. section 360bbb-3(b)(1), unless the authorization is terminated or revoked sooner.      Influenza A by PCR NEGATIVE NEGATIVE Final   Influenza B by PCR NEGATIVE NEGATIVE Final    Comment: (NOTE) The Xpert Xpress SARS-CoV-2/FLU/RSV assay is intended as an aid in  the diagnosis of influenza from Nasopharyngeal swab specimens and  should not be used as a sole basis for treatment. Nasal washings and  aspirates are unacceptable for Xpert Xpress SARS-CoV-2/FLU/RSV  testing.  Fact Sheet for Patients: PinkCheek.be  Fact Sheet for Healthcare Providers: GravelBags.it  This test is not yet approved or cleared by the [place] FDA and  has been authorized for detection and/or diagnosis of SARS-CoV-2 by  FDA under an Emergency Use Authorization (EUA). This EUA will remain  in effect (meaning this test can be used) for the duration of the  Covid-19 declaration under Section 564(b)(1) of the Act, 21  U.S.C. section 360bbb-3(b)(1), unless the authorization is  terminated or revoked. Performed at [facility], [place] [address], [place], [place] [address]   Blood Culture  (routine x 2)     Status: None   Collection Time: 03/24/20  7:20 PM   Specimen: BLOOD  Result Value Ref Range Status   Specimen Description BLOOD LEFT ANTECUBITAL  Final   Special Requests AEROBIC BOTTLE ONLY Blood Culture adequate volume  Final   Culture  Final    NO GROWTH 5 DAYS Performed at [facility], [place] [address] [place], [place] [address]    Report Status 03/29/2020 FINAL  Final  Blood Culture (routine x 2)     Status: Abnormal   Collection Time: 03/24/20  7:30 PM   Specimen: BLOOD LEFT WRIST  Result Value Ref Range Status   Specimen Description BLOOD LEFT WRIST  Final   Special Requests AEROBIC BOTTLE ONLY Blood Culture adequate volume  Final   Culture  Setup Time   Final    GRAM POSITIVE COCCI AEROBIC BOTTLE ONLY CRITICAL RESULT CALLED TO, READ BACK BY AND VERIFIED WITH: PHARMD J MILLEN 03/25/20 AT 2024 SK    Culture (A)  Final    STAPHYLOCOCCUS EPIDERMIDIS THE SIGNIFICANCE OF ISOLATING THIS ORGANISM FROM A SINGLE SET OF BLOOD CULTURES WHEN MULTIPLE SETS ARE DRAWN IS UNCERTAIN. PLEASE NOTIFY THE MICROBIOLOGY DEPARTMENT WITHIN ONE WEEK IF SPECIATION AND SENSITIVITIES ARE REQUIRED. Performed at [facility], [place] [address], [place], [place] [address]    Report Status 03/26/2020 FINAL  Final  Blood Culture ID Panel (Reflexed)     Status: Abnormal   Collection Time: 03/24/20  7:30 PM  Result Value Ref Range Status   Enterococcus faecalis NOT DETECTED NOT DETECTED Final   Enterococcus Faecium NOT DETECTED NOT DETECTED Final   Listeria monocytogenes NOT DETECTED NOT DETECTED Final   Staphylococcus species DETECTED (A) NOT DETECTED Final    Comment: CRITICAL RESULT CALLED TO, READ BACK BY AND VERIFIED WITH: PHARMD J MILLEN 03/25/20 AT 2024 SK    Staphylococcus aureus (BCID) NOT DETECTED NOT DETECTED Final   Staphylococcus epidermidis DETECTED (A) NOT DETECTED Final    Comment: CRITICAL RESULT CALLED TO, READ BACK BY AND VERIFIED WITH: PHARMD J MILLEN  03/25/20 AT 2024 SK    Staphylococcus lugdunensis NOT DETECTED NOT DETECTED Final   Streptococcus species NOT DETECTED NOT DETECTED Final   Streptococcus agalactiae NOT DETECTED NOT DETECTED Final   Streptococcus pneumoniae NOT DETECTED NOT DETECTED Final   Streptococcus pyogenes NOT DETECTED NOT DETECTED Final   A.calcoaceticus-baumannii NOT DETECTED NOT DETECTED Final   Bacteroides fragilis NOT DETECTED NOT DETECTED Final   Enterobacterales NOT DETECTED NOT DETECTED Final   Enterobacter cloacae complex NOT DETECTED NOT DETECTED Final   Escherichia coli NOT DETECTED NOT DETECTED Final   Klebsiella aerogenes NOT DETECTED NOT DETECTED Final   Klebsiella oxytoca NOT DETECTED NOT DETECTED Final   Klebsiella pneumoniae NOT DETECTED NOT DETECTED Final   Proteus species NOT DETECTED NOT DETECTED Final   Salmonella species NOT DETECTED NOT DETECTED Final   Serratia marcescens NOT DETECTED NOT DETECTED Final   Haemophilus influenzae NOT DETECTED NOT DETECTED Final   Neisseria meningitidis NOT DETECTED NOT DETECTED Final   Pseudomonas aeruginosa NOT DETECTED NOT DETECTED Final   Stenotrophomonas maltophilia NOT DETECTED NOT DETECTED Final   Candida albicans NOT DETECTED NOT DETECTED Final   Candida auris NOT DETECTED NOT DETECTED Final   Candida glabrata NOT DETECTED NOT DETECTED Final   Candida krusei NOT DETECTED NOT DETECTED Final   Candida parapsilosis NOT DETECTED NOT DETECTED Final   Candida tropicalis NOT DETECTED NOT DETECTED Final   Cryptococcus neoformans/gattii NOT DETECTED NOT DETECTED Final   Methicillin resistance mecA/C NOT DETECTED NOT DETECTED Final    Comment: Performed at [facility], [address] [place], [place] [address]  Culture, blood (routine x 2)     Status: None (Preliminary result)   Collection Time:  03/25/20 10:02 PM   Specimen: BLOOD LEFT ARM  Result Value Ref Range Status   Specimen Description BLOOD LEFT ARM  Final   Special Requests   Final     BOTTLES DRAWN AEROBIC AND ANAEROBIC Blood Culture adequate volume   Culture   Final    NO GROWTH 4 DAYS Performed at [facility], [address] [place], [place] [address]    Report Status PENDING  Incomplete  Culture, blood (routine x 2)     Status: None (Preliminary result)   Collection Time: 03/25/20 10:03 PM   Specimen: BLOOD LEFT HAND  Result Value Ref Range Status   Specimen Description BLOOD LEFT HAND  Final   Special Requests   Final    BOTTLES DRAWN AEROBIC AND ANAEROBIC Blood Culture adequate volume   Culture   Final    NO GROWTH 4 DAYS Performed at [facility], [place] [address] [place], [place] [address]    Report Status PENDING  Incomplete      Radiology Studies: DG Chest Port 1 View  Result Date: 03/28/2020 CLINICAL DATA:  Shortness of breath.  Reported COVID-19 positive EXAM: PORTABLE CHEST 1 VIEW COMPARISON:  Chest CT March 25, 2020; chest radiograph March 24, 2020 FINDINGS: There is patchy airspace opacity in the mid and lower lung zones bilaterally, most notable in the right lower lung region. There appears to be slightly less opacity in the upper lobes compared to recent CT. No consolidation. Heart is upper normal in size with pulmonary vascularity normal. No adenopathy. No bone lesions. There is aortic atherosclerosis. IMPRESSION: Multifocal airspace opacity, likely representing atypical organism multifocal pneumonia. Slightly less opacity in the upper lobes compared to recent CT examination. Other areas appear essentially stable. No new opacities evident. Stable cardiac silhouette.  No evident adenopathy. Aortic Atherosclerosis (ICD10-I70.0). Electronically Signed   By: Lowella Grip III M.D.   On: 03/28/2020 10:12     LOS: 5 days   [facility] Pager on [email or web address]  03/29/2020, 9:01 AM

## 2020-03-30 DIAGNOSIS — U071 COVID-19: Secondary | ICD-10-CM | POA: Diagnosis not present

## 2020-03-30 DIAGNOSIS — J9601 Acute respiratory failure with hypoxia: Secondary | ICD-10-CM | POA: Diagnosis not present

## 2020-03-30 LAB — D-DIMER, QUANTITATIVE: D-Dimer, Quant: 0.43 ug/mL-FEU (ref 0.00–0.50)

## 2020-03-30 LAB — CBC WITH DIFFERENTIAL/PLATELET
Abs Immature Granulocytes: 0.12 10*3/uL — ABNORMAL HIGH (ref 0.00–0.07)
Basophils Absolute: 0 10*3/uL (ref 0.0–0.1)
Basophils Relative: 0 %
Eosinophils Absolute: 0 10*3/uL (ref 0.0–0.5)
Eosinophils Relative: 0 %
HCT: 41.2 % (ref 36.0–46.0)
Hemoglobin: 13.9 g/dL (ref 12.0–15.0)
Immature Granulocytes: 1 %
Lymphocytes Relative: 8 %
Lymphs Abs: 0.8 10*3/uL (ref 0.7–4.0)
MCH: 29.6 pg (ref 26.0–34.0)
MCHC: 33.7 g/dL (ref 30.0–36.0)
MCV: 87.7 fL (ref 80.0–100.0)
Monocytes Absolute: 0.7 10*3/uL (ref 0.1–1.0)
Monocytes Relative: 7 %
Neutro Abs: 8.4 10*3/uL — ABNORMAL HIGH (ref 1.7–7.7)
Neutrophils Relative %: 84 %
Platelets: 265 10*3/uL (ref 150–400)
RBC: 4.7 MIL/uL (ref 3.87–5.11)
RDW: 13.7 % (ref 11.5–15.5)
WBC: 10.1 10*3/uL (ref 4.0–10.5)
nRBC: 0 % (ref 0.0–0.2)

## 2020-03-30 LAB — CULTURE, BLOOD (ROUTINE X 2)
Culture: NO GROWTH
Culture: NO GROWTH
Special Requests: ADEQUATE
Special Requests: ADEQUATE

## 2020-03-30 LAB — COMPREHENSIVE METABOLIC PANEL
ALT: 22 U/L (ref 0–44)
AST: 20 U/L (ref 15–41)
Albumin: 2.9 g/dL — ABNORMAL LOW (ref 3.5–5.0)
Alkaline Phosphatase: 61 U/L (ref 38–126)
Anion gap: 12 (ref 5–15)
BUN: 34 mg/dL — ABNORMAL HIGH (ref 8–23)
CO2: 26 mmol/L (ref 22–32)
Calcium: 11 mg/dL — ABNORMAL HIGH (ref 8.9–10.3)
Chloride: 100 mmol/L (ref 98–111)
Creatinine, Ser: 1.34 mg/dL — ABNORMAL HIGH (ref 0.44–1.00)
GFR, Estimated: 39 mL/min — ABNORMAL LOW (ref >60)
Glucose, Bld: 146 mg/dL — ABNORMAL HIGH (ref 70–99)
Potassium: 4.3 mmol/L (ref 3.5–5.1)
Sodium: 138 mmol/L (ref 135–145)
Total Bilirubin: 0.5 mg/dL (ref 0.3–1.2)
Total Protein: 6.3 g/dL — ABNORMAL LOW (ref 6.5–8.1)

## 2020-03-30 LAB — BRAIN NATRIURETIC PEPTIDE: B Natriuretic Peptide: 78.9 pg/mL (ref 0.0–100.0)

## 2020-03-30 LAB — PROCALCITONIN: Procalcitonin: <0.1 ng/mL

## 2020-03-30 LAB — C-REACTIVE PROTEIN: CRP: 1.5 mg/dL — ABNORMAL HIGH (ref <1.0)

## 2020-03-30 MED ORDER — SIMETHICONE 80 MG PO CHEW
80.0000 mg | CHEWABLE_TABLET | Freq: Four times a day (QID) | ORAL | Status: DC | PRN
  Administered 2020-03-30 – 2020-04-11 (×3): 80 mg via ORAL
  Filled 2020-03-30 (×3): qty 1

## 2020-03-30 MED ORDER — HYDRALAZINE HCL 20 MG/ML IJ SOLN: 10.0000 mg | Freq: Four times a day (QID) | INTRAMUSCULAR | Status: DC | PRN

## 2020-03-30 MED ORDER — SALINE SPRAY 0.65 % NA SOLN
1.0000 | NASAL | Status: DC | PRN
  Administered 2020-03-30 – 2020-04-07 (×2): 1 via NASAL
  Filled 2020-03-30: qty 44

## 2020-03-30 MED ORDER — HYDRALAZINE HCL 25 MG PO TABS
25.0000 mg | ORAL_TABLET | Freq: Three times a day (TID) | ORAL | Status: DC
  Administered 2020-03-30 – 2020-04-03 (×12): 25 mg via ORAL
  Filled 2020-03-30 (×12): qty 1

## 2020-03-30 MED ORDER — FUROSEMIDE 40 MG PO TABS
40.0000 mg | ORAL_TABLET | Freq: Once | ORAL | Status: AC
  Administered 2020-03-30: 40 mg via ORAL
  Filled 2020-03-30: qty 1

## 2020-03-30 NOTE — Progress Notes (Signed)
PROGRESS NOTE  Amanda Ross:[id number] DOB: 24-Dec-1945 DOA: 03/24/2020  PCP: [name], MD  Brief History/Interval Summary: 74 y.o. female with history of asthma, HTN, OSA, primary hypothyroidism and left thyroid nodule resented with myalgias, chills, intermittent cough) shortness of breath.  She tested positive for COVID-19 at her PCP office.  She apparently desaturated into the mid 80s and was sent over to the emergency department.  Chest x-ray did not show any significant findings.  CT angiogram did not show PE but did show multifocal patchy airspace opacities right greater than left.    Reason for Visit: Pneumonia due to COVID-19.  Acute respiratory failure with hypoxia.  Consultants: None  Procedures: None  Antibiotics: Anti-infectives (From admission, onward)   Start     Dose/Rate Route Frequency Ordered Stop   03/25/20 1000  remdesivir 100 mg in sodium chloride 0.9 % 100 mL IVPB       "Followed by" Linked Group Details   100 mg 200 mL/hr over 30 Minutes Intravenous Daily 03/24/20 1533 03/28/20 0900   03/24/20 1545  remdesivir 200 mg in sodium chloride 0.9% 250 mL IVPB       "Followed by" Linked Group Details   200 mg 580 mL/hr over 30 Minutes Intravenous Once 03/24/20 1533 03/24/20 1940      Subjective/Interval History:  Patient in bed, appears comfortable, denies any headache, no fever, no chest pain or pressure, +ve shortness of breath , no abdominal pain. No focal weakness.   Assessment/Plan:  Acute Hypoxic Resp. Failure/Pneumonia due to COVID-19 - she is fully vaccinated and unfortunately has breakthrough disease, initially responded well to IV steroids and Remdesivir however on 03/29/2020 more hypoxic, he was given Actemra on 03/29/2020 early in the morning, currently on 15 L of nasal cannula oxygen plus nonrebreather, will transition her to heated high flow on 03/30/2020.  As needed Lasix being used as well, overall still quite tenuous continue to monitor  closely.  Encouraged her to sit up in chair in the daytime use I-S and flutter valve for pulmonary toiletry and prone at night.  SpO2: 97 % O2 Flow Rate (L/min): 15 L/min   Recent Labs  Lab 03/24/20 1013 03/24/20 1039 03/24/20 1046 03/24/20 1152 03/24/20 1936 03/26/20 0210 03/26/20 0904 03/27/20 0228 03/27/20 0229 03/28/20 0438 03/29/20 0408 03/30/20 0515  WBC  --    < >  --    < >  --  6.5  --  7.3  --  9.5 8.9 10.1  HGB  --    < >  --    < >  --  12.1  --  12.9  --  12.9 12.8 13.9  HCT  --    < >  --    < >  --  36.3  --  38.0  --  38.0 38.6 41.2  PLT  --    < >  --    < >  --  146*  --  165  --  231 211 265  CRP  --   --   --    < >  --  13.1*  --  6.4*  --  2.9* 2.5* 1.5*  BNP  --   --   --   --   --   --   --   --  97.3 116.2* 99.5 78.9  DDIMER  --   --   --    < >  --  0.83*  --  0.62*  --  0.51* 0.62* 0.43  PROCALCITON  --   --   --    < >   < >  --  0.14 0.10  --  <0.10 <0.10 <0.10  AST  --   --   --    < >  --  27  --  28  --  25 18 20   ALT  --   --   --    < >  --  20  --  21  --  23 23 22   ALKPHOS  --   --   --    < >  --  54  --  53  --  61 57 61  BILITOT  --   --   --    < >  --  0.7  --  0.3  --  0.1* 0.4 0.5  ALBUMIN  --   --   --    < >  --  2.8*  --  2.6*  --  2.6* 2.7* 2.9*  SARSCOV2NAA Detected*  --  POSITIVE*  --   --   --   --   --   --   --   --   --    < > = values in this interval not displayed.      1 out of 2 staph epidermis positive blood culture.  Contaminant.     Acute kidney injury on chronic kidney disease stage IIIb -   her baseline creatinine is around 1.4, AKI likely due to ATN caused by acute infection and dehydration, improved, continue to monitor  Obstructive sleep apnea - Continue with oxygen.  History of asthma - Steroids and inhalers as above.  Essential hypertension - Continue Toprol and Norvasc combination, added IV hydralazine as needed.    History of overactive bladder - Monitor. Continue oxybutynin.  Mild  Thrombocytopenia - Due to viral infection. Resolved.  Morbid obesity - BMI  Of > 39 Follow with PCP for weight loss.    DVT Prophylaxis: Lovenox Code Status: Full code Family Communication: Discussed with patient.  Called her husband [name]   #[phone] and updated 03/28/20, 03/29/20 Disposition Plan: Hopefully return home when improved  Status is: Inpatient  Remains inpatient appropriate because:IV treatments appropriate due to intensity of illness or inability to take PO and Inpatient level of care appropriate due to severity of illness   Dispo: The patient is from: Home              Anticipated d/c is to: Home              Anticipated d/c date is: 2 days              Patient currently is not medically stable to d/c.   Medications:  Scheduled: . amLODipine  10 mg Oral Daily  . vitamin C  500 mg Oral Daily  . aspirin EC  81 mg Oral Daily  . cholecalciferol  2,000 Units Oral Daily  . enoxaparin (LOVENOX) injection  40 mg Subcutaneous Q12H  . Ipratropium-Albuterol  1 puff Inhalation Q6H  . methylPREDNISolone (SOLU-MEDROL) injection  60 mg Intravenous Q12H  . metoprolol tartrate  25 mg Oral BID  . oxybutynin  10 mg Oral QHS  . pantoprazole  40 mg Oral Daily  . traZODone  50 mg Oral QHS  . zinc sulfate  220 mg Oral Daily   Continuous:  DXA:JOINOMVEHMCNO, baclofen, bisacodyl, chlorpheniramine-HYDROcodone, guaiFENesin-dextromethorphan, hydroxypropyl methylcellulose / hypromellose, magnesium citrate, [DISCONTINUED]  ondansetron **OR** ondansetron (ZOFRAN) IV, polyethylene glycol, zolpidem   Objective:  Vital Signs  Vitals:   03/29/20 1605 03/29/20 2353 03/30/20 0815 03/30/20 0832  BP: 117/81 128/74  (!) 149/100  Pulse: 75 62 74 76  Resp: 17 20 18 20   Temp: 97.8 F (36.6 C) 98.1 F (36.7 C)  97.6 F (36.4 C)  TempSrc:  Axillary    SpO2: 92% 97% 93% 97%  Weight:      Height:       No intake or output data in the 24 hours ending 03/30/20 1139 Filed Weights    03/24/20 1033  Weight: 102.1 kg    EXAM  Awake Alert, No new F.N deficits, Normal affect [place].AT,PERRAL Supple Neck,No JVD, No cervical lymphadenopathy appriciated.  Symmetrical Chest wall movement, Good air movement bilaterally, few rales RRR,No Gallops, Rubs or new Murmurs, No Parasternal Heave +ve B.Sounds, Abd Soft, No tenderness, No organomegaly appriciated, No rebound - guarding or rigidity. No Cyanosis, Clubbing or edema, No new Rash or bruise  Lab Results:  Data Reviewed: I have personally reviewed following labs and imaging studies  Recent Labs  Lab 03/26/20 0210 03/27/20 0228 03/28/20 0438 03/29/20 0408 03/30/20 0515  WBC 6.5 7.3 9.5 8.9 10.1  HGB 12.1 12.9 12.9 12.8 13.9  HCT 36.3 38.0 38.0 38.6 41.2  PLT 146* 165 231 211 265  MCV 87.1 87.4 89.0 86.9 87.7  MCH 29.0 29.7 30.2 28.8 29.6  MCHC 33.3 33.9 33.9 33.2 33.7  RDW 13.5 13.7 13.8 13.7 13.7  LYMPHSABS 0.5* 0.5* 0.6* 0.5* 0.8  MONOABS 0.3 0.3 0.6 0.5 0.7  EOSABS 0.0 0.0 0.0 0.0 0.0  BASOSABS 0.0 0.0 0.0 0.0 0.0    Recent Labs  Lab 03/24/20 1936 03/26/20 0210 03/26/20 0904 03/27/20 0228 03/27/20 0229 03/28/20 0438 03/29/20 0408 03/30/20 0515  NA  --  138  --  139  --  139 141 138  K  --  4.0  --  4.2  --  4.4 4.1 4.3  CL  --  105  --  105  --  105 102 100  CO2  --  22  --  25  --  25 28 26   GLUCOSE  --  153*  --  152*  --  154* 164* 146*  BUN  --  25*  --  25*  --  26* 33* 34*  CREATININE  --  1.55*  --  1.30*  --  1.43* 1.44* 1.34*  CALCIUM  --  9.2  --  9.9  --  10.5* 10.8* 11.0*  AST  --  27  --  28  --  25 18 20   ALT  --  20  --  21  --  23 23 22   ALKPHOS  --  54  --  53  --  61 57 61  BILITOT  --  0.7  --  0.3  --  0.1* 0.4 0.5  ALBUMIN  --  2.8*  --  2.6*  --  2.6* 2.7* 2.9*  MG  --   --   --  2.0  --  2.1 2.1  --   CRP  --  13.1*  --  6.4*  --  2.9* 2.5* 1.5*  DDIMER  --  0.83*  --  0.62*  --  0.51* 0.62* 0.43  PROCALCITON   < >  --  0.14 0.10  --  <0.10 <0.10 <0.10  BNP  --   --    --   --  97.3 116.2* 99.5 78.9   < > = values in this interval not displayed.       Recent Results (from the past 240 hour(s))  Novel Coronavirus, NAA ([facility])     Status: Abnormal   Collection Time: 03/24/20 10:13 AM   Specimen: Nasopharyngeal(NP) swabs in vial transport medium  Result Value Ref Range Status   SARS-CoV-2, NAA Detected (A) Not Detected Final    Comment: Patients who have a positive COVID-19 test result may now have treatment options. Treatment options are available for patients with mild to moderate symptoms and for hospitalized patients. Visit our website at [email or web address] for resources and information. This nucleic acid amplification test was developed and its performance characteristics determined by [employer]. Nucleic acid amplification tests include RT-PCR and TMA. This test has not been FDA cleared or approved. This test has been authorized by FDA under an Emergency Use Authorization (EUA). This test is only authorized for the duration of time the declaration that circumstances exist justifying the authorization of the emergency use of in vitro diagnostic tests for detection of SARS-CoV-2 virus and/or diagnosis of COVID-19 infection under section 564(b)(1) of the Act, 21 U.S.C. 016WFU-9(N) (1), unless the authorization is terminated or revoked sooner. When diagnostic testing is negativ e, the possibility of a false negative result should be considered in the context of a patient's recent exposures and the presence of clinical signs and symptoms consistent with COVID-19. An individual without symptoms of COVID-19 and who is not shedding SARS-CoV-2 virus would expect to have a negative (not detected) result in this assay.   SARS-COV-2, NAA 2 DAY TAT     Status: None   Collection Time: 03/24/20 10:13 AM  Result Value Ref Range Status   SARS-CoV-2, NAA 2 DAY TAT Performed  Final  Respiratory Panel by RT PCR (Flu A&B, Covid) -  Nasopharyngeal Swab     Status: Abnormal   Collection Time: 03/24/20 10:46 AM   Specimen: Nasopharyngeal Swab  Result Value Ref Range Status   SARS Coronavirus 2 by RT PCR POSITIVE (A) NEGATIVE Final    Comment: emailed L. Berdik RN 12:45 03/24/20 ([email or web address]) (NOTE) SARS-CoV-2 target nucleic acids are DETECTED.  SARS-CoV-2 RNA is generally detectable in upper respiratory specimens  during the acute phase of infection. Positive results are indicative of the presence of the identified virus, but do not rule out bacterial infection or co-infection with other pathogens not detected by the test. Clinical correlation with patient history and other diagnostic information is necessary to determine patient infection status. The expected result is Negative.  Fact Sheet for Patients:  PinkCheek.be  Fact Sheet for Healthcare Providers: GravelBags.it  This test is not yet approved or cleared by the [place] FDA and  has been authorized for detection and/or diagnosis of SARS-CoV-2 by FDA under an Emergency Use Authorization (EUA).  This EUA will remain in effect (meaning this test can be used) for the duration of  the COVID-19  declaration under Section 564(b)(1) of the Act, 21 U.S.C. section 360bbb-3(b)(1), unless the authorization is terminated or revoked sooner.      Influenza A by PCR NEGATIVE NEGATIVE Final   Influenza B by PCR NEGATIVE NEGATIVE Final    Comment: (NOTE) The Xpert Xpress SARS-CoV-2/FLU/RSV assay is intended as an aid in  the diagnosis of influenza from Nasopharyngeal swab specimens and  should not be used as a sole basis for treatment. Nasal washings and  aspirates are unacceptable for Xpert Xpress SARS-CoV-2/FLU/RSV  testing.  Fact  Sheet for Patients: PinkCheek.be  Fact Sheet for Healthcare Providers: GravelBags.it  This test is not yet approved  or cleared by the [place] FDA and  has been authorized for detection and/or diagnosis of SARS-CoV-2 by  FDA under an Emergency Use Authorization (EUA). This EUA will remain  in effect (meaning this test can be used) for the duration of the  Covid-19 declaration under Section 564(b)(1) of the Act, 21  U.S.C. section 360bbb-3(b)(1), unless the authorization is  terminated or revoked. Performed at [facility], [place] [address] [place], [place] [address]   Blood Culture (routine x 2)     Status: None   Collection Time: 03/24/20  7:20 PM   Specimen: BLOOD  Result Value Ref Range Status   Specimen Description BLOOD LEFT ANTECUBITAL  Final   Special Requests AEROBIC BOTTLE ONLY Blood Culture adequate volume  Final   Culture   Final    NO GROWTH 5 DAYS Performed at [facility], [place] [address] [place], [place] [address]    Report Status 03/29/2020 FINAL  Final  Blood Culture (routine x 2)     Status: Abnormal   Collection Time: 03/24/20  7:30 PM   Specimen: BLOOD LEFT WRIST  Result Value Ref Range Status   Specimen Description BLOOD LEFT WRIST  Final   Special Requests AEROBIC BOTTLE ONLY Blood Culture adequate volume  Final   Culture  Setup Time   Final    GRAM POSITIVE COCCI AEROBIC BOTTLE ONLY CRITICAL RESULT CALLED TO, READ BACK BY AND VERIFIED WITH: PHARMD J MILLEN 03/25/20 AT 2024 SK    Culture (A)  Final    STAPHYLOCOCCUS EPIDERMIDIS THE SIGNIFICANCE OF ISOLATING THIS ORGANISM FROM A SINGLE SET OF BLOOD CULTURES WHEN MULTIPLE SETS ARE DRAWN IS UNCERTAIN. PLEASE NOTIFY THE MICROBIOLOGY DEPARTMENT WITHIN ONE WEEK IF SPECIATION AND SENSITIVITIES ARE REQUIRED. Performed at [facility], [place] [address], [place],  [address]    Report Status 03/26/2020 FINAL  Final  Blood Culture ID Panel (Reflexed)     Status: Abnormal   Collection Time: 03/24/20  7:30 PM  Result Value Ref Range Status   Enterococcus faecalis NOT DETECTED NOT DETECTED Final    Enterococcus Faecium NOT DETECTED NOT DETECTED Final   Listeria monocytogenes NOT DETECTED NOT DETECTED Final   Staphylococcus species DETECTED (A) NOT DETECTED Final    Comment: CRITICAL RESULT CALLED TO, READ BACK BY AND VERIFIED WITH: PHARMD J MILLEN 03/25/20 AT 2024 SK    Staphylococcus aureus (BCID) NOT DETECTED NOT DETECTED Final   Staphylococcus epidermidis DETECTED (A) NOT DETECTED Final    Comment: CRITICAL RESULT CALLED TO, READ BACK BY AND VERIFIED WITH: PHARMD J MILLEN 03/25/20 AT 2024 SK    Staphylococcus lugdunensis NOT DETECTED NOT DETECTED Final   Streptococcus species NOT DETECTED NOT DETECTED Final   Streptococcus agalactiae NOT DETECTED NOT DETECTED Final   Streptococcus pneumoniae NOT DETECTED NOT DETECTED Final   Streptococcus pyogenes NOT DETECTED NOT DETECTED Final   A.calcoaceticus-baumannii NOT DETECTED NOT DETECTED Final   Bacteroides fragilis NOT DETECTED NOT DETECTED Final   Enterobacterales NOT DETECTED NOT DETECTED Final   Enterobacter cloacae complex NOT DETECTED NOT DETECTED Final   Escherichia coli NOT DETECTED NOT DETECTED Final   Klebsiella aerogenes NOT DETECTED NOT DETECTED Final   Klebsiella oxytoca NOT DETECTED NOT DETECTED Final   Klebsiella pneumoniae NOT DETECTED NOT DETECTED Final   Proteus species NOT DETECTED NOT DETECTED Final   Salmonella species NOT  DETECTED NOT DETECTED Final   Serratia marcescens NOT DETECTED NOT DETECTED Final   Haemophilus influenzae NOT DETECTED NOT DETECTED Final   Neisseria meningitidis NOT DETECTED NOT DETECTED Final   Pseudomonas aeruginosa NOT DETECTED NOT DETECTED Final   Stenotrophomonas maltophilia NOT DETECTED NOT DETECTED Final   Candida albicans NOT DETECTED NOT DETECTED Final   Candida auris NOT DETECTED NOT DETECTED Final   Candida glabrata NOT DETECTED NOT DETECTED Final   Candida krusei NOT DETECTED NOT DETECTED Final   Candida parapsilosis NOT DETECTED NOT DETECTED Final   Candida tropicalis NOT  DETECTED NOT DETECTED Final   Cryptococcus neoformans/gattii NOT DETECTED NOT DETECTED Final   Methicillin resistance mecA/C NOT DETECTED NOT DETECTED Final    Comment: Performed at [facility], [place] [address] [place], [place] [address]  Culture, blood (routine x 2)     Status: None   Collection Time: 03/25/20 10:02 PM   Specimen: BLOOD LEFT ARM  Result Value Ref Range Status   Specimen Description BLOOD LEFT ARM  Final   Special Requests   Final    BOTTLES DRAWN AEROBIC AND ANAEROBIC Blood Culture adequate volume   Culture   Final    NO GROWTH 5 DAYS Performed at [facility], [place] [address] [place], [place] [address]    Report Status 03/30/2020 FINAL  Final  Culture, blood (routine x 2)     Status: None   Collection Time: 03/25/20 10:03 PM   Specimen: BLOOD LEFT HAND  Result Value Ref Range Status   Specimen Description BLOOD LEFT HAND  Final   Special Requests   Final    BOTTLES DRAWN AEROBIC AND ANAEROBIC Blood Culture adequate volume   Culture   Final    NO GROWTH 5 DAYS Performed at [facility], [place] [address] [place], [place] [address]    Report Status 03/30/2020 FINAL  Final      Radiology Studies: No results found.   LOS: 6 days   [facility] Pager on [email or web address]  03/30/2020, 11:39 AM

## 2020-03-30 NOTE — Progress Notes (Signed)
RT NOTE: Pt placed on [place] per MD order. NRB mask placed over [place] as patient is mouth breathing. Vitals are stable. RT will continue to monitor.

## 2020-03-30 NOTE — Progress Notes (Signed)
Pt has been up to chair for lunch and dinner she had c/o of headache tylenol was given no other complaints will continue to monitor.

## 2020-03-30 NOTE — Progress Notes (Signed)
Physical Therapy Treatment Patient Details Name: Amanda Ross MRN: [id number] DOB: [birth year] Today's Date: 03/30/2020    History of Present Illness 74 y.o. female with history of asthma, HTN, OSA, primary hypothyroidism and left thyroid nodule resented with myalgias, chills, intermittent cough) shortness of breath.  She tested positive for COVID-19 at her PCP office.  She apparently desaturated into the mid 80s and was sent over to the emergency department.  Chest x-ray did not show any significant findings.  CT angiogram did not show PE but did show multifocal patchy airspace opacities right greater than left.    PT Comments    Limited by [place] to doing standing exercise and marching in place.  Pt tolerated this very well, keeping SpO2 between 88-97% on 20L HHFNC with max HR 93 bpm.   Follow Up Recommendations  Home health PT;Supervision/Assistance - 24 hour     Equipment Recommendations  None recommended by PT    Recommendations for Other Services       Precautions / Restrictions Precautions Precautions: Fall Precaution Comments: watch O2    Mobility  Bed Mobility               General bed mobility comments: sitting in the chair on arrival  Transfers Overall transfer level: Modified independent                  Ambulation/Gait Ambulation/Gait assistance: Supervision           General Gait Details: not able today due to on [place], but did standing exercise and brisk marching in place.   Stairs             Wheelchair Mobility    Modified Rankin (Stroke Patients Only)       Balance     Sitting balance-Leahy Scale: Good       Standing balance-Leahy Scale: Fair                              Cognition Arousal/Alertness: Awake/alert Behavior During Therapy: WFL for tasks assessed/performed Overall Cognitive Status: Within Functional Limits for tasks assessed                                         Exercises General Exercises - Lower Extremity Hip ABduction/ADduction: AROM;Strengthening;Both;10 reps;Standing Hip Flexion/Marching: AROM;Strengthening;Both;10 reps;Standing Toe Raises: AROM;Both;Strengthening;Standing;10 reps Heel Raises: AROM;Strengthening;Both;10 reps;Standing Mini-Sqauts: AROM;Strengthening;10 reps;Standing Other Exercises Other Exercises: 50 steps marching in place Other Exercises: standing at an angle push ups. x10     General Comments General comments (skin integrity, edema, etc.): During standing exercises and marching in place, SpO2 on 20L HHFNC and 15L on NRB stay in acceptable ranges from low of 885 to high of 97%  Max HR 93 bpm      Pertinent Vitals/Pain Pain Assessment: Faces Faces Pain Scale: No hurt Pain Intervention(s): Monitored during session    Home Living                      Prior Function            PT Goals (current goals can now be found in the care plan section) Acute Rehab PT Goals PT Goal Formulation: With patient Time For Goal Achievement: 04/10/20 Potential to Achieve Goals: Good Progress towards PT goals: Progressing toward goals    Frequency  Min 3X/week      PT Plan Current plan remains appropriate    Co-evaluation              AM-PAC PT "6 Clicks" Mobility   Outcome Measure  Help needed turning from your back to your side while in a flat bed without using bedrails?: A Little Help needed moving from lying on your back to sitting on the side of a flat bed without using bedrails?: A Little Help needed moving to and from a bed to a chair (including a wheelchair)?: None Help needed standing up from a chair using your arms (e.g., wheelchair or bedside chair)?: None Help needed to walk in hospital room?: None Help needed climbing 3-5 steps with a railing? : None 6 Click Score: 22    End of Session Equipment Utilized During Treatment: Oxygen Activity Tolerance: Patient tolerated treatment  well Patient left: in chair;with call bell/phone within reach Nurse Communication: Mobility status PT Visit Diagnosis: Other abnormalities of gait and mobility (R26.89);Muscle weakness (generalized) (M62.81)     Time: 9292-4462 PT Time Calculation (min) (ACUTE ONLY): 24 min  Charges:  $Therapeutic Exercise: 8-22 mins $Therapeutic Activity: 8-22 mins                     03/30/2020  Ginger Carne., PT Acute Rehabilitation Services [phone]  (pager) [phone]  (office)   Tessie Fass Asalee [facility] 03/30/2020, 6:11 PM

## 2020-03-31 ENCOUNTER — Inpatient Hospital Stay (HOSPITAL_COMMUNITY): Payer: Medicare Other

## 2020-03-31 DIAGNOSIS — U071 COVID-19: Secondary | ICD-10-CM | POA: Diagnosis not present

## 2020-03-31 DIAGNOSIS — J9601 Acute respiratory failure with hypoxia: Secondary | ICD-10-CM | POA: Diagnosis not present

## 2020-03-31 LAB — COMPREHENSIVE METABOLIC PANEL
ALT: 21 U/L (ref 0–44)
AST: 16 U/L (ref 15–41)
Albumin: 2.8 g/dL — ABNORMAL LOW (ref 3.5–5.0)
Alkaline Phosphatase: 58 U/L (ref 38–126)
Anion gap: 12 (ref 5–15)
BUN: 40 mg/dL — ABNORMAL HIGH (ref 8–23)
CO2: 28 mmol/L (ref 22–32)
Calcium: 11 mg/dL — ABNORMAL HIGH (ref 8.9–10.3)
Chloride: 99 mmol/L (ref 98–111)
Creatinine, Ser: 1.23 mg/dL — ABNORMAL HIGH (ref 0.44–1.00)
GFR, Estimated: 43 mL/min — ABNORMAL LOW (ref >60)
Glucose, Bld: 143 mg/dL — ABNORMAL HIGH (ref 70–99)
Potassium: 4.4 mmol/L (ref 3.5–5.1)
Sodium: 139 mmol/L (ref 135–145)
Total Bilirubin: 0.8 mg/dL (ref 0.3–1.2)
Total Protein: 6 g/dL — ABNORMAL LOW (ref 6.5–8.1)

## 2020-03-31 LAB — C-REACTIVE PROTEIN: CRP: 0.7 mg/dL (ref <1.0)

## 2020-03-31 LAB — CBC WITH DIFFERENTIAL/PLATELET
Abs Immature Granulocytes: 0.13 10*3/uL — ABNORMAL HIGH (ref 0.00–0.07)
Basophils Absolute: 0 10*3/uL (ref 0.0–0.1)
Basophils Relative: 0 %
Eosinophils Absolute: 0 10*3/uL (ref 0.0–0.5)
Eosinophils Relative: 0 %
HCT: 39.8 % (ref 36.0–46.0)
Hemoglobin: 13.6 g/dL (ref 12.0–15.0)
Immature Granulocytes: 1 %
Lymphocytes Relative: 9 %
Lymphs Abs: 0.8 10*3/uL (ref 0.7–4.0)
MCH: 29.8 pg (ref 26.0–34.0)
MCHC: 34.2 g/dL (ref 30.0–36.0)
MCV: 87.1 fL (ref 80.0–100.0)
Monocytes Absolute: 0.6 10*3/uL (ref 0.1–1.0)
Monocytes Relative: 7 %
Neutro Abs: 7.5 10*3/uL (ref 1.7–7.7)
Neutrophils Relative %: 83 %
Platelets: 314 10*3/uL (ref 150–400)
RBC: 4.57 MIL/uL (ref 3.87–5.11)
RDW: 13.6 % (ref 11.5–15.5)
WBC: 9.1 10*3/uL (ref 4.0–10.5)
nRBC: 0 % (ref 0.0–0.2)

## 2020-03-31 LAB — BRAIN NATRIURETIC PEPTIDE: B Natriuretic Peptide: 94.2 pg/mL (ref 0.0–100.0)

## 2020-03-31 LAB — D-DIMER, QUANTITATIVE: D-Dimer, Quant: 0.44 ug/mL-FEU (ref 0.00–0.50)

## 2020-03-31 LAB — PROCALCITONIN: Procalcitonin: <0.1 ng/mL

## 2020-03-31 NOTE — Progress Notes (Signed)
PROGRESS NOTE  Amanda Ross MGQ:[id number] DOB: 1946-05-30 DOA: 03/24/2020  PCP: [name], MD  Brief History/Interval Summary: 74 y.o. female with history of asthma, HTN, OSA, primary hypothyroidism and left thyroid nodule resented with myalgias, chills, intermittent cough) shortness of breath.  She tested positive for COVID-19 at her PCP office.  She apparently desaturated into the mid 80s and was sent over to the emergency department.  Chest x-ray did not show any significant findings.  CT angiogram did not show PE but did show multifocal patchy airspace opacities right greater than left.    Reason for Visit: Pneumonia due to COVID-19.  Acute respiratory failure with hypoxia.  Consultants: None  Procedures: None  Antibiotics: Anti-infectives (From admission, onward)   Start     Dose/Rate Route Frequency Ordered Stop   03/25/20 1000  remdesivir 100 mg in sodium chloride 0.9 % 100 mL IVPB       "Followed by" Linked Group Details   100 mg 200 mL/hr over 30 Minutes Intravenous Daily 03/24/20 1533 03/28/20 0900   03/24/20 1545  remdesivir 200 mg in sodium chloride 0.9% 250 mL IVPB       "Followed by" Linked Group Details   200 mg 580 mL/hr over 30 Minutes Intravenous Once 03/24/20 1533 03/24/20 1940      Subjective/Interval History:  Patient in bed, appears comfortable, denies any headache, no fever, no chest pain or pressure, no shortness of breath , no abdominal pain. No focal weakness.  Assessment/Plan:  Acute Hypoxic Resp. Failure/Pneumonia due to COVID-19 - she is fully vaccinated and unfortunately has breakthrough disease, initially responded well to IV steroids and Remdesivir however on 03/29/2020 more hypoxic, he was given Actemra on 03/29/2020 early in the morning, currently on HHFL and much better.  As needed Lasix being used as well, overall still quite tenuous continue to monitor closely.  Encouraged her to sit up in chair in the daytime use I-S and flutter  valve for pulmonary toiletry and prone at night.  SpO2: 93 % O2 Flow Rate (L/min): 25 L/min FiO2 (%): 70 %   Recent Labs  Lab 03/24/20 1013 03/24/20 1039 03/24/20 1046 03/24/20 1152 03/26/20 0210 03/26/20 0904 03/27/20 0228 03/27/20 0229 03/28/20 0438 03/29/20 0408 03/30/20 0515 03/31/20 0612  WBC  --    < >  --    < >   < >  --  7.3  --  9.5 8.9 10.1 9.1  HGB  --    < >  --    < >   < >  --  12.9  --  12.9 12.8 13.9 13.6  HCT  --    < >  --    < >   < >  --  38.0  --  38.0 38.6 41.2 39.8  PLT  --    < >  --    < >   < >  --  165  --  231 211 265 314  CRP  --   --   --    < >   < >  --  6.4*  --  2.9* 2.5* 1.5* 0.7  BNP  --   --   --   --   --   --   --  97.3 116.2* 99.5 78.9 94.2  DDIMER  --   --   --    < >   < >  --  0.62*  --  0.51* 0.62* 0.43 0.44  PROCALCITON  --   --   --    < >  --  0.14 0.10  --  <0.10 <0.10 <0.10  --   AST  --   --   --    < >   < >  --  28  --  25 18 20 16   ALT  --   --   --    < >   < >  --  21  --  23 23 22 21   ALKPHOS  --   --   --    < >   < >  --  53  --  61 57 61 58  BILITOT  --   --   --    < >   < >  --  0.3  --  0.1* 0.4 0.5 0.8  ALBUMIN  --   --   --    < >   < >  --  2.6*  --  2.6* 2.7* 2.9* 2.8*  SARSCOV2NAA Detected*  --  POSITIVE*  --   --   --   --   --   --   --   --   --    < > = values in this interval not displayed.      1 out of 2 staph epidermis positive blood culture.  Contaminant.     Acute kidney injury on chronic kidney disease stage IIIb -   her baseline creatinine is around 1.4, AKI likely due to ATN caused by acute infection and dehydration, improved, continue to monitor  Obstructive sleep apnea - Continue with oxygen.  History of asthma - Steroids and inhalers as above.  Essential hypertension - Continue Toprol and Norvasc combination, added IV hydralazine as needed.    History of overactive bladder - Monitor. Continue oxybutynin.  Mild Thrombocytopenia - Due to viral infection. Resolved.  Morbid obesity -  BMI  Of > 39 Follow with PCP for weight loss.    DVT Prophylaxis: Lovenox Code Status: Full code Family Communication: Discussed with patient.  Called her husband [name]   [phone] and updated 03/28/20, 03/29/20, 03/31/20  Disposition Plan: Hopefully return home when improved  Status is: Inpatient  Remains inpatient appropriate because:IV treatments appropriate due to intensity of illness or inability to take PO and Inpatient level of care appropriate due to severity of illness   Dispo: The patient is from: Home              Anticipated d/c is to: Home              Anticipated d/c date is: 2 days              Patient currently is not medically stable to d/c.   Medications:  Scheduled: . amLODipine  10 mg Oral Daily  . vitamin C  500 mg Oral Daily  . aspirin EC  81 mg Oral Daily  . cholecalciferol  2,000 Units Oral Daily  . enoxaparin (LOVENOX) injection  40 mg Subcutaneous Q12H  . hydrALAZINE  25 mg Oral Q8H  . Ipratropium-Albuterol  1 puff Inhalation Q6H  . methylPREDNISolone (SOLU-MEDROL) injection  60 mg Intravenous Q12H  . metoprolol tartrate  25 mg Oral BID  . oxybutynin  10 mg Oral QHS  . pantoprazole  40 mg Oral Daily  . traZODone  50 mg Oral QHS  . zinc sulfate  220 mg Oral Daily   Continuous:  MPN:[id number], baclofen,  bisacodyl, chlorpheniramine-HYDROcodone, guaiFENesin-dextromethorphan, hydrALAZINE, hydroxypropyl methylcellulose / hypromellose, magnesium citrate, [DISCONTINUED] ondansetron **OR** ondansetron (ZOFRAN) IV, polyethylene glycol, simethicone, sodium chloride, zolpidem   Objective:  Vital Signs  Vitals:   03/31/20 0245 03/31/20 0618 03/31/20 0755 03/31/20 0836  BP:  126/73  (!) 138/93  Pulse: 69  78 89  Resp: 20  (!) 22 20  Temp:    98.6 F (37 C)  TempSrc:      SpO2: 93%  93% 93%  Weight:      Height:        Intake/Output Summary (Last 24 hours) at 03/31/2020 0937 Last data filed at 03/30/2020 2026 Gross per 24 hour  Intake  120 ml  Output --  Net 120 ml   Filed Weights   03/24/20 1033  Weight: 102.1 kg    EXAM  Awake Alert, No new F.N deficits, Normal affect Stephens.AT,PERRAL Supple Neck,No JVD, No cervical lymphadenopathy appriciated.  Symmetrical Chest wall movement, Good air movement bilaterally, CTAB RRR,No Gallops, Rubs or new Murmurs, No Parasternal Heave +ve B.Sounds, Abd Soft, No tenderness, No organomegaly appriciated, No rebound - guarding or rigidity. No Cyanosis, Clubbing or edema, No new Rash or bruise   Lab Results:  Data Reviewed: I have personally reviewed following labs and imaging studies  Recent Labs  Lab 03/27/20 0228 03/28/20 0438 03/29/20 0408 03/30/20 0515 03/31/20 0612  WBC 7.3 9.5 8.9 10.1 9.1  HGB 12.9 12.9 12.8 13.9 13.6  HCT 38.0 38.0 38.6 41.2 39.8  PLT 165 231 211 265 314  MCV 87.4 89.0 86.9 87.7 87.1  MCH 29.7 30.2 28.8 29.6 29.8  MCHC 33.9 33.9 33.2 33.7 34.2  RDW 13.7 13.8 13.7 13.7 13.6  LYMPHSABS 0.5* 0.6* 0.5* 0.8 0.8  MONOABS 0.3 0.6 0.5 0.7 0.6  EOSABS 0.0 0.0 0.0 0.0 0.0  BASOSABS 0.0 0.0 0.0 0.0 0.0    Recent Labs  Lab 03/26/20 0210 03/26/20 0904 03/27/20 0228 03/27/20 0229 03/28/20 0438 03/29/20 0408 03/30/20 0515 03/31/20 0612  NA   < >  --  139  --  139 141 138 139  K   < >  --  4.2  --  4.4 4.1 4.3 4.4  CL   < >  --  105  --  105 102 100 99  CO2   < >  --  25  --  25 28 26 28   GLUCOSE   < >  --  152*  --  154* 164* 146* 143*  BUN   < >  --  25*  --  26* 33* 34* 40*  CREATININE   < >  --  1.30*  --  1.43* 1.44* 1.34* 1.23*  CALCIUM   < >  --  9.9  --  10.5* 10.8* 11.0* 11.0*  AST   < >  --  28  --  25 18 20 16   ALT   < >  --  21  --  23 23 22 21   ALKPHOS   < >  --  53  --  61 57 61 58  BILITOT   < >  --  0.3  --  0.1* 0.4 0.5 0.8  ALBUMIN   < >  --  2.6*  --  2.6* 2.7* 2.9* 2.8*  MG  --   --  2.0  --  2.1 2.1  --   --   CRP   < >  --  6.4*  --  2.9* 2.5* 1.5* 0.7  DDIMER   < >  --  0.62*  --  0.51* 0.62* 0.43 0.44  PROCALCITON   --  0.14 0.10  --  <0.10 <0.10 <0.10  --   BNP  --   --   --  97.3 116.2* 99.5 78.9 94.2   < > = values in this interval not displayed.       Recent Results (from the past 240 hour(s))  Novel Coronavirus, NAA ([facility])     Status: Abnormal   Collection Time: 03/24/20 10:13 AM   Specimen: Nasopharyngeal(NP) swabs in vial transport medium  Result Value Ref Range Status   SARS-CoV-2, NAA Detected (A) Not Detected Final    Comment: Patients who have a positive COVID-19 test result may now have treatment options. Treatment options are available for patients with mild to moderate symptoms and for hospitalized patients. Visit our website at [email or web address] for resources and information. This nucleic acid amplification test was developed and its performance characteristics determined by [employer]. Nucleic acid amplification tests include RT-PCR and TMA. This test has not been FDA cleared or approved. This test has been authorized by FDA under an Emergency Use Authorization (EUA). This test is only authorized for the duration of time the declaration that circumstances exist justifying the authorization of the emergency use of in vitro diagnostic tests for detection of SARS-CoV-2 virus and/or diagnosis of COVID-19 infection under section 564(b)(1) of the Act, 21 U.S.C. 466ZLD-3(T) (1), unless the authorization is terminated or revoked sooner. When diagnostic testing is negativ e, the possibility of a false negative result should be considered in the context of a patient's recent exposures and the presence of clinical signs and symptoms consistent with COVID-19. An individual without symptoms of COVID-19 and who is not shedding SARS-CoV-2 virus would expect to have a negative (not detected) result in this assay.   SARS-COV-2, NAA 2 DAY TAT     Status: None   Collection Time: 03/24/20 10:13 AM  Result Value Ref Range Status   SARS-CoV-2, NAA 2 DAY TAT Performed   Final  Respiratory Panel by RT PCR (Flu A&B, Covid) - Nasopharyngeal Swab     Status: Abnormal   Collection Time: 03/24/20 10:46 AM   Specimen: Nasopharyngeal Swab  Result Value Ref Range Status   SARS Coronavirus 2 by RT PCR POSITIVE (A) NEGATIVE Final    Comment: emailed L. Berdik RN 12:45 03/24/20 ([email or web address]) (NOTE) SARS-CoV-2 target nucleic acids are DETECTED.  SARS-CoV-2 RNA is generally detectable in upper respiratory specimens  during the acute phase of infection. Positive results are indicative of the presence of the identified virus, but do not rule out bacterial infection or co-infection with other pathogens not detected by the test. Clinical correlation with patient history and other diagnostic information is necessary to determine patient infection status. The expected result is Negative.  Fact Sheet for Patients:  PinkCheek.be  Fact Sheet for Healthcare Providers: GravelBags.it  This test is not yet approved or cleared by the [place] FDA and  has been authorized for detection and/or diagnosis of SARS-CoV-2 by FDA under an Emergency Use Authorization (EUA).  This EUA will remain in effect (meaning this test can be used) for the duration of  the COVID-19  declaration under Section 564(b)(1) of the Act, 21 U.S.C. section 360bbb-3(b)(1), unless the authorization is terminated or revoked sooner.      Influenza A by PCR NEGATIVE NEGATIVE Final   Influenza B by PCR NEGATIVE NEGATIVE Final    Comment: (NOTE) The Xpert  Xpress SARS-CoV-2/FLU/RSV assay is intended as an aid in  the diagnosis of influenza from Nasopharyngeal swab specimens and  should not be used as a sole basis for treatment. Nasal washings and  aspirates are unacceptable for Xpert Xpress SARS-CoV-2/FLU/RSV  testing.  Fact Sheet for Patients: PinkCheek.be  Fact Sheet for Healthcare  Providers: GravelBags.it  This test is not yet approved or cleared by the [place] FDA and  has been authorized for detection and/or diagnosis of SARS-CoV-2 by  FDA under an Emergency Use Authorization (EUA). This EUA will remain  in effect (meaning this test can be used) for the duration of the  Covid-19 declaration under Section 564(b)(1) of the Act, 21  U.S.C. section 360bbb-3(b)(1), unless the authorization is  terminated or revoked. Performed at [facility], [place] [address] [place], [place] [address]   Blood Culture (routine x 2)     Status: None   Collection Time: 03/24/20  7:20 PM   Specimen: BLOOD  Result Value Ref Range Status   Specimen Description BLOOD LEFT ANTECUBITAL  Final   Special Requests AEROBIC BOTTLE ONLY Blood Culture adequate volume  Final   Culture   Final    NO GROWTH 5 DAYS Performed at [facility], [place] [address], [place], [place] [address]    Report Status 03/29/2020 FINAL  Final  Blood Culture (routine x 2)     Status: Abnormal   Collection Time: 03/24/20  7:30 PM   Specimen: BLOOD LEFT WRIST  Result Value Ref Range Status   Specimen Description BLOOD LEFT WRIST  Final   Special Requests AEROBIC BOTTLE ONLY Blood Culture adequate volume  Final   Culture  Setup Time   Final    GRAM POSITIVE COCCI AEROBIC BOTTLE ONLY CRITICAL RESULT CALLED TO, READ BACK BY AND VERIFIED WITH: PHARMD J MILLEN 03/25/20 AT 2024 SK    Culture (A)  Final    STAPHYLOCOCCUS EPIDERMIDIS THE SIGNIFICANCE OF ISOLATING THIS ORGANISM FROM A SINGLE SET OF BLOOD CULTURES WHEN MULTIPLE SETS ARE DRAWN IS UNCERTAIN. PLEASE NOTIFY THE MICROBIOLOGY DEPARTMENT WITHIN ONE WEEK IF SPECIATION AND SENSITIVITIES ARE REQUIRED. Performed at [facility], [place] [address], [place], [place] [address]    Report Status 03/26/2020 FINAL  Final  Blood Culture ID Panel (Reflexed)     Status: Abnormal   Collection Time: 03/24/20  7:30 PM  Result  Value Ref Range Status   Enterococcus faecalis NOT DETECTED NOT DETECTED Final   Enterococcus Faecium NOT DETECTED NOT DETECTED Final   Listeria monocytogenes NOT DETECTED NOT DETECTED Final   Staphylococcus species DETECTED (A) NOT DETECTED Final    Comment: CRITICAL RESULT CALLED TO, READ BACK BY AND VERIFIED WITH: PHARMD J MILLEN 03/25/20 AT 2024 SK    Staphylococcus aureus (BCID) NOT DETECTED NOT DETECTED Final   Staphylococcus epidermidis DETECTED (A) NOT DETECTED Final    Comment: CRITICAL RESULT CALLED TO, READ BACK BY AND VERIFIED WITH: PHARMD J MILLEN 03/25/20 AT 2024 SK    Staphylococcus lugdunensis NOT DETECTED NOT DETECTED Final   Streptococcus species NOT DETECTED NOT DETECTED Final   Streptococcus agalactiae NOT DETECTED NOT DETECTED Final   Streptococcus pneumoniae NOT DETECTED NOT DETECTED Final   Streptococcus pyogenes NOT DETECTED NOT DETECTED Final   A.calcoaceticus-baumannii NOT DETECTED NOT DETECTED Final   Bacteroides fragilis NOT DETECTED NOT DETECTED Final   Enterobacterales NOT DETECTED NOT DETECTED Final   Enterobacter cloacae complex NOT DETECTED NOT DETECTED Final   Escherichia coli NOT  DETECTED NOT DETECTED Final   Klebsiella aerogenes NOT DETECTED NOT DETECTED Final   Klebsiella oxytoca NOT DETECTED NOT DETECTED Final   Klebsiella pneumoniae NOT DETECTED NOT DETECTED Final   Proteus species NOT DETECTED NOT DETECTED Final   Salmonella species NOT DETECTED NOT DETECTED Final   Serratia marcescens NOT DETECTED NOT DETECTED Final   Haemophilus influenzae NOT DETECTED NOT DETECTED Final   Neisseria meningitidis NOT DETECTED NOT DETECTED Final   Pseudomonas aeruginosa NOT DETECTED NOT DETECTED Final   Stenotrophomonas maltophilia NOT DETECTED NOT DETECTED Final   Candida albicans NOT DETECTED NOT DETECTED Final   Candida auris NOT DETECTED NOT DETECTED Final   Candida glabrata NOT DETECTED NOT DETECTED Final   Candida krusei NOT DETECTED NOT DETECTED Final    Candida parapsilosis NOT DETECTED NOT DETECTED Final   Candida tropicalis NOT DETECTED NOT DETECTED Final   Cryptococcus neoformans/gattii NOT DETECTED NOT DETECTED Final   Methicillin resistance mecA/C NOT DETECTED NOT DETECTED Final    Comment: Performed at [facility], [place] [address] [place], [place] [address]  Culture, blood (routine x 2)     Status: None   Collection Time: 03/25/20 10:02 PM   Specimen: BLOOD LEFT ARM  Result Value Ref Range Status   Specimen Description BLOOD LEFT ARM  Final   Special Requests   Final    BOTTLES DRAWN AEROBIC AND ANAEROBIC Blood Culture adequate volume   Culture   Final    NO GROWTH 5 DAYS Performed at [facility], [place] [address], [place], [place] [address]    Report Status 03/30/2020 FINAL  Final  Culture, blood (routine x 2)     Status: None   Collection Time: 03/25/20 10:03 PM   Specimen: BLOOD LEFT HAND  Result Value Ref Range Status   Specimen Description BLOOD LEFT HAND  Final   Special Requests   Final    BOTTLES DRAWN AEROBIC AND ANAEROBIC Blood Culture adequate volume   Culture   Final    NO GROWTH 5 DAYS Performed at [facility], [place] [address], [place], [place] [address]    Report Status 03/30/2020 FINAL  Final      Radiology Studies: DG Chest Port 1 View  Result Date: 03/31/2020 CLINICAL DATA:  Shortness of breath. EXAM: PORTABLE CHEST 1 VIEW COMPARISON:  03/28/2020 FINDINGS: Mild cardiomegaly stable. Aortic atherosclerotic calcification noted. Pulmonary infiltrate is again seen at the right base, with mild worsening since previous study. There is also mild airspace disease in the peripheral left mid lung which is unchanged in appearance. No definite pleural effusion or pneumothorax. IMPRESSION: Mild worsening of right basilar pulmonary infiltrate. Stable mild airspace disease in peripheral left mid lung. Electronically Signed   By: Marlaine Hind M.D.   On: 03/31/2020 08:07     LOS: 7 days   [facility] Pager on [email or web address]  03/31/2020, 9:37 AM

## 2020-03-31 NOTE — Progress Notes (Signed)
Physical Therapy Treatment Patient Details Name: Amanda Ross MRN: [id number] DOB: [birth year] Today's Date: 03/31/2020    History of Present Illness 74 y.o. female with history of asthma, HTN, OSA, primary hypothyroidism and left thyroid nodule resented with myalgias, chills, intermittent cough) shortness of breath.  She tested positive for COVID-19 at her PCP office.  She apparently desaturated into the mid 80s and was sent over to the emergency department.  Chest x-ray did not show any significant findings.  CT angiogram did not show PE but did show multifocal patchy airspace opacities right greater than left.    PT Comments    As usual, pt willing to exercise.  Complete standing exercise program, pt needing no support.  SpO2 running lower today, but unsure how reliable due to sensor over fingernail polish. Despite this, sats in the mid 80's and pt responding well to the activity.    Follow Up Recommendations  Home health PT;Supervision/Assistance - 24 hour     Equipment Recommendations  None recommended by PT    Recommendations for Other Services       Precautions / Restrictions Precautions Precautions: Fall Precaution Comments: watch O2    Mobility  Bed Mobility               General bed mobility comments: sitting in the chair on arrival  Transfers Overall transfer level: Modified independent                  Ambulation/Gait Ambulation/Gait assistance: Supervision           General Gait Details: not able today due to on [place], but did standing exercise and brisk marching in place.   Stairs             Wheelchair Mobility    Modified Rankin (Stroke Patients Only)       Balance     Sitting balance-Leahy Scale: Good       Standing balance-Leahy Scale: Fair                              Cognition Arousal/Alertness: Awake/alert Behavior During Therapy: WFL for tasks assessed/performed Overall Cognitive Status:  Within Functional Limits for tasks assessed                                        Exercises General Exercises - Lower Extremity Hip ABduction/ADduction: AROM;Strengthening;Both;10 reps;Standing Hip Flexion/Marching: AROM;Strengthening;Both;10 reps;Standing Toe Raises: AROM;Both;Strengthening;Standing;10 reps Heel Raises: AROM;Strengthening;Both;10 reps;Standing Mini-Sqauts: AROM;Strengthening;10 reps;Standing Other Exercises Other Exercises: standing at an angle push ups. x10     General Comments        Pertinent Vitals/Pain Pain Assessment: Faces Faces Pain Scale: No hurt (except agrees she is "butt sore") Pain Intervention(s): Monitored during session    Home Living                      Prior Function            PT Goals (current goals can now be found in the care plan section) Acute Rehab PT Goals PT Goal Formulation: With patient Time For Goal Achievement: 04/10/20 Potential to Achieve Goals: Good Progress towards PT goals: Progressing toward goals    Frequency    Min 3X/week      PT Plan Current plan remains appropriate    Co-evaluation  AM-PAC PT "6 Clicks" Mobility   Outcome Measure  Help needed turning from your back to your side while in a flat bed without using bedrails?: A Little Help needed moving from lying on your back to sitting on the side of a flat bed without using bedrails?: A Little Help needed moving to and from a bed to a chair (including a wheelchair)?: None Help needed standing up from a chair using your arms (e.g., wheelchair or bedside chair)?: None Help needed to walk in hospital room?: None Help needed climbing 3-5 steps with a railing? : None 6 Click Score: 22    End of Session Equipment Utilized During Treatment: Oxygen Activity Tolerance: Patient tolerated treatment well Patient left: in chair;with call bell/phone within reach Nurse Communication: Mobility status PT Visit  Diagnosis: Other abnormalities of gait and mobility (R26.89);Muscle weakness (generalized) (M62.81)     Time: 2585-2778 PT Time Calculation (min) (ACUTE ONLY): 19 min  Charges:  $Therapeutic Exercise: 8-22 mins                     03/31/2020  Ginger Carne., PT Acute Rehabilitation Services [phone]  (pager) [phone]  (office)   Tessie Fass Jace Fermin 03/31/2020, 5:43 PM

## 2020-03-31 NOTE — Plan of Care (Signed)

## 2020-04-01 DIAGNOSIS — U071 COVID-19: Secondary | ICD-10-CM | POA: Diagnosis not present

## 2020-04-01 DIAGNOSIS — J9601 Acute respiratory failure with hypoxia: Secondary | ICD-10-CM | POA: Diagnosis not present

## 2020-04-01 LAB — D-DIMER, QUANTITATIVE: D-Dimer, Quant: 0.43 ug/mL-FEU (ref 0.00–0.50)

## 2020-04-01 LAB — COMPREHENSIVE METABOLIC PANEL
ALT: 21 U/L (ref 0–44)
AST: 16 U/L (ref 15–41)
Albumin: 2.7 g/dL — ABNORMAL LOW (ref 3.5–5.0)
Alkaline Phosphatase: 56 U/L (ref 38–126)
Anion gap: 12 (ref 5–15)
BUN: 40 mg/dL — ABNORMAL HIGH (ref 8–23)
CO2: 26 mmol/L (ref 22–32)
Calcium: 11.1 mg/dL — ABNORMAL HIGH (ref 8.9–10.3)
Chloride: 100 mmol/L (ref 98–111)
Creatinine, Ser: 1.29 mg/dL — ABNORMAL HIGH (ref 0.44–1.00)
GFR, Estimated: 41 mL/min — ABNORMAL LOW (ref >60)
Glucose, Bld: 143 mg/dL — ABNORMAL HIGH (ref 70–99)
Potassium: 4.5 mmol/L (ref 3.5–5.1)
Sodium: 138 mmol/L (ref 135–145)
Total Bilirubin: 0.8 mg/dL (ref 0.3–1.2)
Total Protein: 5.7 g/dL — ABNORMAL LOW (ref 6.5–8.1)

## 2020-04-01 LAB — CBC WITH DIFFERENTIAL/PLATELET
Abs Immature Granulocytes: 0.12 10*3/uL — ABNORMAL HIGH (ref 0.00–0.07)
Basophils Absolute: 0 10*3/uL (ref 0.0–0.1)
Basophils Relative: 0 %
Eosinophils Absolute: 0 10*3/uL (ref 0.0–0.5)
Eosinophils Relative: 0 %
HCT: 40.1 % (ref 36.0–46.0)
Hemoglobin: 13.2 g/dL (ref 12.0–15.0)
Immature Granulocytes: 1 %
Lymphocytes Relative: 7 %
Lymphs Abs: 0.7 10*3/uL (ref 0.7–4.0)
MCH: 28.7 pg (ref 26.0–34.0)
MCHC: 32.9 g/dL (ref 30.0–36.0)
MCV: 87.2 fL (ref 80.0–100.0)
Monocytes Absolute: 0.5 10*3/uL (ref 0.1–1.0)
Monocytes Relative: 5 %
Neutro Abs: 8.9 10*3/uL — ABNORMAL HIGH (ref 1.7–7.7)
Neutrophils Relative %: 87 %
Platelets: 288 10*3/uL (ref 150–400)
RBC: 4.6 MIL/uL (ref 3.87–5.11)
RDW: 13.6 % (ref 11.5–15.5)
WBC: 10.2 10*3/uL (ref 4.0–10.5)
nRBC: 0 % (ref 0.0–0.2)

## 2020-04-01 LAB — C-REACTIVE PROTEIN: CRP: 0.5 mg/dL (ref <1.0)

## 2020-04-01 LAB — BRAIN NATRIURETIC PEPTIDE: B Natriuretic Peptide: 78.9 pg/mL (ref 0.0–100.0)

## 2020-04-01 LAB — PROCALCITONIN: Procalcitonin: <0.1 ng/mL

## 2020-04-01 MED ORDER — FUROSEMIDE 10 MG/ML IJ SOLN
40.0000 mg | Freq: Once | INTRAMUSCULAR | Status: AC
  Administered 2020-04-01: 40 mg via INTRAVENOUS
  Filled 2020-04-01: qty 4

## 2020-04-01 NOTE — Progress Notes (Signed)
PROGRESS NOTE  Amanda Ross CXK:[id number] DOB: Jul 06, 1945 DOA: 03/24/2020  PCP: [name], MD  Brief History/Interval Summary: 74 y.o. female with history of asthma, HTN, OSA, primary hypothyroidism and left thyroid nodule resented with myalgias, chills, intermittent cough) shortness of breath.  She tested positive for COVID-19 at her PCP office.  She apparently desaturated into the mid 80s and was sent over to the emergency department.  Chest x-ray did not show any significant findings.  CT angiogram did not show PE but did show multifocal patchy airspace opacities right greater than left.    Reason for Visit: Pneumonia due to COVID-19.  Acute respiratory failure with hypoxia.  Consultants: None  Procedures: None  Antibiotics: Anti-infectives (From admission, onward)   Start     Dose/Rate Route Frequency Ordered Stop   03/25/20 1000  remdesivir 100 mg in sodium chloride 0.9 % 100 mL IVPB       "Followed by" Linked Group Details   100 mg 200 mL/hr over 30 Minutes Intravenous Daily 03/24/20 1533 03/28/20 0900   03/24/20 1545  remdesivir 200 mg in sodium chloride 0.9% 250 mL IVPB       "Followed by" Linked Group Details   200 mg 580 mL/hr over 30 Minutes Intravenous Once 03/24/20 1533 03/24/20 1940      Subjective/Interval History:  Patient in bed, appears comfortable, denies any headache, no fever, no chest pain or pressure, she says she is still short of breath but somewhat improved, no abdominal pain. No focal weakness.   Assessment/Plan:  Acute Hypoxic Resp. Failure/Pneumonia due to COVID-19 - she is fully vaccinated and unfortunately has breakthrough disease, initially responded well to IV steroids and Remdesivir however on 03/29/2020 more hypoxic, he was given Actemra on 03/29/2020 early in the morning, currently on HHFL and much better.  As needed Lasix being used as well, repeat IV dose on 04/01/2020, overall still quite tenuous continue to monitor  closely.  Encouraged her to sit up in chair in the daytime use I-S and flutter valve for pulmonary toiletry and prone at night.  SpO2: 93 % O2 Flow Rate (L/min): 40 L/min FiO2 (%): 80 %   Recent Labs  Lab 03/28/20 0438 03/29/20 0408 03/30/20 0515 03/31/20 0612 04/01/20 0317  WBC 9.5 8.9 10.1 9.1 10.2  HGB 12.9 12.8 13.9 13.6 13.2  HCT 38.0 38.6 41.2 39.8 40.1  PLT 231 211 265 314 288  CRP 2.9* 2.5* 1.5* 0.7 0.5  BNP 116.2* 99.5 78.9 94.2 78.9  DDIMER 0.51* 0.62* 0.43 0.44 0.43  PROCALCITON <0.10 <0.10 <0.10 <0.10 <0.10  AST 25 18 20 16 16   ALT 23 23 22 21 21   ALKPHOS 61 57 61 58 56  BILITOT 0.1* 0.4 0.5 0.8 0.8  ALBUMIN 2.6* 2.7* 2.9* 2.8* 2.7*      1 out of 2 staph epidermis positive blood culture.  Contaminant.     Acute kidney injury on chronic kidney disease stage IIIb -   her baseline creatinine is around 1.4, AKI likely due to ATN caused by acute infection and dehydration, improved, continue to monitor  Obstructive sleep apnea - Continue with oxygen.  History of asthma - Steroids and inhalers as above.  Essential hypertension - Continue Toprol and Norvasc combination, added IV hydralazine as needed.    History of overactive bladder - Monitor. Continue oxybutynin.  Mild Thrombocytopenia - Due to viral infection. Resolved.  Morbid obesity - BMI  Of > 39 Follow with PCP for weight loss.  DVT Prophylaxis: Lovenox Code Status: Full code Family Communication: Discussed with patient.  Called her husband [name]   [phone] and updated 03/28/20, 03/29/20, 03/31/20  Disposition Plan: Hopefully return home when improved  Status is: Inpatient  Remains inpatient appropriate because:IV treatments appropriate due to intensity of illness or inability to take PO and Inpatient level of care appropriate due to severity of illness   Dispo: The patient is from: Home              Anticipated d/c is to: Home              Anticipated d/c date is: 2 days               Patient currently is not medically stable to d/c.   Medications:  Scheduled: . amLODipine  10 mg Oral Daily  . vitamin C  500 mg Oral Daily  . aspirin EC  81 mg Oral Daily  . cholecalciferol  2,000 Units Oral Daily  . enoxaparin (LOVENOX) injection  40 mg Subcutaneous Q12H  . furosemide  40 mg Intravenous Once  . hydrALAZINE  25 mg Oral Q8H  . Ipratropium-Albuterol  1 puff Inhalation Q6H  . methylPREDNISolone (SOLU-MEDROL) injection  60 mg Intravenous Q12H  . metoprolol tartrate  25 mg Oral BID  . oxybutynin  10 mg Oral QHS  . pantoprazole  40 mg Oral Daily  . traZODone  50 mg Oral QHS  . zinc sulfate  220 mg Oral Daily   Continuous:  XTG:GYIRSWNIOEVOJ, baclofen, bisacodyl, chlorpheniramine-HYDROcodone, guaiFENesin-dextromethorphan, hydrALAZINE, hydroxypropyl methylcellulose / hypromellose, magnesium citrate, [DISCONTINUED] ondansetron **OR** ondansetron (ZOFRAN) IV, polyethylene glycol, simethicone, sodium chloride, zolpidem   Objective:  Vital Signs  Vitals:   03/31/20 2111 04/01/20 0225 04/01/20 0511 04/01/20 0812  BP: 130/80  (!) 155/98   Pulse: 79 71  98  Resp: 20 19    Temp: 97.9 F (36.6 C)     TempSrc:      SpO2: 93% 94%  93%  Weight:      Height:        Intake/Output Summary (Last 24 hours) at 04/01/2020 0922 Last data filed at 03/31/2020 2253 Gross per 24 hour  Intake 360 ml  Output --  Net 360 ml   Filed Weights   03/24/20 1033  Weight: 102.1 kg    EXAM  Awake Alert, No new F.N deficits, Normal affect [place].AT,PERRAL Supple Neck,No JVD, No cervical lymphadenopathy appriciated.  Symmetrical Chest wall movement, Good air movement bilaterally, few rales RRR,No Gallops, Rubs or new Murmurs, No Parasternal Heave +ve B.Sounds, Abd Soft, No tenderness, No organomegaly appriciated, No rebound - guarding or rigidity. No Cyanosis, Clubbing or edema, No new Rash or bruise    Lab Results:  Data Reviewed: I have personally reviewed following labs and  imaging studies  Recent Labs  Lab 03/28/20 0438 03/29/20 0408 03/30/20 0515 03/31/20 0612 04/01/20 0317  WBC 9.5 8.9 10.1 9.1 10.2  HGB 12.9 12.8 13.9 13.6 13.2  HCT 38.0 38.6 41.2 39.8 40.1  PLT 231 211 265 314 288  MCV 89.0 86.9 87.7 87.1 87.2  MCH 30.2 28.8 29.6 29.8 28.7  MCHC 33.9 33.2 33.7 34.2 32.9  RDW 13.8 13.7 13.7 13.6 13.6  LYMPHSABS 0.6* 0.5* 0.8 0.8 0.7  MONOABS 0.6 0.5 0.7 0.6 0.5  EOSABS 0.0 0.0 0.0 0.0 0.0  BASOSABS 0.0 0.0 0.0 0.0 0.0    Recent Labs  Lab 03/27/20 0228 03/27/20 0229 03/28/20 5009 03/29/20 0408 03/30/20 0515 03/31/20 3818  04/01/20 0317  NA 139   < > 139 141 138 139 138  K 4.2   < > 4.4 4.1 4.3 4.4 4.5  CL 105   < > 105 102 100 99 100  CO2 25   < > 25 28 26 28 26   GLUCOSE 152*   < > 154* 164* 146* 143* 143*  BUN 25*   < > 26* 33* 34* 40* 40*  CREATININE 1.30*   < > 1.43* 1.44* 1.34* 1.23* 1.29*  CALCIUM 9.9   < > 10.5* 10.8* 11.0* 11.0* 11.1*  AST 28   < > 25 18 20 16 16   ALT 21   < > 23 23 22 21 21   ALKPHOS 53   < > 61 57 61 58 56  BILITOT 0.3   < > 0.1* 0.4 0.5 0.8 0.8  ALBUMIN 2.6*   < > 2.6* 2.7* 2.9* 2.8* 2.7*  MG 2.0  --  2.1 2.1  --   --   --   CRP 6.4*   < > 2.9* 2.5* 1.5* 0.7 0.5  DDIMER 0.62*   < > 0.51* 0.62* 0.43 0.44 0.43  PROCALCITON 0.10   < > <0.10 <0.10 <0.10 <0.10 <0.10  BNP  --    < > 116.2* 99.5 78.9 94.2 78.9   < > = values in this interval not displayed.       Recent Results (from the past 240 hour(s))  Novel Coronavirus, NAA ([facility])     Status: Abnormal   Collection Time: 03/24/20 10:13 AM   Specimen: Nasopharyngeal(NP) swabs in vial transport medium  Result Value Ref Range Status   SARS-CoV-2, NAA Detected (A) Not Detected Final    Comment: Patients who have a positive COVID-19 test result may now have treatment options. Treatment options are available for patients with mild to moderate symptoms and for hospitalized patients. Visit our website at [email or web address] for resources and  information. This nucleic acid amplification test was developed and its performance characteristics determined by [employer]. Nucleic acid amplification tests include RT-PCR and TMA. This test has not been FDA cleared or approved. This test has been authorized by FDA under an Emergency Use Authorization (EUA). This test is only authorized for the duration of time the declaration that circumstances exist justifying the authorization of the emergency use of in vitro diagnostic tests for detection of SARS-CoV-2 virus and/or diagnosis of COVID-19 infection under section 564(b)(1) of the Act, 21 U.S.C. 094BSJ-6(G) (1), unless the authorization is terminated or revoked sooner. When diagnostic testing is negativ e, the possibility of a false negative result should be considered in the context of a patient's recent exposures and the presence of clinical signs and symptoms consistent with COVID-19. An individual without symptoms of COVID-19 and who is not shedding SARS-CoV-2 virus would expect to have a negative (not detected) result in this assay.   SARS-COV-2, NAA 2 DAY TAT     Status: None   Collection Time: 03/24/20 10:13 AM  Result Value Ref Range Status   SARS-CoV-2, NAA 2 DAY TAT Performed  Final  Respiratory Panel by RT PCR (Flu A&B, Covid) - Nasopharyngeal Swab     Status: Abnormal   Collection Time: 03/24/20 10:46 AM   Specimen: Nasopharyngeal Swab  Result Value Ref Range Status   SARS Coronavirus 2 by RT PCR POSITIVE (A) NEGATIVE Final    Comment: emailed L. Berdik RN 12:45 03/24/20 ([email or web address]) (NOTE) SARS-CoV-2 target nucleic acids are DETECTED.  SARS-CoV-2 RNA is generally  detectable in upper respiratory specimens  during the acute phase of infection. Positive results are indicative of the presence of the identified virus, but do not rule out bacterial infection or co-infection with other pathogens not detected by the test. Clinical correlation with patient history  and other diagnostic information is necessary to determine patient infection status. The expected result is Negative.  Fact Sheet for Patients:  PinkCheek.be  Fact Sheet for Healthcare Providers: GravelBags.it  This test is not yet approved or cleared by the [place] FDA and  has been authorized for detection and/or diagnosis of SARS-CoV-2 by FDA under an Emergency Use Authorization (EUA).  This EUA will remain in effect (meaning this test can be used) for the duration of  the COVID-19  declaration under Section 564(b)(1) of the Act, 21 U.S.C. section 360bbb-3(b)(1), unless the authorization is terminated or revoked sooner.      Influenza A by PCR NEGATIVE NEGATIVE Final   Influenza B by PCR NEGATIVE NEGATIVE Final    Comment: (NOTE) The Xpert Xpress SARS-CoV-2/FLU/RSV assay is intended as an aid in  the diagnosis of influenza from Nasopharyngeal swab specimens and  should not be used as a sole basis for treatment. Nasal washings and  aspirates are unacceptable for Xpert Xpress SARS-CoV-2/FLU/RSV  testing.  Fact Sheet for Patients: PinkCheek.be  Fact Sheet for Healthcare Providers: GravelBags.it  This test is not yet approved or cleared by the [place] FDA and  has been authorized for detection and/or diagnosis of SARS-CoV-2 by  FDA under an Emergency Use Authorization (EUA). This EUA will remain  in effect (meaning this test can be used) for the duration of the  Covid-19 declaration under Section 564(b)(1) of the Act, 21  U.S.C. section 360bbb-3(b)(1), unless the authorization is  terminated or revoked. Performed at [facility], [place] [address] [place], [place] [address]   Blood Culture (routine x 2)     Status: None   Collection Time: 03/24/20  7:20 PM   Specimen: BLOOD  Result Value Ref Range Status   Specimen Description BLOOD LEFT  ANTECUBITAL  Final   Special Requests AEROBIC BOTTLE ONLY Blood Culture adequate volume  Final   Culture   Final    NO GROWTH 5 DAYS Performed at [facility], [place] [address], [place], [place] [address]    Report Status 03/29/2020 FINAL  Final  Blood Culture (routine x 2)     Status: Abnormal   Collection Time: 03/24/20  7:30 PM   Specimen: BLOOD LEFT WRIST  Result Value Ref Range Status   Specimen Description BLOOD LEFT WRIST  Final   Special Requests AEROBIC BOTTLE ONLY Blood Culture adequate volume  Final   Culture  Setup Time   Final    GRAM POSITIVE COCCI AEROBIC BOTTLE ONLY CRITICAL RESULT CALLED TO, READ BACK BY AND VERIFIED WITH: PHARMD J MILLEN 03/25/20 AT 2024 SK    Culture (A)  Final    STAPHYLOCOCCUS EPIDERMIDIS THE SIGNIFICANCE OF ISOLATING THIS ORGANISM FROM A SINGLE SET OF BLOOD CULTURES WHEN MULTIPLE SETS ARE DRAWN IS UNCERTAIN. PLEASE NOTIFY THE MICROBIOLOGY DEPARTMENT WITHIN ONE WEEK IF SPECIATION AND SENSITIVITIES ARE REQUIRED. Performed at [facility], [place] [address], [place], [place] [address]    Report Status 03/26/2020 FINAL  Final  Blood Culture ID Panel (Reflexed)     Status: Abnormal   Collection Time: 03/24/20  7:30 PM  Result Value Ref Range Status   Enterococcus faecalis NOT DETECTED NOT DETECTED  Final   Enterococcus Faecium NOT DETECTED NOT DETECTED Final   Listeria monocytogenes NOT DETECTED NOT DETECTED Final   Staphylococcus species DETECTED (A) NOT DETECTED Final    Comment: CRITICAL RESULT CALLED TO, READ BACK BY AND VERIFIED WITH: PHARMD J MILLEN 03/25/20 AT 2024 SK    Staphylococcus aureus (BCID) NOT DETECTED NOT DETECTED Final   Staphylococcus epidermidis DETECTED (A) NOT DETECTED Final    Comment: CRITICAL RESULT CALLED TO, READ BACK BY AND VERIFIED WITH: PHARMD J MILLEN 03/25/20 AT 2024 SK    Staphylococcus lugdunensis NOT DETECTED NOT DETECTED Final   Streptococcus species NOT DETECTED NOT DETECTED Final   Streptococcus  agalactiae NOT DETECTED NOT DETECTED Final   Streptococcus pneumoniae NOT DETECTED NOT DETECTED Final   Streptococcus pyogenes NOT DETECTED NOT DETECTED Final   A.calcoaceticus-baumannii NOT DETECTED NOT DETECTED Final   Bacteroides fragilis NOT DETECTED NOT DETECTED Final   Enterobacterales NOT DETECTED NOT DETECTED Final   Enterobacter cloacae complex NOT DETECTED NOT DETECTED Final   Escherichia coli NOT DETECTED NOT DETECTED Final   Klebsiella aerogenes NOT DETECTED NOT DETECTED Final   Klebsiella oxytoca NOT DETECTED NOT DETECTED Final   Klebsiella pneumoniae NOT DETECTED NOT DETECTED Final   Proteus species NOT DETECTED NOT DETECTED Final   Salmonella species NOT DETECTED NOT DETECTED Final   Serratia marcescens NOT DETECTED NOT DETECTED Final   Haemophilus influenzae NOT DETECTED NOT DETECTED Final   Neisseria meningitidis NOT DETECTED NOT DETECTED Final   Pseudomonas aeruginosa NOT DETECTED NOT DETECTED Final   Stenotrophomonas maltophilia NOT DETECTED NOT DETECTED Final   Candida albicans NOT DETECTED NOT DETECTED Final   Candida auris NOT DETECTED NOT DETECTED Final   Candida glabrata NOT DETECTED NOT DETECTED Final   Candida krusei NOT DETECTED NOT DETECTED Final   Candida parapsilosis NOT DETECTED NOT DETECTED Final   Candida tropicalis NOT DETECTED NOT DETECTED Final   Cryptococcus neoformans/gattii NOT DETECTED NOT DETECTED Final   Methicillin resistance mecA/C NOT DETECTED NOT DETECTED Final    Comment: Performed at [facility], [address], [place], [place] [address]  Culture, blood (routine x 2)     Status: None   Collection Time: 03/25/20 10:02 PM   Specimen: BLOOD LEFT ARM  Result Value Ref Range Status   Specimen Description BLOOD LEFT ARM  Final   Special Requests   Final    BOTTLES DRAWN AEROBIC AND ANAEROBIC Blood Culture adequate volume   Culture   Final    NO GROWTH 5 DAYS Performed at [facility], [place] [address], [place], [place]  [address]    Report Status 03/30/2020 FINAL  Final  Culture, blood (routine x 2)     Status: None   Collection Time: 03/25/20 10:03 PM   Specimen: BLOOD LEFT HAND  Result Value Ref Range Status   Specimen Description BLOOD LEFT HAND  Final   Special Requests   Final    BOTTLES DRAWN AEROBIC AND ANAEROBIC Blood Culture adequate volume   Culture   Final    NO GROWTH 5 DAYS Performed at [facility], [place] [address], [place], [place] [address]    Report Status 03/30/2020 FINAL  Final      Radiology Studies: DG Chest Port 1 View  Result Date: 03/31/2020 CLINICAL DATA:  Shortness of breath. EXAM: PORTABLE CHEST 1 VIEW COMPARISON:  03/28/2020 FINDINGS: Mild cardiomegaly stable. Aortic atherosclerotic calcification noted. Pulmonary infiltrate is again seen at the right base, with mild worsening  since previous study. There is also mild airspace disease in the peripheral left mid lung which is unchanged in appearance. No definite pleural effusion or pneumothorax. IMPRESSION: Mild worsening of right basilar pulmonary infiltrate. Stable mild airspace disease in peripheral left mid lung. Electronically Signed   By: Marlaine Hind M.D.   On: 03/31/2020 08:07     LOS: 8 days   [facility] Pager on [email or web address]  04/01/2020, 9:22 AM

## 2020-04-01 NOTE — Progress Notes (Signed)
Physical Therapy Treatment Patient Details Name: Amanda Ross MRN: [id number] DOB: 04-Feb-1946 Today's Date: 04/01/2020    History of Present Illness 74 y.o. female with history of asthma, HTN, OSA, primary hypothyroidism and left thyroid nodule resented with myalgias, chills, intermittent cough) shortness of breath.  She tested positive for COVID-19 at her PCP office.  She apparently desaturated into the mid 80s and was sent over to the emergency department.  Chest x-ray did not show any significant findings.  CT angiogram did not show PE but did show multifocal patchy airspace opacities right greater than left.    PT Comments    Patient received in chair, very pleasant and willing to participate in session today. Still on HHFNC but not wearing NRB upon entry/able to participate in session with HHFNC only while staying between 84-94% SpO2, HR WNL. Worked on standing functional exercise sets including heel raises and standing marches, lateral stepping, and forwards/backwards stepping with MinA for dynamic balance today. Patient fatigued at EOS. Left up in recliner with NT present and attending, all needs otherwise met.     Follow Up Recommendations  Home health PT;Supervision/Assistance - 24 hour     Equipment Recommendations  None recommended by PT    Recommendations for Other Services       Precautions / Restrictions Precautions Precautions: Fall Precaution Comments: watch O2 Restrictions Weight Bearing Restrictions: No    Mobility  Bed Mobility               General bed mobility comments: sitting in the chair on arrival  Transfers Overall transfer level: Modified independent Equipment used: None Transfers: Sit to/from Stand Sit to Stand: Modified independent (Device/Increase time)         General transfer comment: No assist needed for sit to stand transfers, supervision for safety with mobility   Ambulation/Gait             General Gait Details: not  able today due to on HHFNC, but did standing exercise intervals with good tolerance   Stairs             Wheelchair Mobility    Modified Rankin (Stroke Patients Only)       Balance Overall balance assessment: Needs assistance   Sitting balance-Leahy Scale: Good     Standing balance support: Bilateral upper extremity supported Standing balance-Leahy Scale: Fair Standing balance comment: MinA dynamically without BUE support                            Cognition Arousal/Alertness: Awake/alert Behavior During Therapy: WFL for tasks assessed/performed Overall Cognitive Status: Within Functional Limits for tasks assessed                                        Exercises      General Comments General comments (skin integrity, edema, etc.): with standing exercise intervals, HR WNL and SpO2 on 35HHFNC fluctuated from 84-94%, did not need to wear NRB during session      Pertinent Vitals/Pain Pain Assessment: No/denies pain Faces Pain Scale: No hurt Pain Intervention(s): Limited activity within patient's tolerance;Monitored during session    Home Living                      Prior Function            PT Goals (current  goals can now be found in the care plan section) Acute Rehab PT Goals Patient Stated Goal: go home today PT Goal Formulation: With patient Time For Goal Achievement: 04/10/20 Potential to Achieve Goals: Good Progress towards PT goals: Progressing toward goals    Frequency    Min 3X/week      PT Plan Current plan remains appropriate    Co-evaluation              AM-PAC PT "6 Clicks" Mobility   Outcome Measure  Help needed turning from your back to your side while in a flat bed without using bedrails?: A Little Help needed moving from lying on your back to sitting on the side of a flat bed without using bedrails?: A Little Help needed moving to and from a bed to a chair (including a wheelchair)?:  None Help needed standing up from a chair using your arms (e.g., wheelchair or bedside chair)?: None Help needed to walk in hospital room?: None Help needed climbing 3-5 steps with a railing? : None 6 Click Score: 22    End of Session Equipment Utilized During Treatment: Oxygen Activity Tolerance: Patient tolerated treatment well Patient left: in chair;with call bell/phone within reach (NT present and attending) Nurse Communication: Mobility status PT Visit Diagnosis: Other abnormalities of gait and mobility (R26.89);Muscle weakness (generalized) (M62.81)     Time: 3151-7616 PT Time Calculation (min) (ACUTE ONLY): 25 min  Charges:  $Therapeutic Exercise: 23-37 mins                     Windell Norfolk, DPT, PN1   Supplemental Physical Therapist [place]    Pager [phone] Acute Rehab Office [phone]

## 2020-04-02 DIAGNOSIS — J9601 Acute respiratory failure with hypoxia: Secondary | ICD-10-CM | POA: Diagnosis not present

## 2020-04-02 DIAGNOSIS — U071 COVID-19: Secondary | ICD-10-CM | POA: Diagnosis not present

## 2020-04-02 LAB — CBC WITH DIFFERENTIAL/PLATELET
Abs Immature Granulocytes: 0.16 10*3/uL — ABNORMAL HIGH (ref 0.00–0.07)
Basophils Absolute: 0 10*3/uL (ref 0.0–0.1)
Basophils Relative: 0 %
Eosinophils Absolute: 0 10*3/uL (ref 0.0–0.5)
Eosinophils Relative: 0 %
HCT: 41.1 % (ref 36.0–46.0)
Hemoglobin: 13.9 g/dL (ref 12.0–15.0)
Immature Granulocytes: 1 %
Lymphocytes Relative: 6 %
Lymphs Abs: 0.7 10*3/uL (ref 0.7–4.0)
MCH: 29.4 pg (ref 26.0–34.0)
MCHC: 33.8 g/dL (ref 30.0–36.0)
MCV: 86.9 fL (ref 80.0–100.0)
Monocytes Absolute: 0.5 10*3/uL (ref 0.1–1.0)
Monocytes Relative: 4 %
Neutro Abs: 10.9 10*3/uL — ABNORMAL HIGH (ref 1.7–7.7)
Neutrophils Relative %: 89 %
Platelets: 349 10*3/uL (ref 150–400)
RBC: 4.73 MIL/uL (ref 3.87–5.11)
RDW: 13.8 % (ref 11.5–15.5)
WBC: 12.3 10*3/uL — ABNORMAL HIGH (ref 4.0–10.5)
nRBC: 0 % (ref 0.0–0.2)

## 2020-04-02 LAB — PROCALCITONIN: Procalcitonin: <0.1 ng/mL

## 2020-04-02 LAB — COMPREHENSIVE METABOLIC PANEL
ALT: 21 U/L (ref 0–44)
AST: 15 U/L (ref 15–41)
Albumin: 2.8 g/dL — ABNORMAL LOW (ref 3.5–5.0)
Alkaline Phosphatase: 54 U/L (ref 38–126)
Anion gap: 12 (ref 5–15)
BUN: 42 mg/dL — ABNORMAL HIGH (ref 8–23)
CO2: 27 mmol/L (ref 22–32)
Calcium: 11.2 mg/dL — ABNORMAL HIGH (ref 8.9–10.3)
Chloride: 99 mmol/L (ref 98–111)
Creatinine, Ser: 1.59 mg/dL — ABNORMAL HIGH (ref 0.44–1.00)
GFR, Estimated: 32 mL/min — ABNORMAL LOW (ref >60)
Glucose, Bld: 145 mg/dL — ABNORMAL HIGH (ref 70–99)
Potassium: 4.6 mmol/L (ref 3.5–5.1)
Sodium: 138 mmol/L (ref 135–145)
Total Bilirubin: 0.6 mg/dL (ref 0.3–1.2)
Total Protein: 6.2 g/dL — ABNORMAL LOW (ref 6.5–8.1)

## 2020-04-02 LAB — D-DIMER, QUANTITATIVE: D-Dimer, Quant: <0.27 ug/mL-FEU (ref 0.00–0.50)

## 2020-04-02 LAB — C-REACTIVE PROTEIN: CRP: 0.6 mg/dL (ref <1.0)

## 2020-04-02 LAB — BRAIN NATRIURETIC PEPTIDE: B Natriuretic Peptide: 90.8 pg/mL (ref 0.0–100.0)

## 2020-04-02 NOTE — Plan of Care (Signed)

## 2020-04-02 NOTE — Progress Notes (Addendum)
PROGRESS NOTE  Amanda Ross RKY:[id number] DOB: Nov 13, 1945 DOA: 03/24/2020  PCP: [name], MD  Brief History/Interval Summary: 74 y.o. female with history of asthma, HTN, OSA, primary hypothyroidism and left thyroid nodule resented with myalgias, chills, intermittent cough) shortness of breath.  She tested positive for COVID-19 at her PCP office.  She apparently desaturated into the mid 80s and was sent over to the emergency department.  Chest x-ray did not show any significant findings.  CT angiogram did not show PE but did show multifocal patchy airspace opacities right greater than left.   Reason for Visit: Pneumonia due to COVID-19.  Acute respiratory failure with hypoxia.  Consultants: None  Procedures:   CTA - No PE  Leg [place] - No DVT  TTE.  Antibiotics: Anti-infectives (From admission, onward)   Start     Dose/Rate Route Frequency Ordered Stop   03/25/20 1000  remdesivir 100 mg in sodium chloride 0.9 % 100 mL IVPB       "Followed by" Linked Group Details   100 mg 200 mL/hr over 30 Minutes Intravenous Daily 03/24/20 1533 03/28/20 0900   03/24/20 1545  remdesivir 200 mg in sodium chloride 0.9% 250 mL IVPB       "Followed by" Linked Group Details   200 mg 580 mL/hr over 30 Minutes Intravenous Once 03/24/20 1533 03/24/20 1940      Subjective/Interval History:  Patient in bed, appears comfortable, denies any headache, no fever, no chest pain or pressure, +ve shortness of breath , no abdominal pain. No focal weakness.    Assessment/Plan:  Acute Hypoxic Resp. Failure/Pneumonia due to COVID-19 - she is fully vaccinated and unfortunately has breakthrough disease, initially responded well to IV steroids and Remdesivir however on 03/29/2020 more hypoxic, he was given Actemra on 03/29/2020 early in the morning, currently on HHFL and much better.  As needed Lasix being used as well, repeat IV dose on 04/03/2020, overall still quite tenuous continue to monitor closely.  Check TTE.  Encouraged her to sit up in chair in the daytime use I-S and flutter valve for pulmonary toiletry and prone at night.  SpO2: 91 % O2 Flow Rate (L/min): 40 L/min FiO2 (%): 80 %   Recent Labs  Lab 03/30/20 0515 03/31/20 0612 04/01/20 0317 04/02/20 0328 04/03/20 0248  WBC 10.1 9.1 10.2 12.3* 13.6*  HGB 13.9 13.6 13.2 13.9 13.7  HCT 41.2 39.8 40.1 41.1 40.5  PLT 265 314 288 349 317  CRP 1.5* 0.7 0.5 0.6 <0.5  BNP 78.9 94.2 78.9 90.8 76.5  DDIMER 0.43 0.44 0.43 <0.27 0.39  PROCALCITON <0.10 <0.10 <0.10 <0.10 <0.10  AST 20 16 16 15 17   ALT 22 21 21 21 21   ALKPHOS 61 58 56 54 49  BILITOT 0.5 0.8 0.8 0.6 0.7  ALBUMIN 2.9* 2.8* 2.7* 2.8* 2.8*      1 out of 2 staph epidermis positive blood culture.  Contaminant.     Acute kidney injury on chronic kidney disease stage IIIb -   her baseline creatinine is around 1.4, AKI likely due to ATN caused by acute infection and dehydration, improved, continue to monitor  Obstructive sleep apnea - Continue with oxygen.  History of asthma - Steroids and inhalers as above.  Essential hypertension - Continue Toprol and Norvasc combination, added IV hydralazine as needed.    History of overactive bladder - Monitor. Continue oxybutynin.  Mild Thrombocytopenia - Due to viral infection. Resolved.  Morbid obesity - BMI  Of > 39  Follow with PCP for weight loss.     DVT Prophylaxis: Lovenox Code Status: Full code Family Communication: Discussed with patient.  Called her husband [name]  # [phone] and updated 03/28/20, 03/29/20, 03/31/20, 04/03/20 explained tenuous condition.  Disposition Plan: Hopefully return home when improved  Status is: Inpatient  Remains inpatient appropriate because:IV treatments appropriate due to intensity of illness or inability to take PO and Inpatient level of care appropriate due to severity of illness   Dispo: The patient is from: Home              Anticipated d/c is to: Home               Anticipated d/c date is: 2 days              Patient currently is not medically stable to d/c.   Medications:  Scheduled: . vitamin C  500 mg Oral Daily  . aspirin EC  81 mg Oral Daily  . cholecalciferol  2,000 Units Oral Daily  . enoxaparin (LOVENOX) injection  40 mg Subcutaneous Q12H  . furosemide  60 mg Intravenous Once  . Ipratropium-Albuterol  1 puff Inhalation Q6H  . methylPREDNISolone (SOLU-MEDROL) injection  60 mg Intravenous Q12H  . [START ON 04/04/2020] metoprolol tartrate  12.5 mg Oral BID  . oxybutynin  10 mg Oral QHS  . pantoprazole  40 mg Oral Daily  . traZODone  50 mg Oral QHS  . zinc sulfate  220 mg Oral Daily   Continuous:  BJY:NWGNFAOZHYQMV, baclofen, bisacodyl, chlorpheniramine-HYDROcodone, guaiFENesin-dextromethorphan, hydrALAZINE, hydroxypropyl methylcellulose / hypromellose, magnesium citrate, [DISCONTINUED] ondansetron **OR** ondansetron (ZOFRAN) IV, polyethylene glycol, simethicone, sodium chloride, zolpidem   Objective:  Vital Signs  Vitals:   04/03/20 0200 04/03/20 0529 04/03/20 0724 04/03/20 0822  BP:  113/72 105/74   Pulse: 75 77 78   Resp: 17 20 19    Temp:  98 F (36.7 C) 98.2 F (36.8 C)   TempSrc:      SpO2: 94% 91% (!) 89% 91%  Weight:      Height:       No intake or output data in the 24 hours ending 04/03/20 0918 Filed Weights   03/24/20 1033  Weight: 102.1 kg    EXAM  Awake Alert, No new F.N deficits, Normal affect [place].AT,PERRAL Supple Neck,No JVD, No cervical lymphadenopathy appriciated.  Symmetrical Chest wall movement, Good air movement bilaterally, CTAB RRR,No Gallops, Rubs or new Murmurs, No Parasternal Heave +ve B.Sounds, Abd Soft, No tenderness, No organomegaly appriciated, No rebound - guarding or rigidity. No Cyanosis, Clubbing or edema, No new Rash or bruise   Lab Results:  Data Reviewed: I have personally reviewed following labs and imaging studies  Recent Labs  Lab 03/30/20 0515 03/31/20 0612 04/01/20 0317  04/02/20 0328 04/03/20 0248  WBC 10.1 9.1 10.2 12.3* 13.6*  HGB 13.9 13.6 13.2 13.9 13.7  HCT 41.2 39.8 40.1 41.1 40.5  PLT 265 314 288 349 317  MCV 87.7 87.1 87.2 86.9 86.2  MCH 29.6 29.8 28.7 29.4 29.1  MCHC 33.7 34.2 32.9 33.8 33.8  RDW 13.7 13.6 13.6 13.8 13.5  LYMPHSABS 0.8 0.8 0.7 0.7 0.6*  MONOABS 0.7 0.6 0.5 0.5 0.3  EOSABS 0.0 0.0 0.0 0.0 0.0  BASOSABS 0.0 0.0 0.0 0.0 0.0    Recent Labs  Lab 03/28/20 0438 03/28/20 0438 03/29/20 0408 03/29/20 0408 03/30/20 0515 03/31/20 0612 04/01/20 0317 04/02/20 0328 04/03/20 0248  NA 139   < > 141   < >  138 139 138 138 135  K 4.4   < > 4.1   < > 4.3 4.4 4.5 4.6 4.4  CL 105   < > 102   < > 100 99 100 99 98  CO2 25   < > 28   < > 26 28 26 27 28   GLUCOSE 154*   < > 164*   < > 146* 143* 143* 145* 128*  BUN 26*   < > 33*   < > 34* 40* 40* 42* 44*  CREATININE 1.43*   < > 1.44*   < > 1.34* 1.23* 1.29* 1.59* 1.44*  CALCIUM 10.5*   < > 10.8*   < > 11.0* 11.0* 11.1* 11.2* 11.3*  AST 25   < > 18   < > 20 16 16 15 17   ALT 23   < > 23   < > 22 21 21 21 21   ALKPHOS 61   < > 57   < > 61 58 56 54 49  BILITOT 0.1*   < > 0.4   < > 0.5 0.8 0.8 0.6 0.7  ALBUMIN 2.6*   < > 2.7*   < > 2.9* 2.8* 2.7* 2.8* 2.8*  MG 2.1  --  2.1  --   --   --   --   --   --   CRP 2.9*   < > 2.5*   < > 1.5* 0.7 0.5 0.6 <0.5  DDIMER 0.51*   < > 0.62*   < > 0.43 0.44 0.43 <0.27 0.39  PROCALCITON <0.10   < > <0.10   < > <0.10 <0.10 <0.10 <0.10 <0.10  BNP 116.2*   < > 99.5   < > 78.9 94.2 78.9 90.8 76.5   < > = values in this interval not displayed.       Recent Results (from the past 240 hour(s))  Novel Coronavirus, NAA ([facility])     Status: Abnormal   Collection Time: 03/24/20 10:13 AM   Specimen: Nasopharyngeal(NP) swabs in vial transport medium  Result Value Ref Range Status   SARS-CoV-2, NAA Detected (A) Not Detected Final    Comment: Patients who have a positive COVID-19 test result may now have treatment options. Treatment options are available for  patients with mild to moderate symptoms and for hospitalized patients. Visit our website at [email or web address] for resources and information. This nucleic acid amplification test was developed and its performance characteristics determined by [employer]. Nucleic acid amplification tests include RT-PCR and TMA. This test has not been FDA cleared or approved. This test has been authorized by FDA under an Emergency Use Authorization (EUA). This test is only authorized for the duration of time the declaration that circumstances exist justifying the authorization of the emergency use of in vitro diagnostic tests for detection of SARS-CoV-2 virus and/or diagnosis of COVID-19 infection under section 564(b)(1) of the Act, 21 U.S.C. 128NOM-7(E) (1), unless the authorization is terminated or revoked sooner. When diagnostic testing is negativ e, the possibility of a false negative result should be considered in the context of a patient's recent exposures and the presence of clinical signs and symptoms consistent with COVID-19. An individual without symptoms of COVID-19 and who is not shedding SARS-CoV-2 virus would expect to have a negative (not detected) result in this assay.   SARS-COV-2, NAA 2 DAY TAT     Status: None   Collection Time: 03/24/20 10:13 AM  Result Value Ref Range Status   SARS-CoV-2, NAA  2 DAY TAT Performed  Final  Respiratory Panel by RT PCR (Flu A&B, Covid) - Nasopharyngeal Swab     Status: Abnormal   Collection Time: 03/24/20 10:46 AM   Specimen: Nasopharyngeal Swab  Result Value Ref Range Status   SARS Coronavirus 2 by RT PCR POSITIVE (A) NEGATIVE Final    Comment: emailed L. Berdik RN 12:45 03/24/20 ([email or web address]) (NOTE) SARS-CoV-2 target nucleic acids are DETECTED.  SARS-CoV-2 RNA is generally detectable in upper respiratory specimens  during the acute phase of infection. Positive results are indicative of the presence of the identified virus, but do  not rule out bacterial infection or co-infection with other pathogens not detected by the test. Clinical correlation with patient history and other diagnostic information is necessary to determine patient infection status. The expected result is Negative.  Fact Sheet for Patients:  PinkCheek.be  Fact Sheet for Healthcare Providers: GravelBags.it  This test is not yet approved or cleared by the [place] FDA and  has been authorized for detection and/or diagnosis of SARS-CoV-2 by FDA under an Emergency Use Authorization (EUA).  This EUA will remain in effect (meaning this test can be used) for the duration of  the COVID-19  declaration under Section 564(b)(1) of the Act, 21 U.S.C. section 360bbb-3(b)(1), unless the authorization is terminated or revoked sooner.      Influenza A by PCR NEGATIVE NEGATIVE Final   Influenza B by PCR NEGATIVE NEGATIVE Final    Comment: (NOTE) The Xpert Xpress SARS-CoV-2/FLU/RSV assay is intended as an aid in  the diagnosis of influenza from Nasopharyngeal swab specimens and  should not be used as a sole basis for treatment. Nasal washings and  aspirates are unacceptable for Xpert Xpress SARS-CoV-2/FLU/RSV  testing.  Fact Sheet for Patients: PinkCheek.be  Fact Sheet for Healthcare Providers: GravelBags.it  This test is not yet approved or cleared by the [place] FDA and  has been authorized for detection and/or diagnosis of SARS-CoV-2 by  FDA under an Emergency Use Authorization (EUA). This EUA will remain  in effect (meaning this test can be used) for the duration of the  Covid-19 declaration under Section 564(b)(1) of the Act, 21  U.S.C. section 360bbb-3(b)(1), unless the authorization is  terminated or revoked. Performed at [facility], [place] [address], [place], [place] [address]   Blood Culture (routine x 2)      Status: None   Collection Time: 03/24/20  7:20 PM   Specimen: BLOOD  Result Value Ref Range Status   Specimen Description BLOOD LEFT ANTECUBITAL  Final   Special Requests AEROBIC BOTTLE ONLY Blood Culture adequate volume  Final   Culture   Final    NO GROWTH 5 DAYS Performed at [facility], [place] [address], [place], [place] [address]    Report Status 03/29/2020 FINAL  Final  Blood Culture (routine x 2)     Status: Abnormal   Collection Time: 03/24/20  7:30 PM   Specimen: BLOOD LEFT WRIST  Result Value Ref Range Status   Specimen Description BLOOD LEFT WRIST  Final   Special Requests AEROBIC BOTTLE ONLY Blood Culture adequate volume  Final   Culture  Setup Time   Final    GRAM POSITIVE COCCI AEROBIC BOTTLE ONLY CRITICAL RESULT CALLED TO, READ BACK BY AND VERIFIED WITH: PHARMD J MILLEN 03/25/20 AT 2024 SK    Culture (A)  Final    STAPHYLOCOCCUS EPIDERMIDIS THE SIGNIFICANCE OF ISOLATING THIS ORGANISM FROM A SINGLE SET OF BLOOD CULTURES  WHEN MULTIPLE SETS ARE DRAWN IS UNCERTAIN. PLEASE NOTIFY THE MICROBIOLOGY DEPARTMENT WITHIN ONE WEEK IF SPECIATION AND SENSITIVITIES ARE REQUIRED. Performed at [facility], [place] [address], [place], [place] [address]    Report Status 03/26/2020 FINAL  Final  Blood Culture ID Panel (Reflexed)     Status: Abnormal   Collection Time: 03/24/20  7:30 PM  Result Value Ref Range Status   Enterococcus faecalis NOT DETECTED NOT DETECTED Final   Enterococcus Faecium NOT DETECTED NOT DETECTED Final   Listeria monocytogenes NOT DETECTED NOT DETECTED Final   Staphylococcus species DETECTED (A) NOT DETECTED Final    Comment: CRITICAL RESULT CALLED TO, READ BACK BY AND VERIFIED WITH: PHARMD J MILLEN 03/25/20 AT 2024 SK    Staphylococcus aureus (BCID) NOT DETECTED NOT DETECTED Final   Staphylococcus epidermidis DETECTED (A) NOT DETECTED Final    Comment: CRITICAL RESULT CALLED TO, READ BACK BY AND VERIFIED WITH: PHARMD J MILLEN 03/25/20 AT 2024 SK      Staphylococcus lugdunensis NOT DETECTED NOT DETECTED Final   Streptococcus species NOT DETECTED NOT DETECTED Final   Streptococcus agalactiae NOT DETECTED NOT DETECTED Final   Streptococcus pneumoniae NOT DETECTED NOT DETECTED Final   Streptococcus pyogenes NOT DETECTED NOT DETECTED Final   A.calcoaceticus-baumannii NOT DETECTED NOT DETECTED Final   Bacteroides fragilis NOT DETECTED NOT DETECTED Final   Enterobacterales NOT DETECTED NOT DETECTED Final   Enterobacter cloacae complex NOT DETECTED NOT DETECTED Final   Escherichia coli NOT DETECTED NOT DETECTED Final   Klebsiella aerogenes NOT DETECTED NOT DETECTED Final   Klebsiella oxytoca NOT DETECTED NOT DETECTED Final   Klebsiella pneumoniae NOT DETECTED NOT DETECTED Final   Proteus species NOT DETECTED NOT DETECTED Final   Salmonella species NOT DETECTED NOT DETECTED Final   Serratia marcescens NOT DETECTED NOT DETECTED Final   Haemophilus influenzae NOT DETECTED NOT DETECTED Final   Neisseria meningitidis NOT DETECTED NOT DETECTED Final   Pseudomonas aeruginosa NOT DETECTED NOT DETECTED Final   Stenotrophomonas maltophilia NOT DETECTED NOT DETECTED Final   Candida albicans NOT DETECTED NOT DETECTED Final   Candida auris NOT DETECTED NOT DETECTED Final   Candida glabrata NOT DETECTED NOT DETECTED Final   Candida krusei NOT DETECTED NOT DETECTED Final   Candida parapsilosis NOT DETECTED NOT DETECTED Final   Candida tropicalis NOT DETECTED NOT DETECTED Final   Cryptococcus neoformans/gattii NOT DETECTED NOT DETECTED Final   Methicillin resistance mecA/C NOT DETECTED NOT DETECTED Final    Comment: Performed at [facility], [address], [place], [place] [address]  Culture, blood (routine x 2)     Status: None   Collection Time: 03/25/20 10:02 PM   Specimen: BLOOD LEFT ARM  Result Value Ref Range Status   Specimen Description BLOOD LEFT ARM  Final   Special Requests   Final    BOTTLES DRAWN AEROBIC AND ANAEROBIC Blood  Culture adequate volume   Culture   Final    NO GROWTH 5 DAYS Performed at [facility], [place] [address], [place], [place] [address]    Report Status 03/30/2020 FINAL  Final  Culture, blood (routine x 2)     Status: None   Collection Time: 03/25/20 10:03 PM   Specimen: BLOOD LEFT HAND  Result Value Ref Range Status   Specimen Description BLOOD LEFT HAND  Final   Special Requests   Final    BOTTLES DRAWN AEROBIC AND ANAEROBIC Blood Culture adequate volume   Culture   Final  NO GROWTH 5 DAYS Performed at [facility], [place] [address] [place], [place] [address]    Report Status 03/30/2020 FINAL  Final      Radiology Studies: DG Chest Port 1 View  Result Date: 04/03/2020 CLINICAL DATA:  Shortness of breath. EXAM: PORTABLE CHEST 1 VIEW COMPARISON:  03/31/2020 chest radiograph and prior. FINDINGS: Bilateral mid to lower lung patchy opacities are more conspicuous than prior exam. No pneumothorax or pleural effusion. Mild cardiomegaly and aortic arch atherosclerotic calcifications. Osseous structures are unchanged. IMPRESSION: Slightly increased conspicuity of bilateral pulmonary opacities concerning for multifocal pneumonia. Electronically Signed   By: Primitivo Gauze M.D.   On: 04/03/2020 08:24     LOS: 10 days   [facility] Pager on [email or web address]  04/03/2020, 9:18 AM

## 2020-04-03 ENCOUNTER — Inpatient Hospital Stay (HOSPITAL_COMMUNITY): Payer: Medicare Other

## 2020-04-03 LAB — CBC WITH DIFFERENTIAL/PLATELET
Abs Immature Granulocytes: 0.17 10*3/uL — ABNORMAL HIGH (ref 0.00–0.07)
Basophils Absolute: 0 10*3/uL (ref 0.0–0.1)
Basophils Relative: 0 %
Eosinophils Absolute: 0 10*3/uL (ref 0.0–0.5)
Eosinophils Relative: 0 %
HCT: 40.5 % (ref 36.0–46.0)
Hemoglobin: 13.7 g/dL (ref 12.0–15.0)
Immature Granulocytes: 1 %
Lymphocytes Relative: 5 %
Lymphs Abs: 0.6 10*3/uL — ABNORMAL LOW (ref 0.7–4.0)
MCH: 29.1 pg (ref 26.0–34.0)
MCHC: 33.8 g/dL (ref 30.0–36.0)
MCV: 86.2 fL (ref 80.0–100.0)
Monocytes Absolute: 0.3 10*3/uL (ref 0.1–1.0)
Monocytes Relative: 2 %
Neutro Abs: 12.5 10*3/uL — ABNORMAL HIGH (ref 1.7–7.7)
Neutrophils Relative %: 92 %
Platelets: 317 10*3/uL (ref 150–400)
RBC: 4.7 MIL/uL (ref 3.87–5.11)
RDW: 13.5 % (ref 11.5–15.5)
WBC: 13.6 10*3/uL — ABNORMAL HIGH (ref 4.0–10.5)
nRBC: 0 % (ref 0.0–0.2)

## 2020-04-03 LAB — COMPREHENSIVE METABOLIC PANEL
ALT: 21 U/L (ref 0–44)
AST: 17 U/L (ref 15–41)
Albumin: 2.8 g/dL — ABNORMAL LOW (ref 3.5–5.0)
Alkaline Phosphatase: 49 U/L (ref 38–126)
Anion gap: 9 (ref 5–15)
BUN: 44 mg/dL — ABNORMAL HIGH (ref 8–23)
CO2: 28 mmol/L (ref 22–32)
Calcium: 11.3 mg/dL — ABNORMAL HIGH (ref 8.9–10.3)
Chloride: 98 mmol/L (ref 98–111)
Creatinine, Ser: 1.44 mg/dL — ABNORMAL HIGH (ref 0.44–1.00)
GFR, Estimated: 36 mL/min — ABNORMAL LOW (ref >60)
Glucose, Bld: 128 mg/dL — ABNORMAL HIGH (ref 70–99)
Potassium: 4.4 mmol/L (ref 3.5–5.1)
Sodium: 135 mmol/L (ref 135–145)
Total Bilirubin: 0.7 mg/dL (ref 0.3–1.2)
Total Protein: 5.5 g/dL — ABNORMAL LOW (ref 6.5–8.1)

## 2020-04-03 LAB — BRAIN NATRIURETIC PEPTIDE: B Natriuretic Peptide: 76.5 pg/mL (ref 0.0–100.0)

## 2020-04-03 LAB — C-REACTIVE PROTEIN: CRP: <0.5 mg/dL (ref <1.0)

## 2020-04-03 LAB — PROCALCITONIN: Procalcitonin: <0.1 ng/mL

## 2020-04-03 LAB — D-DIMER, QUANTITATIVE: D-Dimer, Quant: 0.39 ug/mL-FEU (ref 0.00–0.50)

## 2020-04-03 MED ORDER — FUROSEMIDE 10 MG/ML IJ SOLN
60.0000 mg | Freq: Once | INTRAMUSCULAR | Status: AC
  Administered 2020-04-03: 60 mg via INTRAVENOUS
  Filled 2020-04-03: qty 6

## 2020-04-03 MED ORDER — FUROSEMIDE 10 MG/ML IJ SOLN: 40.0000 mg | Freq: Once | INTRAMUSCULAR | Status: DC

## 2020-04-03 MED ORDER — METOPROLOL TARTRATE 12.5 MG HALF TABLET
12.5000 mg | ORAL_TABLET | Freq: Two times a day (BID) | ORAL | Status: DC
  Administered 2020-04-04 – 2020-04-16 (×22): 12.5 mg via ORAL
  Filled 2020-04-03 (×24): qty 1

## 2020-04-03 NOTE — Plan of Care (Signed)
  Problem: Clinical Measurements: Goal: Will remain free from infection Outcome: Progressing   Problem: Clinical Measurements: Goal: Respiratory complications will improve Outcome: Progressing   Problem: Clinical Measurements: Goal: Cardiovascular complication will be avoided Outcome: Progressing   Problem: Activity: Goal: Risk for activity intolerance will decrease Outcome: Progressing   Problem: Nutrition: Goal: Adequate nutrition will be maintained Outcome: Progressing   Problem: Coping: Goal: Level of anxiety will decrease Outcome: Progressing   Problem: Elimination: Goal: Will not experience complications related to bowel motility Outcome: Progressing   Problem: Elimination: Goal: Will not experience complications related to urinary retention Outcome: Progressing   Problem: Pain Managment: Goal: General experience of comfort will improve Outcome: Progressing   Problem: Safety: Goal: Ability to remain free from injury will improve Outcome: Progressing   Problem: Skin Integrity: Goal: Risk for impaired skin integrity will decrease Outcome: Progressing

## 2020-04-04 ENCOUNTER — Telehealth: Payer: Medicare Other

## 2020-04-04 DIAGNOSIS — U071 COVID-19: Secondary | ICD-10-CM | POA: Diagnosis not present

## 2020-04-04 DIAGNOSIS — J9601 Acute respiratory failure with hypoxia: Secondary | ICD-10-CM | POA: Diagnosis not present

## 2020-04-04 LAB — CBC WITH DIFFERENTIAL/PLATELET
Abs Immature Granulocytes: 0.24 10*3/uL — ABNORMAL HIGH (ref 0.00–0.07)
Basophils Absolute: 0 10*3/uL (ref 0.0–0.1)
Basophils Relative: 0 %
Eosinophils Absolute: 0 10*3/uL (ref 0.0–0.5)
Eosinophils Relative: 0 %
HCT: 44.2 % (ref 36.0–46.0)
Hemoglobin: 14.9 g/dL (ref 12.0–15.0)
Immature Granulocytes: 1 %
Lymphocytes Relative: 3 %
Lymphs Abs: 0.6 10*3/uL — ABNORMAL LOW (ref 0.7–4.0)
MCH: 29.2 pg (ref 26.0–34.0)
MCHC: 33.7 g/dL (ref 30.0–36.0)
MCV: 86.5 fL (ref 80.0–100.0)
Monocytes Absolute: 0.5 10*3/uL (ref 0.1–1.0)
Monocytes Relative: 3 %
Neutro Abs: 15.7 10*3/uL — ABNORMAL HIGH (ref 1.7–7.7)
Neutrophils Relative %: 93 %
Platelets: 320 10*3/uL (ref 150–400)
RBC: 5.11 MIL/uL (ref 3.87–5.11)
RDW: 13.6 % (ref 11.5–15.5)
WBC: 17 10*3/uL — ABNORMAL HIGH (ref 4.0–10.5)
nRBC: 0 % (ref 0.0–0.2)

## 2020-04-04 LAB — COMPREHENSIVE METABOLIC PANEL
ALT: 22 U/L (ref 0–44)
AST: 17 U/L (ref 15–41)
Albumin: 3 g/dL — ABNORMAL LOW (ref 3.5–5.0)
Alkaline Phosphatase: 60 U/L (ref 38–126)
Anion gap: 11 (ref 5–15)
BUN: 48 mg/dL — ABNORMAL HIGH (ref 8–23)
CO2: 28 mmol/L (ref 22–32)
Calcium: 11.4 mg/dL — ABNORMAL HIGH (ref 8.9–10.3)
Chloride: 100 mmol/L (ref 98–111)
Creatinine, Ser: 1.87 mg/dL — ABNORMAL HIGH (ref 0.44–1.00)
GFR, Estimated: 26 mL/min — ABNORMAL LOW (ref >60)
Glucose, Bld: 177 mg/dL — ABNORMAL HIGH (ref 70–99)
Potassium: 4.3 mmol/L (ref 3.5–5.1)
Sodium: 139 mmol/L (ref 135–145)
Total Bilirubin: 1 mg/dL (ref 0.3–1.2)
Total Protein: 6.3 g/dL — ABNORMAL LOW (ref 6.5–8.1)

## 2020-04-04 LAB — C-REACTIVE PROTEIN: CRP: 0.5 mg/dL (ref <1.0)

## 2020-04-04 LAB — D-DIMER, QUANTITATIVE: D-Dimer, Quant: <0.27 ug/mL-FEU (ref 0.00–0.50)

## 2020-04-04 LAB — BRAIN NATRIURETIC PEPTIDE: B Natriuretic Peptide: 137.7 pg/mL — ABNORMAL HIGH (ref 0.0–100.0)

## 2020-04-04 LAB — PROCALCITONIN: Procalcitonin: <0.1 ng/mL

## 2020-04-04 NOTE — Plan of Care (Signed)
  Problem: Clinical Measurements: Goal: Will remain free from infection Outcome: Progressing   Problem: Clinical Measurements: Goal: Respiratory complications will improve Outcome: Progressing   Problem: Clinical Measurements: Goal: Cardiovascular complication will be avoided Outcome: Progressing   Problem: Activity: Goal: Risk for activity intolerance will decrease Outcome: Progressing   Problem: Nutrition: Goal: Adequate nutrition will be maintained Outcome: Progressing   Problem: Coping: Goal: Level of anxiety will decrease Outcome: Progressing

## 2020-04-04 NOTE — Progress Notes (Addendum)
PROGRESS NOTE  Amanda Ross NWG:[id number] DOB: 1945-12-16 DOA: 03/24/2020  PCP: [name], MD  Brief History/Interval Summary: 74 y.o. female with history of asthma, HTN, OSA, primary hypothyroidism and left thyroid nodule resented with myalgias, chills, intermittent cough) shortness of breath.  She tested positive for COVID-19 at her PCP office.  She apparently desaturated into the mid 80s and was sent over to the emergency department.  Chest x-ray did not show any significant findings.  CT angiogram did not show PE but did show multifocal patchy airspace opacities right greater than left.   Reason for Visit: Pneumonia due to COVID-19.  Acute respiratory failure with hypoxia.  Consultants: None  Procedures:   CTA - No PE  Leg [place] - No DVT  TTE.  Antibiotics: Anti-infectives (From admission, onward)   Start     Dose/Rate Route Frequency Ordered Stop   03/25/20 1000  remdesivir 100 mg in sodium chloride 0.9 % 100 mL IVPB       "Followed by" Linked Group Details   100 mg 200 mL/hr over 30 Minutes Intravenous Daily 03/24/20 1533 03/28/20 0900   03/24/20 1545  remdesivir 200 mg in sodium chloride 0.9% 250 mL IVPB       "Followed by" Linked Group Details   200 mg 580 mL/hr over 30 Minutes Intravenous Once 03/24/20 1533 03/24/20 1940      Subjective/Interval History:  Patient in bed, appears comfortable, denies any headache, no fever, no chest pain or pressure, +ve shortness of breath , no abdominal pain. No focal weakness.    Assessment/Plan:  Acute Hypoxic Resp. Failure/Pneumonia due to COVID-19 - she is fully vaccinated and unfortunately has breakthrough disease, initially responded well to IV steroids and Remdesivir however on 03/29/2020 more hypoxic, she was given Actemra on 03/29/2020 early in the morning, currently on HHFL and breathing only slightly better.  As needed Lasix being used as well, repeat IV dose on 04/03/2020, overall still quite tenuous, risk of  death is very high explained clearly to patient and husband, continue to monitor closely. Check TTE which is pending.  Encouraged her to sit up in chair in the daytime use I-S and flutter valve for pulmonary toiletry and prone at night.  SpO2: 94 % O2 Flow Rate (L/min): 30 L/min FiO2 (%): 80 %   Recent Labs  Lab 03/31/20 0612 04/01/20 0317 04/02/20 0328 04/03/20 0248 04/04/20 0412  WBC 9.1 10.2 12.3* 13.6* 17.0*  HGB 13.6 13.2 13.9 13.7 14.9  HCT 39.8 40.1 41.1 40.5 44.2  PLT 314 288 349 317 320  CRP 0.7 0.5 0.6 <0.5 0.5  BNP 94.2 78.9 90.8 76.5 137.7*  DDIMER 0.44 0.43 <0.27 0.39 <0.27  PROCALCITON <0.10 <0.10 <0.10 <0.10 <0.10  AST 16 16 15 17 17   ALT 21 21 21 21 22   ALKPHOS 58 56 54 49 60  BILITOT 0.8 0.8 0.6 0.7 1.0  ALBUMIN 2.8* 2.7* 2.8* 2.8* 3.0*      1 out of 2 staph epidermis positive blood culture.  Contaminant.     Acute kidney injury on chronic kidney disease stage IIIb -   her baseline creatinine is around 1.4, AKI likely due to ATN caused by acute infection and dehydration, after diuresis on 04/03/2020, slightly worse, hold further diuretics and monitor, if continues to trend up may require gentle fluids.  Obstructive sleep apnea - Continue with oxygen.  History of asthma - Steroids and inhalers as above.  Essential hypertension - on low dose B Blocker, added  IV hydralazine as needed.    History of overactive bladder - Monitor. Continue oxybutynin.  Mild Thrombocytopenia - Due to viral infection. Resolved.  Morbid obesity - BMI  Of > 39 Follow with PCP for weight loss.     DVT Prophylaxis: Lovenox Code Status: Full code Family Communication: Discussed with patient.  Called her husband Amanda Ross  # [phone] and updated 03/28/20, 03/29/20, 03/31/20, 04/03/20 explained tenuous condition, 04/04/20 explained tenuous condition and poor prognosis if intubation is required, for now full code.  Disposition Plan: Hopefully return home when improved  Status  is: Inpatient  Remains inpatient appropriate because:IV treatments appropriate due to intensity of illness or inability to take PO and Inpatient level of care appropriate due to severity of illness   Dispo: The patient is from: Home              Anticipated d/c is to: Home              Anticipated d/c date is: 2 days              Patient currently is not medically stable to d/c.   Medications:  Scheduled: . vitamin C  500 mg Oral Daily  . aspirin EC  81 mg Oral Daily  . cholecalciferol  2,000 Units Oral Daily  . enoxaparin (LOVENOX) injection  40 mg Subcutaneous Q12H  . Ipratropium-Albuterol  1 puff Inhalation Q6H  . methylPREDNISolone (SOLU-MEDROL) injection  60 mg Intravenous Q12H  . metoprolol tartrate  12.5 mg Oral BID  . oxybutynin  10 mg Oral QHS  . pantoprazole  40 mg Oral Daily  . traZODone  50 mg Oral QHS  . zinc sulfate  220 mg Oral Daily   Continuous:  NLZ:[id number], baclofen, bisacodyl, chlorpheniramine-HYDROcodone, guaiFENesin-dextromethorphan, hydrALAZINE, hydroxypropyl methylcellulose / hypromellose, magnesium citrate, [DISCONTINUED] ondansetron **OR** ondansetron (ZOFRAN) IV, polyethylene glycol, simethicone, sodium chloride, zolpidem   Objective:  Vital Signs  Vitals:   04/03/20 2106 04/04/20 0230 04/04/20 0538 04/04/20 0734  BP: 105/67  110/72 122/90  Pulse: 84 96 78 84  Resp: 20 20 20 20   Temp: 97.9 F (36.6 C)  98.3 F (36.8 C) 99 F (37.2 C)  TempSrc: Oral  Oral   SpO2: 90% 90% 90% 94%  Weight:      Height:        Intake/Output Summary (Last 24 hours) at 04/04/2020 0917 Last data filed at 04/03/2020 1000 Gross per 24 hour  Intake 15 ml  Output --  Net 15 ml   Filed Weights   03/24/20 1033  Weight: 102.1 kg    EXAM  Awake Alert, No new F.N deficits, Normal affect Melbeta.AT,PERRAL Supple Neck,No JVD, No cervical lymphadenopathy appriciated.  Symmetrical Chest wall movement, Good air movement bilaterally, CTAB RRR,No Gallops, Rubs  or new Murmurs, No Parasternal Heave +ve B.Sounds, Abd Soft, No tenderness, No organomegaly appriciated, No rebound - guarding or rigidity. No Cyanosis, Clubbing or edema, No new Rash or bruise    Lab Results:  Data Reviewed: I have personally reviewed following labs and imaging studies  Recent Labs  Lab 03/31/20 0612 04/01/20 0317 04/02/20 0328 04/03/20 0248 04/04/20 0412  WBC 9.1 10.2 12.3* 13.6* 17.0*  HGB 13.6 13.2 13.9 13.7 14.9  HCT 39.8 40.1 41.1 40.5 44.2  PLT 314 288 349 317 320  MCV 87.1 87.2 86.9 86.2 86.5  MCH 29.8 28.7 29.4 29.1 29.2  MCHC 34.2 32.9 33.8 33.8 33.7  RDW 13.6 13.6 13.8 13.5 13.6  LYMPHSABS 0.8 0.7 0.7  0.6* 0.6*  MONOABS 0.6 0.5 0.5 0.3 0.5  EOSABS 0.0 0.0 0.0 0.0 0.0  BASOSABS 0.0 0.0 0.0 0.0 0.0    Recent Labs  Lab 03/29/20 0408 03/30/20 0515 03/31/20 0612 04/01/20 0317 04/02/20 0328 04/03/20 0248 04/04/20 0412  NA 141   < > 139 138 138 135 139  K 4.1   < > 4.4 4.5 4.6 4.4 4.3  CL 102   < > 99 100 99 98 100  CO2 28   < > 28 26 27 28 28   GLUCOSE 164*   < > 143* 143* 145* 128* 177*  BUN 33*   < > 40* 40* 42* 44* 48*  CREATININE 1.44*   < > 1.23* 1.29* 1.59* 1.44* 1.87*  CALCIUM 10.8*   < > 11.0* 11.1* 11.2* 11.3* 11.4*  AST 18   < > 16 16 15 17 17   ALT 23   < > 21 21 21 21 22   ALKPHOS 57   < > 58 56 54 49 60  BILITOT 0.4   < > 0.8 0.8 0.6 0.7 1.0  ALBUMIN 2.7*   < > 2.8* 2.7* 2.8* 2.8* 3.0*  MG 2.1  --   --   --   --   --   --   CRP 2.5*   < > 0.7 0.5 0.6 <0.5 0.5  DDIMER 0.62*   < > 0.44 0.43 <0.27 0.39 <0.27  PROCALCITON <0.10   < > <0.10 <0.10 <0.10 <0.10 <0.10  BNP 99.5   < > 94.2 78.9 90.8 76.5 137.7*   < > = values in this interval not displayed.       Recent Results (from the past 240 hour(s))  Culture, blood (routine x 2)     Status: None   Collection Time: 03/25/20 10:02 PM   Specimen: BLOOD LEFT ARM  Result Value Ref Range Status   Specimen Description BLOOD LEFT ARM  Final   Special Requests   Final    BOTTLES  DRAWN AEROBIC AND ANAEROBIC Blood Culture adequate volume   Culture   Final    NO GROWTH 5 DAYS Performed at [facility], [address], [place], [place] [address]    Report Status 03/30/2020 FINAL  Final  Culture, blood (routine x 2)     Status: None   Collection Time: 03/25/20 10:03 PM   Specimen: BLOOD LEFT HAND  Result Value Ref Range Status   Specimen Description BLOOD LEFT HAND  Final   Special Requests   Final    BOTTLES DRAWN AEROBIC AND ANAEROBIC Blood Culture adequate volume   Culture   Final    NO GROWTH 5 DAYS Performed at [facility], [place] [address] [place], [place] [address]    Report Status 03/30/2020 FINAL  Final      Radiology Studies: DG Chest Port 1 View  Result Date: 04/03/2020 CLINICAL DATA:  Shortness of breath. EXAM: PORTABLE CHEST 1 VIEW COMPARISON:  03/31/2020 chest radiograph and prior. FINDINGS: Bilateral mid to lower lung patchy opacities are more conspicuous than prior exam. No pneumothorax or pleural effusion. Mild cardiomegaly and aortic arch atherosclerotic calcifications. Osseous structures are unchanged. IMPRESSION: Slightly increased conspicuity of bilateral pulmonary opacities concerning for multifocal pneumonia. Electronically Signed   By: Primitivo Gauze M.D.   On: 04/03/2020 08:24     LOS: 11 days   [facility] Pager on [email or web address]  04/04/2020, 9:17 AM

## 2020-04-04 NOTE — Progress Notes (Signed)
Physical Therapy Treatment Patient Details Name: MADDUX FIRST MRN: [id number] DOB: 10-05-1945 Today's Date: 04/04/2020    History of Present Illness 74 y.o. female with history of asthma, HTN, OSA, primary hypothyroidism and left thyroid nodule resented with myalgias, chills, intermittent cough) shortness of breath.  She tested positive for COVID-19 at her PCP office.  She apparently desaturated into the mid 80s and was sent over to the emergency department.  Chest x-ray did not show any significant findings.  CT angiogram did not show PE but did show multifocal patchy airspace opacities right greater than left.    PT Comments    Patient received in chair, still on 30LPM HHFNC and upset/frustrated this morning, "I just don't understand why I'm not getting better". Max emotional support provided during session. Still limited to standing exercise intervals including standing hip ABD, mini-squats, heel raises, standing hip extension, seated thoracic rotation, seated thoracic flexion/extension, and backwards shoulder rolls. Spo2 drop to 86% with activity but quickly recovered back to 88-90% with seated rest, HR up to 112BPM max today. Left sitting up in recliner with all needs met this morning, will continue to follow acutely.    Follow Up Recommendations  Home health PT;Supervision/Assistance - 24 hour     Equipment Recommendations  None recommended by PT    Recommendations for Other Services       Precautions / Restrictions Precautions Precautions: Fall Precaution Comments: watch O2 Restrictions Weight Bearing Restrictions: No    Mobility  Bed Mobility               General bed mobility comments: sitting in the chair on arrival  Transfers Overall transfer level: Modified independent Equipment used: None Transfers: Sit to/from Stand Sit to Stand: Modified independent (Device/Increase time)         General transfer comment: No assist needed for sit to stand  transfers, supervision for safety with mobility   Ambulation/Gait             General Gait Details: not able today due to on HHFNC, but did standing exercise intervals with good tolerance   Stairs             Wheelchair Mobility    Modified Rankin (Stroke Patients Only)       Balance Overall balance assessment: Needs assistance   Sitting balance-Leahy Scale: Good     Standing balance support: Bilateral upper extremity supported Standing balance-Leahy Scale: Fair Standing balance comment: MinA dynamically without BUE support                            Cognition Arousal/Alertness: Awake/alert Behavior During Therapy: WFL for tasks assessed/performed Overall Cognitive Status: Within Functional Limits for tasks assessed                                 General Comments: upset about news from MD that she is not really getting better, max emotional support provided today      Exercises      General Comments General comments (skin integrity, edema, etc.): standing exercise intervals instead of gait due to still being on HHFNC; HR 112BPM at most, SpO2 drop to 86% on 30LPM but recovered to 88-90% much quicker today      Pertinent Vitals/Pain Pain Assessment: 0-10 Pain Score: 2  Pain Location: chest muscles and ribs when breathing Pain Descriptors / Indicators: Aching;Sore;Discomfort Pain Intervention(s):  Limited activity within patient's tolerance;Monitored during session    Home Living                      Prior Function            PT Goals (current goals can now be found in the care plan section) Acute Rehab PT Goals Patient Stated Goal: go home today PT Goal Formulation: With patient Time For Goal Achievement: 04/10/20 Potential to Achieve Goals: Good Progress towards PT goals: Progressing toward goals (slowly)    Frequency    Min 3X/week      PT Plan Current plan remains appropriate    Co-evaluation               AM-PAC PT "6 Clicks" Mobility   Outcome Measure  Help needed turning from your back to your side while in a flat bed without using bedrails?: A Little Help needed moving from lying on your back to sitting on the side of a flat bed without using bedrails?: A Little Help needed moving to and from a bed to a chair (including a wheelchair)?: A Little Help needed standing up from a chair using your arms (e.g., wheelchair or bedside chair)?: None Help needed to walk in hospital room?: A Little Help needed climbing 3-5 steps with a railing? : A Little 6 Click Score: 19    End of Session Equipment Utilized During Treatment: Oxygen Activity Tolerance: Patient tolerated treatment well Patient left: in chair;with call bell/phone within reach Nurse Communication: Mobility status PT Visit Diagnosis: Other abnormalities of gait and mobility (R26.89);Muscle weakness (generalized) (M62.81)     Time: 4174-0814 PT Time Calculation (min) (ACUTE ONLY): 27 min  Charges:  $Therapeutic Exercise: 23-37 mins                     Windell Norfolk, DPT, PN1   Supplemental Physical Therapist [place]    Pager [phone] Acute Rehab Office [phone]

## 2020-04-05 DIAGNOSIS — U071 COVID-19: Secondary | ICD-10-CM | POA: Diagnosis not present

## 2020-04-05 DIAGNOSIS — J9601 Acute respiratory failure with hypoxia: Secondary | ICD-10-CM | POA: Diagnosis not present

## 2020-04-05 LAB — CBC WITH DIFFERENTIAL/PLATELET
Abs Immature Granulocytes: 0.18 10*3/uL — ABNORMAL HIGH (ref 0.00–0.07)
Basophils Absolute: 0 10*3/uL (ref 0.0–0.1)
Basophils Relative: 0 %
Eosinophils Absolute: 0 10*3/uL (ref 0.0–0.5)
Eosinophils Relative: 0 %
HCT: 43 % (ref 36.0–46.0)
Hemoglobin: 14.3 g/dL (ref 12.0–15.0)
Immature Granulocytes: 1 %
Lymphocytes Relative: 3 %
Lymphs Abs: 0.6 10*3/uL — ABNORMAL LOW (ref 0.7–4.0)
MCH: 29.1 pg (ref 26.0–34.0)
MCHC: 33.3 g/dL (ref 30.0–36.0)
MCV: 87.6 fL (ref 80.0–100.0)
Monocytes Absolute: 0.3 10*3/uL (ref 0.1–1.0)
Monocytes Relative: 2 %
Neutro Abs: 17.1 10*3/uL — ABNORMAL HIGH (ref 1.7–7.7)
Neutrophils Relative %: 94 %
Platelets: 286 10*3/uL (ref 150–400)
RBC: 4.91 MIL/uL (ref 3.87–5.11)
RDW: 14 % (ref 11.5–15.5)
WBC: 18.1 10*3/uL — ABNORMAL HIGH (ref 4.0–10.5)
nRBC: 0 % (ref 0.0–0.2)

## 2020-04-05 LAB — COMPREHENSIVE METABOLIC PANEL
ALT: 23 U/L (ref 0–44)
AST: 18 U/L (ref 15–41)
Albumin: 2.9 g/dL — ABNORMAL LOW (ref 3.5–5.0)
Alkaline Phosphatase: 50 U/L (ref 38–126)
Anion gap: 13 (ref 5–15)
BUN: 50 mg/dL — ABNORMAL HIGH (ref 8–23)
CO2: 25 mmol/L (ref 22–32)
Calcium: 11.2 mg/dL — ABNORMAL HIGH (ref 8.9–10.3)
Chloride: 99 mmol/L (ref 98–111)
Creatinine, Ser: 1.79 mg/dL — ABNORMAL HIGH (ref 0.44–1.00)
GFR, Estimated: 27 mL/min — ABNORMAL LOW (ref >60)
Glucose, Bld: 194 mg/dL — ABNORMAL HIGH (ref 70–99)
Potassium: 4.4 mmol/L (ref 3.5–5.1)
Sodium: 137 mmol/L (ref 135–145)
Total Bilirubin: 0.8 mg/dL (ref 0.3–1.2)
Total Protein: 5.6 g/dL — ABNORMAL LOW (ref 6.5–8.1)

## 2020-04-05 LAB — PROCALCITONIN: Procalcitonin: <0.1 ng/mL

## 2020-04-05 LAB — C-REACTIVE PROTEIN: CRP: 0.6 mg/dL (ref <1.0)

## 2020-04-05 LAB — BRAIN NATRIURETIC PEPTIDE: B Natriuretic Peptide: 124.3 pg/mL — ABNORMAL HIGH (ref 0.0–100.0)

## 2020-04-05 LAB — D-DIMER, QUANTITATIVE: D-Dimer, Quant: <0.27 ug/mL-FEU (ref 0.00–0.50)

## 2020-04-05 MED ORDER — IPRATROPIUM-ALBUTEROL 20-100 MCG/ACT IN AERS
1.0000 | INHALATION_SPRAY | Freq: Three times a day (TID) | RESPIRATORY_TRACT | Status: DC
  Administered 2020-04-05 – 2020-04-09 (×15): 1 via RESPIRATORY_TRACT

## 2020-04-05 MED ORDER — IPRATROPIUM-ALBUTEROL 20-100 MCG/ACT IN AERS
1.0000 | INHALATION_SPRAY | Freq: Four times a day (QID) | RESPIRATORY_TRACT | Status: DC | PRN
  Administered 2020-04-12: 1 via RESPIRATORY_TRACT

## 2020-04-05 MED ORDER — PROSOURCE PLUS PO LIQD
30.0000 mL | Freq: Two times a day (BID) | ORAL | Status: DC
  Administered 2020-04-05 – 2020-04-16 (×21): 30 mL via ORAL
  Filled 2020-04-05 (×23): qty 30

## 2020-04-05 MED ORDER — BOOST / RESOURCE BREEZE PO LIQD CUSTOM
1.0000 | Freq: Three times a day (TID) | ORAL | Status: DC
  Administered 2020-04-05 – 2020-04-16 (×31): 1 via ORAL
  Filled 2020-04-05: qty 1

## 2020-04-05 MED ORDER — FUROSEMIDE 10 MG/ML IJ SOLN
20.0000 mg | Freq: Once | INTRAMUSCULAR | Status: AC
  Administered 2020-04-05: 20 mg via INTRAVENOUS
  Filled 2020-04-05: qty 2

## 2020-04-05 NOTE — Progress Notes (Signed)
PROGRESS NOTE  Amanda Ross XIP:[id number] DOB: 26-Jan-1946 DOA: 03/24/2020  PCP: [name], MD  Brief History/Interval Summary: 74 y.o. female with history of asthma, HTN, OSA, primary hypothyroidism and left thyroid nodule resented with myalgias, chills, intermittent cough) shortness of breath.  She tested positive for COVID-19 at her PCP office.  She apparently desaturated into the mid 80s and was sent over to the emergency department.  Chest x-ray did not show any significant findings.  CT angiogram did not show PE but did show multifocal patchy airspace opacities right greater than left.   Reason for Visit: Pneumonia due to COVID-19.  Acute respiratory failure with hypoxia.  Consultants: None  Procedures:   CTA - No PE  Leg [place] - No DVT  TTE.  Antibiotics: Anti-infectives (From admission, onward)   Start     Dose/Rate Route Frequency Ordered Stop   03/25/20 1000  remdesivir 100 mg in sodium chloride 0.9 % 100 mL IVPB       "Followed by" Linked Group Details   100 mg 200 mL/hr over 30 Minutes Intravenous Daily 03/24/20 1533 03/28/20 0900   03/24/20 1545  remdesivir 200 mg in sodium chloride 0.9% 250 mL IVPB       "Followed by" Linked Group Details   200 mg 580 mL/hr over 30 Minutes Intravenous Once 03/24/20 1533 03/24/20 1940      Subjective/Interval History:   Patient appears comfortable, she denies any chest pain, she does report dyspnea on cough. .  Assessment/Plan:  Acute Hypoxic Resp. Failure/Pneumonia due to COVID-19  - she is fully vaccinated and unfortunately has breakthrough disease, initially responded well to IV steroids and Remdesivir however on 03/29/2020 more hypoxic, she was given Actemra on 03/29/2020. -She remains with significant oxygen requirement, she is currently on 15 L high flow nasal cannula. -On Lasix as needed, will give another 20 mg of IV Lasix today.  Will follow on 2D echo. -She was encouraged use incentive spirometry, flutter  valve, out of bed to chair.  Encouraged her to sit up in chair in the daytime use I-S and flutter valve for pulmonary toiletry and prone at night.  SpO2: 93 % O2 Flow Rate (L/min): 15 L/min FiO2 (%): 70 %   Recent Labs  Lab 04/01/20 0317 04/02/20 0328 04/03/20 0248 04/04/20 0412 04/05/20 0046  WBC 10.2 12.3* 13.6* 17.0* 18.1*  HGB 13.2 13.9 13.7 14.9 14.3  HCT 40.1 41.1 40.5 44.2 43.0  PLT 288 349 317 320 286  CRP 0.5 0.6 <0.5 0.5 0.6  BNP 78.9 90.8 76.5 137.7* 124.3*  DDIMER 0.43 <0.27 0.39 <0.27 <0.27  PROCALCITON <0.10 <0.10 <0.10 <0.10 <0.10  AST 16 15 17 17 18   ALT 21 21 21 22 23   ALKPHOS 56 54 49 60 50  BILITOT 0.8 0.6 0.7 1.0 0.8  ALBUMIN 2.7* 2.8* 2.8* 3.0* 2.9*      1 out of 2 staph epidermis positive blood culture.   - Contaminant.     Acute kidney injury on chronic kidney disease stage IIIb  -   her baseline creatinine is around 1.4, AKI likely due to ATN caused by acute infection and dehydration, after diuresis on 04/03/2020 -Continue to monitor closely while on IV diuresis.  Obstructive sleep apnea - Continue with oxygen.  History of asthma - Steroids and inhalers as above.  Essential hypertension - on low dose B Blocker, added IV hydralazine as needed.    History of overactive bladder - Monitor. Continue oxybutynin.  Mild Thrombocytopenia -  Due to viral infection. Resolved.  Morbid obesity - BMI  Of > 39 Follow with PCP for weight loss.     DVT Prophylaxis: Lovenox Code Status: Full code Family Communication: Discussed with patient.  Called her husband [name]  # [phone] and updated 04/05/2020 .  Disposition Plan: Hopefully return home when improved  Status is: Inpatient  Remains inpatient appropriate because:IV treatments appropriate due to intensity of illness or inability to take PO and Inpatient level of care appropriate due to severity of illness   Dispo: The patient is from: Home              Anticipated d/c is to: Home               Anticipated d/c date is: 2 days              Patient currently is not medically stable to d/c.   Medications:  Scheduled: . (feeding supplement) PROSource Plus  30 mL Oral BID BM  . vitamin C  500 mg Oral Daily  . aspirin EC  81 mg Oral Daily  . cholecalciferol  2,000 Units Oral Daily  . enoxaparin (LOVENOX) injection  40 mg Subcutaneous Q12H  . feeding supplement  1 Container Oral TID BM  . Ipratropium-Albuterol  1 puff Inhalation TID  . methylPREDNISolone (SOLU-MEDROL) injection  60 mg Intravenous Q12H  . metoprolol tartrate  12.5 mg Oral BID  . oxybutynin  10 mg Oral QHS  . pantoprazole  40 mg Oral Daily  . traZODone  50 mg Oral QHS  . zinc sulfate  220 mg Oral Daily   Continuous:  CXK:[id number], baclofen, bisacodyl, chlorpheniramine-HYDROcodone, guaiFENesin-dextromethorphan, hydrALAZINE, hydroxypropyl methylcellulose / hypromellose, Ipratropium-Albuterol, magnesium citrate, [DISCONTINUED] ondansetron **OR** ondansetron (ZOFRAN) IV, polyethylene glycol, simethicone, sodium chloride, zolpidem   Objective:  Vital Signs  Vitals:   04/04/20 2051 04/05/20 0134 04/05/20 0801 04/05/20 0847  BP:      Pulse: 98 90    Resp: (!) 23 (!) 21    Temp:      TempSrc:      SpO2: 90% 90% 91% 93%  Weight:      Height:       No intake or output data in the 24 hours ending 04/05/20 1118 Filed Weights   03/24/20 1033  Weight: 102.1 kg    EXAM  Awake Alert, Oriented X 3, No new F.N deficits, Normal affect Symmetrical Chest wall movement, Good air movement bilaterally, scattered rales RRR,No Gallops,Rubs or new Murmurs, No Parasternal Heave +ve B.Sounds, Abd Soft, No tenderness, No rebound - guarding or rigidity. No Cyanosis, Clubbing or edema, No new Rash or bruise      Lab Results:  Data Reviewed: I have personally reviewed following labs and imaging studies  Recent Labs  Lab 04/01/20 0317 04/02/20 0328 04/03/20 0248 04/04/20 0412 04/05/20 0046  WBC 10.2 12.3*  13.6* 17.0* 18.1*  HGB 13.2 13.9 13.7 14.9 14.3  HCT 40.1 41.1 40.5 44.2 43.0  PLT 288 349 317 320 286  MCV 87.2 86.9 86.2 86.5 87.6  MCH 28.7 29.4 29.1 29.2 29.1  MCHC 32.9 33.8 33.8 33.7 33.3  RDW 13.6 13.8 13.5 13.6 14.0  LYMPHSABS 0.7 0.7 0.6* 0.6* 0.6*  MONOABS 0.5 0.5 0.3 0.5 0.3  EOSABS 0.0 0.0 0.0 0.0 0.0  BASOSABS 0.0 0.0 0.0 0.0 0.0    Recent Labs  Lab 04/01/20 0317 04/02/20 0328 04/03/20 0248 04/04/20 0412 04/05/20 0046  NA 138 138 135 139 137  K 4.5  4.6 4.4 4.3 4.4  CL 100 99 98 100 99  CO2 26 27 28 28 25   GLUCOSE 143* 145* 128* 177* 194*  BUN 40* 42* 44* 48* 50*  CREATININE 1.29* 1.59* 1.44* 1.87* 1.79*  CALCIUM 11.1* 11.2* 11.3* 11.4* 11.2*  AST 16 15 17 17 18   ALT 21 21 21 22 23   ALKPHOS 56 54 49 60 50  BILITOT 0.8 0.6 0.7 1.0 0.8  ALBUMIN 2.7* 2.8* 2.8* 3.0* 2.9*  CRP 0.5 0.6 <0.5 0.5 0.6  DDIMER 0.43 <0.27 0.39 <0.27 <0.27  PROCALCITON <0.10 <0.10 <0.10 <0.10 <0.10  BNP 78.9 90.8 76.5 137.7* 124.3*       No results found for this or any previous visit (from the past 240 hour(s)).    Radiology Studies: No results found.   LOS: 12 days   Investment banker, corporate on [email or web address]  04/05/2020, 11:18 AM

## 2020-04-05 NOTE — Plan of Care (Signed)
  Problem: Education: Goal: Knowledge of General Education information will improve Description: Including pain rating scale, medication(s)/side effects and non-pharmacologic comfort measures Outcome: Progressing   Problem: Clinical Measurements: Goal: Ability to maintain clinical measurements within normal limits will improve Outcome: Progressing   Problem: Clinical Measurements: Goal: Will remain free from infection Outcome: Progressing   Problem: Clinical Measurements: Goal: Respiratory complications will improve Outcome: Progressing   Problem: Clinical Measurements: Goal: Cardiovascular complication will be avoided Outcome: Progressing   Problem: Coping: Goal: Level of anxiety will decrease Outcome: Progressing   Problem: Elimination: Goal: Will not experience complications related to bowel motility Outcome: Progressing   Problem: Elimination: Goal: Will not experience complications related to urinary retention Outcome: Progressing   Problem: Pain Managment: Goal: General experience of comfort will improve Outcome: Progressing   Problem: Safety: Goal: Ability to remain free from injury will improve Outcome: Progressing   Problem: Skin Integrity: Goal: Risk for impaired skin integrity will decrease Outcome: Progressing

## 2020-04-05 NOTE — Progress Notes (Signed)
Initial Nutrition Assessment  DOCUMENTATION CODES:   Obesity unspecified  INTERVENTION:    Recommend diet liberalization to encourage adequate po intake  Boost Breeze po TID, each supplement provides 250 kcal and 9 grams of protein  4ml Prosource Plus po BID, each supplement provides 100 kcal and 15 grams of protein  NUTRITION DIAGNOSIS:   Increased nutrient needs related to catabolic illness (PVXYI-01) as evidenced by estimated needs.    GOAL:   Patient will meet greater than or equal to 90% of their needs    MONITOR:   PO intake, Supplement acceptance, Labs, Weight trends, I & O's  REASON FOR ASSESSMENT:   LOS    ASSESSMENT:   Pt with a PMH significant for asthma, HTN, OSA, hypothyroidism and L thyroid nodule admitted with acute hypoxemic respiratory failure due to COVID-19.  Pt reports that appetite has been fine and that she has been able to eat most of what is provided to her. There is only 1 meal documented as 50% meal completion, but pt states her husband has been bringing her additional food that she is eating more of as she feels the hospital food is bland. Will recommend diet be liberalized to improve taste of food and will order oral nutrition supplements to provide additional kcals/protein as pt's needs are increased due to COVID-19 infection.   Reviewed wt history. No significant wt changes noted. Pt also denies any significant wt loss.   Labs: corrected Ca 12.08 (H) Medications: Vitamin C, Vitamin D3, Solu-medrol, Protonix, Zinc sulfate  Diet Order:   Diet Order            Diet Heart Room service appropriate? Yes; Fluid consistency: Thin  Diet effective now                 EDUCATION NEEDS:   Not appropriate for education at this time  Skin:  Skin Assessment: Reviewed RN Assessment  Last BM:  10/18 type 5  Height:   Ht Readings from Last 1 Encounters:  03/24/20 5\' 3"  (1.6 m)    Weight:   Wt Readings from Last 1 Encounters:   03/24/20 102.1 kg    BMI:  Body mass index is 39.86 kg/m.  Estimated Nutritional Needs:   Kcal:  2150-2350  Protein:  115-135 grams  Fluid:  >2L/d    Larkin Ina, MS, RD, LDN RD pager number and weekend/on-call pager number located in [place].

## 2020-04-05 NOTE — Progress Notes (Signed)
Occupational Therapy Treatment Patient Details Name: Amanda Ross MRN: [id number] DOB: Nov 03, 1945 Today's Date: 04/05/2020    History of present illness 74 y.o. female with history of asthma, HTN, OSA, primary hypothyroidism and left thyroid nodule resented with myalgias, chills, intermittent cough) shortness of breath.  She tested positive for COVID-19 at her PCP office.  She apparently desaturated into the mid 80s and was sent over to the emergency department.  Chest x-ray did not show any significant findings.  CT angiogram did not show PE but did show multifocal patchy airspace opacities right greater than left.   OT comments  Pt with recent placement on HHFNC, but now reduced and received on 15 L HFNC. Pt motivated to be able to mobilize room again. Guided pt in various ADLs standing at sink with pt able to stand for 3-5 min at a time before seated rest break needed. Pt desats to 79% at the lowest on 15 L O2 during activities, but sustained 84-85%. Reinforced energy conservation strategies with pt able to demonstrate implementations of techniques with minimal cues. Recommend HHOT follow up at discharge. Plan to further progress activity tolerance during ADLs while weaning O2.    Follow Up Recommendations  Home health OT;Supervision - Intermittent    Equipment Recommendations  Tub/shower bench    Recommendations for Other Services      Precautions / Restrictions Precautions Precautions: Fall Precaution Comments: watch O2 Restrictions Weight Bearing Restrictions: No       Mobility Bed Mobility               General bed mobility comments: sitting in the chair on arrival  Transfers Overall transfer level: Modified independent Equipment used: None Transfers: Sit to/from Stand Sit to Stand: Modified independent (Device/Increase time)         General transfer comment: No assist needed for sit to stand transfers, supervision for safety with mobility     Balance  Overall balance assessment: Needs assistance Sitting-balance support: No upper extremity supported;Feet supported Sitting balance-Leahy Scale: Good     Standing balance support: No upper extremity supported;During functional activity Standing balance-Leahy Scale: Fair Standing balance comment: fair static standing, reaching out for one UE support during mobility                            ADL either performed or assessed with clinical judgement   ADL Overall ADL's : Needs assistance/impaired     Grooming: Supervision/safety;Standing;Wash/dry face;Wash/dry hands;Oral care Grooming Details (indicate cue type and reason): Supervision for safety in monitoring of O2 during various grooming tasks seated and standing at sink. Pt able to stand 3-5 min for tasks before indicating need for seated rest break                             Functional mobility during ADLs: Supervision/safety General ADL Comments: Pt with recent increased to [place] and reduction today back to HFNC. Continues with decreased cardiopulmonary tolerance     Vision   Vision Assessment?: No apparent visual deficits   Perception     Praxis      Cognition Arousal/Alertness: Awake/alert Behavior During Therapy: WFL for tasks assessed/performed Overall Cognitive Status: Within Functional Limits for tasks assessed  Exercises     Shoulder Instructions       General Comments Pt received on 15 L HFNC, 87% at rest. During short mobility to/from sink, pt desat to 80%, 79% during prolonged ADLs in standing. Sustained during activtiy 84-85%. Cues for pursed lip breathing and energy conservation sttrategies with increase to 88% at rest on 15 L HFNC    Pertinent Vitals/ Pain       Pain Assessment: No/denies pain  Home Living                                          Prior Functioning/Environment               Frequency  Min 2X/week        Progress Toward Goals  OT Goals(current goals can now be found in the care plan section)  Progress towards OT goals: Progressing toward goals  Acute Rehab OT Goals Patient Stated Goal: go home soon OT Goal Formulation: With patient Time For Goal Achievement: 04/11/20 Potential to Achieve Goals: Good ADL Goals Pt Will Transfer to Toilet: with modified independence;ambulating;regular height toilet Pt Will Perform Tub/Shower Transfer: Tub transfer;with modified independence;ambulating;shower seat Additional ADL Goal #1: Pt to demonstrate implementation of at least 2 energy conservation strategies during ADLs/IADLs Additional ADL Goal #2: Pt to demonstrate ability to complete ADL/IADL tasks >5-8 minutes without rest break in order to improve endurance  Plan Discharge plan needs to be updated    Co-evaluation                 AM-PAC OT "6 Clicks" Daily Activity     Outcome Measure   Help from another person eating meals?: None Help from another person taking care of personal grooming?: A Little Help from another person toileting, which includes using toliet, bedpan, or urinal?: A Little Help from another person bathing (including washing, rinsing, drying)?: A Little Help from another person to put on and taking off regular upper body clothing?: None Help from another person to put on and taking off regular lower body clothing?: A Little 6 Click Score: 20    End of Session Equipment Utilized During Treatment: Oxygen  OT Visit Diagnosis: Other (comment) (decreased cardiopulmonary tolerance)   Activity Tolerance Patient tolerated treatment well   Patient Left in chair;with call bell/phone within reach   Nurse Communication Other (comment) (O2)        Time: 4650-3546 OT Time Calculation (min): 33 min  Charges: OT General Charges $OT Visit: 1 Visit OT Treatments $Self Care/Home Management : 8-22 mins $Therapeutic Activity: 8-22  mins  Layla Maw, OTR/L   Layla Maw 04/05/2020, 2:43 PM

## 2020-04-06 ENCOUNTER — Inpatient Hospital Stay (HOSPITAL_COMMUNITY): Payer: Medicare Other

## 2020-04-06 DIAGNOSIS — J9601 Acute respiratory failure with hypoxia: Secondary | ICD-10-CM

## 2020-04-06 DIAGNOSIS — U071 COVID-19: Secondary | ICD-10-CM | POA: Diagnosis not present

## 2020-04-06 LAB — CBC WITH DIFFERENTIAL/PLATELET
Abs Immature Granulocytes: 0.16 10*3/uL — ABNORMAL HIGH (ref 0.00–0.07)
Basophils Absolute: 0 10*3/uL (ref 0.0–0.1)
Basophils Relative: 0 %
Eosinophils Absolute: 0 10*3/uL (ref 0.0–0.5)
Eosinophils Relative: 0 %
HCT: 40.6 % (ref 36.0–46.0)
Hemoglobin: 13.6 g/dL (ref 12.0–15.0)
Immature Granulocytes: 1 %
Lymphocytes Relative: 3 %
Lymphs Abs: 0.7 10*3/uL (ref 0.7–4.0)
MCH: 29.2 pg (ref 26.0–34.0)
MCHC: 33.5 g/dL (ref 30.0–36.0)
MCV: 87.3 fL (ref 80.0–100.0)
Monocytes Absolute: 0.4 10*3/uL (ref 0.1–1.0)
Monocytes Relative: 2 %
Neutro Abs: 18.6 10*3/uL — ABNORMAL HIGH (ref 1.7–7.7)
Neutrophils Relative %: 94 %
Platelets: 224 10*3/uL (ref 150–400)
RBC: 4.65 MIL/uL (ref 3.87–5.11)
RDW: 13.9 % (ref 11.5–15.5)
WBC: 19.8 10*3/uL — ABNORMAL HIGH (ref 4.0–10.5)
nRBC: 0 % (ref 0.0–0.2)

## 2020-04-06 LAB — BRAIN NATRIURETIC PEPTIDE: B Natriuretic Peptide: 121.8 pg/mL — ABNORMAL HIGH (ref 0.0–100.0)

## 2020-04-06 LAB — COMPREHENSIVE METABOLIC PANEL
ALT: 24 U/L (ref 0–44)
AST: 18 U/L (ref 15–41)
Albumin: 2.6 g/dL — ABNORMAL LOW (ref 3.5–5.0)
Alkaline Phosphatase: 49 U/L (ref 38–126)
Anion gap: 11 (ref 5–15)
BUN: 55 mg/dL — ABNORMAL HIGH (ref 8–23)
CO2: 25 mmol/L (ref 22–32)
Calcium: 10.8 mg/dL — ABNORMAL HIGH (ref 8.9–10.3)
Chloride: 100 mmol/L (ref 98–111)
Creatinine, Ser: 1.42 mg/dL — ABNORMAL HIGH (ref 0.44–1.00)
GFR, Estimated: 36 mL/min — ABNORMAL LOW (ref >60)
Glucose, Bld: 139 mg/dL — ABNORMAL HIGH (ref 70–99)
Potassium: 4.5 mmol/L (ref 3.5–5.1)
Sodium: 136 mmol/L (ref 135–145)
Total Bilirubin: 0.9 mg/dL (ref 0.3–1.2)
Total Protein: 5.3 g/dL — ABNORMAL LOW (ref 6.5–8.1)

## 2020-04-06 LAB — C-REACTIVE PROTEIN: CRP: 0.6 mg/dL (ref <1.0)

## 2020-04-06 LAB — PROCALCITONIN: Procalcitonin: <0.1 ng/mL

## 2020-04-06 LAB — ECHOCARDIOGRAM LIMITED
Area-P 1/2: 3.68 cm2
Height: 63 in
Weight: 3600 oz

## 2020-04-06 LAB — D-DIMER, QUANTITATIVE: D-Dimer, Quant: 0.36 ug/mL-FEU (ref 0.00–0.50)

## 2020-04-06 MED ORDER — PERFLUTREN LIPID MICROSPHERE
1.0000 mL | INTRAVENOUS | Status: AC | PRN
  Administered 2020-04-06: 5 mL via INTRAVENOUS
  Filled 2020-04-06: qty 10

## 2020-04-06 MED ORDER — FUROSEMIDE 10 MG/ML IJ SOLN
40.0000 mg | Freq: Once | INTRAMUSCULAR | Status: AC
  Administered 2020-04-06: 40 mg via INTRAVENOUS
  Filled 2020-04-06: qty 4

## 2020-04-06 MED ORDER — DICLOFENAC SODIUM 1 % EX GEL
2.0000 g | Freq: Two times a day (BID) | CUTANEOUS | Status: DC
  Administered 2020-04-06 – 2020-04-16 (×19): 2 g via TOPICAL
  Filled 2020-04-06: qty 100

## 2020-04-06 NOTE — Progress Notes (Signed)
PROGRESS NOTE  Amanda Ross ALP:[id number] DOB: 09/08/1945 DOA: 03/24/2020  PCP: [name], MD  Brief History/Interval Summary: 74 y.o. female with history of asthma, HTN, OSA, primary hypothyroidism and left thyroid nodule resented with myalgias, chills, intermittent cough) shortness of breath.  She tested positive for COVID-19 at her PCP office.  She apparently desaturated into the mid 80s and was sent over to the emergency department.  Chest x-ray did not show any significant findings.  CT angiogram did not show PE but did show multifocal patchy airspace opacities right greater than left.   Reason for Visit: Pneumonia due to COVID-19.  Acute respiratory failure with hypoxia.  Consultants: None  Procedures:   CTA - No PE  Leg [place] - No DVT  TTE.  Antibiotics: Anti-infectives (From admission, onward)   Start     Dose/Rate Route Frequency Ordered Stop   03/25/20 1000  remdesivir 100 mg in sodium chloride 0.9 % 100 mL IVPB       "Followed by" Linked Group Details   100 mg 200 mL/hr over 30 Minutes Intravenous Daily 03/24/20 1533 03/28/20 0900   03/24/20 1545  remdesivir 200 mg in sodium chloride 0.9% 250 mL IVPB       "Followed by" Linked Group Details   200 mg 580 mL/hr over 30 Minutes Intravenous Once 03/24/20 1533 03/24/20 1940      Subjective/Interval History:   Patient appears comfortable, denies any chest pain, reports dyspnea with activity, reports her chronic arthritis pain in right hip and bilateral hands.   Assessment/Plan:  Acute Hypoxic Resp. Failure/Pneumonia due to COVID-19  - she is fully vaccinated and unfortunately has breakthrough disease, initially responded well to IV steroids and Remdesivir however on 03/29/2020 more hypoxic, she was given Actemra on 03/29/2020. -She remains with significant oxygen requirement, she is currently on 15 L high flow nasal cannula. -On Lasix as needed, follow on 2D echo, still pending, will give 40 mg of IV Lasix  today.  . -She was encouraged use incentive spirometry, flutter valve, out of bed to chair.  Encouraged her to sit up in chair in the daytime use I-S and flutter valve for pulmonary toiletry and prone at night.  SpO2: 91 % O2 Flow Rate (L/min): 15 L/min FiO2 (%): 70 %   Recent Labs  Lab 04/02/20 0328 04/03/20 0248 04/04/20 0412 04/05/20 0046 04/06/20 0357  WBC 12.3* 13.6* 17.0* 18.1* 19.8*  HGB 13.9 13.7 14.9 14.3 13.6  HCT 41.1 40.5 44.2 43.0 40.6  PLT 349 317 320 286 224  CRP 0.6 <0.5 0.5 0.6 0.6  BNP 90.8 76.5 137.7* 124.3* 121.8*  DDIMER <0.27 0.39 <0.27 <0.27 0.36  PROCALCITON <0.10 <0.10 <0.10 <0.10 <0.10  AST 15 17 17 18 18   ALT 21 21 22 23 24   ALKPHOS 54 49 60 50 49  BILITOT 0.6 0.7 1.0 0.8 0.9  ALBUMIN 2.8* 2.8* 3.0* 2.9* 2.6*      1 out of 2 staph epidermis positive blood culture.   - Contaminant.     Acute kidney injury on chronic kidney disease stage IIIb  -   her baseline creatinine is around 1.4, AKI likely due to ATN caused by acute infection and dehydration, after diuresis on 04/03/2020 -Monitor closely as she is on IV diuresis  Obstructive sleep apnea  - Continue with oxygen.  History of asthma  - Steroids and inhalers as above.  Essential hypertension  - on low dose B Blocker, added IV hydralazine as needed.  History of overactive bladder  - Monitor. Continue oxybutynin.  Mild Thrombocytopenia  - Due to viral infection. Resolved.  Morbid obesity  - BMI  Of > 39 Follow with PCP for weight loss.   DVT Prophylaxis: Lovenox Code Status: Full code Family Communication: Discussed with patient.  Called her husband [name]  # [phone] and updated 04/05/2020 .  Disposition Plan: Hopefully return home when improved  Status is: Inpatient  Remains inpatient appropriate because:IV treatments appropriate due to intensity of illness or inability to take PO and Inpatient level of care appropriate due to severity of illness   Dispo: The  patient is from: Home              Anticipated d/c is to: Home              Anticipated d/c date is: 2 days              Patient currently is not medically stable to d/c.   Medications:  Scheduled: . (feeding supplement) PROSource Plus  30 mL Oral BID BM  . vitamin C  500 mg Oral Daily  . aspirin EC  81 mg Oral Daily  . cholecalciferol  2,000 Units Oral Daily  . diclofenac Sodium  2 g Topical BID  . enoxaparin (LOVENOX) injection  40 mg Subcutaneous Q12H  . feeding supplement  1 Container Oral TID BM  . Ipratropium-Albuterol  1 puff Inhalation TID  . methylPREDNISolone (SOLU-MEDROL) injection  60 mg Intravenous Q12H  . metoprolol tartrate  12.5 mg Oral BID  . oxybutynin  10 mg Oral QHS  . pantoprazole  40 mg Oral Daily  . traZODone  50 mg Oral QHS  . zinc sulfate  220 mg Oral Daily   Continuous:  YSA:YTKZSWFUXNATF, baclofen, bisacodyl, chlorpheniramine-HYDROcodone, guaiFENesin-dextromethorphan, hydrALAZINE, hydroxypropyl methylcellulose / hypromellose, Ipratropium-Albuterol, magnesium citrate, [DISCONTINUED] ondansetron **OR** ondansetron (ZOFRAN) IV, perflutren lipid microspheres (DEFINITY) IV suspension, polyethylene glycol, simethicone, sodium chloride, zolpidem   Objective:  Vital Signs  Vitals:   04/05/20 2235 04/06/20 0212 04/06/20 0618 04/06/20 0744  BP:   111/75   Pulse: 85  90 (!) 101  Resp: (!) 23  20   Temp:   98.3 F (36.8 C)   TempSrc:   Oral   SpO2:  91% 98% 91%  Weight:      Height:       No intake or output data in the 24 hours ending 04/06/20 1105 Filed Weights   03/24/20 1033  Weight: 102.1 kg    EXAM  Awake Alert, Oriented X 3, No new F.N deficits, Normal affect Symmetrical Chest wall movement, Good air movement bilaterally, scattered rales. RRR,No Gallops,Rubs or new Murmurs, No Parasternal Heave +ve B.Sounds, Abd Soft, No tenderness, No rebound - guarding or rigidity. No Cyanosis, Clubbing or edema, No new Rash or bruise     Lab  Results:  Data Reviewed: I have personally reviewed following labs and imaging studies  Recent Labs  Lab 04/02/20 0328 04/03/20 0248 04/04/20 0412 04/05/20 0046 04/06/20 0357  WBC 12.3* 13.6* 17.0* 18.1* 19.8*  HGB 13.9 13.7 14.9 14.3 13.6  HCT 41.1 40.5 44.2 43.0 40.6  PLT 349 317 320 286 224  MCV 86.9 86.2 86.5 87.6 87.3  MCH 29.4 29.1 29.2 29.1 29.2  MCHC 33.8 33.8 33.7 33.3 33.5  RDW 13.8 13.5 13.6 14.0 13.9  LYMPHSABS 0.7 0.6* 0.6* 0.6* 0.7  MONOABS 0.5 0.3 0.5 0.3 0.4  EOSABS 0.0 0.0 0.0 0.0 0.0  BASOSABS 0.0 0.0  0.0 0.0 0.0    Recent Labs  Lab 04/02/20 0328 04/03/20 0248 04/04/20 0412 04/05/20 0046 04/06/20 0357  NA 138 135 139 137 136  K 4.6 4.4 4.3 4.4 4.5  CL 99 98 100 99 100  CO2 27 28 28 25 25   GLUCOSE 145* 128* 177* 194* 139*  BUN 42* 44* 48* 50* 55*  CREATININE 1.59* 1.44* 1.87* 1.79* 1.42*  CALCIUM 11.2* 11.3* 11.4* 11.2* 10.8*  AST 15 17 17 18 18   ALT 21 21 22 23 24   ALKPHOS 54 49 60 50 49  BILITOT 0.6 0.7 1.0 0.8 0.9  ALBUMIN 2.8* 2.8* 3.0* 2.9* 2.6*  CRP 0.6 <0.5 0.5 0.6 0.6  DDIMER <0.27 0.39 <0.27 <0.27 0.36  PROCALCITON <0.10 <0.10 <0.10 <0.10 <0.10  BNP 90.8 76.5 137.7* 124.3* 121.8*       No results found for this or any previous visit (from the past 240 hour(s)).    Radiology Studies: No results found.   LOS: 13 days   Investment banker, corporate on [email or web address]  04/06/2020, 11:05 AM

## 2020-04-06 NOTE — Progress Notes (Signed)
  Echocardiogram 2D Echocardiogram has been performed with [facility].  Amanda Ross 04/06/2020, 11:43 AM

## 2020-04-06 NOTE — Progress Notes (Signed)
   04/06/20 1627  Vitals  Temp 98 F (36.7 C)  Temp Source Tympanic  BP Location Left Arm  BP Method Automatic  Patient Position (if appropriate) Sitting  Pulse Rate (!) 102  Pulse Rate Source Monitor  ECG Heart Rate (!) 102  Resp (!) 22  Level of Consciousness  Level of Consciousness Alert  MEWS COLOR  MEWS Score Color Yellow  Oxygen Therapy  SpO2 92 %  O2 Device HFNC  O2 Flow Rate (L/min) 15 L/min  Patient Activity (if Appropriate) In chair  Pulse Oximetry Type Continuous  Pain Assessment  Pain Scale 0-10  Pain Score 0  PCA/Epidural/Spinal Assessment  Respiratory Pattern Regular;Symmetrical;Dyspnea with exertion  ECG Monitoring  Cardiac Rhythm NSR  Glasgow Coma Scale  Eye Opening 4  Best Verbal Response (NON-intubated) 5  Modified Verbal Response (INTUBATED) 5  Best Motor Response 6  Glasgow Coma Scale Score (!) 20  MEWS Score  MEWS Temp 0  MEWS Systolic 0  MEWS Pulse 1  MEWS RR 1  MEWS LOC 0  MEWS Score 2

## 2020-04-06 NOTE — Progress Notes (Signed)
Physical Therapy Treatment Patient Details Name: Amanda Ross MRN: [id number] DOB: 11-Jun-1946 Today's Date: 04/06/2020    History of Present Illness 74 y.o. female with history of asthma, HTN, OSA, primary hypothyroidism and left thyroid nodule resented with myalgias, chills, intermittent cough) shortness of breath.  She tested positive for COVID-19 at her PCP office.  She apparently desaturated into the mid 80s and was sent over to the emergency department.  Chest x-ray did not show any significant findings.  CT angiogram did not show PE but did show multifocal patchy airspace opacities right greater than left.    PT Comments    Limited in bed session and education secondary to pt reporting fatigue after just returning to bed after being up all day.  Pt on 15L HFNC at rest and participated in breathing and in bed exercises.  Pt encouraged to continue to try to participate with PT as they check in regularly.  PT will continue to follow acutely for safe mobility progression.   Follow Up Recommendations  Home health PT;Supervision/Assistance - 24 hour     Equipment Recommendations  Other (comment) (likely home O2)    Recommendations for Other Services       Precautions / Restrictions Precautions Precaution Comments: watch O2          Cognition Arousal/Alertness: Awake/alert Behavior During Therapy: WFL for tasks assessed/performed Overall Cognitive Status: Within Functional Limits for tasks assessed                                        Exercises Other Exercises Other Exercises: Reviewe and demonstrated correct incentive spirometer and flutter valve use.  Cues for correct technique and frequency.  Other Exercises: Discussed at length the importance of hourly movement (when in the bed ankle pumps and arm raises, when in the chair LAQs and arm raises, inhaling with flexion and exhaling on extension).      General Comments General comments (skin integrity,  edema, etc.): Pt reports just returning to bed after being up in the chair all day.  O2 on 15 L HFNC and sats in the low 90s.  Pt did not feel up for walking, but extensive education on importance of moving for strength and clot prevention as well as regular practice of her breathing exercises is very important.        Pertinent Vitals/Pain Pain Assessment: No/denies pain    Home Living                      Prior Function            PT Goals (current goals can now be found in the care plan section) Progress towards PT goals: Progressing toward goals    Frequency    Min 3X/week      PT Plan Current plan remains appropriate    Co-evaluation              AM-PAC PT "6 Clicks" Mobility   Outcome Measure  Help needed turning from your back to your side while in a flat bed without using bedrails?: A Little Help needed moving from lying on your back to sitting on the side of a flat bed without using bedrails?: A Little Help needed moving to and from a bed to a chair (including a wheelchair)?: A Little Help needed standing up from a chair using your arms (e.g., wheelchair  or bedside chair)?: None Help needed to walk in hospital room?: A Little Help needed climbing 3-5 steps with a railing? : A Little 6 Click Score: 19    End of Session Equipment Utilized During Treatment: Oxygen Activity Tolerance: Patient limited by fatigue Patient left: in bed;with call bell/phone within reach   PT Visit Diagnosis: Other abnormalities of gait and mobility (R26.89);Muscle weakness (generalized) (M62.81)     Time: 1700-1715 PT Time Calculation (min) (ACUTE ONLY): 15 min  Charges:  $Self Care/Home Management: [place], PT, DPT  Acute Rehabilitation [phone] pager [phone]) [phone] office

## 2020-04-07 DIAGNOSIS — U071 COVID-19: Secondary | ICD-10-CM | POA: Diagnosis not present

## 2020-04-07 DIAGNOSIS — J9601 Acute respiratory failure with hypoxia: Secondary | ICD-10-CM | POA: Diagnosis not present

## 2020-04-07 LAB — CBC WITH DIFFERENTIAL/PLATELET
Abs Immature Granulocytes: 0.16 10*3/uL — ABNORMAL HIGH (ref 0.00–0.07)
Basophils Absolute: 0 10*3/uL (ref 0.0–0.1)
Basophils Relative: 0 %
Eosinophils Absolute: 0 10*3/uL (ref 0.0–0.5)
Eosinophils Relative: 0 %
HCT: 42.8 % (ref 36.0–46.0)
Hemoglobin: 14.4 g/dL (ref 12.0–15.0)
Immature Granulocytes: 1 %
Lymphocytes Relative: 5 %
Lymphs Abs: 0.8 10*3/uL (ref 0.7–4.0)
MCH: 29.5 pg (ref 26.0–34.0)
MCHC: 33.6 g/dL (ref 30.0–36.0)
MCV: 87.7 fL (ref 80.0–100.0)
Monocytes Absolute: 0.6 10*3/uL (ref 0.1–1.0)
Monocytes Relative: 4 %
Neutro Abs: 15.9 10*3/uL — ABNORMAL HIGH (ref 1.7–7.7)
Neutrophils Relative %: 90 %
Platelets: 215 10*3/uL (ref 150–400)
RBC: 4.88 MIL/uL (ref 3.87–5.11)
RDW: 14 % (ref 11.5–15.5)
WBC: 17.5 10*3/uL — ABNORMAL HIGH (ref 4.0–10.5)
nRBC: 0 % (ref 0.0–0.2)

## 2020-04-07 LAB — COMPREHENSIVE METABOLIC PANEL
ALT: 28 U/L (ref 0–44)
AST: 18 U/L (ref 15–41)
Albumin: 2.9 g/dL — ABNORMAL LOW (ref 3.5–5.0)
Alkaline Phosphatase: 52 U/L (ref 38–126)
Anion gap: 11 (ref 5–15)
BUN: 49 mg/dL — ABNORMAL HIGH (ref 8–23)
CO2: 29 mmol/L (ref 22–32)
Calcium: 11.3 mg/dL — ABNORMAL HIGH (ref 8.9–10.3)
Chloride: 98 mmol/L (ref 98–111)
Creatinine, Ser: 1.48 mg/dL — ABNORMAL HIGH (ref 0.44–1.00)
GFR, Estimated: 37 mL/min — ABNORMAL LOW (ref >60)
Glucose, Bld: 126 mg/dL — ABNORMAL HIGH (ref 70–99)
Potassium: 4.5 mmol/L (ref 3.5–5.1)
Sodium: 138 mmol/L (ref 135–145)
Total Bilirubin: 1 mg/dL (ref 0.3–1.2)
Total Protein: 5.7 g/dL — ABNORMAL LOW (ref 6.5–8.1)

## 2020-04-07 LAB — PROCALCITONIN: Procalcitonin: <0.1 ng/mL

## 2020-04-07 LAB — BRAIN NATRIURETIC PEPTIDE: B Natriuretic Peptide: 105.3 pg/mL — ABNORMAL HIGH (ref 0.0–100.0)

## 2020-04-07 LAB — D-DIMER, QUANTITATIVE: D-Dimer, Quant: 0.38 ug/mL-FEU (ref 0.00–0.50)

## 2020-04-07 LAB — C-REACTIVE PROTEIN: CRP: 0.5 mg/dL (ref <1.0)

## 2020-04-07 MED ORDER — FUROSEMIDE 10 MG/ML IJ SOLN
40.0000 mg | Freq: Once | INTRAMUSCULAR | Status: AC
  Administered 2020-04-07: 40 mg via INTRAVENOUS
  Filled 2020-04-07: qty 4

## 2020-04-07 NOTE — Consult Note (Signed)
   [facility] CM Inpatient Consult   04/07/2020  SHAREN YOUNGREN 02/09/1946 [id number]   [facility] Follow up note:  [facility] Embedded Team and length of stay review  Chart reviewed for progress for update on post hospital needs remains on High flow oxygen noted.  Plan:  Continue to follow.  Natividad Brood, RN BSN [facility] Liaison  [phone] business mobile phone Toll free office [phone]  Fax number: [phone] [email or web address]

## 2020-04-07 NOTE — Progress Notes (Signed)
PROGRESS NOTE  Amanda Ross XNA:[id number] DOB: 10/19/1945 DOA: 03/24/2020  PCP: [name], MD  Brief History/Interval Summary: 74 y.o. female with history of asthma, HTN, OSA, primary hypothyroidism and left thyroid nodule resented with myalgias, chills, intermittent cough) shortness of breath.  She tested positive for COVID-19 at her PCP office.  She apparently desaturated into the mid 80s and was sent over to the emergency department.  Chest x-ray did not show any significant findings.  CT angiogram did not show PE but did show multifocal patchy airspace opacities right greater than left.   Reason for Visit: Pneumonia due to COVID-19.  Acute respiratory failure with hypoxia.  Consultants: None  Procedures:   CTA - No PE  Leg [place] - No DVT  TTE.  Antibiotics: Anti-infectives (From admission, onward)   Start     Dose/Rate Route Frequency Ordered Stop   03/25/20 1000  remdesivir 100 mg in sodium chloride 0.9 % 100 mL IVPB       "Followed by" Linked Group Details   100 mg 200 mL/hr over 30 Minutes Intravenous Daily 03/24/20 1533 03/28/20 0900   03/24/20 1545  remdesivir 200 mg in sodium chloride 0.9% 250 mL IVPB       "Followed by" Linked Group Details   200 mg 580 mL/hr over 30 Minutes Intravenous Once 03/24/20 1533 03/24/20 1940      Subjective/Interval History:   Patient reports dyspnea at baseline, worsening with activity, she does report cough as well, reports hand and hip pain improved with Voltaren gel.  Ppears comfortable, denies any chest pain, reports dyspnea with activity, reports her chronic arthritis pain in right hip and bilateral hands.   Assessment/Plan:  Acute Hypoxic Resp. Failure/Pneumonia due to COVID-19  - she is fully vaccinated and unfortunately has breakthrough disease, initially responded well to IV steroids and Remdesivir however on 03/29/2020 more hypoxic, she was given Actemra on 03/29/2020. -She remains with significant oxygen  requirement, remains on 15 L via high flow nasal cannula.  She is currently on 15 -On Lasix as needed, will give 40 mg of IV Lasix today. -She was encouraged use incentive spirometry, flutter valve, out of bed to chair.  Encouraged her to sit up in chair in the daytime use I-S and flutter valve for pulmonary toiletry and prone at night.  SpO2: 90 % O2 Flow Rate (L/min): 15 L/min FiO2 (%): 70 %   Recent Labs  Lab 04/03/20 0248 04/04/20 0412 04/05/20 0046 04/06/20 0357 04/07/20 0625  WBC 13.6* 17.0* 18.1* 19.8* 17.5*  HGB 13.7 14.9 14.3 13.6 14.4  HCT 40.5 44.2 43.0 40.6 42.8  PLT 317 320 286 224 215  CRP <0.5 0.5 0.6 0.6 0.5  BNP 76.5 137.7* 124.3* 121.8* 105.3*  DDIMER 0.39 <0.27 <0.27 0.36 0.38  PROCALCITON <0.10 <0.10 <0.10 <0.10 <0.10  AST 17 17 18 18 18   ALT 21 22 23 24 28   ALKPHOS 49 60 50 49 52  BILITOT 0.7 1.0 0.8 0.9 1.0  ALBUMIN 2.8* 3.0* 2.9* 2.6* 2.9*      1 out of 2 staph epidermis positive blood culture.   - Contaminant.     Acute kidney injury on chronic kidney disease stage IIIb  -   her baseline creatinine is around 1.4, AKI caused by acute infection and dehydration, after diuresis on 04/03/2020 -Monitor closely as she is on IV diuresis  Obstructive sleep apnea  - Continue with oxygen.  History of asthma  - Steroids and inhalers as above.  Essential hypertension  - on low dose B Blocker, added IV hydralazine as needed.    History of overactive bladder  - Monitor. Continue oxybutynin.  Mild Thrombocytopenia  - Due to viral infection. Resolved.  Morbid obesity  - BMI  Of > 39 Follow with PCP for weight loss.   DVT Prophylaxis: Lovenox Code Status: Full code Family Communication: Discussed with patient.  Called her husband Amanda Ross  # [phone] and updated 04/05/2020 .  Left voicemail 04/07/2020  Disposition Plan: Hopefully return home when improved  Status is: Inpatient  Remains inpatient appropriate because:IV treatments appropriate  due to intensity of illness or inability to take PO and Inpatient level of care appropriate due to severity of illness   Dispo: The patient is from: Home              Anticipated d/c is to: Home              Anticipated d/c date is: 2 days              Patient currently is not medically stable to d/c.   Medications:  Scheduled: . (feeding supplement) PROSource Plus  30 mL Oral BID BM  . vitamin C  500 mg Oral Daily  . aspirin EC  81 mg Oral Daily  . cholecalciferol  2,000 Units Oral Daily  . diclofenac Sodium  2 g Topical BID  . enoxaparin (LOVENOX) injection  40 mg Subcutaneous Q12H  . feeding supplement  1 Container Oral TID BM  . Ipratropium-Albuterol  1 puff Inhalation TID  . methylPREDNISolone (SOLU-MEDROL) injection  60 mg Intravenous Q12H  . metoprolol tartrate  12.5 mg Oral BID  . oxybutynin  10 mg Oral QHS  . pantoprazole  40 mg Oral Daily  . traZODone  50 mg Oral QHS  . zinc sulfate  220 mg Oral Daily   Continuous:  XTG:GYIRSWNIOEVOJ, baclofen, bisacodyl, chlorpheniramine-HYDROcodone, guaiFENesin-dextromethorphan, hydrALAZINE, hydroxypropyl methylcellulose / hypromellose, Ipratropium-Albuterol, magnesium citrate, [DISCONTINUED] ondansetron **OR** ondansetron (ZOFRAN) IV, polyethylene glycol, simethicone, sodium chloride, zolpidem   Objective:  Vital Signs  Vitals:   04/07/20 0405 04/07/20 0829 04/07/20 0859 04/07/20 1149  BP: 116/81 122/74  (!) 130/94  Pulse: 91 89  89  Resp: 19 19  20   Temp: 98.5 F (36.9 C) 98.8 F (37.1 C)  97.8 F (36.6 C)  TempSrc: Oral Oral  Axillary  SpO2: 93% (!) 88% (!) 88% 90%  Weight:      Height:        Intake/Output Summary (Last 24 hours) at 04/07/2020 1532 Last data filed at 04/07/2020 0900 Gross per 24 hour  Intake 240 ml  Output --  Net 240 ml   Filed Weights   03/24/20 1033  Weight: 102.1 kg    EXAM  Awake Alert, Oriented X 3, No new F.N deficits, Normal affect Symmetrical Chest wall movement, Good air  movement bilaterally, scattered rales RRR,No Gallops,Rubs or new Murmurs, No Parasternal Heave +ve B.Sounds, Abd Soft, No tenderness, No rebound - guarding or rigidity. No Cyanosis, Clubbing or edema, No new Rash or bruise      Lab Results:  Data Reviewed: I have personally reviewed following labs and imaging studies  Recent Labs  Lab 04/03/20 0248 04/04/20 0412 04/05/20 0046 04/06/20 0357 04/07/20 0625  WBC 13.6* 17.0* 18.1* 19.8* 17.5*  HGB 13.7 14.9 14.3 13.6 14.4  HCT 40.5 44.2 43.0 40.6 42.8  PLT 317 320 286 224 215  MCV 86.2 86.5 87.6 87.3 87.7  MCH 29.1  29.2 29.1 29.2 29.5  MCHC 33.8 33.7 33.3 33.5 33.6  RDW 13.5 13.6 14.0 13.9 14.0  LYMPHSABS 0.6* 0.6* 0.6* 0.7 0.8  MONOABS 0.3 0.5 0.3 0.4 0.6  EOSABS 0.0 0.0 0.0 0.0 0.0  BASOSABS 0.0 0.0 0.0 0.0 0.0    Recent Labs  Lab 04/03/20 0248 04/04/20 0412 04/05/20 0046 04/06/20 0357 04/07/20 0625  NA 135 139 137 136 138  K 4.4 4.3 4.4 4.5 4.5  CL 98 100 99 100 98  CO2 28 28 25 25 29   GLUCOSE 128* 177* 194* 139* 126*  BUN 44* 48* 50* 55* 49*  CREATININE 1.44* 1.87* 1.79* 1.42* 1.48*  CALCIUM 11.3* 11.4* 11.2* 10.8* 11.3*  AST 17 17 18 18 18   ALT 21 22 23 24 28   ALKPHOS 49 60 50 49 52  BILITOT 0.7 1.0 0.8 0.9 1.0  ALBUMIN 2.8* 3.0* 2.9* 2.6* 2.9*  CRP <0.5 0.5 0.6 0.6 0.5  DDIMER 0.39 <0.27 <0.27 0.36 0.38  PROCALCITON <0.10 <0.10 <0.10 <0.10 <0.10  BNP 76.5 137.7* 124.3* 121.8* 105.3*       No results found for this or any previous visit (from the past 240 hour(s)).    Radiology Studies: ECHOCARDIOGRAM LIMITED  Result Date: 04/06/2020    ECHOCARDIOGRAM LIMITED REPORT   Patient Name:   Amanda Ross Date of Exam: 04/06/2020 Medical Rec #:  [id number]         Height:       63.0 in Accession #:    [id number]        Weight:       225.0 lb Date of Birth:  1945-08-06         BSA:          2.033 m Patient Age:    53 years          BP:           111/75 mmHg Patient Gender: F                 HR:            96 bpm. Exam Location:  Inpatient Procedure: Limited Echo, Cardiac Doppler, Color Doppler and Intracardiac            Opacification Agent Indications:    Dyspnea  History:        Patient has no prior history of Echocardiogram examinations.                 Risk Factors:Dyslipidemia and Former Smoker. COVID-19.  Sonographer:    Clayton Lefort RDCS (AE) Referring Phys: [id number] Corky Blumstein Graciela Husbands  Sonographer Comments: Technically challenging study due to limited acoustic windows, Technically difficult study due to poor echo windows, suboptimal parasternal window, suboptimal apical window, suboptimal subcostal window and patient is morbidly obese.  COVID-19. IMPRESSIONS  1. Left ventricular ejection fraction, by estimation, is 60 to 65%. The left ventricle has normal function.  2. Right ventricular systolic function was not well visualized. The right ventricular size is moderately enlarged.  3. The mitral valve was not well visualized.  4. The aortic valve was not well visualized.  5. The inferior vena cava is normal in size with <50% respiratory variability, suggesting right atrial pressure of 8 mmHg. Comparison(s): No prior Echocardiogram. Conclusion(s)/Recommendation(s): Technically challenging study with limited windows. Even with use of echo contrast, able to assess normal LVEF but limited for wall motion analysis. No significant valve disease by doppler, but not well visualized. FINDINGS  Left Ventricle:  Left ventricular ejection fraction, by estimation, is 60 to 65%. The left ventricle has normal function. Definity contrast agent was given IV to delineate the left ventricular endocardial borders. Right Ventricle: The right ventricular size is moderately enlarged. Right vetricular wall thickness was not well visualized. Right ventricular systolic function was not well visualized. Left Atrium: Left atrial size was not well visualized. Right Atrium: Right atrial size was not well visualized. Pericardium: The pericardium  was not well visualized. Mitral Valve: The mitral valve was not well visualized. Tricuspid Valve: The tricuspid valve is not well visualized. Aortic Valve: The aortic valve was not well visualized. Pulmonic Valve: The pulmonic valve was not well visualized. Aorta: The aortic root was not well visualized, the ascending aorta was not well visualized and the aortic arch was not well visualized. Venous: The inferior vena cava is normal in size with less than 50% respiratory variability, suggesting right atrial pressure of 8 mmHg. LEFT VENTRICLE PLAX 2D LVOT diam:     2.10 cm  Diastology LVOT Area:     3.46 cm LV e' medial:    3.37 cm/s                         LV E/e' medial:  11.1                         LV e' lateral:   4.46 cm/s                         LV E/e' lateral: 8.4  IVC IVC diam: 1.70 cm  AORTA Ao Root diam: 3.30 cm Ao Asc diam:  3.30 cm MITRAL VALVE MV Area (PHT): 3.68 cm    SHUNTS MV Decel Time: 206 msec    Systemic Diam: 2.10 cm MV E velocity: 37.30 cm/s MV A velocity: 72.40 cm/s MV E/A ratio:  0.52 Buford Dresser MD Electronically signed by Buford Dresser MD Signature Date/Time: 04/06/2020/2:33:32 PM    Final      LOS: 14 days   Dimonique Bourdeau  [facility] Pager on [email or web address]  04/07/2020, 3:32 PM

## 2020-04-08 DIAGNOSIS — J9601 Acute respiratory failure with hypoxia: Secondary | ICD-10-CM | POA: Diagnosis not present

## 2020-04-08 DIAGNOSIS — U071 COVID-19: Secondary | ICD-10-CM | POA: Diagnosis not present

## 2020-04-08 LAB — BASIC METABOLIC PANEL
Anion gap: 9 (ref 5–15)
BUN: 51 mg/dL — ABNORMAL HIGH (ref 8–23)
CO2: 29 mmol/L (ref 22–32)
Calcium: 10.9 mg/dL — ABNORMAL HIGH (ref 8.9–10.3)
Chloride: 99 mmol/L (ref 98–111)
Creatinine, Ser: 1.48 mg/dL — ABNORMAL HIGH (ref 0.44–1.00)
GFR, Estimated: 37 mL/min — ABNORMAL LOW (ref >60)
Glucose, Bld: 123 mg/dL — ABNORMAL HIGH (ref 70–99)
Potassium: 4.2 mmol/L (ref 3.5–5.1)
Sodium: 137 mmol/L (ref 135–145)

## 2020-04-08 MED ORDER — FUROSEMIDE 10 MG/ML IJ SOLN
40.0000 mg | Freq: Once | INTRAMUSCULAR | Status: AC
  Administered 2020-04-08: 40 mg via INTRAVENOUS
  Filled 2020-04-08: qty 4

## 2020-04-08 NOTE — Progress Notes (Signed)
PROGRESS NOTE  Amanda Ross EUM:[id number] DOB: 16-Oct-1945 DOA: 03/24/2020  PCP: [name], MD  Brief History/Interval Summary: 74 y.o. female with history of asthma, HTN, OSA, primary hypothyroidism and left thyroid nodule resented with myalgias, chills, intermittent cough) shortness of breath.  She tested positive for COVID-19 at her PCP office.  She apparently desaturated into the mid 80s and was sent over to the emergency department.  Chest x-ray did not show any significant findings.  CT angiogram did not show PE but did show multifocal patchy airspace opacities right greater than left.   Reason for Visit: Pneumonia due to COVID-19.  Acute respiratory failure with hypoxia.  Consultants: None  Procedures:   CTA - No PE  Leg [place] - No DVT  TTE.  Antibiotics: Anti-infectives (From admission, onward)   Start     Dose/Rate Route Frequency Ordered Stop   03/25/20 1000  remdesivir 100 mg in sodium chloride 0.9 % 100 mL IVPB       "Followed by" Linked Group Details   100 mg 200 mL/hr over 30 Minutes Intravenous Daily 03/24/20 1533 03/28/20 0900   03/24/20 1545  remdesivir 200 mg in sodium chloride 0.9% 250 mL IVPB       "Followed by" Linked Group Details   200 mg 580 mL/hr over 30 Minutes Intravenous Once 03/24/20 1533 03/24/20 1940      Subjective/Interval History:   Patient reports dyspnea at baseline, worsening with activity, she does report cough as well, reports hand and hip pain improved with Voltaren gel.  Ppears comfortable, denies any chest pain, reports dyspnea with activity, reports her chronic arthritis pain in right hip and bilateral hands.   Assessment/Plan:  Acute Hypoxic Resp. Failure/Pneumonia due to COVID-19  - she is fully vaccinated and unfortunately has breakthrough disease, initially responded well to IV steroids and Remdesivir however on 03/29/2020 more hypoxic. -  she was given Actemra on 03/29/2020. -Continue with IV steroids. -She remains  with significant oxygen requirement, remains on 15 L via high flow nasal cannula.  She is currently on 15 -On Lasix as needed, she will be given another 40 mg of IV Lasix today. -She was encouraged use incentive spirometry, flutter valve, out of bed to chair.  Encouraged her to sit up in chair in the daytime use I-S and flutter valve for pulmonary toiletry and prone at night.  SpO2: 94 % O2 Flow Rate (L/min): 15 L/min FiO2 (%): 70 %   Recent Labs  Lab 04/03/20 0248 04/04/20 0412 04/05/20 0046 04/06/20 0357 04/07/20 0625  WBC 13.6* 17.0* 18.1* 19.8* 17.5*  HGB 13.7 14.9 14.3 13.6 14.4  HCT 40.5 44.2 43.0 40.6 42.8  PLT 317 320 286 224 215  CRP <0.5 0.5 0.6 0.6 0.5  BNP 76.5 137.7* 124.3* 121.8* 105.3*  DDIMER 0.39 <0.27 <0.27 0.36 0.38  PROCALCITON <0.10 <0.10 <0.10 <0.10 <0.10  AST 17 17 18 18 18   ALT 21 22 23 24 28   ALKPHOS 49 60 50 49 52  BILITOT 0.7 1.0 0.8 0.9 1.0  ALBUMIN 2.8* 3.0* 2.9* 2.6* 2.9*      1 out of 2 staph epidermis positive blood culture.   - Contaminant.     Acute kidney injury on chronic kidney disease stage IIIb  -   her baseline creatinine is around 1.4, AKI caused by acute infection and dehydration, after diuresis on 04/03/2020 -Monitor closely as she is on IV diuresis  Obstructive sleep apnea  - Continue with oxygen.  History of  asthma  - Steroids and inhalers as above.  Essential hypertension  - on low dose B Blocker, added IV hydralazine as needed.    History of overactive bladder  - Monitor. Continue oxybutynin.  Mild Thrombocytopenia  - Due to viral infection. Resolved.  Morbid obesity  - BMI  Of > 39 Follow with PCP for weight loss.   DVT Prophylaxis: Lovenox Code Status: Full code Family Communication: Discussed with patient.  Called her husband [name]  # [phone] and updated 04/05/2020 . And 04/07/2020  Disposition Plan: Hopefully return home when improved  Status is: Inpatient  Remains inpatient appropriate  because:IV treatments appropriate due to intensity of illness or inability to take PO and Inpatient level of care appropriate due to severity of illness   Dispo: The patient is from: Home              Anticipated d/c is to: Home              Anticipated d/c date is: 2 days              Patient currently is not medically stable to d/c.   Medications:  Scheduled: . (feeding supplement) PROSource Plus  30 mL Oral BID BM  . vitamin C  500 mg Oral Daily  . aspirin EC  81 mg Oral Daily  . cholecalciferol  2,000 Units Oral Daily  . diclofenac Sodium  2 g Topical BID  . enoxaparin (LOVENOX) injection  40 mg Subcutaneous Q12H  . feeding supplement  1 Container Oral TID BM  . Ipratropium-Albuterol  1 puff Inhalation TID  . methylPREDNISolone (SOLU-MEDROL) injection  60 mg Intravenous Q12H  . metoprolol tartrate  12.5 mg Oral BID  . oxybutynin  10 mg Oral QHS  . pantoprazole  40 mg Oral Daily  . traZODone  50 mg Oral QHS  . zinc sulfate  220 mg Oral Daily   Continuous:  IRW:[id number], baclofen, bisacodyl, chlorpheniramine-HYDROcodone, guaiFENesin-dextromethorphan, hydrALAZINE, hydroxypropyl methylcellulose / hypromellose, Ipratropium-Albuterol, magnesium citrate, [DISCONTINUED] ondansetron **OR** ondansetron (ZOFRAN) IV, polyethylene glycol, simethicone, sodium chloride, zolpidem   Objective:  Vital Signs  Vitals:   04/08/20 0200 04/08/20 0433 04/08/20 0722 04/08/20 1335  BP:  110/77 113/77 126/75  Pulse: 78 83 81 93  Resp:  20 19 20   Temp:  98.1 F (36.7 C) 97.7 F (36.5 C) 98.2 F (36.8 C)  TempSrc:  Oral Oral Oral  SpO2:  96% 97% 94%  Weight:      Height:        Intake/Output Summary (Last 24 hours) at 04/08/2020 1510 Last data filed at 04/08/2020 1334 Gross per 24 hour  Intake 960 ml  Output --  Net 960 ml   Filed Weights   03/24/20 1033  Weight: 102.1 kg    EXAM  Awake Alert, Oriented X 3, No new F.N deficits, Normal affect Symmetrical Chest wall  movement, Good air movement bilaterally, scattered rales RRR,No Gallops,Rubs or new Murmurs, No Parasternal Heave +ve B.Sounds, Abd Soft, No tenderness, No rebound - guarding or rigidity. No Cyanosis, Clubbing or edema, No new Rash or bruise      Lab Results:  Data Reviewed: I have personally reviewed following labs and imaging studies  Recent Labs  Lab 04/03/20 0248 04/04/20 0412 04/05/20 0046 04/06/20 0357 04/07/20 0625  WBC 13.6* 17.0* 18.1* 19.8* 17.5*  HGB 13.7 14.9 14.3 13.6 14.4  HCT 40.5 44.2 43.0 40.6 42.8  PLT 317 320 286 224 215  MCV 86.2 86.5 87.6 87.3  87.7  MCH 29.1 29.2 29.1 29.2 29.5  MCHC 33.8 33.7 33.3 33.5 33.6  RDW 13.5 13.6 14.0 13.9 14.0  LYMPHSABS 0.6* 0.6* 0.6* 0.7 0.8  MONOABS 0.3 0.5 0.3 0.4 0.6  EOSABS 0.0 0.0 0.0 0.0 0.0  BASOSABS 0.0 0.0 0.0 0.0 0.0    Recent Labs  Lab 04/03/20 0248 04/03/20 0248 04/04/20 0412 04/05/20 0046 04/06/20 0357 04/07/20 0625 04/08/20 0237  NA 135   < > 139 137 136 138 137  K 4.4   < > 4.3 4.4 4.5 4.5 4.2  CL 98   < > 100 99 100 98 99  CO2 28   < > 28 25 25 29 29   GLUCOSE 128*   < > 177* 194* 139* 126* 123*  BUN 44*   < > 48* 50* 55* 49* 51*  CREATININE 1.44*   < > 1.87* 1.79* 1.42* 1.48* 1.48*  CALCIUM 11.3*   < > 11.4* 11.2* 10.8* 11.3* 10.9*  AST 17  --  17 18 18 18   --   ALT 21  --  22 23 24 28   --   ALKPHOS 49  --  60 50 49 52  --   BILITOT 0.7  --  1.0 0.8 0.9 1.0  --   ALBUMIN 2.8*  --  3.0* 2.9* 2.6* 2.9*  --   CRP <0.5  --  0.5 0.6 0.6 0.5  --   DDIMER 0.39  --  <0.27 <0.27 0.36 0.38  --   PROCALCITON <0.10  --  <0.10 <0.10 <0.10 <0.10  --   BNP 76.5  --  137.7* 124.3* 121.8* 105.3*  --    < > = values in this interval not displayed.       No results found for this or any previous visit (from the past 240 hour(s)).    Radiology Studies: No results found.   LOS: 15 days   Investment banker, corporate on [email or web address]  04/08/2020, 3:10 PM

## 2020-04-08 NOTE — Plan of Care (Signed)
  Problem: Clinical Measurements: Goal: Will remain free from infection Outcome: Progressing Goal: Cardiovascular complication will be avoided Outcome: Progressing   Problem: Coping: Goal: Psychosocial and spiritual needs will be supported Outcome: Progressing   Problem: Respiratory: Goal: Will maintain a patent airway Outcome: Progressing Goal: Complications related to the disease process, condition or treatment will be avoided or minimized Outcome: Progressing

## 2020-04-08 NOTE — Progress Notes (Signed)
Physical Therapy Treatment Patient Details Name: Amanda Ross MRN: [id number] DOB: 23-Sep-1945 Today's Date: 04/08/2020    History of Present Illness 74 y.o. female with history of asthma, HTN, OSA, primary hypothyroidism and left thyroid nodule resented with myalgias, chills, intermittent cough) shortness of breath.  She tested positive for COVID-19 at her PCP office.  She apparently desaturated into the mid 80s and was sent over to the emergency department.  Chest x-ray did not show any significant findings.  CT angiogram did not show PE but did show multifocal patchy airspace opacities right greater than left.    PT Comments    Pt making good progress today with improved tolerance for activity.  She participated in multiple transfers and 2 short bouts of ambulation back/forth at EOB.  Pt required min cues for safety and pursed lip breathing with supervision for lines/leads.  Continue to advance as able.     Follow Up Recommendations  Home health PT;Supervision/Assistance - 24 hour     Equipment Recommendations  Other (comment) (likely home O2)    Recommendations for Other Services       Precautions / Restrictions Precautions Precautions: Fall Precaution Comments: watch O2    Mobility  Bed Mobility               General bed mobility comments: sitting in the chair on arrival  Transfers   Equipment used: None Transfers: Sit to/from Stand Sit to Stand: Modified independent (Device/Increase time) Stand pivot transfers: Supervision       General transfer comment: No assist needed for sit to stand transfers, supervision for lines/leads; sit to stand x 5 thorughout session.  Transferred to [facility], had BM and was able to perform ADLs.  Transferred back to chair and reports needs to have another BM.  Transferred back to [facility] and left with needs in reach and nurse tech notified  Ambulation/Gait Ambulation/Gait assistance: Supervision Gait Distance (Feet): 20 Feet (20'  then 30') Assistive device: None Gait Pattern/deviations: Step-through pattern Gait velocity: decarsed   General Gait Details: Steps back and forth at EOB, limited due to on 15 L HFNC and fatigue, required seated rest breaks and cues for pursed lip deep breathing   Stairs             Wheelchair Mobility    Modified Rankin (Stroke Patients Only)       Balance Overall balance assessment: Needs assistance Sitting-balance support: No upper extremity supported;Feet supported Sitting balance-Leahy Scale: Good     Standing balance support: No upper extremity supported;During functional activity Standing balance-Leahy Scale: Good                              Cognition Arousal/Alertness: Awake/alert Behavior During Therapy: WFL for tasks assessed/performed Overall Cognitive Status: Within Functional Limits for tasks assessed                                        Exercises      General Comments General comments (skin integrity, edema, etc.): Pt on 15 L HFNC with sats low 90's at rest and down to 84% with ambulation requiring rest and 2-3 mins to recover.      Pertinent Vitals/Pain Pain Assessment: No/denies pain    Home Living  Prior Function            PT Goals (current goals can now be found in the care plan section) Acute Rehab PT Goals Patient Stated Goal: go home soon PT Goal Formulation: With patient Time For Goal Achievement: 04/10/20 Potential to Achieve Goals: Good Progress towards PT goals: Progressing toward goals    Frequency    Min 3X/week      PT Plan Current plan remains appropriate    Co-evaluation              AM-PAC PT "6 Clicks" Mobility   Outcome Measure  Help needed turning from your back to your side while in a flat bed without using bedrails?: A Little Help needed moving from lying on your back to sitting on the side of a flat bed without using bedrails?: A  Little Help needed moving to and from a bed to a chair (including a wheelchair)?: A Little Help needed standing up from a chair using your arms (e.g., wheelchair or bedside chair)?: None Help needed to walk in hospital room?: None Help needed climbing 3-5 steps with a railing? : A Little 6 Click Score: 20    End of Session Equipment Utilized During Treatment: Oxygen Activity Tolerance: Patient tolerated treatment well Patient left: in bed;with call bell/phone within reach Nurse Communication: Mobility status PT Visit Diagnosis: Other abnormalities of gait and mobility (R26.89);Muscle weakness (generalized) (M62.81)     Time: 3646-8032 PT Time Calculation (min) (ACUTE ONLY): 28 min  Charges:  $Gait Training: 8-22 mins $Therapeutic Activity: 8-22 mins                     Abran Richard, PT Acute Rehab Services Pager [phone] [facility] 04/08/2020, 12:43 PM

## 2020-04-09 DIAGNOSIS — U071 COVID-19: Secondary | ICD-10-CM | POA: Diagnosis not present

## 2020-04-09 DIAGNOSIS — J9601 Acute respiratory failure with hypoxia: Secondary | ICD-10-CM | POA: Diagnosis not present

## 2020-04-09 LAB — BASIC METABOLIC PANEL
Anion gap: 10 (ref 5–15)
BUN: 52 mg/dL — ABNORMAL HIGH (ref 8–23)
CO2: 28 mmol/L (ref 22–32)
Calcium: 11.2 mg/dL — ABNORMAL HIGH (ref 8.9–10.3)
Chloride: 98 mmol/L (ref 98–111)
Creatinine, Ser: 1.57 mg/dL — ABNORMAL HIGH (ref 0.44–1.00)
GFR, Estimated: 34 mL/min — ABNORMAL LOW (ref >60)
Glucose, Bld: 157 mg/dL — ABNORMAL HIGH (ref 70–99)
Potassium: 4.1 mmol/L (ref 3.5–5.1)
Sodium: 136 mmol/L (ref 135–145)

## 2020-04-09 MED ORDER — FUROSEMIDE 10 MG/ML IJ SOLN
40.0000 mg | Freq: Once | INTRAMUSCULAR | Status: AC
  Administered 2020-04-09: 40 mg via INTRAVENOUS
  Filled 2020-04-09: qty 4

## 2020-04-09 NOTE — Progress Notes (Signed)
PROGRESS NOTE  Amanda Ross NWG:[id number] DOB: Oct 24, 1945 DOA: 03/24/2020  PCP: [name], MD  Brief History/Interval Summary: 74 y.o. female with history of asthma, HTN, OSA, primary hypothyroidism and left thyroid nodule resented with myalgias, chills, intermittent cough) shortness of breath.  She tested positive for COVID-19 at her PCP office.  She apparently desaturated into the mid 80s and was sent over to the emergency department.  Chest x-ray did not show any significant findings.  CT angiogram did not show PE but did show multifocal patchy airspace opacities right greater than left.   Reason for Visit: Pneumonia due to COVID-19.  Acute respiratory failure with hypoxia.  Consultants: None  Procedures:   CTA - No PE  Leg [place] - No DVT  TTE.  Antibiotics: Anti-infectives (From admission, onward)   Start     Dose/Rate Route Frequency Ordered Stop   03/25/20 1000  remdesivir 100 mg in sodium chloride 0.9 % 100 mL IVPB       "Followed by" Linked Group Details   100 mg 200 mL/hr over 30 Minutes Intravenous Daily 03/24/20 1533 03/28/20 0900   03/24/20 1545  remdesivir 200 mg in sodium chloride 0.9% 250 mL IVPB       "Followed by" Linked Group Details   200 mg 580 mL/hr over 30 Minutes Intravenous Once 03/24/20 1533 03/24/20 1940      Subjective/Interval History:   Patient reports dyspnea has improved, she is feeling better today.  Assessment/Plan:  Acute Hypoxic Resp. Failure/Pneumonia due to COVID-19  - she is fully vaccinated and unfortunately has breakthrough disease, initially responded well to IV steroids and Remdesivir however on 03/29/2020 more hypoxic. -  she was given Actemra on 03/29/2020. -Continue with IV steroids. -He has improved oxygen requirement overnight, she is currently on 5 L high high flow nasal cannula. -On Lasix as needed, will give another dose today. -She was encouraged use incentive spirometry, flutter valve, out of bed to chair.    Encouraged her to sit up in chair in the daytime use I-S and flutter valve for pulmonary toiletry and prone at night.  SpO2: 93 % O2 Flow Rate (L/min): 5 L/min FiO2 (%): 70 %   Recent Labs  Lab 04/03/20 0248 04/04/20 0412 04/05/20 0046 04/06/20 0357 04/07/20 0625  WBC 13.6* 17.0* 18.1* 19.8* 17.5*  HGB 13.7 14.9 14.3 13.6 14.4  HCT 40.5 44.2 43.0 40.6 42.8  PLT 317 320 286 224 215  CRP <0.5 0.5 0.6 0.6 0.5  BNP 76.5 137.7* 124.3* 121.8* 105.3*  DDIMER 0.39 <0.27 <0.27 0.36 0.38  PROCALCITON <0.10 <0.10 <0.10 <0.10 <0.10  AST 17 17 18 18 18   ALT 21 22 23 24 28   ALKPHOS 49 60 50 49 52  BILITOT 0.7 1.0 0.8 0.9 1.0  ALBUMIN 2.8* 3.0* 2.9* 2.6* 2.9*      1 out of 2 staph epidermis positive blood culture.   - Contaminant.     Acute kidney injury on chronic kidney disease stage IIIb  -   her baseline creatinine is around 1.4, AKI caused by acute infection and dehydration, after diuresis on 04/03/2020 -Monitor closely as she is on IV diuresis  Obstructive sleep apnea  - Continue with oxygen.  History of asthma  - Steroids and inhalers as above.  Essential hypertension  - on low dose B Blocker, added IV hydralazine as needed.    History of overactive bladder  - Monitor. Continue oxybutynin.  Mild Thrombocytopenia  - Due to viral infection. Resolved.  Morbid obesity  - BMI  Of > 39 Follow with PCP for weight loss.   DVT Prophylaxis: Lovenox Code Status: Full code Family Communication: Discussed with patient.  Called her husband [name]  # [phone] and updated 04/05/2020 . And 04/07/2020  Disposition Plan: Hopefully return home when improved  Status is: Inpatient  Remains inpatient appropriate because:IV treatments appropriate due to intensity of illness or inability to take PO and Inpatient level of care appropriate due to severity of illness   Dispo: The patient is from: Home              Anticipated d/c is to: Home              Anticipated d/c date is:  2 days              Patient currently is not medically stable to d/c.   Medications:  Scheduled: . (feeding supplement) PROSource Plus  30 mL Oral BID BM  . vitamin C  500 mg Oral Daily  . aspirin EC  81 mg Oral Daily  . cholecalciferol  2,000 Units Oral Daily  . diclofenac Sodium  2 g Topical BID  . enoxaparin (LOVENOX) injection  40 mg Subcutaneous Q12H  . feeding supplement  1 Container Oral TID BM  . Ipratropium-Albuterol  1 puff Inhalation TID  . methylPREDNISolone (SOLU-MEDROL) injection  60 mg Intravenous Q12H  . metoprolol tartrate  12.5 mg Oral BID  . oxybutynin  10 mg Oral QHS  . pantoprazole  40 mg Oral Daily  . traZODone  50 mg Oral QHS  . zinc sulfate  220 mg Oral Daily   Continuous:  KCL:[id number], baclofen, bisacodyl, chlorpheniramine-HYDROcodone, guaiFENesin-dextromethorphan, hydrALAZINE, hydroxypropyl methylcellulose / hypromellose, Ipratropium-Albuterol, magnesium citrate, [DISCONTINUED] ondansetron **OR** ondansetron (ZOFRAN) IV, polyethylene glycol, simethicone, sodium chloride, zolpidem   Objective:  Vital Signs  Vitals:   04/08/20 1335 04/09/20 0435 04/09/20 0713 04/09/20 1445  BP: 126/75 107/77 134/75 109/76  Pulse: 93 84 94 (!) 104  Resp: 20 18 20 20   Temp: 98.2 F (36.8 C) 98 F (36.7 C) 98 F (36.7 C) 97.6 F (36.4 C)  TempSrc: Oral Oral Oral   SpO2: 94% 100% 98% 93%  Weight:      Height:        Intake/Output Summary (Last 24 hours) at 04/09/2020 1448 Last data filed at 04/09/2020 0900 Gross per 24 hour  Intake 480 ml  Output --  Net 480 ml   Filed Weights   03/24/20 1033  Weight: 102.1 kg    EXAM  Awake Alert, Oriented X 3, No new F.N deficits, Normal affect Symmetrical Chest wall movement, Good air movement bilaterally, scattered rales RRR,No Gallops,Rubs or new Murmurs, No Parasternal Heave +ve B.Sounds, Abd Soft, No tenderness, No rebound - guarding or rigidity. No Cyanosis, Clubbing or edema, No new Rash or bruise      Lab Results:  Data Reviewed: I have personally reviewed following labs and imaging studies  Recent Labs  Lab 04/03/20 0248 04/04/20 0412 04/05/20 0046 04/06/20 0357 04/07/20 0625  WBC 13.6* 17.0* 18.1* 19.8* 17.5*  HGB 13.7 14.9 14.3 13.6 14.4  HCT 40.5 44.2 43.0 40.6 42.8  PLT 317 320 286 224 215  MCV 86.2 86.5 87.6 87.3 87.7  MCH 29.1 29.2 29.1 29.2 29.5  MCHC 33.8 33.7 33.3 33.5 33.6  RDW 13.5 13.6 14.0 13.9 14.0  LYMPHSABS 0.6* 0.6* 0.6* 0.7 0.8  MONOABS 0.3 0.5 0.3 0.4 0.6  EOSABS 0.0 0.0 0.0 0.0 0.0  BASOSABS 0.0 0.0 0.0 0.0 0.0    Recent Labs  Lab 04/03/20 0248 04/03/20 0248 04/04/20 0412 04/04/20 0412 04/05/20 0046 04/06/20 0357 04/07/20 0625 04/08/20 0237 04/09/20 0347  NA 135   < > 139   < > 137 136 138 137 136  K 4.4   < > 4.3   < > 4.4 4.5 4.5 4.2 4.1  CL 98   < > 100   < > 99 100 98 99 98  CO2 28   < > 28   < > 25 25 29 29 28   GLUCOSE 128*   < > 177*   < > 194* 139* 126* 123* 157*  BUN 44*   < > 48*   < > 50* 55* 49* 51* 52*  CREATININE 1.44*   < > 1.87*   < > 1.79* 1.42* 1.48* 1.48* 1.57*  CALCIUM 11.3*   < > 11.4*   < > 11.2* 10.8* 11.3* 10.9* 11.2*  AST 17  --  17  --  18 18 18   --   --   ALT 21  --  22  --  23 24 28   --   --   ALKPHOS 49  --  60  --  50 49 52  --   --   BILITOT 0.7  --  1.0  --  0.8 0.9 1.0  --   --   ALBUMIN 2.8*  --  3.0*  --  2.9* 2.6* 2.9*  --   --   CRP <0.5  --  0.5  --  0.6 0.6 0.5  --   --   DDIMER 0.39  --  <0.27  --  <0.27 0.36 0.38  --   --   PROCALCITON <0.10  --  <0.10  --  <0.10 <0.10 <0.10  --   --   BNP 76.5  --  137.7*  --  124.3* 121.8* 105.3*  --   --    < > = values in this interval not displayed.       No results found for this or any previous visit (from the past 240 hour(s)).    Radiology Studies: No results found.   LOS: 16 days   Investment banker, corporate on [email or web address]  04/09/2020, 2:48 PM

## 2020-04-10 DIAGNOSIS — U071 COVID-19: Secondary | ICD-10-CM | POA: Diagnosis not present

## 2020-04-10 DIAGNOSIS — J9601 Acute respiratory failure with hypoxia: Secondary | ICD-10-CM | POA: Diagnosis not present

## 2020-04-10 LAB — BASIC METABOLIC PANEL
Anion gap: 10 (ref 5–15)
BUN: 53 mg/dL — ABNORMAL HIGH (ref 8–23)
CO2: 29 mmol/L (ref 22–32)
Calcium: 10.8 mg/dL — ABNORMAL HIGH (ref 8.9–10.3)
Chloride: 99 mmol/L (ref 98–111)
Creatinine, Ser: 1.39 mg/dL — ABNORMAL HIGH (ref 0.44–1.00)
GFR, Estimated: 40 mL/min — ABNORMAL LOW (ref >60)
Glucose, Bld: 146 mg/dL — ABNORMAL HIGH (ref 70–99)
Potassium: 4.1 mmol/L (ref 3.5–5.1)
Sodium: 138 mmol/L (ref 135–145)

## 2020-04-10 LAB — CBC
HCT: 39.4 % (ref 36.0–46.0)
Hemoglobin: 13.5 g/dL (ref 12.0–15.0)
MCH: 29.9 pg (ref 26.0–34.0)
MCHC: 34.3 g/dL (ref 30.0–36.0)
MCV: 87.4 fL (ref 80.0–100.0)
Platelets: 174 10*3/uL (ref 150–400)
RBC: 4.51 MIL/uL (ref 3.87–5.11)
RDW: 14.4 % (ref 11.5–15.5)
WBC: 14.5 10*3/uL — ABNORMAL HIGH (ref 4.0–10.5)
nRBC: 0 % (ref 0.0–0.2)

## 2020-04-10 MED ORDER — ORAL CARE MOUTH RINSE
15.0000 mL | Freq: Two times a day (BID) | OROMUCOSAL | Status: DC
  Administered 2020-04-10 – 2020-04-16 (×12): 15 mL via OROMUCOSAL

## 2020-04-10 MED ORDER — IPRATROPIUM-ALBUTEROL 20-100 MCG/ACT IN AERS
1.0000 | INHALATION_SPRAY | Freq: Two times a day (BID) | RESPIRATORY_TRACT | Status: DC
  Administered 2020-04-10 – 2020-04-11 (×4): 1 via RESPIRATORY_TRACT

## 2020-04-10 NOTE — Progress Notes (Signed)
Pt ambulated in room, to door and back to recliner. Pt had to sit down twice during ambulation d/t SOB & chest tightness. Symptoms resolved upon rest. While walking O2 sats dropped into 70s and 80s on 6L [place], however hard to tell how accurate this was d/t monitor not picking up accurately. Pt got back in recliner, 5L [place] 85%. After a few min came up to 93%. MD aware.     04/10/20 1500  Mobility  Activity Ambulated in room  Range of Motion/Exercises Active;All extremities  Level of Assistance Modified independent, requires aide device or extra time  Assistive Device Front wheel walker  Distance Ambulated (ft) 30 ft  Mobility Response Tolerated fair  Mobility performed by Nurse

## 2020-04-10 NOTE — Progress Notes (Signed)
Patient tolerating O2 3L [place]. Patient denies SOB at rest. No distress noted. Patient assisted from bed to [facility], then back to bed. SpO2 noted as low as 79% on 3L, but increased to 86% within a minute of patient being back in bed. SpO2 continues to increase with patient resting. Patient resting in bed at this time, no complaints. Will continue to monitor.

## 2020-04-10 NOTE — Progress Notes (Signed)
PROGRESS NOTE  Amanda Ross ZJI:[id number] DOB: [birth year] DOA: 03/24/2020  PCP: [name], MD  Brief History/Interval Summary: 74 y.o. female with history of asthma, HTN, OSA, primary hypothyroidism and left thyroid nodule resented with myalgias, chills, intermittent cough) shortness of breath.  She tested positive for COVID-19 at her PCP office.  She apparently desaturated into the mid 80s and was sent over to the emergency department.  Chest x-ray did not show any significant findings.  CT angiogram did not show PE but did show multifocal patchy airspace opacities right greater than left.   Reason for Visit: Pneumonia due to COVID-19.  Acute respiratory failure with hypoxia.  Consultants: None  Procedures:   CTA - No PE  Leg [place] - No DVT  TTE.  Antibiotics: Anti-infectives (From admission, onward)   Start     Dose/Rate Route Frequency Ordered Stop   03/25/20 1000  remdesivir 100 mg in sodium chloride 0.9 % 100 mL IVPB       "Followed by" Linked Group Details   100 mg 200 mL/hr over 30 Minutes Intravenous Daily 03/24/20 1533 03/28/20 0900   03/24/20 1545  remdesivir 200 mg in sodium chloride 0.9% 250 mL IVPB       "Followed by" Linked Group Details   200 mg 580 mL/hr over 30 Minutes Intravenous Once 03/24/20 1533 03/24/20 1940      Subjective/Interval History:   Patient reports dyspnea and cough is better today, denies nausea or vomiting  Assessment/Plan:  Acute Hypoxic Resp. Failure/Pneumonia due to COVID-19  - she is fully vaccinated and unfortunately has breakthrough disease, initially responded well to IV steroids and Remdesivir however on 03/29/2020 more hypoxic. -  she was given Actemra on 03/29/2020. -Continue with IV steroids. -He has improved oxygen requirement overnight, he is on 3 L nasal cannula today. -On Lasix as needed, will hold on giving any further Lasix today given her soft blood pressure earlier today. -She was encouraged use incentive  spirometry, flutter valve, out of bed to chair.  Encouraged her to sit up in chair in the daytime use I-S and flutter valve for pulmonary toiletry and prone at night.  SpO2: 90 % O2 Flow Rate (L/min): 3 L/min FiO2 (%): 70 %   Recent Labs  Lab 04/04/20 0412 04/05/20 0046 04/06/20 0357 04/07/20 0625 04/10/20 0430  WBC 17.0* 18.1* 19.8* 17.5* 14.5*  HGB 14.9 14.3 13.6 14.4 13.5  HCT 44.2 43.0 40.6 42.8 39.4  PLT 320 286 224 215 174  CRP 0.5 0.6 0.6 0.5  --   BNP 137.7* 124.3* 121.8* 105.3*  --   DDIMER <0.27 <0.27 0.36 0.38  --   PROCALCITON <0.10 <0.10 <0.10 <0.10  --   AST 17 18 18 18   --   ALT 22 23 24 28   --   ALKPHOS 60 50 49 52  --   BILITOT 1.0 0.8 0.9 1.0  --   ALBUMIN 3.0* 2.9* 2.6* 2.9*  --       1 out of 2 staph epidermis positive blood culture.   - Contaminant.     Acute kidney injury on chronic kidney disease stage IIIb  -   her baseline creatinine is around 1.4, AKI caused by acute infection and dehydration, after diuresis on 04/03/2020 -Monitor closely as she is on IV diuresis  Obstructive sleep apnea  - Continue with oxygen.  History of asthma  - Steroids and inhalers as above.  Essential hypertension  - on low dose B Blocker, added  IV hydralazine as needed.    History of overactive bladder  - Monitor. Continue oxybutynin.  Mild Thrombocytopenia  - Due to viral infection. Resolved.  Morbid obesity  - BMI  Of > 39 Follow with PCP for weight loss.   DVT Prophylaxis: Lovenox Code Status: Full code Family Communication: Discussed with patient.  Called her husband [name]  # [phone] and updated 04/05/2020 . And 04/07/2020,10/24.  Disposition Plan: Hopefully return home when improved  Status is: Inpatient  Remains inpatient appropriate because:IV treatments appropriate due to intensity of illness or inability to take PO and Inpatient level of care appropriate due to severity of illness   Dispo: The patient is from: Home               Anticipated d/c is to: Home              Anticipated d/c date is: 2 days              Patient currently is not medically stable to d/c.   Medications:  Scheduled: . (feeding supplement) PROSource Plus  30 mL Oral BID BM  . vitamin C  500 mg Oral Daily  . aspirin EC  81 mg Oral Daily  . cholecalciferol  2,000 Units Oral Daily  . diclofenac Sodium  2 g Topical BID  . enoxaparin (LOVENOX) injection  40 mg Subcutaneous Q12H  . feeding supplement  1 Container Oral TID BM  . Ipratropium-Albuterol  1 puff Inhalation BID  . mouth rinse  15 mL Mouth Rinse BID  . methylPREDNISolone (SOLU-MEDROL) injection  60 mg Intravenous Q12H  . metoprolol tartrate  12.5 mg Oral BID  . oxybutynin  10 mg Oral QHS  . pantoprazole  40 mg Oral Daily  . traZODone  50 mg Oral QHS  . zinc sulfate  220 mg Oral Daily   Continuous:  TJQ:[id number], baclofen, bisacodyl, chlorpheniramine-HYDROcodone, guaiFENesin-dextromethorphan, hydrALAZINE, hydroxypropyl methylcellulose / hypromellose, Ipratropium-Albuterol, magnesium citrate, [DISCONTINUED] ondansetron **OR** ondansetron (ZOFRAN) IV, polyethylene glycol, simethicone, sodium chloride, zolpidem   Objective:  Vital Signs  Vitals:   04/09/20 2121 04/09/20 2300 04/10/20 0433 04/10/20 0732  BP:   96/69 120/80  Pulse: (!) 105  85 100  Resp:   20 20  Temp:   97.8 F (36.6 C) 98.7 F (37.1 C)  TempSrc:   Oral Oral  SpO2: 98% 99% 95% 90%  Weight:      Height:        Intake/Output Summary (Last 24 hours) at 04/10/2020 1237 Last data filed at 04/10/2020 0914 Gross per 24 hour  Intake 540 ml  Output --  Net 540 ml   Filed Weights   03/24/20 1033  Weight: 102.1 kg    EXAM  Awake Alert, Oriented X 3, No new F.N deficits, Normal affect Symmetrical Chest wall movement, Good air movement bilaterally, scattered rales RRR,No Gallops,Rubs or new Murmurs, No Parasternal Heave +ve B.Sounds, Abd Soft, No tenderness, No rebound - guarding or rigidity. No  Cyanosis, Clubbing ,trace  edema, No new Rash or bruise      Lab Results:  Data Reviewed: I have personally reviewed following labs and imaging studies  Recent Labs  Lab 04/04/20 0412 04/05/20 0046 04/06/20 0357 04/07/20 0625 04/10/20 0430  WBC 17.0* 18.1* 19.8* 17.5* 14.5*  HGB 14.9 14.3 13.6 14.4 13.5  HCT 44.2 43.0 40.6 42.8 39.4  PLT 320 286 224 215 174  MCV 86.5 87.6 87.3 87.7 87.4  MCH 29.2 29.1 29.2 29.5 29.9  MCHC  33.7 33.3 33.5 33.6 34.3  RDW 13.6 14.0 13.9 14.0 14.4  LYMPHSABS 0.6* 0.6* 0.7 0.8  --   MONOABS 0.5 0.3 0.4 0.6  --   EOSABS 0.0 0.0 0.0 0.0  --   BASOSABS 0.0 0.0 0.0 0.0  --     Recent Labs  Lab 04/04/20 0412 04/04/20 0412 04/05/20 0046 04/05/20 0046 04/06/20 0357 04/07/20 0625 04/08/20 0237 04/09/20 0347 04/10/20 0430  NA 139   < > 137   < > 136 138 137 136 138  K 4.3   < > 4.4   < > 4.5 4.5 4.2 4.1 4.1  CL 100   < > 99   < > 100 98 99 98 99  CO2 28   < > 25   < > 25 29 29 28 29   GLUCOSE 177*   < > 194*   < > 139* 126* 123* 157* 146*  BUN 48*   < > 50*   < > 55* 49* 51* 52* 53*  CREATININE 1.87*   < > 1.79*   < > 1.42* 1.48* 1.48* 1.57* 1.39*  CALCIUM 11.4*   < > 11.2*   < > 10.8* 11.3* 10.9* 11.2* 10.8*  AST 17  --  18  --  18 18  --   --   --   ALT 22  --  23  --  24 28  --   --   --   ALKPHOS 60  --  50  --  49 52  --   --   --   BILITOT 1.0  --  0.8  --  0.9 1.0  --   --   --   ALBUMIN 3.0*  --  2.9*  --  2.6* 2.9*  --   --   --   CRP 0.5  --  0.6  --  0.6 0.5  --   --   --   DDIMER <0.27  --  <0.27  --  0.36 0.38  --   --   --   PROCALCITON <0.10  --  <0.10  --  <0.10 <0.10  --   --   --   BNP 137.7*  --  124.3*  --  121.8* 105.3*  --   --   --    < > = values in this interval not displayed.       No results found for this or any previous visit (from the past 240 hour(s)).    Radiology Studies: No results found.   LOS: 17 days   Investment banker, corporate on [email or web address]  04/10/2020, 12:37 PM

## 2020-04-11 ENCOUNTER — Telehealth: Payer: Medicare Other

## 2020-04-11 DIAGNOSIS — J9601 Acute respiratory failure with hypoxia: Secondary | ICD-10-CM | POA: Diagnosis not present

## 2020-04-11 DIAGNOSIS — U071 COVID-19: Secondary | ICD-10-CM | POA: Diagnosis not present

## 2020-04-11 LAB — TROPONIN I (HIGH SENSITIVITY)
Troponin I (High Sensitivity): 181 ng/L (ref <18)
Troponin I (High Sensitivity): 186 ng/L (ref <18)

## 2020-04-11 LAB — CBC
HCT: 38 % (ref 36.0–46.0)
Hemoglobin: 13 g/dL (ref 12.0–15.0)
MCH: 30.1 pg (ref 26.0–34.0)
MCHC: 34.2 g/dL (ref 30.0–36.0)
MCV: 88 fL (ref 80.0–100.0)
Platelets: 162 10*3/uL (ref 150–400)
RBC: 4.32 MIL/uL (ref 3.87–5.11)
RDW: 14.6 % (ref 11.5–15.5)
WBC: 15.6 10*3/uL — ABNORMAL HIGH (ref 4.0–10.5)
nRBC: 0 % (ref 0.0–0.2)

## 2020-04-11 LAB — BASIC METABOLIC PANEL
Anion gap: 7 (ref 5–15)
BUN: 58 mg/dL — ABNORMAL HIGH (ref 8–23)
CO2: 28 mmol/L (ref 22–32)
Calcium: 10.9 mg/dL — ABNORMAL HIGH (ref 8.9–10.3)
Chloride: 103 mmol/L (ref 98–111)
Creatinine, Ser: 1.39 mg/dL — ABNORMAL HIGH (ref 0.44–1.00)
GFR, Estimated: 40 mL/min — ABNORMAL LOW (ref >60)
Glucose, Bld: 138 mg/dL — ABNORMAL HIGH (ref 70–99)
Potassium: 4 mmol/L (ref 3.5–5.1)
Sodium: 138 mmol/L (ref 135–145)

## 2020-04-11 LAB — MAGNESIUM: Magnesium: 2.3 mg/dL (ref 1.7–2.4)

## 2020-04-11 MED ORDER — ATORVASTATIN CALCIUM 10 MG PO TABS
10.0000 mg | ORAL_TABLET | Freq: Every day | ORAL | Status: DC
  Administered 2020-04-11 – 2020-04-16 (×6): 10 mg via ORAL
  Filled 2020-04-11 (×6): qty 1

## 2020-04-11 MED ORDER — NITROGLYCERIN 0.4 MG SL SUBL
SUBLINGUAL_TABLET | SUBLINGUAL | Status: AC
  Administered 2020-04-11: 0.4 mg
  Filled 2020-04-11: qty 1

## 2020-04-11 MED ORDER — NITROGLYCERIN 0.4 MG SL SUBL: 0.4000 mg | SUBLINGUAL_TABLET | SUBLINGUAL | Status: DC | PRN

## 2020-04-11 MED ORDER — FUROSEMIDE 10 MG/ML IJ SOLN
20.0000 mg | Freq: Once | INTRAMUSCULAR | Status: AC
  Administered 2020-04-11: 20 mg via INTRAVENOUS
  Filled 2020-04-11: qty 2

## 2020-04-11 MED ORDER — HEPARIN (PORCINE) 25000 UT/250ML-% IV SOLN
900.0000 [IU]/h | INTRAVENOUS | Status: DC
  Administered 2020-04-11: 900 [IU]/h via INTRAVENOUS
  Filled 2020-04-11: qty 250

## 2020-04-11 MED ORDER — PREDNISONE 20 MG PO TABS
20.0000 mg | ORAL_TABLET | Freq: Every day | ORAL | Status: DC
  Administered 2020-04-12: 20 mg via ORAL
  Filled 2020-04-11: qty 1

## 2020-04-11 MED ORDER — ALUM & MAG HYDROXIDE-SIMETH 200-200-20 MG/5ML PO SUSP
30.0000 mL | Freq: Once | ORAL | Status: AC
  Administered 2020-04-11: 30 mL via ORAL
  Filled 2020-04-11: qty 30

## 2020-04-11 NOTE — Progress Notes (Signed)
ANTICOAGULATION CONSULT NOTE - Initial Consult  Pharmacy Consult for IV Heparin Indication: chest pain/ACS  Allergies  Allergen Reactions  . Ivp Dye [Iodinated Diagnostic Agents] Hives  . Iohexol   . Penicillins Hives    Did it involve swelling of the face/tongue/throat, SOB, or low BP? N Did it involve sudden or severe rash/hives, skin peeling, or any reaction on the inside of your mouth or nose? Y Did you need to seek medical attention at a hospital or doctor's office? N When did it last happen?Several Years Ago If all above answers are "NO", may proceed with cephalosporin use.   Marland Kitchen Percocet [Oxycodone-Acetaminophen]   . Prednisone Nausea Only    Patient Measurements: Height: 5\' 3"  (160 cm) Weight: 96.1 kg (211 lb 13.8 oz) IBW/kg (Calculated) : 52.4 Heparin Dosing Weight: 75 kg  Vital Signs: Temp: 98.6 F (37 C) (10/25 1422) Temp Source: Oral (10/25 1422) BP: 92/57 (10/25 1422) Pulse Rate: 85 (10/25 1422)  Labs: Recent Labs    04/09/20 0347 04/10/20 0430 04/11/20 0159 04/11/20 1428  HGB  --  13.5 13.0  --   HCT  --  39.4 38.0  --   PLT  --  174 162  --   CREATININE 1.57* 1.39* 1.39*  --   TROPONINIHS  --   --   --  181*    Estimated Creatinine Clearance: 40.5 mL/min (A) (by C-G formula based on SCr of 1.39 mg/dL (H)).   Medical History: Past Medical History:  Diagnosis Date  . Asthma   . GERD (gastroesophageal reflux disease)   . H/O bladder infections   . H/O measles   . H/O varicella   . Headache(784.0)    Frequently  . Hypertension   . OSA (obstructive sleep apnea)   . Osteoarthritis   . Yeast infection     Assessment: 74 yr old female admitted on 03/24/20 with acute respiratory failure due to COVID; d-dimer 0.44 on admission, 0.38 on 04/07/20. Pt has been receiving enoxaparin 40 mg SQ BID for VTE prophylaxis (last dose at 4917 AM today). Pharmacy is consulted to dose IV heparin (no bolus, per MD) for chest pain and elevated troponins until  ACS can be ruled out.  H/H, platelets WNL   Goal of Therapy:  Heparin level 0.3-0.7 units/ml Monitor platelets by anticoagulation protocol: Yes   Plan:  Start heparin infusion at 900 units/hr (no bolus) Check heparin level in ~7 hrs Monitor daily heparin level, CBC Monitor for signs/symptoms of bleeding F/U cardiology evaluation plans  Gillermina Hu, PharmD, BCPS, [facility] Clinical Pharmacist 04/11/2020,3:57 PM

## 2020-04-11 NOTE — Progress Notes (Signed)
CRITICAL VALUE ALERT  Critical Value:  Pt c/o CP pointed to Epigastric area stated pain and pressure pushing up to mid-sternal area while reclining in her recliner chair, rated pain 10/10 and 7/10 when assisted to a sit up position in her chair. 12 Leads EKG performed, O2 increased, Nitro x1 which dropped her BP down to 85/69, Troponin level STAT.  1200 - BP 101/71 HR 75 O 2 100% 5 liters via [place]. Pt in chair resting comfortably rated CP 5/10. Dr. Landis Gandy called and present in patient's room and assessment and orders throughout this incident.    Date & Time Notied:  04/11/2020 1141  Provider Notified: Dr. Landis Gandy  Orders Received/Actions taken: Orders received and initiated.

## 2020-04-11 NOTE — Progress Notes (Addendum)
PT Cancellation Note  Patient Details Name: Amanda Ross MRN: [id number] DOB: 06/09/1946   Cancelled Treatment:    Reason Eval/Treat Not Completed: Pain limiting ability to participate;Medical issues which prohibited therapy. Pt in recliner with c/o 7/10 chest pain. RN and MD in room. Preparing for EKG. Will hold PT today and follow up tomorrow.   Lorriane Shire 04/11/2020, 11:43 AM   Lorrin Goodell, PT  Office # [phone] Pager [phone]

## 2020-04-11 NOTE — Progress Notes (Addendum)
CRITICAL VALUE ALERT  Critical Value:  Troponin level 181. Pt denies chest discomfort.  Date & Time Notied:  04/11/2020 1523  Provider Notified: Dr. Waldron Labs  Orders Received/Actions taken: aware, next check in 2 hrs. See orders.

## 2020-04-11 NOTE — Progress Notes (Addendum)
PROGRESS NOTE  Amanda Ross:[id number] DOB: [birth year] DOA: 03/24/2020  PCP: [name], MD  Brief History/Interval Summary: 74 y.o. female with history of asthma, HTN, OSA, primary hypothyroidism and left thyroid nodule resented with myalgias, chills, intermittent cough) shortness of breath.  She tested positive for COVID-19 at her PCP office.  She apparently desaturated into the mid 80s and was sent over to the emergency department.  Chest x-ray did not show any significant findings.  CT angiogram did not show PE but did show multifocal patchy airspace opacities right greater than left.   Reason for Visit: Pneumonia due to COVID-19.  Acute respiratory failure with hypoxia.  Consultants: None  Procedures:   CTA - No PE  Leg [place] - No DVT  TTE.  Antibiotics: Anti-infectives (From admission, onward)   Start     Dose/Rate Route Frequency Ordered Stop   03/25/20 1000  remdesivir 100 mg in sodium chloride 0.9 % 100 mL IVPB       "Followed by" Linked Group Details   100 mg 200 mL/hr over 30 Minutes Intravenous Daily 03/24/20 1533 03/28/20 0900   03/24/20 1545  remdesivir 200 mg in sodium chloride 0.9% 250 mL IVPB       "Followed by" Linked Group Details   200 mg 580 mL/hr over 30 Minutes Intravenous Once 03/24/20 1533 03/24/20 1940      Subjective/Interval History:   Dyspnea at baseline, she does report some weakness today, she had some epigastric/Chest  pain in the late morning.  Does report some anxiety as well.  Assessment/Plan:  Acute Hypoxic Resp. Failure/Pneumonia due to COVID-19  - she is fully vaccinated and unfortunately has breakthrough disease, initially responded well to IV steroids and Remdesivir however on 03/29/2020 more hypoxic. -  she was given Actemra on 03/29/2020. -Treated with IV steroids, will transition to oral prednisone today to taper over the next 48 to 72 hours. -He has improved oxygen requirement overnight, remains on 5 L nasal cannula  today, she remains significantly hypoxic with increased work of breathing with minimal activity in the room, yesterday she dropped down to 7 L on 6 L oxygen with minimal ambulation in the room. -On Lasix as needed,. -She was encouraged use incentive spirometry, flutter valve, out of bed to chair.  Encouraged her to sit up in chair in the daytime use I-S and flutter valve for pulmonary toiletry and prone at night.  SpO2: 100 % O2 Flow Rate (L/min): 5 L/min FiO2 (%): 70 %   Recent Labs  Lab 04/05/20 0046 04/06/20 0357 04/07/20 0625 04/10/20 0430 04/11/20 0159  WBC 18.1* 19.8* 17.5* 14.5* 15.6*  HGB 14.3 13.6 14.4 13.5 13.0  HCT 43.0 40.6 42.8 39.4 38.0  PLT 286 224 215 174 162  CRP 0.6 0.6 0.5  --   --   BNP 124.3* 121.8* 105.3*  --   --   DDIMER <0.27 0.36 0.38  --   --   PROCALCITON <0.10 <0.10 <0.10  --   --   AST 18 18 18   --   --   ALT 23 24 28   --   --   ALKPHOS 50 49 52  --   --   BILITOT 0.8 0.9 1.0  --   --   ALBUMIN 2.9* 2.6* 2.9*  --   --      Chest pain/epigastric pain -Late morning patient had complaints of epigastric/chest pain, initial troponin is elevated at 181, chest pain resolved with nitro, discussed with  cardiology Dr. Doylene Canard, will start on heparin GTT empirically pending repeat of her cardiac enzymes, he will evaluate further work-up , she is already on aspirin and beta-blockers .  1 out of 2 staph epidermis positive blood culture.   - Contaminant.     Acute kidney injury on chronic kidney disease stage IIIb  -   her baseline creatinine is around 1.4, AKI caused by acute infection and dehydration, after diuresis on 04/03/2020 -Monitor closely as she is on IV diuresis  Obstructive sleep apnea  - Continue with oxygen.  History of asthma  - Steroids and inhalers as above.  Essential hypertension  - on low dose B Blocker, added IV hydralazine as needed.    History of overactive bladder  - Monitor. Continue oxybutynin.  Mild Thrombocytopenia  -  Due to viral infection. Resolved.  Morbid obesity  - BMI  Of > 39 Follow with PCP for weight loss.   Anxiety -Start on BuSpar  DVT Prophylaxis: Lovenox Code Status: Full code Family Communication: Discussed with patient.  Called her husband [name]  # [phone] and updated 04/05/2020 . And 04/07/2020,10/24.  Disposition Plan: Hopefully return home when improved  Status is: Inpatient  Remains inpatient appropriate because:IV treatments appropriate due to intensity of illness or inability to take PO and Inpatient level of care appropriate due to severity of illness   Dispo: The patient is from: Home              Anticipated d/c is to: Home              Anticipated d/c date is: 2 days              Patient currently is not medically stable to d/c.   Medications:  Scheduled: . (feeding supplement) PROSource Plus  30 mL Oral BID BM  . vitamin C  500 mg Oral Daily  . aspirin EC  81 mg Oral Daily  . cholecalciferol  2,000 Units Oral Daily  . diclofenac Sodium  2 g Topical BID  . enoxaparin (LOVENOX) injection  40 mg Subcutaneous Q12H  . feeding supplement  1 Container Oral TID BM  . Ipratropium-Albuterol  1 puff Inhalation BID  . mouth rinse  15 mL Mouth Rinse BID  . methylPREDNISolone (SOLU-MEDROL) injection  60 mg Intravenous Q12H  . metoprolol tartrate  12.5 mg Oral BID  . oxybutynin  10 mg Oral QHS  . pantoprazole  40 mg Oral Daily  . traZODone  50 mg Oral QHS  . zinc sulfate  220 mg Oral Daily   Continuous:  MGQ:[id number], baclofen, bisacodyl, chlorpheniramine-HYDROcodone, guaiFENesin-dextromethorphan, hydrALAZINE, hydroxypropyl methylcellulose / hypromellose, Ipratropium-Albuterol, magnesium citrate, nitroGLYCERIN, [DISCONTINUED] ondansetron **OR** ondansetron (ZOFRAN) IV, polyethylene glycol, simethicone, sodium chloride, zolpidem   Objective:  Vital Signs  Vitals:   04/10/20 2353 04/11/20 0523 04/11/20 0715 04/11/20 0932  BP:  97/60  105/62  Pulse:  88  89   Resp: 19 16    Temp:  98.2 F (36.8 C)    TempSrc:  Oral    SpO2:  93% 100%   Weight:      Height:        Intake/Output Summary (Last 24 hours) at 04/11/2020 1422 Last data filed at 04/11/2020 0900 Gross per 24 hour  Intake 480 ml  Output --  Net 480 ml   Filed Weights   03/24/20 1033  Weight: 102.1 kg    EXAM  Awake Alert, Oriented X 3, No new F.N deficits, Normal affect Symmetrical  Chest wall movement, Good air movement bilaterally, scattered rales RRR,No Gallops,Rubs or new Murmurs, No Parasternal Heave +ve B.Sounds, Abd Soft, No tenderness, No rebound - guarding or rigidity. No Cyanosis, Clubbing ,trace  edema, No new Rash or bruise      Lab Results:  Data Reviewed: I have personally reviewed following labs and imaging studies  Recent Labs  Lab 04/05/20 0046 04/06/20 0357 04/07/20 0625 04/10/20 0430 04/11/20 0159  WBC 18.1* 19.8* 17.5* 14.5* 15.6*  HGB 14.3 13.6 14.4 13.5 13.0  HCT 43.0 40.6 42.8 39.4 38.0  PLT 286 224 215 174 162  MCV 87.6 87.3 87.7 87.4 88.0  MCH 29.1 29.2 29.5 29.9 30.1  MCHC 33.3 33.5 33.6 34.3 34.2  RDW 14.0 13.9 14.0 14.4 14.6  LYMPHSABS 0.6* 0.7 0.8  --   --   MONOABS 0.3 0.4 0.6  --   --   EOSABS 0.0 0.0 0.0  --   --   BASOSABS 0.0 0.0 0.0  --   --     Recent Labs  Lab 04/05/20 0046 04/05/20 0046 04/06/20 0357 04/06/20 0357 04/07/20 0625 04/08/20 0237 04/09/20 0347 04/10/20 0430 04/11/20 0159  NA 137   < > 136   < > 138 137 136 138 138  K 4.4   < > 4.5   < > 4.5 4.2 4.1 4.1 4.0  CL 99   < > 100   < > 98 99 98 99 103  CO2 25   < > 25   < > 29 29 28 29 28   GLUCOSE 194*   < > 139*   < > 126* 123* 157* 146* 138*  BUN 50*   < > 55*   < > 49* 51* 52* 53* 58*  CREATININE 1.79*   < > 1.42*   < > 1.48* 1.48* 1.57* 1.39* 1.39*  CALCIUM 11.2*   < > 10.8*   < > 11.3* 10.9* 11.2* 10.8* 10.9*  AST 18  --  18  --  18  --   --   --   --   ALT 23  --  24  --  28  --   --   --   --   ALKPHOS 50  --  49  --  52  --   --   --    --   BILITOT 0.8  --  0.9  --  1.0  --   --   --   --   ALBUMIN 2.9*  --  2.6*  --  2.9*  --   --   --   --   MG  --   --   --   --   --   --   --   --  2.3  CRP 0.6  --  0.6  --  0.5  --   --   --   --   DDIMER <0.27  --  0.36  --  0.38  --   --   --   --   PROCALCITON <0.10  --  <0.10  --  <0.10  --   --   --   --   BNP 124.3*  --  121.8*  --  105.3*  --   --   --   --    < > = values in this interval not displayed.       No results found for this or any previous visit (from the past 240  hour(s)).    Radiology Studies: No results found.   LOS: 18 days   Investment banker, corporate on [email or web address]  04/11/2020, 2:22 PM

## 2020-04-11 NOTE — Progress Notes (Signed)
OT Cancellation Note  Patient Details Name: Amanda Ross MRN: [id number] DOB: 01/10/1946   Cancelled Treatment:    Reason Eval/Treat Not Completed: Medical issues which prohibited therapy. Pt with chest pain and planning for EKG. MD request hold therapy for today. Will return as schedule allows and pt medically stable. Thank you.   [place], OTR/L Acute Rehab Pager: [phone] Office: [phone] 04/11/2020, 1:11 PM

## 2020-04-11 NOTE — Consult Note (Addendum)
Referring Physician: Phillips Climes, MD  Amanda Ross is an 74 y.o. female.                       Chief Complaint: Atypical Chest pain  HPI: 74 years old female with PMH of asthma, HTN, OSA, primary hypothyroidism and left thyroid nodule has COVID-19 pneumonia since 03/24/2020. She had epigastric discomfort with no EKG changes of ischemia or infarction. Her HS-troponin I levels are slightly up at 181 and 185 ng. SL NTG gave her hypotension. She is currently chest pain free. 1 month ago her lipid panel is near normal with LDL cholsterol of 84 mg; HDL cholesterol of 54 mg and total cholesterol of 161 mg. without statin use. Her LFT is near normal. Her creatinine is 1.39 mg.  Past Medical History:  Diagnosis Date  . Asthma   . GERD (gastroesophageal reflux disease)   . H/O bladder infections   . H/O measles   . H/O varicella   . Headache(784.0)    Frequently  . Hypertension   . OSA (obstructive sleep apnea)   . Osteoarthritis   . Yeast infection       Past Surgical History:  Procedure Laterality Date  . BUNIONECTOMY    . CARPAL TUNNEL RELEASE    . dental work  08/2019  . TOTAL SHOULDER ARTHROPLASTY      Family History  Problem Relation Age of Onset  . Asthma Daughter   . Heart disease Mother        CHF  . Deep vein thrombosis Mother   . Rheum arthritis Mother   . Heart disease Brother   . Rheum arthritis Sister    Social History:  reports that she quit smoking about 14 years ago. Her smoking use included cigarettes. She has a 40.00 pack-year smoking history. She has never used smokeless tobacco. She reports previous alcohol use. She reports that she does not use drugs.  Allergies:  Allergies  Allergen Reactions  . Ivp Dye [Iodinated Diagnostic Agents] Hives  . Iohexol   . Penicillins Hives    Did it involve swelling of the face/tongue/throat, SOB, or low BP? N Did it involve sudden or severe rash/hives, skin peeling, or any reaction on the inside of your mouth  or nose? Y Did you need to seek medical attention at a hospital or doctor's office? N When did it last happen?Several Years Ago If all above answers are "NO", may proceed with cephalosporin use.   Marland Kitchen Percocet [Oxycodone-Acetaminophen]   . Prednisone Nausea Only    Medications Prior to Admission  Medication Sig Dispense Refill  . albuterol (VENTOLIN HFA) 108 (90 Base) MCG/ACT inhaler Inhale 1-2 puffs into the lungs every 6 (six) hours as needed for wheezing or shortness of breath. 1 each 0  . aspirin 81 MG tablet Take 81 mg by mouth daily.    Marland Kitchen azithromycin (ZITHROMAX) 250 MG tablet Take 1 tablet (250 mg total) by mouth daily. Take first 2 tablets together, then 1 every day until finished. 6 tablet 0  . baclofen (LIORESAL) 10 MG tablet TAKE 1/2 TABLET BY MOUTH TWICE DAILY AS NEEDED (Patient taking differently: Take 10-15 mg by mouth 2 (two) times daily as needed for muscle spasms. ) 30 tablet 0  . Cholecalciferol (VITAMIN D) 1000 UNITS capsule Take 2,000 Units by mouth daily.     . diclofenac Sodium (VOLTAREN) 1 % GEL Apply 2 g to 4 g to affected area up to 4 times  daily as needed. (Patient taking differently: Apply 2 g topically 4 (four) times daily as needed (pain). ) 400 g 0  . hydroxychloroquine (PLAQUENIL) 200 MG tablet Take 1 tablet (200 mg total) by mouth every morning. 90 tablet 0  . losartan-hydrochlorothiazide (HYZAAR) 50-12.5 MG tablet TAKE 1 TABLET BY MOUTH DAILY 90 tablet 1  . magnesium gluconate (MAGONATE) 500 MG tablet Take 500 mg by mouth daily.     . metoprolol tartrate (LOPRESSOR) 25 MG tablet TAKE 1/2 TABLET BY MOUTH TWICE DAILY 90 tablet 1  . omeprazole (PRILOSEC) 20 MG capsule TAKE 2 CAPSULES(40 MG) BY MOUTH DAILY (Patient taking differently: Take 40 mg by mouth daily. ) 90 capsule 1  . oxybutynin (DITROPAN-XL) 10 MG 24 hr tablet Take 10 mg by mouth at bedtime.    . Potassium 99 MG TABS Take 1 tablet by mouth daily.    . traZODone (DESYREL) 50 MG tablet TAKE 1 TABLET  BY MOUTH EVERY NIGHT AT BEDTIME 90 tablet 0  . guaiFENesin (ROBITUSSIN) 100 MG/5ML liquid Take 5-10 mLs (100-200 mg total) by mouth every 4 (four) hours as needed for cough. 60 mL 0  . ipratropium (ATROVENT) 0.03 % nasal spray Place 2 sprays into both nostrils as needed. (Patient not taking: Reported on 03/02/2020) 30 mL 2  . mirabegron ER (MYRBETRIQ) 25 MG TB24 tablet Take 1 tablet (25 mg total) by mouth daily. (Patient not taking: Reported on 03/02/2020) 1 tablet 0    Results for orders placed or performed during the hospital encounter of 03/24/20 (from the past 48 hour(s))  Basic metabolic panel     Status: Abnormal   Collection Time: 04/10/20  4:30 AM  Result Value Ref Range   Sodium 138 135 - 145 mmol/L   Potassium 4.1 3.5 - 5.1 mmol/L   Chloride 99 98 - 111 mmol/L   CO2 29 22 - 32 mmol/L   Glucose, Bld 146 (H) 70 - 99 mg/dL    Comment: Glucose reference range applies only to samples taken after fasting for at least 8 hours.   BUN 53 (H) 8 - 23 mg/dL   Creatinine, Ser 1.39 (H) 0.44 - 1.00 mg/dL   Calcium 10.8 (H) 8.9 - 10.3 mg/dL   GFR, Estimated 40 (L) >60 mL/min    Comment: (NOTE) Calculated using the CKD-EPI Creatinine Equation (2021)    Anion gap 10 5 - 15    Comment: Performed at [facility] [address], [place], [place] [address]  CBC     Status: Abnormal   Collection Time: 04/10/20  4:30 AM  Result Value Ref Range   WBC 14.5 (H) 4.0 - 10.5 K/uL   RBC 4.51 3.87 - 5.11 MIL/uL   Hemoglobin 13.5 12.0 - 15.0 g/dL   HCT 39.4 36 - 46 %   MCV 87.4 80.0 - 100.0 fL   MCH 29.9 26.0 - 34.0 pg   MCHC 34.3 30.0 - 36.0 g/dL   RDW 14.4 11.5 - 15.5 %   Platelets 174 150 - 400 K/uL   nRBC 0.0 0.0 - 0.2 %    Comment: Performed at [facility], [place] [address], [place] [address]  CBC     Status: Abnormal   Collection Time: 04/11/20  1:59 AM  Result Value Ref Range   WBC 15.6 (H) 4.0 - 10.5 K/uL   RBC 4.32 3.87 - 5.11 MIL/uL   Hemoglobin 13.0 12.0 - 15.0 g/dL    HCT 38.0 36 - 46 %  MCV 88.0 80.0 - 100.0 fL   MCH 30.1 26.0 - 34.0 pg   MCHC 34.2 30.0 - 36.0 g/dL   RDW 14.6 11.5 - 15.5 %   Platelets 162 150 - 400 K/uL   nRBC 0.0 0.0 - 0.2 %    Comment: Performed at [facility], [place] [address] [place], [place] [address]  Basic metabolic panel     Status: Abnormal   Collection Time: 04/11/20  1:59 AM  Result Value Ref Range   Sodium 138 135 - 145 mmol/L   Potassium 4.0 3.5 - 5.1 mmol/L   Chloride 103 98 - 111 mmol/L   CO2 28 22 - 32 mmol/L   Glucose, Bld 138 (H) 70 - 99 mg/dL    Comment: Glucose reference range applies only to samples taken after fasting for at least 8 hours.   BUN 58 (H) 8 - 23 mg/dL   Creatinine, Ser 1.39 (H) 0.44 - 1.00 mg/dL   Calcium 10.9 (H) 8.9 - 10.3 mg/dL   GFR, Estimated 40 (L) >60 mL/min    Comment: (NOTE) Calculated using the CKD-EPI Creatinine Equation (2021)    Anion gap 7 5 - 15    Comment: Performed at [facility] [address], [place], [place] [address]  Magnesium     Status: None   Collection Time: 04/11/20  1:59 AM  Result Value Ref Range   Magnesium 2.3 1.7 - 2.4 mg/dL    Comment: Performed at [facility] [address], [place], [place] [address]  Troponin I (High Sensitivity)     Status: Abnormal   Collection Time: 04/11/20  2:28 PM  Result Value Ref Range   Troponin I (High Sensitivity) 181 (HH) <18 ng/L    Comment: CRITICAL RESULT CALLED TO, READ BACK BY AND VERIFIED WITH: A.ALBERT RN [phone] 04/11/20 MCCORMICK K (NOTE) Elevated high sensitivity troponin I (hsTnI) values and significant  changes across serial measurements may suggest ACS but many other  chronic and acute conditions are known to elevate hsTnI results.  Refer to the Links section for chest pain algorithms and additional  guidance. Performed at [facility], [place] [address] [place], [place] [address]   Troponin I (High Sensitivity)     Status: Abnormal   Collection Time: 04/11/20  3:20 PM   Result Value Ref Range   Troponin I (High Sensitivity) 186 (HH) <18 ng/L    Comment: CRITICAL VALUE NOTED.  VALUE IS CONSISTENT WITH PREVIOUSLY REPORTED AND CALLED VALUE. (NOTE) Elevated high sensitivity troponin I (hsTnI) values and significant  changes across serial measurements may suggest ACS but many other  chronic and acute conditions are known to elevate hsTnI results.  Refer to the Links section for chest pain algorithms and additional  guidance. Performed at [facility], [place] [address], [place], [place] [address]    No results found.  Review Of Systems Constitutional: Positive fever, chills, chronic weight gain. Eyes: No vision change, wears glasses. No discharge or pain. Ears: No hearing loss, No tinnitus. Respiratory: No asthma, COPD, positive pneumonias, shortness of breath. No hemoptysis. Cardiovascular: Positive chest pain, palpitation, leg edema. Gastrointestinal: No nausea, vomiting, diarrhea, constipation. No GI bleed. No hepatitis. Genitourinary: No dysuria, hematuria, kidney stone. No incontinance. Neurological: No headache, stroke, seizures.  Psychiatry: No psych facility admission for anxiety, depression, suicide. No detox. Skin: No rash. Musculoskeletal: Positive joint pain, fibromyalgia. No neck pain, back pain. Lymphadenopathy: No lymphadenopathy. Hematology: No anemia or easy  bruising.   Blood pressure (!) 92/57, pulse 85, temperature 98.6 F (37 C), temperature source Oral, resp. rate 20, height 5\' 3"  (1.6 m), weight 96.1 kg, SpO2 97 %. Body mass index is 37.53 kg/m. General appearance: alert, cooperative, appears stated age and moderate respiratory distress Head: Normocephalic, atraumatic. Eyes: Brown eyes, pink conjunctiva, corneas clear. PERRL, EOM's intact. Neck: No adenopathy, no carotid bruit, no JVD, supple, symmetrical, trachea midline and thyroid not enlarged. Resp: Clearing to auscultation bilaterally. Chest wall non-tender on  palpation. Cardio: Regular rate and rhythm, S1, S2 normal, II/VI systolic murmur, no click, rub or gallop GI: Soft, non-tender; bowel sounds normal; no organomegaly. Extremities: No edema, cyanosis or clubbing. Skin: Warm and dry.  Neurologic: Alert and oriented X 3, normal strength.  Assessment/Plan Acute coronary syndrome Acute on chronic respiratory failure with hypoxia COVID - 19 pneumonia, multifocal Hypothyroidism Obesity  Agree with IV heparin. Recheck Troponin I in AM. Will use small dose atorvastatin and increase dose if tolerated. Will use small dose coreg or metoprolol as tolerated.  Time spent: Review of old records, Lab, x-rays, EKG, other cardiac tests, examination, discussion with patient, nurse and physician over 70 minutes.  [name], MD  04/11/2020, 6:18 PM

## 2020-04-12 DIAGNOSIS — R778 Other specified abnormalities of plasma proteins: Secondary | ICD-10-CM | POA: Diagnosis not present

## 2020-04-12 DIAGNOSIS — U071 COVID-19: Secondary | ICD-10-CM | POA: Diagnosis not present

## 2020-04-12 DIAGNOSIS — J9601 Acute respiratory failure with hypoxia: Secondary | ICD-10-CM | POA: Diagnosis not present

## 2020-04-12 DIAGNOSIS — I214 Non-ST elevation (NSTEMI) myocardial infarction: Secondary | ICD-10-CM

## 2020-04-12 LAB — HEPARIN LEVEL (UNFRACTIONATED)
Heparin Unfractionated: 1.54 IU/mL — ABNORMAL HIGH (ref 0.30–0.70)
Heparin Unfractionated: 1.1 IU/mL — ABNORMAL HIGH (ref 0.30–0.70)

## 2020-04-12 LAB — BASIC METABOLIC PANEL
Anion gap: 8 (ref 5–15)
BUN: 52 mg/dL — ABNORMAL HIGH (ref 8–23)
CO2: 30 mmol/L (ref 22–32)
Calcium: 10.8 mg/dL — ABNORMAL HIGH (ref 8.9–10.3)
Chloride: 100 mmol/L (ref 98–111)
Creatinine, Ser: 1.29 mg/dL — ABNORMAL HIGH (ref 0.44–1.00)
GFR, Estimated: 44 mL/min — ABNORMAL LOW (ref >60)
Glucose, Bld: 96 mg/dL (ref 70–99)
Potassium: 4.8 mmol/L (ref 3.5–5.1)
Sodium: 138 mmol/L (ref 135–145)

## 2020-04-12 LAB — CBC
HCT: 40 % (ref 36.0–46.0)
Hemoglobin: 13.4 g/dL (ref 12.0–15.0)
MCH: 29.8 pg (ref 26.0–34.0)
MCHC: 33.5 g/dL (ref 30.0–36.0)
MCV: 88.9 fL (ref 80.0–100.0)
Platelets: 162 10*3/uL (ref 150–400)
RBC: 4.5 MIL/uL (ref 3.87–5.11)
RDW: 15 % (ref 11.5–15.5)
WBC: 14.4 10*3/uL — ABNORMAL HIGH (ref 4.0–10.5)
nRBC: 0.1 % (ref 0.0–0.2)

## 2020-04-12 LAB — CBC WITH DIFFERENTIAL/PLATELET
Abs Immature Granulocytes: 0.13 10*3/uL — ABNORMAL HIGH (ref 0.00–0.07)
Basophils Absolute: 0 10*3/uL (ref 0.0–0.1)
Basophils Relative: 0 %
Eosinophils Absolute: 0.1 10*3/uL (ref 0.0–0.5)
Eosinophils Relative: 1 %
HCT: 38.1 % (ref 36.0–46.0)
Hemoglobin: 12.8 g/dL (ref 12.0–15.0)
Immature Granulocytes: 1 %
Lymphocytes Relative: 9 %
Lymphs Abs: 1.2 10*3/uL (ref 0.7–4.0)
MCH: 29.8 pg (ref 26.0–34.0)
MCHC: 33.6 g/dL (ref 30.0–36.0)
MCV: 88.8 fL (ref 80.0–100.0)
Monocytes Absolute: 0.5 10*3/uL (ref 0.1–1.0)
Monocytes Relative: 4 %
Neutro Abs: 10.9 10*3/uL — ABNORMAL HIGH (ref 1.7–7.7)
Neutrophils Relative %: 85 %
Platelets: 145 10*3/uL — ABNORMAL LOW (ref 150–400)
RBC: 4.29 MIL/uL (ref 3.87–5.11)
RDW: 15.5 % (ref 11.5–15.5)
WBC: 12.8 10*3/uL — ABNORMAL HIGH (ref 4.0–10.5)
nRBC: 0.3 % — ABNORMAL HIGH (ref 0.0–0.2)

## 2020-04-12 LAB — TROPONIN I (HIGH SENSITIVITY)
Troponin I (High Sensitivity): 237 ng/L (ref <18)
Troponin I (High Sensitivity): 252 ng/L (ref <18)

## 2020-04-12 LAB — LACTIC ACID, PLASMA: Lactic Acid, Venous: 2.6 mmol/L (ref 0.5–1.9)

## 2020-04-12 LAB — COMPREHENSIVE METABOLIC PANEL
ALT: 51 U/L — ABNORMAL HIGH (ref 0–44)
AST: 25 U/L (ref 15–41)
Albumin: 2.7 g/dL — ABNORMAL LOW (ref 3.5–5.0)
Alkaline Phosphatase: 51 U/L (ref 38–126)
Anion gap: 8 (ref 5–15)
BUN: 49 mg/dL — ABNORMAL HIGH (ref 8–23)
CO2: 26 mmol/L (ref 22–32)
Calcium: 10.3 mg/dL (ref 8.9–10.3)
Chloride: 103 mmol/L (ref 98–111)
Creatinine, Ser: 1.38 mg/dL — ABNORMAL HIGH (ref 0.44–1.00)
GFR, Estimated: 40 mL/min — ABNORMAL LOW (ref >60)
Glucose, Bld: 107 mg/dL — ABNORMAL HIGH (ref 70–99)
Potassium: 4.1 mmol/L (ref 3.5–5.1)
Sodium: 137 mmol/L (ref 135–145)
Total Bilirubin: 1 mg/dL (ref 0.3–1.2)
Total Protein: 4.8 g/dL — ABNORMAL LOW (ref 6.5–8.1)

## 2020-04-12 MED ORDER — MORPHINE SULFATE (PF) 2 MG/ML IV SOLN: 1.0000 mg | INTRAVENOUS | Status: DC | PRN

## 2020-04-12 MED ORDER — HEPARIN (PORCINE) 25000 UT/250ML-% IV SOLN
500.0000 [IU]/h | INTRAVENOUS | Status: DC
  Administered 2020-04-12: 750 [IU]/h via INTRAVENOUS

## 2020-04-12 MED ORDER — SODIUM CHLORIDE 0.9 % IV BOLUS
250.0000 mL | Freq: Once | INTRAVENOUS | Status: AC
  Administered 2020-04-12: 250 mL via INTRAVENOUS

## 2020-04-12 MED ORDER — MORPHINE SULFATE (PF) 2 MG/ML IV SOLN
1.0000 mg | Freq: Once | INTRAVENOUS | Status: AC
  Administered 2020-04-12: 1 mg via INTRAVENOUS
  Filled 2020-04-12: qty 1

## 2020-04-12 NOTE — Progress Notes (Deleted)
Occupational Therapy Treatment Patient Details Name: Amanda Ross MRN: [id number] DOB: 1945/10/20 Today's Date: 04/12/2020    History of present illness 74 y.o. female with history of asthma, HTN, OSA, primary hypothyroidism and left thyroid nodule resented with myalgias, chills, intermittent cough) shortness of breath.  She tested positive for COVID-19 at her PCP office.  She apparently desaturated into the mid 80s and was sent over to the emergency department.  Chest x-ray did not show any significant findings.  CT angiogram did not show PE but did show multifocal patchy airspace opacities right greater than left. Elevated troponins on 10/25 with chest pain. EKG 10/25 no changes.    OT comments  Pt with recent chest pain and elevated troponin level; limiting therapy session to transfers only. Pt performing stand pivot to [facility] with Supervision. Pt washing her face while seated and completed peri care with Supervision. SpO2 88-85% on 6L via HFNC with activity. Pt requesting to return to bed due to fatigue. Pt denies any pain and dizziness this session. Will continue to follow acutely as admitted and continue to recommend dc to home with [place].    Follow Up Recommendations  Home health OT;Supervision - Intermittent    Equipment Recommendations  Tub/shower bench    Recommendations for Other Services      Precautions / Restrictions Precautions Precautions: Fall Precaution Comments: watch O2       Mobility Bed Mobility Overal bed mobility: Needs Assistance Bed Mobility: Supine to Sit;Sit to Supine     Supine to sit: Supervision Sit to supine: Supervision   General bed mobility comments: Supervision for safety  Transfers Overall transfer level: Needs assistance   Transfers: Stand Pivot Transfers;Sit to/from Stand Sit to Stand: Supervision Stand pivot transfers: Supervision       General transfer comment: Close supervision for safety    Balance Overall balance  assessment: Needs assistance Sitting-balance support: No upper extremity supported;Feet supported Sitting balance-Leahy Scale: Good     Standing balance support: No upper extremity supported;During functional activity Standing balance-Leahy Scale: Good                             ADL either performed or assessed with clinical judgement   ADL Overall ADL's : Needs assistance/impaired                         Toilet Transfer: Supervision/safety;Stand-pivot;BSC Armed forces technical officer Details (indicate cue type and reason): Supervision for safety Toileting- Clothing Manipulation and Hygiene: Supervision/safety;Sit to/from stand Toileting - Clothing Manipulation Details (indicate cue type and reason): Spervision for safety. HR elevating to 120, RR 30s, and SpO2 70-80s (poor pleth)     Functional mobility during ADLs: Supervision/safety (stand pivot only) General ADL Comments: Supervision for safety. Pt using BSC and then requesting to return to bed     Vision       Perception     Praxis      Cognition Arousal/Alertness: Awake/alert Behavior During Therapy: WFL for tasks assessed/performed Overall Cognitive Status: Within Functional Limits for tasks assessed                                 General Comments: Very pleasant. fatigued        Exercises Exercises: Other exercises Other Exercises Other Exercises: Flutter valve; 10x   Shoulder Instructions       General  Comments 85-88% on 6L while seated at [facility] RR 20s. HR 90s at rest and 110s with transfer. During peri care, HR elevating to 120    Pertinent Vitals/ Pain       Pain Assessment: No/denies pain  Home Living                                          Prior Functioning/Environment              Frequency  Min 2X/week        Progress Toward Goals  OT Goals(current goals can now be found in the care plan section)  Progress towards OT goals: Progressing  toward goals  Acute Rehab OT Goals Patient Stated Goal: go home soon OT Goal Formulation: With patient Time For Goal Achievement: 04/11/20 Potential to Achieve Goals: Good ADL Goals Pt Will Transfer to Toilet: with modified independence;ambulating;regular height toilet Pt Will Perform Tub/Shower Transfer: Tub transfer;with modified independence;ambulating;shower seat Additional ADL Goal #1: Pt to demonstrate implementation of at least 2 energy conservation strategies during ADLs/IADLs Additional ADL Goal #2: Pt to demonstrate ability to complete ADL/IADL tasks >5-8 minutes without rest break in order to improve endurance  Plan Discharge plan needs to be updated    Co-evaluation                 AM-PAC OT "6 Clicks" Daily Activity     Outcome Measure   Help from another person eating meals?: None Help from another person taking care of personal grooming?: A Little Help from another person toileting, which includes using toliet, bedpan, or urinal?: A Little Help from another person bathing (including washing, rinsing, drying)?: A Little Help from another person to put on and taking off regular upper body clothing?: None Help from another person to put on and taking off regular lower body clothing?: A Little 6 Click Score: 20    End of Session Equipment Utilized During Treatment: Oxygen  OT Visit Diagnosis: Other (comment);Unsteadiness on feet (R26.81);Other abnormalities of gait and mobility (R26.89);Muscle weakness (generalized) (M62.81) (decreased cardiopulmonary tolerance)   Activity Tolerance Patient tolerated treatment well   Patient Left in bed;with call bell/phone within reach   Nurse Communication Mobility status        Time: 6712-4580 OT Time Calculation (min): 22 min  Charges: OT General Charges $OT Visit: 1 Visit OT Treatments $Self Care/Home Management : 8-22 mins  [place], OTR/L Acute Rehab Pager: [phone] Office:  [place] 04/12/2020, 2:55 PM

## 2020-04-12 NOTE — Care Management Important Message (Signed)
Important Message  Patient Details  Name: BREANE GRUNWALD MRN: [id number] Date of Birth: 12-01-1945   Medicare Important Message Given:  Yes - Important Message mailed due to current National Emergency  Verbal consent obtained due to current National Emergency  Relationship to patient: Self Contact Name: Fynn Adel Call Date: 04/12/20  Time: 1000 Phone: [phone] Outcome: Spoke with contact Important Message mailed to: Patient address on file    [place] 04/12/2020, 10:00 AM

## 2020-04-12 NOTE — Progress Notes (Signed)
   04/12/20 2114  Vitals  Temp 98.3 F (36.8 C)  Temp Source Oral  BP (!) 86/64  MAP (mmHg) 72  BP Location Left Wrist  BP Method Automatic  Patient Position (if appropriate) Lying  Pulse Rate 88  Pulse Rate Source Monitor  ECG Heart Rate 88  Resp 20  MEWS COLOR  MEWS Score Color Green  Oxygen Therapy  SpO2 97 %  MEWS Score  MEWS Temp 0  MEWS Systolic 1  MEWS Pulse 0  MEWS RR 0  MEWS LOC 0  MEWS Score 1   Pts BP 86/64. MD alerted for which labs were ordered and a 250 NS bolus. Bolus given and pts BP is now normotensive. Lactic acid came back at 2.6 and MD notified, currently awaiting response. Pt is lying in bed with no s/s of distress and has no c/o currently. Will cont to monitor.

## 2020-04-12 NOTE — Progress Notes (Signed)
[place] for IV Heparin Indication: chest pain/ACS  Allergies  Allergen Reactions  . Ivp Dye [Iodinated Diagnostic Agents] Hives  . Percocet [Oxycodone-Acetaminophen] Nausea And Vomiting  . Iohexol   . Penicillins Hives    Did it involve swelling of the face/tongue/throat, SOB, or low BP? N Did it involve sudden or severe rash/hives, skin peeling, or any reaction on the inside of your mouth or nose? Y Did you need to seek medical attention at a hospital or doctor's office? N When did it last happen?Several Years Ago If all above answers are "NO", may proceed with cephalosporin use.   . Prednisone Nausea Only    Patient Measurements: Height: 5\' 3"  (160 cm) Weight: 96.1 kg (211 lb 13.8 oz) IBW/kg (Calculated) : 52.4 Heparin Dosing Weight: 75 kg  Vital Signs: Temp: 98.3 F (36.8 C) (10/26 1133) Temp Source: Oral (10/26 1133) BP: 101/70 (10/26 1133) Pulse Rate: 79 (10/26 1133)  Labs: Recent Labs    04/10/20 0430 04/10/20 0430 04/11/20 0159 04/11/20 1428 04/11/20 1520 04/12/20 0300 04/12/20 0814 04/12/20 1416  HGB 13.5   < > 13.0  --   --  13.4  --   --   HCT 39.4  --  38.0  --   --  40.0  --   --   PLT 174  --  162  --   --  162  --   --   HEPARINUNFRC  --   --   --   --   --  1.54*  --  1.10*  CREATININE 1.39*  --  1.39*  --   --  1.29*  --   --   TROPONINIHS  --   --   --  181* 186*  --  237*  --    < > = values in this interval not displayed.    Estimated Creatinine Clearance: 42.2 mL/min (A) (by C-G formula based on SCr of 1.29 mg/dL (H)).  Assessment: 74 yr old female admitted on 03/24/20 with acute respiratory failure due to COVID; d-dimer 0.44 on admission, 0.38 on 04/07/20. Pt has been receiving enoxaparin 40 mg SQ BID for VTE prophylaxis (last dose at 0175 AM today). Pharmacy is consulted to dose IV heparin (no bolus, per MD) for chest pain and elevated troponins until ACS can be ruled out.  H/H, platelets  WNL  10/26 AM update:  Heparin level elevated No issues per RN   Goal of Therapy:  Heparin level 0.3-0.7 units/ml Monitor platelets by anticoagulation protocol: Yes   Plan:  Heparin to 500 units / hr 8 hour heparin level Heparin to stop at 10/27 at 11 am  Thank you Anette Guarneri, PharmD

## 2020-04-12 NOTE — Consult Note (Signed)
Ref: [name], MD   Subjective:  Awake. VS stable. Mild respiratory distress continues.  EKG essentially unchanged as SR, LVH and non-specific ST changes. HS-Troponin I levels are slightly up at 237 from 186 ng. Demand ischemia is suspected. CAD is possible due to advanced age and chest pain. Morphine 1 mg. gave relief of chest pain. Discussed cardiac cath in near future. Patient will be non-infectious from COVID pneumonia in few days.  Objective:  Vital Signs in the last 24 hours: Temp:  [98 F (36.7 C)-98.6 F (37 C)] 98 F (36.7 C) (10/26 0517) Pulse Rate:  [71-100] 71 (10/26 0517) Cardiac Rhythm: Normal sinus rhythm (10/26 0715) Resp:  [18-20] 20 (10/26 0517) BP: (92-115)/(57-77) 101/70 (10/26 0517) SpO2:  [90 %-97 %] 93 % (10/26 0715) Weight:  [96.1 kg] 96.1 kg (10/25 1605)  Physical Exam: BP Readings from Last 1 Encounters:  04/12/20 101/70     Wt Readings from Last 1 Encounters:  04/11/20 96.1 kg    Weight change:  Body mass index is 37.53 kg/m. HEENT: [place]/AT, Eyes-Brown, PERL, EOMI, Conjunctiva-Pink, Sclera-Non-icteric Neck: No JVD, No bruit, Trachea midline. Lungs:  Clear, Bilateral. Cardiac:  Regular rhythm, normal S1 and S2, no S3. II/VI systolic murmur. Abdomen:  Soft, non-tender. BS present. Extremities:  No edema present. No cyanosis. No clubbing. CNS: AxOx3, Cranial nerves grossly intact, moves all 4 extremities.  Skin: Warm and dry.   Intake/Output from previous day: 10/25 0701 - 10/26 0700 In: 246 [P.O.:240; I.V.:6] Out: -     Lab Results: BMET    Component Value Date/Time   NA 138 04/12/2020 0300   NA 138 04/11/2020 0159   NA 138 04/10/2020 0430   NA 144 03/02/2020 1204   NA 143 07/29/2019 1521   NA 141 03/24/2019 1509   K 4.8 04/12/2020 0300   K 4.0 04/11/2020 0159   K 4.1 04/10/2020 0430   CL 100 04/12/2020 0300   CL 103 04/11/2020 0159   CL 99 04/10/2020 0430   CO2 30 04/12/2020 0300   CO2 28 04/11/2020 0159   CO2 29  04/10/2020 0430   GLUCOSE 96 04/12/2020 0300   GLUCOSE 138 (H) 04/11/2020 0159   GLUCOSE 146 (H) 04/10/2020 0430   BUN 52 (H) 04/12/2020 0300   BUN 58 (H) 04/11/2020 0159   BUN 53 (H) 04/10/2020 0430   BUN 19 03/02/2020 1204   BUN 16 07/29/2019 1521   BUN 19 03/24/2019 1509   CREATININE 1.29 (H) 04/12/2020 0300   CREATININE 1.39 (H) 04/11/2020 0159   CREATININE 1.39 (H) 04/10/2020 0430   CREATININE 1.37 (H) 10/21/2019 1126   CREATININE 1.16 (H) 12/15/2018 1459   CREATININE 1.17 (H) 01/28/2018 1234   CALCIUM 10.8 (H) 04/12/2020 0300   CALCIUM 10.9 (H) 04/11/2020 0159   CALCIUM 10.8 (H) 04/10/2020 0430   GFRNONAA 44 (L) 04/12/2020 0300   GFRNONAA 40 (L) 04/11/2020 0159   GFRNONAA 40 (L) 04/10/2020 0430   GFRNONAA 38 (L) 10/21/2019 1126   GFRNONAA 47 (L) 12/15/2018 1459   GFRNONAA 47 (L) 01/28/2018 1234   GFRAA 48 (L) 03/02/2020 1204   GFRAA 44 (L) 10/21/2019 1126   GFRAA 52 (L) 07/29/2019 1521   GFRAA 48 (L) 03/24/2019 1509   GFRAA 54 (L) 12/15/2018 1459   GFRAA 54 (L) 01/28/2018 1234   CBC    Component Value Date/Time   WBC 14.4 (H) 04/12/2020 0300   RBC 4.50 04/12/2020 0300   HGB 13.4 04/12/2020 0300   HCT 40.0  04/12/2020 0300   PLT 162 04/12/2020 0300   MCV 88.9 04/12/2020 0300   MCH 29.8 04/12/2020 0300   MCHC 33.5 04/12/2020 0300   RDW 15.0 04/12/2020 0300   LYMPHSABS 0.8 04/07/2020 0625   MONOABS 0.6 04/07/2020 0625   EOSABS 0.0 04/07/2020 0625   BASOSABS 0.0 04/07/2020 0625   HEPATIC Function Panel Recent Labs    04/05/20 0046 04/06/20 0357 04/07/20 0625  PROT 5.6* 5.3* 5.7*   HEMOGLOBIN A1C No components found for: HGA1C,  MPG CARDIAC ENZYMES No results found for: CKTOTAL, CKMB, CKMBINDEX, TROPONINI BNP No results for input(s): PROBNP in the last 8760 hours. TSH No results for input(s): TSH in the last 8760 hours. CHOLESTEROL Recent Labs    03/02/20 1204  CHOL 161    Scheduled Meds: . (feeding supplement) PROSource Plus  30 mL Oral BID BM   . vitamin C  500 mg Oral Daily  . aspirin EC  81 mg Oral Daily  . atorvastatin  10 mg Oral Daily  . cholecalciferol  2,000 Units Oral Daily  . diclofenac Sodium  2 g Topical BID  . feeding supplement  1 Container Oral TID BM  . mouth rinse  15 mL Mouth Rinse BID  . metoprolol tartrate  12.5 mg Oral BID  . oxybutynin  10 mg Oral QHS  . pantoprazole  40 mg Oral Daily  . predniSONE  20 mg Oral Q breakfast  . traZODone  50 mg Oral QHS  . zinc sulfate  220 mg Oral Daily   Continuous Infusions: . heparin 750 Units/hr (04/12/20 0705)   PRN Meds:.acetaminophen, baclofen, bisacodyl, chlorpheniramine-HYDROcodone, guaiFENesin-dextromethorphan, hydrALAZINE, hydroxypropyl methylcellulose / hypromellose, Ipratropium-Albuterol, magnesium citrate, morphine injection, nitroGLYCERIN, [DISCONTINUED] ondansetron **OR** ondansetron (ZOFRAN) IV, polyethylene glycol, simethicone, sodium chloride, zolpidem  Assessment/Plan: Acute coronary syndrome Acute on chronic respiratory failure with hypoxia COVID- 19 pneumonia Hypothyroidism Obesity Abnormal HS-troponin I from demand ischemia  Continue IV heparin for 24 hours then discontinue.  Cardiac catheterization in near future. Patient understood risks and alternatives.   LOS: 19 days   Time spent including chart review, lab review, examination, discussion with patient : 30 min   Dixie Dials  MD  04/12/2020, 11:15 AM

## 2020-04-12 NOTE — Progress Notes (Signed)
PROGRESS NOTE  Amanda Ross GHW:[id number] DOB: [birth year] DOA: 03/24/2020  PCP: [name], MD  Brief History/Interval Summary: 74 y.o. female with history of asthma, HTN, OSA, primary hypothyroidism and left thyroid nodule resented with myalgias, chills, intermittent cough) shortness of breath.  She tested positive for COVID-19 at her PCP office.  She apparently desaturated into the mid 80s and was sent over to the emergency department.  Chest x-ray did not show any significant findings.  CT angiogram did not show PE but did show multifocal patchy airspace opacities right greater than left.  -Patient with significant oxygen requirement requiring high flow nasal cannula, she did show some improvement, on 10/25 she did have some chest pain which was resolved by nitro, she had some EKG abnormalities with elevated high-sensitivity troponins, cardiology were consulted for which she was started on treatment for acute coronary syndrome.  Reason for Visit: Pneumonia due to COVID-19.  Acute respiratory failure with hypoxia.  Consultants: None  Procedures:   CTA - No PE  Leg [place] - No DVT  TTE.  Antibiotics: Anti-infectives (From admission, onward)   Start     Dose/Rate Route Frequency Ordered Stop   03/25/20 1000  remdesivir 100 mg in sodium chloride 0.9 % 100 mL IVPB       "Followed by" Linked Group Details   100 mg 200 mL/hr over 30 Minutes Intravenous Daily 03/24/20 1533 03/28/20 0900   03/24/20 1545  remdesivir 200 mg in sodium chloride 0.9% 250 mL IVPB       "Followed by" Linked Group Details   200 mg 580 mL/hr over 30 Minutes Intravenous Once 03/24/20 1533 03/24/20 1940      Subjective/Interval History:   Reported dyspnea at baseline, reports he had some mild chest pain which resolved with morphine .  Assessment/Plan:  Acute Hypoxic Resp. Failure/Pneumonia due to COVID-19  - she is fully vaccinated and unfortunately has breakthrough disease, initially responded well  to IV steroids and Remdesivir however on 03/29/2020 more hypoxic. -  she was given Actemra on 03/29/2020. -Treated with steroids, currently tapered off -On Lasix as needed. -Overall her oxygen requirement has improved for which she has been tolerating 5 L nasal cannula recently, given her diagnosis of ACS, I have increased her oxygen to allow for O2 sat to be more than 95%, for which she is currently on 8 L high flow nasal cannula. -She was encouraged use incentive spirometry, flutter valve, out of bed to chair.  Encouraged her to sit up in chair in the daytime use I-S and flutter valve for pulmonary toiletry and prone at night.  SpO2: 96 % O2 Flow Rate (L/min): 8 L/min FiO2 (%): 70 %   Recent Labs  Lab 04/06/20 0357 04/07/20 0625 04/10/20 0430 04/11/20 0159 04/12/20 0300  WBC 19.8* 17.5* 14.5* 15.6* 14.4*  HGB 13.6 14.4 13.5 13.0 13.4  HCT 40.6 42.8 39.4 38.0 40.0  PLT 224 215 174 162 162  CRP 0.6 0.5  --   --   --   BNP 121.8* 105.3*  --   --   --   DDIMER 0.36 0.38  --   --   --   PROCALCITON <0.10 <0.10  --   --   --   AST 18 18  --   --   --   ALT 24 28  --   --   --   ALKPHOS 49 52  --   --   --   BILITOT 0.9 1.0  --   --   --  ALBUMIN 2.6* 2.9*  --   --   --      ACS/NSTEMI -Chest pain 10/26, with high-sensitivity troponin trending up 181> 186> 237 -Management per cardiology, to demand heparin GTT for another 24 hours, continue with aspirin and beta-blockers. -Likely will need cardiac cath, timing per cardiology.  1 out of 2 staph epidermis positive blood culture.   - Contaminant.     Acute kidney injury on chronic kidney disease stage IIIb  -   her baseline creatinine is around 1.4, AKI caused by acute infection and dehydration, after diuresis on 04/03/2020 -Monitor closely as she is on IV diuresis  Obstructive sleep apnea  - Continue with oxygen.  History of asthma  - Steroids and inhalers as above.  Essential hypertension  - on low dose B Blocker,  added IV hydralazine as needed.    History of overactive bladder  - Monitor. Continue oxybutynin.  Mild Thrombocytopenia  - Due to viral infection. Resolved.  Morbid obesity  - BMI  Of > 39 Follow with PCP for weight loss.   Anxiety -Start on BuSpar  DVT Prophylaxis: Lovenox Code Status: Full code Family Communication: Discussed with patient.  Called her husband [name]  # [phone] and updated 04/05/2020 . And 04/07/2020,10/24.  And updated on 10/26.  Disposition Plan: Hopefully return home when improved  Status is: Inpatient  Remains inpatient appropriate because:IV treatments appropriate due to intensity of illness or inability to take PO and Inpatient level of care appropriate due to severity of illness   Dispo: The patient is from: Home              Anticipated d/c is to: Home              Anticipated d/c date is: 3 days              Patient currently is not medically stable to d/c.   Medications:  Scheduled: . (feeding supplement) PROSource Plus  30 mL Oral BID BM  . vitamin C  500 mg Oral Daily  . aspirin EC  81 mg Oral Daily  . atorvastatin  10 mg Oral Daily  . cholecalciferol  2,000 Units Oral Daily  . diclofenac Sodium  2 g Topical BID  . feeding supplement  1 Container Oral TID BM  . mouth rinse  15 mL Mouth Rinse BID  . metoprolol tartrate  12.5 mg Oral BID  . oxybutynin  10 mg Oral QHS  . pantoprazole  40 mg Oral Daily  . predniSONE  20 mg Oral Q breakfast  . traZODone  50 mg Oral QHS  . zinc sulfate  220 mg Oral Daily   Continuous: . heparin 750 Units/hr (04/12/20 0705)   XTG:GYIRSWNIOEVOJ, baclofen, bisacodyl, chlorpheniramine-HYDROcodone, guaiFENesin-dextromethorphan, hydrALAZINE, hydroxypropyl methylcellulose / hypromellose, Ipratropium-Albuterol, magnesium citrate, morphine injection, nitroGLYCERIN, [DISCONTINUED] ondansetron **OR** ondansetron (ZOFRAN) IV, polyethylene glycol, simethicone, sodium chloride, zolpidem   Objective:  Vital  Signs  Vitals:   04/12/20 0517 04/12/20 0700 04/12/20 0715 04/12/20 1133  BP: 101/70   101/70  Pulse: 71   79  Resp: 20   (!) 24  Temp: 98 F (36.7 C)   98.3 F (36.8 C)  TempSrc: Oral   Oral  SpO2: 96% 93% 93% 96%  Weight:      Height:        Intake/Output Summary (Last 24 hours) at 04/12/2020 1611 Last data filed at 04/12/2020 1500 Gross per 24 hour  Intake 65.27 ml  Output --  Net 65.27  ml   Filed Weights   03/24/20 1033 04/11/20 1605  Weight: 102.1 kg 96.1 kg    EXAM  Awake Alert, Oriented X 3, No new F.N deficits, Normal affect Symmetrical Chest wall movement, Good air movement bilaterally, scattered rales RRR,No Gallops,Rubs or new Murmurs, No Parasternal Heave +ve B.Sounds, Abd Soft, No tenderness, No rebound - guarding or rigidity. No Cyanosis, Clubbing or edema, No new Rash or bruise       Lab Results:  Data Reviewed: I have personally reviewed following labs and imaging studies  Recent Labs  Lab 04/06/20 0357 04/07/20 0625 04/10/20 0430 04/11/20 0159 04/12/20 0300  WBC 19.8* 17.5* 14.5* 15.6* 14.4*  HGB 13.6 14.4 13.5 13.0 13.4  HCT 40.6 42.8 39.4 38.0 40.0  PLT 224 215 174 162 162  MCV 87.3 87.7 87.4 88.0 88.9  MCH 29.2 29.5 29.9 30.1 29.8  MCHC 33.5 33.6 34.3 34.2 33.5  RDW 13.9 14.0 14.4 14.6 15.0  LYMPHSABS 0.7 0.8  --   --   --   MONOABS 0.4 0.6  --   --   --   EOSABS 0.0 0.0  --   --   --   BASOSABS 0.0 0.0  --   --   --     Recent Labs  Lab 04/06/20 0357 04/06/20 0357 04/07/20 0625 04/07/20 0625 04/08/20 0237 04/09/20 0347 04/10/20 0430 04/11/20 0159 04/12/20 0300  NA 136   < > 138   < > 137 136 138 138 138  K 4.5   < > 4.5   < > 4.2 4.1 4.1 4.0 4.8  CL 100   < > 98   < > 99 98 99 103 100  CO2 25   < > 29   < > 29 28 29 28 30   GLUCOSE 139*   < > 126*   < > 123* 157* 146* 138* 96  BUN 55*   < > 49*   < > 51* 52* 53* 58* 52*  CREATININE 1.42*   < > 1.48*   < > 1.48* 1.57* 1.39* 1.39* 1.29*  CALCIUM 10.8*   < > 11.3*   <  > 10.9* 11.2* 10.8* 10.9* 10.8*  AST 18  --  18  --   --   --   --   --   --   ALT 24  --  28  --   --   --   --   --   --   ALKPHOS 49  --  52  --   --   --   --   --   --   BILITOT 0.9  --  1.0  --   --   --   --   --   --   ALBUMIN 2.6*  --  2.9*  --   --   --   --   --   --   MG  --   --   --   --   --   --   --  2.3  --   CRP 0.6  --  0.5  --   --   --   --   --   --   DDIMER 0.36  --  0.38  --   --   --   --   --   --   PROCALCITON <0.10  --  <0.10  --   --   --   --   --   --  BNP 121.8*  --  105.3*  --   --   --   --   --   --    < > = values in this interval not displayed.       No results found for this or any previous visit (from the past 240 hour(s)).    Radiology Studies: No results found.   LOS: 19 days   Investment banker, corporate on [email or web address]  04/12/2020, 4:11 PM

## 2020-04-12 NOTE — Progress Notes (Addendum)
Occupational Therapy Treatment Patient Details Name: Amanda Ross MRN: [id number] DOB: 11-01-1945 Today's Date: 04/12/2020    History of present illness 74 y.o. female with history of asthma, HTN, OSA, primary hypothyroidism and left thyroid nodule resented with myalgias, chills, intermittent cough) shortness of breath.  She tested positive for COVID-19 at her PCP office.  She apparently desaturated into the mid 80s and was sent over to the emergency department.  Chest x-ray did not show any significant findings.  CT angiogram did not show PE but did show multifocal patchy airspace opacities right greater than left. Elevated troponins on 10/25 with chest pain. EKG 10/25 no changes.    OT comments  Pt continues to be highly motivated to participate in therapy; limiting amount of activity as troponin still elevated; pt on heparin. Pt performing bed mobility with Supervision and then completing toileting with close supervision. SpO2 88-85% on 6L HFNC, RR 20s. HR 90s at rest and 120 with activity. Pt requesting to return to bed at end of session due to fatigue. Notified RN. Continue to recommend dc to home with HHOT and will continue to follow acutely as admitted.     Follow Up Recommendations  Home health OT;Supervision - Intermittent    Equipment Recommendations  Tub/shower bench    Recommendations for Other Services      Precautions / Restrictions Precautions Precautions: Fall Precaution Comments: watch O2       Mobility Bed Mobility Overal bed mobility: Needs Assistance Bed Mobility: Supine to Sit;Sit to Supine     Supine to sit: Supervision Sit to supine: Supervision   General bed mobility comments: Supervision for safety  Transfers Overall transfer level: Needs assistance   Transfers: Stand Pivot Transfers;Sit to/from Stand Sit to Stand: Supervision Stand pivot transfers: Supervision       General transfer comment: Close supervision for safety    Balance  Overall balance assessment: Needs assistance Sitting-balance support: No upper extremity supported;Feet supported Sitting balance-Leahy Scale: Good     Standing balance support: No upper extremity supported;During functional activity Standing balance-Leahy Scale: Good                             ADL either performed or assessed with clinical judgement   ADL Overall ADL's : Needs assistance/impaired                         Toilet Transfer: Supervision/safety;Stand-pivot;BSC Armed forces technical officer Details (indicate cue type and reason): Supervision for safety Toileting- Clothing Manipulation and Hygiene: Supervision/safety;Sit to/from stand Toileting - Clothing Manipulation Details (indicate cue type and reason): Spervision for safety. HR elevating to 120, RR 30s, and SpO2 70-80s (poor pleth)     Functional mobility during ADLs: Supervision/safety (stand pivot only) General ADL Comments: Supervision for safety. Pt using BSC and then requesting to return to bed     Vision       Perception     Praxis      Cognition Arousal/Alertness: Awake/alert Behavior During Therapy: WFL for tasks assessed/performed Overall Cognitive Status: Within Functional Limits for tasks assessed                                 General Comments: Very pleasant. fatigued        Exercises Exercises: Other exercises Other Exercises Other Exercises: Flutter valve; 10x   Shoulder Instructions  General Comments 85-88% on 6L while seated at [facility] RR 20s. HR 90s at rest and 110s with transfer. During peri care, HR elevating to 120    Pertinent Vitals/ Pain       Pain Assessment: No/denies pain  Home Living                                          Prior Functioning/Environment              Frequency  Min 2X/week        Progress Toward Goals  OT Goals(current goals can now be found in the care plan section)  Progress towards OT goals:  Progressing toward goals  Acute Rehab OT Goals Patient Stated Goal: go home soon OT Goal Formulation: With patient Time For Goal Achievement: 04/11/20 Potential to Achieve Goals: Good ADL Goals Pt Will Transfer to Toilet: with modified independence;ambulating;regular height toilet Pt Will Perform Tub/Shower Transfer: Tub transfer;with modified independence;ambulating;shower seat Additional ADL Goal #1: Pt to demonstrate implementation of at least 2 energy conservation strategies during ADLs/IADLs Additional ADL Goal #2: Pt to demonstrate ability to complete ADL/IADL tasks >5-8 minutes without rest break in order to improve endurance  Plan Discharge plan needs to be updated    Co-evaluation                 AM-PAC OT "6 Clicks" Daily Activity     Outcome Measure   Help from another person eating meals?: None Help from another person taking care of personal grooming?: A Little Help from another person toileting, which includes using toliet, bedpan, or urinal?: A Little Help from another person bathing (including washing, rinsing, drying)?: A Little Help from another person to put on and taking off regular upper body clothing?: None Help from another person to put on and taking off regular lower body clothing?: A Little 6 Click Score: 20    End of Session Equipment Utilized During Treatment: Oxygen  OT Visit Diagnosis: Other (comment);Unsteadiness on feet (R26.81);Other abnormalities of gait and mobility (R26.89);Muscle weakness (generalized) (M62.81) (decreased cardiopulmonary tolerance)   Activity Tolerance Patient tolerated treatment well   Patient Left in bed;with call bell/phone within reach   Nurse Communication Mobility status        Time: 8850-2774 OT Time Calculation (min): 22 min  Charges: OT General Charges $OT Visit: 1 Visit OT Treatments $Self Care/Home Management : 8-22 mins  Johnson, OTR/L Acute Rehab Pager: [phone] Office:  [place] 04/12/2020, 4:09 PM

## 2020-04-12 NOTE — Progress Notes (Signed)
[place] for IV Heparin Indication: chest pain/ACS  Allergies  Allergen Reactions  . Ivp Dye [Iodinated Diagnostic Agents] Hives  . Iohexol   . Penicillins Hives    Did it involve swelling of the face/tongue/throat, SOB, or low BP? N Did it involve sudden or severe rash/hives, skin peeling, or any reaction on the inside of your mouth or nose? Y Did you need to seek medical attention at a hospital or doctor's office? N When did it last happen?Several Years Ago If all above answers are "NO", may proceed with cephalosporin use.   Marland Kitchen Percocet [Oxycodone-Acetaminophen]   . Prednisone Nausea Only    Patient Measurements: Height: 5\' 3"  (160 cm) Weight: 96.1 kg (211 lb 13.8 oz) IBW/kg (Calculated) : 52.4 Heparin Dosing Weight: 75 kg  Vital Signs: Temp: 98 F (36.7 C) (10/26 0517) Temp Source: Oral (10/26 0517) BP: 101/70 (10/26 0517) Pulse Rate: 71 (10/26 0517)  Labs: Recent Labs    04/10/20 0430 04/10/20 0430 04/11/20 0159 04/11/20 1428 04/11/20 1520 04/12/20 0300  HGB 13.5   < > 13.0  --   --  13.4  HCT 39.4  --  38.0  --   --  40.0  PLT 174  --  162  --   --  162  HEPARINUNFRC  --   --   --   --   --  1.54*  CREATININE 1.39*  --  1.39*  --   --  1.29*  TROPONINIHS  --   --   --  181* 186*  --    < > = values in this interval not displayed.    Estimated Creatinine Clearance: 42.2 mL/min (A) (by C-G formula based on SCr of 1.29 mg/dL (H)).   Medical History: Past Medical History:  Diagnosis Date  . Asthma   . GERD (gastroesophageal reflux disease)   . H/O bladder infections   . H/O measles   . H/O varicella   . Headache(784.0)    Frequently  . Hypertension   . OSA (obstructive sleep apnea)   . Osteoarthritis   . Yeast infection     Assessment: 74 yr old female admitted on 03/24/20 with acute respiratory failure due to COVID; d-dimer 0.44 on admission, 0.38 on 04/07/20. Pt has been receiving enoxaparin 40 mg SQ  BID for VTE prophylaxis (last dose at 9150 AM today). Pharmacy is consulted to dose IV heparin (no bolus, per MD) for chest pain and elevated troponins until ACS can be ruled out.  H/H, platelets WNL  10/26 AM update:  Heparin level elevated No issues per RN   Goal of Therapy:  Heparin level 0.3-0.7 units/ml Monitor platelets by anticoagulation protocol: Yes   Plan:  Hold heparin x 1 hr Re-start heparin at 750 units/hr at 0700 1300 heparin level  Narda Bonds, PharmD, [place] Pharmacist Phone: [phone]

## 2020-04-12 NOTE — Consult Note (Signed)
   [facility] CM Inpatient Consult   04/12/2020  Amanda Ross Aug 21, 1945 [id number]  Follow up:  Progress  [facility] [ACO] Patient: Medicare NextGen  Electronic Medical Record review of progress with PT/OT. Today's MD notes reviewed and noted patient continues on high flow oxygen and chest pain noted.  Plan: Continue to follow for transitional needs.  Natividad Brood, RN BSN [facility] Liaison  [phone] business mobile phone Toll free office [phone]  Fax number: [phone] [email or web address]

## 2020-04-12 NOTE — Progress Notes (Signed)
PT Cancellation Note  Patient Details Name: [name] MRN: [id number] DOB: Jan 06, 1946   Cancelled Treatment:    Reason Eval/Treat Not Completed: Other (comment)  Pt continues to have elevated troponin.  Spoke with RN who reports pt ok for bed to chair but does not to do further exertion.  OT going to see pt for OOB transfer.  Will  F/u at later time as pt's mobility limited at this time due to elevated troponin and recent chest pain.  Abran Richard, PT Acute Rehab Services Pager [phone] [facility] [place] 04/12/2020, 2:00 PM

## 2020-04-12 NOTE — Progress Notes (Signed)
Nutrition Follow-up  DOCUMENTATION CODES:   Obesity unspecified  INTERVENTION:  Continue Boost Breeze po TID, each supplement provides 250 kcal and 9 grams of protein  Continue 30 ml Prosource plus po BID, each supplement provides 100 kcal and 15 grams of protein.   Encourage adequate PO intake.   NUTRITION DIAGNOSIS:   Increased nutrient needs related to catabolic illness ([id number]) as evidenced by estimated needs; ongoing  GOAL:   Patient will meet greater than or equal to 90% of their needs; progressing  MONITOR:   PO intake, Supplement acceptance, Labs, Weight trends, I & O's  REASON FOR ASSESSMENT:   LOS    ASSESSMENT:   Pt with a PMH significant for asthma, HTN, OSA, hypothyroidism and L thyroid nodule admitted with acute hypoxemic respiratory failure due to COVID-19.  Meal completion has been varied from 25-100%. Diet has been changed to regular diet to aid in po intake. Pt currently has Boost Breeze and Prosource plus and has been consuming them. RD to continue with nutritional supplementation to aid in caloric and protein needs. Pt encouraged to eat her food at meals and to drink her supplements.   Labs and medications reviewed.   Diet Order:   Diet Order            Diet regular Room service appropriate? Yes; Fluid consistency: Thin  Diet effective now                 EDUCATION NEEDS:   Not appropriate for education at this time  Skin:  Skin Assessment: Reviewed RN Assessment  Last BM:  10/23  Height:   Ht Readings from Last 1 Encounters:  03/24/20 5\' 3"  (1.6 m)    Weight:   Wt Readings from Last 1 Encounters:  04/11/20 96.1 kg   BMI:  Body mass index is 37.53 kg/m.  Estimated Nutritional Needs:   Kcal:  2150-2350  Protein:  115-135 grams  Fluid:  >2L/d  Corrin Parker, MS, RD, LDN RD pager number/after hours weekend pager number on Amion.

## 2020-04-12 NOTE — Progress Notes (Signed)
CRITICAL VALUE ALERT  Critical Value:  Troponin Level 237  Date & Time Notied:  04/12/2020 1000  Provider Notified: Dr. Landis Gandy  Orders Received/Actions taken: aware

## 2020-04-12 NOTE — Progress Notes (Signed)
Heparin gtts paused per verbal pharmacy order (held x1 hour). To be restarted at 0700.

## 2020-04-13 ENCOUNTER — Ambulatory Visit: Payer: Medicare Other | Admitting: Internal Medicine

## 2020-04-13 DIAGNOSIS — U071 COVID-19: Secondary | ICD-10-CM | POA: Diagnosis not present

## 2020-04-13 DIAGNOSIS — J9601 Acute respiratory failure with hypoxia: Secondary | ICD-10-CM | POA: Diagnosis not present

## 2020-04-13 DIAGNOSIS — R778 Other specified abnormalities of plasma proteins: Secondary | ICD-10-CM | POA: Diagnosis not present

## 2020-04-13 LAB — CBC
HCT: 35.8 % — ABNORMAL LOW (ref 36.0–46.0)
Hemoglobin: 12.3 g/dL (ref 12.0–15.0)
MCH: 30.4 pg (ref 26.0–34.0)
MCHC: 34.4 g/dL (ref 30.0–36.0)
MCV: 88.6 fL (ref 80.0–100.0)
Platelets: 144 10*3/uL — ABNORMAL LOW (ref 150–400)
RBC: 4.04 MIL/uL (ref 3.87–5.11)
RDW: 15.5 % (ref 11.5–15.5)
WBC: 11 10*3/uL — ABNORMAL HIGH (ref 4.0–10.5)
nRBC: 0.2 % (ref 0.0–0.2)

## 2020-04-13 LAB — BASIC METABOLIC PANEL
Anion gap: 8 (ref 5–15)
BUN: 44 mg/dL — ABNORMAL HIGH (ref 8–23)
CO2: 27 mmol/L (ref 22–32)
Calcium: 10.1 mg/dL (ref 8.9–10.3)
Chloride: 104 mmol/L (ref 98–111)
Creatinine, Ser: 1.23 mg/dL — ABNORMAL HIGH (ref 0.44–1.00)
GFR, Estimated: 46 mL/min — ABNORMAL LOW (ref >60)
Glucose, Bld: 89 mg/dL (ref 70–99)
Potassium: 3.9 mmol/L (ref 3.5–5.1)
Sodium: 139 mmol/L (ref 135–145)

## 2020-04-13 LAB — HEPARIN LEVEL (UNFRACTIONATED): Heparin Unfractionated: 0.4 IU/mL (ref 0.30–0.70)

## 2020-04-13 LAB — LACTIC ACID, PLASMA: Lactic Acid, Venous: 2 mmol/L (ref 0.5–1.9)

## 2020-04-13 MED ORDER — HYDROXYCHLOROQUINE SULFATE 200 MG PO TABS
200.0000 mg | ORAL_TABLET | Freq: Every morning | ORAL | Status: DC
  Administered 2020-04-14 – 2020-04-16 (×3): 200 mg via ORAL
  Filled 2020-04-13 (×3): qty 1

## 2020-04-13 MED ORDER — HEPARIN SODIUM (PORCINE) 5000 UNIT/ML IJ SOLN
5000.0000 [IU] | Freq: Three times a day (TID) | INTRAMUSCULAR | Status: DC
  Administered 2020-04-13 – 2020-04-15 (×6): 5000 [IU] via SUBCUTANEOUS
  Filled 2020-04-13 (×6): qty 1

## 2020-04-13 NOTE — Progress Notes (Signed)
[place] for Heparin Indication: chest pain/ACS  Allergies  Allergen Reactions  . Ivp Dye [Iodinated Diagnostic Agents] Hives  . Percocet [Oxycodone-Acetaminophen] Nausea And Vomiting  . Iohexol   . Penicillins Hives    Did it involve swelling of the face/tongue/throat, SOB, or low BP? N Did it involve sudden or severe rash/hives, skin peeling, or any reaction on the inside of your mouth or nose? Y Did you need to seek medical attention at a hospital or doctor's office? N When did it last happen?Several Years Ago If all above answers are "NO", may proceed with cephalosporin use.   . Prednisone Nausea Only    Patient Measurements: Height: 5\' 3"  (160 cm) Weight: 96.1 kg (211 lb 13.8 oz) IBW/kg (Calculated) : 52.4 Heparin Dosing Weight: 75 kg  Vital Signs: Temp: 98.1 F (36.7 C) (10/26 2227) Temp Source: Oral (10/26 2227) BP: 107/66 (10/26 2227) Pulse Rate: 87 (10/26 2227)  Labs: Recent Labs    04/11/20 0159 04/11/20 0159 04/11/20 1428 04/11/20 1520 04/12/20 0300 04/12/20 0300 04/12/20 0814 04/12/20 1416 04/12/20 2200 04/13/20 0146  HGB 13.0   < >  --   --  13.4   < >  --   --  12.8 12.3  HCT 38.0   < >  --   --  40.0  --   --   --  38.1 35.8*  PLT 162   < >  --   --  162  --   --   --  145* 144*  HEPARINUNFRC  --   --   --   --  1.54*  --   --  1.10*  --  0.40  CREATININE 1.39*  --   --   --  1.29*  --   --   --  1.38*  --   TROPONINIHS  --   --    < > 186*  --   --  237* 252*  --   --    < > = values in this interval not displayed.    Estimated Creatinine Clearance: 39.5 mL/min (A) (by C-G formula based on SCr of 1.38 mg/dL (H)).  Assessment: 74 y.o. female with chest pain for heparin    Goal of Therapy:  Heparin level 0.3-0.7 units/ml Monitor platelets by anticoagulation protocol: Yes   Plan:  Continue Heparin at current rate   Phillis Knack, PharmD, BCPS

## 2020-04-13 NOTE — Progress Notes (Signed)
   04/13/20 0735  Assess: MEWS Score  Temp 98.4 F (36.9 C)  BP (!) 91/55  Pulse Rate 91  ECG Heart Rate 90  Resp (!) 22  Level of Consciousness Alert  SpO2 92 %  O2 Device HFNC  Patient Activity (if Appropriate) In bed  O2 Flow Rate (L/min) 6 L/min  Assess: MEWS Score  MEWS Temp 0  MEWS Systolic 1  MEWS Pulse 0  MEWS RR 1  MEWS LOC 0  MEWS Score 2  MEWS Score Color Yellow  Assess: if the MEWS score is Yellow or Red  Were vital signs taken at a resting state? Yes  Focused Assessment No change from prior assessment  Early Detection of Sepsis Score *See Row Information* Low  MEWS guidelines implemented *See Row Information* Yes  Treat  MEWS Interventions Escalated (See documentation below)  Pain Scale 0-10  Pain Score 0  Take Vital Signs  Increase Vital Sign Frequency  Yellow: Q 2hr X 2 then Q 4hr X 2, if remains yellow, continue Q 4hrs  Escalate  MEWS: Escalate Yellow: discuss with charge nurse/RN and consider discussing with provider and RRT  Notify: Charge Nurse/RN  Name of Charge Nurse/RN Notified Erin RN  Date Charge Nurse/RN Notified 04/13/20  Time Charge Nurse/RN Notified 0745  Notify: Provider  Provider Name/Title Dr. Oren Binet  Date Provider Notified 04/13/20  Time Provider Notified [phone]  Notification Type Page  Notification Reason Other (Comment) (pt in yellow mews)  Response No new orders  Date of Provider Response 04/13/20  Time of Provider Response 0800  Document  Progress note created (see row info) Yes

## 2020-04-13 NOTE — Progress Notes (Addendum)
PROGRESS NOTE                                                                                                                                                                                                             Patient Demographics:    Amanda Ross, is a 74 y.o. female, DOB - 02-Oct-1945, IHW:[id number]  Outpatient Primary MD for the patient is Amanda Chard, MD   Admit date - 03/24/2020   LOS - 82  Chief Complaint  Patient presents with  . Generalized Body Aches  . Shortness of Breath       Brief Narrative: Patient is a 74 y.o. female with PMHx of Sjogren's syndrome, HTN, asthma, left thyroid nodule-presented to the ED on 10/7 with acute hypoxic respiratory failure due to COVID-19 pneumonia.  On initial presentation-had mild hypoxemia-however developed worsening hypoxemia requiring HFNC.  COVID-19 vaccinated status: Vaccinated  Significant Events: 10/7>> Admit to [facility] for mild hypoxemia due to COVID-19 pneumonia 10/12>> worsening hypoxemia-s/p Actemra 10/25>> chest pain requiring cardiology consultation  Significant studies: 10/7>>Chest x-ray: No acute disease 10/8>> CTA chest: No PE, multifocal pneumonia 10/9>> lower extremity Doppler: No DVT 10/11>> chest x-ray: Multifocal airspace opacities 10/17>> bilateral pulmonary opacities 10/20>> Echo: EF 60-65%  COVID-19 medications: Steroids: 10/7>>10/26 Remdesivir: 10/7>> 10/11 Actemra: 10/12 x 1  Antibiotics: None  Microbiology data: 10/7 >>blood culture: 1/2  staph epidermidis 10/8>> blood culture: No growth  Procedures: None  Consults: Cardiology  DVT prophylaxis: heparin injection 5,000 Units Start: 04/13/20 1400    Subjective:    Amanda Ross today feels better-no chest pain-Down to 4-5 L of oxygen this morning.   Assessment  & Plan :   Acute Hypoxic Resp Failure due to Covid 19 Viral pneumonia: Gradually improving-no signs of  volume overload-has been titrated off steroids.  Continue attempts to titrate down FiO2 further.  Fever: afebrile O2 requirements:  SpO2: 91 % O2 Flow Rate (L/min): 5 L/min FiO2 (%): 70 %   COVID-19 Labs: No results for input(s): DDIMER, FERRITIN, LDH, CRP in the last 72 hours.     Component Value Date/Time   BNP 105.3 (H) 04/07/2020 0625    Recent Labs  Lab 04/07/20 0625  PROCALCITON <0.10    Lab Results  Component Value Date   SARSCOV2NAA POSITIVE (A) 03/24/2020   SARSCOV2NAA Detected (A) 03/24/2020  Prone/Incentive Spirometry: encouraged  incentive spirometry use 3-4/hour.  Non-STEMI: Symptom-free-medically managed-no longer on IV heparin-we will defer timing of LHC to cardiology.  Continue aspirin/statin/beta-blocker.  AKI on CKD stage IIIb: AKI hemodynamically mediated-improved-creatinine back to baseline.  Asthma: Stable-continue bronchodilators.  HTN: BP controlled-continue metoprolol-losartan/HCTZ on hold  History of overactive bladder: Continue oxybutynin  History of Sjogren's syndrome: Resume Plaquenil-on artificial tears.  Thrombocytopenia: Mild-stable for follow-up without further work-up.  OSA: On oxygen-not sure if she uses CPAP nightly at home.    Nutrition Problem: Nutrition Problem: Increased nutrient needs Etiology: catabolic illness ([id number]) Signs/Symptoms: estimated needs Interventions: Liberalize Diet, Boost Breeze, Prostat  Morbid Obesity: Estimated body mass index is 37.53 kg/m as calculated from the following:   Height as of this encounter: 5\' 3"  (1.6 m).   Weight as of this encounter: 96.1 kg.    GI prophylaxis: PPI  ABG: No results found for: PHART, PCO2ART, PO2ART, HCO3, TCO2, ACIDBASEDEF, O2SAT  Vent Settings: N/A  Condition - Stable  Family Communication  :  Spouse-Amanda Ross-[phone] updated over the phone on 10/27  Code Status :  Full Code  Diet :  Diet Order            Diet Heart Room service appropriate?  Yes; Fluid consistency: Thin  Diet effective now                  Disposition Plan  :   Status is: Inpatient  Remains inpatient appropriate because:Inpatient level of care appropriate due to severity of illness   Dispo:  Patient From: Home  Planned Disposition: Home  Expected discharge date: 04/15/20  Medically stable for discharge: No    Barriers to discharge: Hypoxia requiring O2 supplementation  Antimicorbials  :    Anti-infectives (From admission, onward)   Start     Dose/Rate Route Frequency Ordered Stop   03/25/20 1000  remdesivir 100 mg in sodium chloride 0.9 % 100 mL IVPB       "Followed by" Linked Group Details   100 mg 200 mL/hr over 30 Minutes Intravenous Daily 03/24/20 1533 03/28/20 0900   03/24/20 1545  remdesivir 200 mg in sodium chloride 0.9% 250 mL IVPB       "Followed by" Linked Group Details   200 mg 580 mL/hr over 30 Minutes Intravenous Once 03/24/20 1533 03/24/20 1940      Inpatient Medications  Scheduled Meds: . (feeding supplement) PROSource Plus  30 mL Oral BID BM  . vitamin C  500 mg Oral Daily  . aspirin EC  81 mg Oral Daily  . atorvastatin  10 mg Oral Daily  . cholecalciferol  2,000 Units Oral Daily  . diclofenac Sodium  2 g Topical BID  . feeding supplement  1 Container Oral TID BM  . heparin  5,000 Units Subcutaneous Q8H  . mouth rinse  15 mL Mouth Rinse BID  . metoprolol tartrate  12.5 mg Oral BID  . oxybutynin  10 mg Oral QHS  . pantoprazole  40 mg Oral Daily  . traZODone  50 mg Oral QHS  . zinc sulfate  220 mg Oral Daily   Continuous Infusions: PRN Meds:.acetaminophen, baclofen, bisacodyl, chlorpheniramine-HYDROcodone, guaiFENesin-dextromethorphan, hydrALAZINE, hydroxypropyl methylcellulose / hypromellose, Ipratropium-Albuterol, magnesium citrate, morphine injection, nitroGLYCERIN, [DISCONTINUED] ondansetron **OR** ondansetron (ZOFRAN) IV, polyethylene glycol, simethicone, sodium chloride, zolpidem   Time Spent in minutes   25  See all Orders from today for further details   Oren Binet M.D on 04/13/2020 at 2:52 PM  To page go to [email or web address] -  use universal password  [facility] -  Office  [phone]    Objective:   Vitals:   04/13/20 0505 04/13/20 0735 04/13/20 0945 04/13/20 1118  BP: 110/69 (!) 91/55 102/62 105/75  Pulse: 92 91 98 100  Resp: 20 (!) 22 20 20   Temp: 98 F (36.7 C) 98.4 F (36.9 C) 97.7 F (36.5 C) 98 F (36.7 C)  TempSrc: Oral Oral Oral Oral  SpO2: 93% 92% 91% 91%  Weight:      Height:        Wt Readings from Last 3 Encounters:  04/11/20 96.1 kg  03/02/20 105.7 kg  03/02/20 101.2 kg     Intake/Output Summary (Last 24 hours) at 04/13/2020 1452 Last data filed at 04/13/2020 0935 Gross per 24 hour  Intake 444.89 ml  Output --  Net 444.89 ml     Physical Exam Gen Exam:Alert awake-not in any distress HEENT:atraumatic, normocephalic Chest: B/L clear to auscultation anteriorly CVS:S1S2 regular Abdomen:soft non tender, non distended Extremities:no edema Neurology: Non focal Skin: no rash   Data Review:    CBC Recent Labs  Lab 04/07/20 0625 04/07/20 0625 04/10/20 0430 04/11/20 0159 04/12/20 0300 04/12/20 2200 04/13/20 0146  WBC 17.5*   < > 14.5* 15.6* 14.4* 12.8* 11.0*  HGB 14.4   < > 13.5 13.0 13.4 12.8 12.3  HCT 42.8   < > 39.4 38.0 40.0 38.1 35.8*  PLT 215   < > 174 162 162 145* 144*  MCV 87.7   < > 87.4 88.0 88.9 88.8 88.6  MCH 29.5   < > 29.9 30.1 29.8 29.8 30.4  MCHC 33.6   < > 34.3 34.2 33.5 33.6 34.4  RDW 14.0   < > 14.4 14.6 15.0 15.5 15.5  LYMPHSABS 0.8  --   --   --   --  1.2  --   MONOABS 0.6  --   --   --   --  0.5  --   EOSABS 0.0  --   --   --   --  0.1  --   BASOSABS 0.0  --   --   --   --  0.0  --    < > = values in this interval not displayed.    Chemistries  Recent Labs  Lab 04/07/20 0625 04/08/20 0237 04/10/20 0430 04/11/20 0159 04/12/20 0300 04/12/20 2200 04/13/20 0146  NA 138   < > 138 138 138 137  139  K 4.5   < > 4.1 4.0 4.8 4.1 3.9  CL 98   < > 99 103 100 103 104  CO2 29   < > 29 28 30 26 27   GLUCOSE 126*   < > 146* 138* 96 107* 89  BUN 49*   < > 53* 58* 52* 49* 44*  CREATININE 1.48*   < > 1.39* 1.39* 1.29* 1.38* 1.23*  CALCIUM 11.3*   < > 10.8* 10.9* 10.8* 10.3 10.1  MG  --   --   --  2.3  --   --   --   AST 18  --   --   --   --  25  --   ALT 28  --   --   --   --  51*  --   ALKPHOS 52  --   --   --   --  51  --   BILITOT 1.0  --   --   --   --  1.0  --    < > = values in this interval not displayed.   ------------------------------------------------------------------------------------------------------------------ No results for input(s): CHOL, HDL, LDLCALC, TRIG, CHOLHDL, LDLDIRECT in the last 72 hours.  Lab Results  Component Value Date   HGBA1C 5.5 02/19/2018   ------------------------------------------------------------------------------------------------------------------ No results for input(s): TSH, T4TOTAL, T3FREE, THYROIDAB in the last 72 hours.  Invalid input(s): FREET3 ------------------------------------------------------------------------------------------------------------------ No results for input(s): VITAMINB12, FOLATE, FERRITIN, TIBC, IRON, RETICCTPCT in the last 72 hours.  Coagulation profile No results for input(s): INR, PROTIME in the last 168 hours.  No results for input(s): DDIMER in the last 72 hours.  Cardiac Enzymes No results for input(s): CKMB, TROPONINI, MYOGLOBIN in the last 168 hours.  Invalid input(s): CK ------------------------------------------------------------------------------------------------------------------    Component Value Date/Time   BNP 105.3 (H) 04/07/2020 2671    Micro Results No results found for this or any previous visit (from the past 240 hour(s)).  Radiology Reports DG Chest 2 View  Result Date: 03/23/2020 CLINICAL DATA:  Shortness of breath EXAM: CHEST - 2 VIEW COMPARISON:  February 25, 2018  FINDINGS: There is mild scarring in the left mid lung region. There is interstitial thickening in portions of the mid and lower lung regions bilaterally. No edema or airspace opacity. Heart size and pulmonary vascular normal. There is aortic atherosclerosis. Postoperative change noted in right shoulder. There is degenerative change in the thoracic spine. IMPRESSION: Suspect a degree of underlying chronic bronchitis. Slight scarring left mid lung. No edema or airspace opacity. Stable cardiac silhouette. Aortic Atherosclerosis (ICD10-I70.0). Electronically Signed   By: Lowella Grip III M.D.   On: 03/23/2020 12:44   CT ANGIO CHEST PE W OR WO CONTRAST  Result Date: 03/25/2020 CLINICAL DATA:  Shortness of breath, covid positive EXAM: CT ANGIOGRAPHY CHEST WITH CONTRAST TECHNIQUE: Multidetector CT imaging of the chest was performed using the standard protocol during bolus administration of intravenous contrast. Multiplanar CT image reconstructions and MIPs were obtained to evaluate the vascular anatomy. CONTRAST:  16mL OMNIPAQUE IOHEXOL 350 MG/ML SOLN COMPARISON:  None. FINDINGS: Cardiovascular: There is a optimal opacification of the pulmonary arteries. There is no central,segmental, or subsegmental filling defects within the pulmonary arteries. The heart is normal in size. No pericardial effusion or thickening. No evidence right heart strain. There is normal three-vessel brachiocephalic anatomy without proximal stenosis. Scattered aortic atherosclerosis is noted. Mediastinum/Nodes: No hilar, mediastinal, or axillary adenopathy. Thyroid gland, trachea, and esophagus demonstrate no significant findings. Lungs/Pleura: Multifocal patchy airspace opacities are seen throughout both lungs, right greater than left. No pleural effusion or pneumothorax. Upper Abdomen: No acute abnormalities present in the visualized portions of the upper abdomen. Low-density lesions are seen within the left kidney and anterior left  hepatic lobe. Musculoskeletal: No chest wall abnormality. No acute or significant osseous findings. Review of the MIP images confirms the above findings. IMPRESSION: No central, segmental, or subsegmental pulmonary embolism. Multifocal patchy airspace opacities, right greater than left, consistent with multifocal viral pneumonia Electronically Signed   By: Prudencio Pair M.D.   On: 03/25/2020 03:14   DG Chest Port 1 View  Result Date: 04/03/2020 CLINICAL DATA:  Shortness of breath. EXAM: PORTABLE CHEST 1 VIEW COMPARISON:  03/31/2020 chest radiograph and prior. FINDINGS: Bilateral mid to lower lung patchy opacities are more conspicuous than prior exam. No pneumothorax or pleural effusion. Mild cardiomegaly and aortic arch atherosclerotic calcifications. Osseous structures are unchanged. IMPRESSION: Slightly increased conspicuity of bilateral pulmonary opacities concerning for multifocal pneumonia. Electronically Signed   By: Primitivo Gauze  M.D.   On: 04/03/2020 08:24   DG Chest Port 1 View  Result Date: 03/31/2020 CLINICAL DATA:  Shortness of breath. EXAM: PORTABLE CHEST 1 VIEW COMPARISON:  03/28/2020 FINDINGS: Mild cardiomegaly stable. Aortic atherosclerotic calcification noted. Pulmonary infiltrate is again seen at the right base, with mild worsening since previous study. There is also mild airspace disease in the peripheral left mid lung which is unchanged in appearance. No definite pleural effusion or pneumothorax. IMPRESSION: Mild worsening of right basilar pulmonary infiltrate. Stable mild airspace disease in peripheral left mid lung. Electronically Signed   By: Marlaine Hind M.D.   On: 03/31/2020 08:07   DG Chest Port 1 View  Result Date: 03/28/2020 CLINICAL DATA:  Shortness of breath.  Reported COVID-19 positive EXAM: PORTABLE CHEST 1 VIEW COMPARISON:  Chest CT March 25, 2020; chest radiograph March 24, 2020 FINDINGS: There is patchy airspace opacity in the mid and lower lung zones  bilaterally, most notable in the right lower lung region. There appears to be slightly less opacity in the upper lobes compared to recent CT. No consolidation. Heart is upper normal in size with pulmonary vascularity normal. No adenopathy. No bone lesions. There is aortic atherosclerosis. IMPRESSION: Multifocal airspace opacity, likely representing atypical organism multifocal pneumonia. Slightly less opacity in the upper lobes compared to recent CT examination. Other areas appear essentially stable. No new opacities evident. Stable cardiac silhouette.  No evident adenopathy. Aortic Atherosclerosis (ICD10-I70.0). Electronically Signed   By: Lowella Grip III M.D.   On: 03/28/2020 10:12   DG Chest Portable 1 View  Result Date: 03/24/2020 CLINICAL DATA:  Body aches and shortness of breath. EXAM: PORTABLE CHEST 1 VIEW COMPARISON:  Single-view of the chest 02/25/2018. PA and lateral chest 03/23/2020. FINDINGS: There is some coarsening of the pulmonary interstitium. No consolidative process, pneumothorax or effusion. Aortic atherosclerosis. No acute or focal bony abnormality. IMPRESSION: No acute disease. Aortic Atherosclerosis (ICD10-I70.0). Electronically Signed   By: Inge Rise M.D.   On: 03/24/2020 11:54   VAS [place] LOWER EXTREMITY VENOUS (DVT)  Result Date: 03/27/2020  Lower Venous DVTStudy Indications: COVID-19 positive. Elevated d-dimer.  Comparison Study: No prior study Performing Technologist: Maudry Mayhew MHA, RDMS, RVT, RDCS  Examination Guidelines: A complete evaluation includes B-mode imaging, spectral Doppler, color Doppler, and power Doppler as needed of all accessible portions of each vessel. Bilateral testing is considered an integral part of a complete examination. Limited examinations for reoccurring indications may be performed as noted. The reflux portion of the exam is performed with the patient in reverse Trendelenburg.   +---------+---------------+---------+-----------+----------+--------------+ RIGHT    CompressibilityPhasicitySpontaneityPropertiesThrombus Aging +---------+---------------+---------+-----------+----------+--------------+ CFV      Full           Yes      Yes                                 +---------+---------------+---------+-----------+----------+--------------+ SFJ      Full                                                        +---------+---------------+---------+-----------+----------+--------------+ FV Prox  Full                                                        +---------+---------------+---------+-----------+----------+--------------+  FV Mid   Full                                                        +---------+---------------+---------+-----------+----------+--------------+ FV DistalFull                                                        +---------+---------------+---------+-----------+----------+--------------+ PFV      Full                                                        +---------+---------------+---------+-----------+----------+--------------+ POP      Full           Yes      Yes                                 +---------+---------------+---------+-----------+----------+--------------+ PTV      Full                                                        +---------+---------------+---------+-----------+----------+--------------+ PERO     Full                                                        +---------+---------------+---------+-----------+----------+--------------+   +---------+---------------+---------+-----------+----------+--------------+ LEFT     CompressibilityPhasicitySpontaneityPropertiesThrombus Aging +---------+---------------+---------+-----------+----------+--------------+ CFV      Full           Yes      Yes                                  +---------+---------------+---------+-----------+----------+--------------+ SFJ      Full                                                        +---------+---------------+---------+-----------+----------+--------------+ FV Prox  Full                                                        +---------+---------------+---------+-----------+----------+--------------+ FV Mid   Full                                                        +---------+---------------+---------+-----------+----------+--------------+  FV DistalFull                                                        +---------+---------------+---------+-----------+----------+--------------+ PFV      Full                                                        +---------+---------------+---------+-----------+----------+--------------+ POP      Full           Yes      Yes                                 +---------+---------------+---------+-----------+----------+--------------+ PTV      Full                                                        +---------+---------------+---------+-----------+----------+--------------+ PERO     Full                                                        +---------+---------------+---------+-----------+----------+--------------+     Summary: RIGHT: - There is no evidence of deep vein thrombosis in the lower extremity.  - No cystic structure found in the popliteal fossa.  LEFT: - There is no evidence of deep vein thrombosis in the lower extremity.  - No cystic structure found in the popliteal fossa.  *See table(s) above for measurements and observations. Electronically signed by Servando Snare MD on 03/27/2020 at 11:33:24 AM.    Final    ECHOCARDIOGRAM LIMITED  Result Date: 04/06/2020    ECHOCARDIOGRAM LIMITED REPORT   Patient Name:   DONNAMARIA SHANDS Date of Exam: 04/06/2020 Medical Rec #:  [id number]         Height:       63.0 in Accession #:    [id number]         Weight:       225.0 lb Date of Birth:  1945-12-17         BSA:          2.033 m Patient Age:    37 years          BP:           111/75 mmHg Patient Gender: F                 HR:           96 bpm. Exam Location:  Inpatient Procedure: Limited Echo, Cardiac Doppler, Color Doppler and Intracardiac            Opacification Agent Indications:    Dyspnea  History:        Patient has no prior history of Echocardiogram examinations.  Risk Factors:Dyslipidemia and Former Smoker. COVID-19.  Sonographer:    Clayton Lefort RDCS (AE) Referring Phys: [id number] DAWOOD Graciela Husbands  Sonographer Comments: Technically challenging study due to limited acoustic windows, Technically difficult study due to poor echo windows, suboptimal parasternal window, suboptimal apical window, suboptimal subcostal window and patient is morbidly obese.  COVID-19. IMPRESSIONS  1. Left ventricular ejection fraction, by estimation, is 60 to 65%. The left ventricle has normal function.  2. Right ventricular systolic function was not well visualized. The right ventricular size is moderately enlarged.  3. The mitral valve was not well visualized.  4. The aortic valve was not well visualized.  5. The inferior vena cava is normal in size with <50% respiratory variability, suggesting right atrial pressure of 8 mmHg. Comparison(s): No prior Echocardiogram. Conclusion(s)/Recommendation(s): Technically challenging study with limited windows. Even with use of echo contrast, able to assess normal LVEF but limited for wall motion analysis. No significant valve disease by doppler, but not well visualized. FINDINGS  Left Ventricle: Left ventricular ejection fraction, by estimation, is 60 to 65%. The left ventricle has normal function. Definity contrast agent was given IV to delineate the left ventricular endocardial borders. Right Ventricle: The right ventricular size is moderately enlarged. Right vetricular wall thickness was not well visualized. Right  ventricular systolic function was not well visualized. Left Atrium: Left atrial size was not well visualized. Right Atrium: Right atrial size was not well visualized. Pericardium: The pericardium was not well visualized. Mitral Valve: The mitral valve was not well visualized. Tricuspid Valve: The tricuspid valve is not well visualized. Aortic Valve: The aortic valve was not well visualized. Pulmonic Valve: The pulmonic valve was not well visualized. Aorta: The aortic root was not well visualized, the ascending aorta was not well visualized and the aortic arch was not well visualized. Venous: The inferior vena cava is normal in size with less than 50% respiratory variability, suggesting right atrial pressure of 8 mmHg. LEFT VENTRICLE PLAX 2D LVOT diam:     2.10 cm  Diastology LVOT Area:     3.46 cm LV e' medial:    3.37 cm/s                         LV E/e' medial:  11.1                         LV e' lateral:   4.46 cm/s                         LV E/e' lateral: 8.4  IVC IVC diam: 1.70 cm  AORTA Ao Root diam: 3.30 cm Ao Asc diam:  3.30 cm MITRAL VALVE MV Area (PHT): 3.68 cm    SHUNTS MV Decel Time: 206 msec    Systemic Diam: 2.10 cm MV E velocity: 37.30 cm/s MV A velocity: 72.40 cm/s MV E/A ratio:  0.52 Buford Dresser MD Electronically signed by Buford Dresser MD Signature Date/Time: 04/06/2020/2:33:32 PM    Final

## 2020-04-13 NOTE — Progress Notes (Addendum)
Physical Therapy Treatment Patient Details Name: Amanda Ross MRN: [id number] DOB: [birth year] Today's Date: 04/13/2020    History of Present Illness Pt is 74 y.o. female with history of asthma, HTN, OSA, primary hypothyroidism and left thyroid nodule resented with myalgias, chills, intermittent cough) shortness of breath.  She tested positive for COVID-19 at her PCP office.  She apparently desaturated into the mid 80s and was sent over to the emergency department.  Chest x-ray did not show any significant findings.  CT angiogram did not show PE but did show multifocal patchy airspace opacities right greater than left. On 10/25 pt developed chest pain with EKG abnormalitie and elevated troponin.  Cardiology was consulted and are treated for acute coronary syndrome/NSTEMI.    PT Comments    Pt was not able to ambulate as far as last treatment; however, since that time pt dx with ACS/NSTEMI and is on 3-5 LPM O2 compared to 15 L HFNC at last visit.  Her sats decreased as low as 77% with increased time for recovery.  HR stable at 98-110 bpm and BP 105/74. Pt reports anxiousness and was cued for slow, deep, and pursed lip breathing.  Increased time for treatment due to extended breaks with vitals monitored.  Cont plan of care.    Follow Up Recommendations  Home health PT;Supervision/Assistance - 24 hour     Equipment Recommendations  Other (comment) (likely home O2; possible rollator)    Recommendations for Other Services       Precautions / Restrictions Precautions Precautions: Fall Precaution Comments: watch O2    Mobility  Bed Mobility Overal bed mobility: Needs Assistance Bed Mobility: Supine to Sit     Supine to sit: Supervision        Transfers Overall transfer level: Needs assistance Equipment used: 1 person hand held assist Transfers: Stand Pivot Transfers;Sit to/from Stand Sit to Stand: Min guard Stand pivot transfers: Min guard       General transfer comment:  Pt's Purewick was not working and she had urinated.  Multiple sit to stands at EOB to get clean and gown changed then stand pivot to chair.  Ambulation/Gait Ambulation/Gait assistance: Min guard Gait Distance (Feet): 6 Feet (6'x2) Assistive device: 1 person hand held assist Gait Pattern/deviations: Step-to pattern;Decreased stride length Gait velocity: decreased   General Gait Details: 6' steps to chair, rest break , then 6' back and forth.  Pt reports fatigue and difficulty breathing (but no chest pain) so was returned to seated position.   Stairs             Wheelchair Mobility    Modified Rankin (Stroke Patients Only)       Balance Overall balance assessment: Needs assistance Sitting-balance support: No upper extremity supported;Feet supported Sitting balance-Leahy Scale: Good     Standing balance support: Single extremity supported;During functional activity Standing balance-Leahy Scale: Poor Standing balance comment: Pt requiring HHA or steadying when donning underwear at EOB and HHA with gait                            Cognition Arousal/Alertness: Awake/alert Behavior During Therapy: WFL for tasks assessed/performed;Anxious Overall Cognitive Status: Within Functional Limits for tasks assessed                                 General Comments: Very pleasant. fatigued - reports anxious at times but did not  appear overly anxious      Exercises      General Comments General comments (skin integrity, edema, etc.): Pt was on 3 LPM O2 at arrival with sats 90%.  Sats down to 84% with initial transfers so increased to 5 LPM for activity.  Pt requiring 5-6 mins recover after transfer to chair before further ambulation.  Sats down to 77% with second bout of ambulation with O2 increased to 6 LPM for recovery.  Required cues for pursed lip breathing and relaxation.  Sats requiring 6 mins to fully recover to 89-90% but still required 5 LPM o2.   Notified RN left on 5 L O2.      Pertinent Vitals/Pain Pain Assessment: No/denies pain    Home Living                      Prior Function            PT Goals (current goals can now be found in the care plan section) Acute Rehab PT Goals Patient Stated Goal: go home soon PT Goal Formulation: With patient Time For Goal Achievement: 04/27/20 Potential to Achieve Goals: Good Progress towards PT goals: Progressing toward goals    Frequency    Min 3X/week      PT Plan Current plan remains appropriate    Co-evaluation              AM-PAC PT "6 Clicks" Mobility   Outcome Measure  Help needed turning from your back to your side while in a flat bed without using bedrails?: A Little Help needed moving from lying on your back to sitting on the side of a flat bed without using bedrails?: A Little Help needed moving to and from a bed to a chair (including a wheelchair)?: A Little Help needed standing up from a chair using your arms (e.g., wheelchair or bedside chair)?: A Little Help needed to walk in hospital room?: A Little Help needed climbing 3-5 steps with a railing? : A Lot 6 Click Score: 17    End of Session Equipment Utilized During Treatment: Oxygen Activity Tolerance: Patient tolerated treatment well Patient left: with call bell/phone within reach;in chair Nurse Communication: Mobility status PT Visit Diagnosis: Other abnormalities of gait and mobility (R26.89);Muscle weakness (generalized) (M62.81)     Time: 1103-1140 PT Time Calculation (min) (ACUTE ONLY): 37 min  Charges:  $Therapeutic Activity: 23-37 mins                     Abran Richard, PT Acute Rehab Services Pager [phone] [facility] [place] 04/13/2020, 1:24 PM

## 2020-04-14 DIAGNOSIS — J9601 Acute respiratory failure with hypoxia: Secondary | ICD-10-CM | POA: Diagnosis not present

## 2020-04-14 DIAGNOSIS — R778 Other specified abnormalities of plasma proteins: Secondary | ICD-10-CM | POA: Diagnosis not present

## 2020-04-14 DIAGNOSIS — U071 COVID-19: Secondary | ICD-10-CM | POA: Diagnosis not present

## 2020-04-14 LAB — BASIC METABOLIC PANEL
Anion gap: 8 (ref 5–15)
BUN: 40 mg/dL — ABNORMAL HIGH (ref 8–23)
CO2: 25 mmol/L (ref 22–32)
Calcium: 9.9 mg/dL (ref 8.9–10.3)
Chloride: 105 mmol/L (ref 98–111)
Creatinine, Ser: 1.47 mg/dL — ABNORMAL HIGH (ref 0.44–1.00)
GFR, Estimated: 37 mL/min — ABNORMAL LOW (ref >60)
Glucose, Bld: 103 mg/dL — ABNORMAL HIGH (ref 70–99)
Potassium: 3.8 mmol/L (ref 3.5–5.1)
Sodium: 138 mmol/L (ref 135–145)

## 2020-04-14 LAB — CBC
HCT: 38.9 % (ref 36.0–46.0)
Hemoglobin: 13.2 g/dL (ref 12.0–15.0)
MCH: 30.5 pg (ref 26.0–34.0)
MCHC: 33.9 g/dL (ref 30.0–36.0)
MCV: 89.8 fL (ref 80.0–100.0)
Platelets: 140 10*3/uL — ABNORMAL LOW (ref 150–400)
RBC: 4.33 MIL/uL (ref 3.87–5.11)
RDW: 15.9 % — ABNORMAL HIGH (ref 11.5–15.5)
WBC: 10.6 10*3/uL — ABNORMAL HIGH (ref 4.0–10.5)
nRBC: 0.3 % — ABNORMAL HIGH (ref 0.0–0.2)

## 2020-04-14 MED ORDER — SODIUM CHLORIDE 0.9% FLUSH
3.0000 mL | Freq: Two times a day (BID) | INTRAVENOUS | Status: DC
  Administered 2020-04-14 – 2020-04-16 (×4): 3 mL via INTRAVENOUS

## 2020-04-14 MED ORDER — SODIUM CHLORIDE 0.9% FLUSH: 3.0000 mL | INTRAVENOUS | Status: DC | PRN

## 2020-04-14 MED ORDER — SODIUM CHLORIDE 0.9 % IV SOLN: 250.0000 mL | INTRAVENOUS | Status: DC | PRN

## 2020-04-14 MED ORDER — SODIUM CHLORIDE 0.9 % IV SOLN: INTRAVENOUS | Status: DC

## 2020-04-14 NOTE — Progress Notes (Signed)
Occupational Therapy Treatment Patient Details Name: Amanda Ross MRN: [id number] DOB: [birth year] Today's Date: 04/14/2020    History of present illness Pt is 74 y.o. female with history of asthma, HTN, OSA, primary hypothyroidism and left thyroid nodule resented with myalgias, chills, intermittent cough) shortness of breath.  She tested positive for COVID-19 at her PCP office.  She apparently desaturated into the mid 80s and was sent over to the emergency department.  Chest x-ray did not show any significant findings.  CT angiogram did not show PE but did show multifocal patchy airspace opacities right greater than left. On 10/25 pt developed chest pain with EKG abnormalitie and elevated troponin.  Cardiology was consulted and are treated for acute coronary syndrome/NSTEMI.   OT comments  Pt continues to present with decreased activity tolerance. At rest, SpO2 92% on RA, HR 101, RR 20s. BP in supine 96/50; notified RN. Pt performing BLEs exercises in bed; requiring 2L to maintain SpO2 >88%, HR 112, and BP 105/59. Pt performing stand pivot transfer to recliner with Min A for single hand held A. Pt requiring 3L O2 for bed mobility and transfer to maintain SpO2 >85%. Recovering to 90s on 3L when seated in recliner. Continue recommend dc to home with HHOT and will continue to follow acutely as admitted.   Orthostatic BPs   Supine 96/50 (64)  Supine after BLE AROM 105/59 (75)  Sitting at EOB 109/71 (83)  Seated in recliner 102/59 (73)     Follow Up Recommendations  Home health OT;Supervision - Intermittent    Equipment Recommendations  Tub/shower bench    Recommendations for Other Services      Precautions / Restrictions Precautions Precautions: Fall Precaution Comments: watch O2       Mobility Bed Mobility Overal bed mobility: Needs Assistance Bed Mobility: Supine to Sit     Supine to sit: Supervision     General bed mobility comments: Supervision for  safety  Transfers Overall transfer level: Needs assistance Equipment used: 1 person hand held assist Transfers: Stand Pivot Transfers;Sit to/from Stand Sit to Stand: Min guard Stand pivot transfers: Min assist       General transfer comment: Min Guard A for safety. Min A for single hand held A for balance during pivot    Balance Overall balance assessment: Needs assistance Sitting-balance support: No upper extremity supported;Feet supported Sitting balance-Leahy Scale: Good     Standing balance support: Single extremity supported;During functional activity Standing balance-Leahy Scale: Poor Standing balance comment: Pt requiring HHA or steadying when donning underwear at EOB and HHA with gait                           ADL either performed or assessed with clinical judgement   ADL Overall ADL's : Needs assistance/impaired                         Toilet Transfer: Stand-pivot;BSC;Minimal assistance Toilet Transfer Details (indicate cue type and reason): Min A for single hadn held A to recliner         Functional mobility during ADLs: Minimal assistance (single hand held A) General ADL Comments: Pt presenting with decreased activity tolerance compare to prior sessions. Min A for single hand hold assist for balance.      Vision   Vision Assessment?: No apparent visual deficits   Perception     Praxis      Cognition Arousal/Alertness: Awake/alert Behavior During  Therapy: WFL for tasks assessed/performed;Anxious Overall Cognitive Status: Within Functional Limits for tasks assessed                                 General Comments: Very pleasant. fatigued - reports anxious at times but did not appear overly anxious        Exercises Exercises: General Lower Extremity General Exercises - Lower Extremity Ankle Circles/Pumps: AROM;Both;10 reps;Supine Hip Flexion/Marching: AROM;Strengthening;Both;10 reps;Supine Other Exercises Other  Exercises: Flutter valve; 10x   Shoulder Instructions       General Comments BP 96/50 and 82/71 in supine. Performing BLEs AROM and BP 105/59. BP sitting 109/71. BP after transfer to recliner 102/59. Rest: SpO2 92% on RA and HR 101. SpO2 during exercises SpO2 85% on RA; placed on 2L to recover to 90s. with activity, SpO2 91-87% on 3L, RR 20-30s, and HR 112-122.     Pertinent Vitals/ Pain       Pain Assessment: Faces Faces Pain Scale: No hurt Pain Location: chest muscles and ribs when breathing Pain Descriptors / Indicators: Aching;Sore;Discomfort Pain Intervention(s): Monitored during session;Limited activity within patient's tolerance;Repositioned  Home Living                                          Prior Functioning/Environment              Frequency  Min 2X/week        Progress Toward Goals  OT Goals(current goals can now be found in the care plan section)  Progress towards OT goals: Progressing toward goals  Acute Rehab OT Goals Patient Stated Goal: go home soon OT Goal Formulation: With patient Time For Goal Achievement: 04/11/20 Potential to Achieve Goals: Good ADL Goals Pt Will Transfer to Toilet: with modified independence;ambulating;regular height toilet Pt Will Perform Tub/Shower Transfer: Tub transfer;with modified independence;ambulating;shower seat Additional ADL Goal #1: Pt to demonstrate implementation of at least 2 energy conservation strategies during ADLs/IADLs Additional ADL Goal #2: Pt to demonstrate ability to complete ADL/IADL tasks >5-8 minutes without rest break in order to improve endurance  Plan Discharge plan remains appropriate    Co-evaluation                 AM-PAC OT "6 Clicks" Daily Activity     Outcome Measure   Help from another person eating meals?: None Help from another person taking care of personal grooming?: A Little Help from another person toileting, which includes using toliet, bedpan, or  urinal?: A Little Help from another person bathing (including washing, rinsing, drying)?: A Little Help from another person to put on and taking off regular upper body clothing?: None Help from another person to put on and taking off regular lower body clothing?: A Little 6 Click Score: 20    End of Session Equipment Utilized During Treatment: Oxygen (2-3L)  OT Visit Diagnosis: Other (comment);Unsteadiness on feet (R26.81);Other abnormalities of gait and mobility (R26.89);Muscle weakness (generalized) (M62.81) (decreased cardiopulmonary tolerance)   Activity Tolerance Patient tolerated treatment well   Patient Left in chair;with call bell/phone within reach   Nurse Communication Mobility status (Left on 3L O2)        Time: 1610-9604 OT Time Calculation (min): 33 min  Charges: OT General Charges $OT Visit: 1 Visit OT Treatments $Therapeutic Activity: 23-37 mins  Altie Savard MSOT, OTR/L  Acute Rehab Pager: [phone] Office: [place] 04/14/2020, 1:29 PM

## 2020-04-14 NOTE — Progress Notes (Signed)
   04/14/20 1150  Assess: MEWS Score  Temp 99.4 F (37.4 C)  BP 95/65  Pulse Rate (!) 101  ECG Heart Rate (!) 101  Resp (!) 29  SpO2 100 %  Assess: MEWS Score  MEWS Temp 0  MEWS Systolic 1  MEWS Pulse 1  MEWS RR 2  MEWS LOC 0  MEWS Score 4  MEWS Score Color Red  Assess: if the MEWS score is Yellow or Red  Were vital signs taken at a resting state? Yes  Focused Assessment No change from prior assessment  Early Detection of Sepsis Score *See Row Information* Medium  MEWS guidelines implemented *See Row Information* Yes  Treat  MEWS Interventions Administered scheduled meds/treatments  Pain Scale 0-10  Pain Score 0  Breathing 1  Negative Vocalization 0  Body Language 0  Take Vital Signs  Increase Vital Sign Frequency  Red: Q 1hr X 4 then Q 4hr X 4, if remains red, continue Q 4hrs  Escalate  MEWS: Escalate Red: discuss with charge nurse/RN and provider, consider discussing with RRT  Notify: Charge Nurse/RN  Name of Charge Nurse/RN Notified Judson Roch  Date Charge Nurse/RN Notified 04/14/20  Time Charge Nurse/RN Notified [id number]  Notify: Provider  Provider Name/Title Dr. Sloan Leiter  Date Provider Notified 04/14/20  Time Provider Notified 0045  Notification Type Page  Notification Reason Other (Comment) (Per protocol )  Response No new orders  Date of Provider Response 04/14/20  Time of Provider Response 1225

## 2020-04-14 NOTE — Progress Notes (Signed)
PROGRESS NOTE                                                                                                                                                                                                             Patient Demographics:    Amanda Ross, is a 74 y.o. female, DOB - 10-05-1945, HDQ:[id number]  Outpatient Primary MD for the patient is [name], MD   Admit date - 03/24/2020   LOS - 21  Chief Complaint  Patient presents with  . Generalized Body Aches  . Shortness of Breath       Brief Narrative: Patient is a 74 y.o. female with PMHx of Sjogren's syndrome, HTN, asthma, left thyroid nodule-presented to the ED on 10/7 with acute hypoxic respiratory failure due to COVID-19 pneumonia.  On initial presentation-had mild hypoxemia-however developed worsening hypoxemia requiring HFNC.  COVID-19 vaccinated status: Vaccinated  Significant Events: 10/7>> Admit to [facility] for mild hypoxemia due to COVID-19 pneumonia 10/12>> worsening hypoxemia-s/p Actemra 10/25>> chest pain requiring cardiology consultation  Significant studies: 10/7>>Chest x-ray: No acute disease 10/8>> CTA chest: No PE, multifocal pneumonia 10/9>> lower extremity Doppler: No DVT 10/11>> chest x-ray: Multifocal airspace opacities 10/17>> bilateral pulmonary opacities 10/20>> Echo: EF 60-65%  COVID-19 medications: Steroids: 10/7>>10/26 Remdesivir: 10/7>> 10/11 Actemra: 10/12 x 1  Antibiotics: None  Microbiology data: 10/7 >>blood culture: 1/2  staph epidermidis 10/8>> blood culture: No growth  Procedures: None  Consults: Cardiology  DVT prophylaxis: heparin injection 5,000 Units Start: 04/13/20 1400    Subjective:   No major issues-Down to 2 L of oxygen this morning.  No chest pain.  Denies any shortness of breath at rest.   Assessment  & Plan :   Acute Hypoxic Resp Failure due to Covid 19 Viral pneumonia:  Improving-Down to 2 L of oxygen-has been titrated off steroids-no signs of volume overload.  Continue to attempt to titrate off oxygen.  Has completed more than 21 days of isolation-clinically improved-hypoxia resolving-should be okay to transition out of 5 W. today.  Fever: afebrile O2 requirements:  SpO2: 100 % O2 Flow Rate (L/min): 2 L/min FiO2 (%): 70 %   COVID-19 Labs: No results for input(s): DDIMER, FERRITIN, LDH, CRP in the last 72 hours.     Component Value Date/Time   BNP 105.3 (H) 04/07/2020 1194    No results for  input(s): PROCALCITON in the last 168 hours.  Lab Results  Component Value Date   SARSCOV2NAA POSITIVE (A) 03/24/2020   SARSCOV2NAA Detected (A) 03/24/2020     Prone/Incentive Spirometry: encouraged  incentive spirometry use 3-4/hour.  Non-STEMI: No further chest pain-maintain on aspirin/statin/beta-blocker-spoke with cardiology-Dr. Venancio Poisson plan on [facility] tomorrow or day after tomorrow.    AKI on CKD stage IIIb: AKI hemodynamically mediated-improved-slight bump in creatinine today but still close to usual baseline.  Asthma: Stable-continue bronchodilators.  HTN: BP soft this morning-cautiously continue with metoprolol with holding parameters-continue to hold losartan/HCTZ.  Follow and adjust.    History of overactive bladder: Continue oxybutynin  History of Sjogren's syndrome: Resume Plaquenil-on artificial tears.  Thrombocytopenia: Mild-stable for follow-up without further work-up.  OSA: On oxygen-not sure if she uses CPAP nightly at home.    Nutrition Problem: Nutrition Problem: Increased nutrient needs Etiology: catabolic illness ([id number]) Signs/Symptoms: estimated needs Interventions: Liberalize Diet, Boost Breeze, Prostat  Morbid Obesity: Estimated body mass index is 37.53 kg/m as calculated from the following:   Height as of this encounter: 5\' 3"  (1.6 m).   Weight as of this encounter: 96.1 kg.    GI prophylaxis:  PPI  ABG: No results found for: PHART, PCO2ART, PO2ART, HCO3, TCO2, ACIDBASEDEF, O2SAT  Vent Settings: N/A  Condition - Stable  Family Communication  :  Spouse-James-[phone] updated over the phone on 10/28  Code Status :  Full Code  Diet :  Diet Order            Diet Heart Room service appropriate? Yes; Fluid consistency: Thin  Diet effective now                  Disposition Plan  :   Status is: Inpatient  Remains inpatient appropriate because:Inpatient level of care appropriate due to severity of illness   Dispo:  Patient From: Home  Planned Disposition: Home  Expected discharge date: 04/15/20  Medically stable for discharge: No    Barriers to discharge: Hypoxia requiring O2 supplementation  Antimicorbials  :    Anti-infectives (From admission, onward)   Start     Dose/Rate Route Frequency Ordered Stop   04/14/20 1000  hydroxychloroquine (PLAQUENIL) tablet 200 mg        200 mg Oral  Every morning - 10a 04/13/20 1514     03/25/20 1000  remdesivir 100 mg in sodium chloride 0.9 % 100 mL IVPB       "Followed by" Linked Group Details   100 mg 200 mL/hr over 30 Minutes Intravenous Daily 03/24/20 1533 03/28/20 0900   03/24/20 1545  remdesivir 200 mg in sodium chloride 0.9% 250 mL IVPB       "Followed by" Linked Group Details   200 mg 580 mL/hr over 30 Minutes Intravenous Once 03/24/20 1533 03/24/20 1940      Inpatient Medications  Scheduled Meds: . (feeding supplement) PROSource Plus  30 mL Oral BID BM  . vitamin C  500 mg Oral Daily  . aspirin EC  81 mg Oral Daily  . atorvastatin  10 mg Oral Daily  . cholecalciferol  2,000 Units Oral Daily  . diclofenac Sodium  2 g Topical BID  . feeding supplement  1 Container Oral TID BM  . heparin  5,000 Units Subcutaneous Q8H  . hydroxychloroquine  200 mg Oral q morning - 10a  . mouth rinse  15 mL Mouth Rinse BID  . metoprolol tartrate  12.5 mg Oral BID  . oxybutynin  10 mg  Oral QHS  . pantoprazole  40 mg Oral  Daily  . traZODone  50 mg Oral QHS  . zinc sulfate  220 mg Oral Daily   Continuous Infusions: PRN Meds:.acetaminophen, baclofen, bisacodyl, chlorpheniramine-HYDROcodone, guaiFENesin-dextromethorphan, hydrALAZINE, hydroxypropyl methylcellulose / hypromellose, Ipratropium-Albuterol, magnesium citrate, morphine injection, nitroGLYCERIN, [DISCONTINUED] ondansetron **OR** ondansetron (ZOFRAN) IV, polyethylene glycol, simethicone, sodium chloride, zolpidem   Time Spent in minutes  25  See all Orders from today for further details   Oren Binet M.D on 04/14/2020 at 11:56 AM  To page go to [email or web address] - use universal password  [facility] -  Office  [phone]    Objective:   Vitals:   04/14/20 0935 04/14/20 1000 04/14/20 1015 04/14/20 1150  BP: (!) 101/57 (!) 96/50 (!) 102/59 95/65  Pulse:  (!) 107  (!) 101  Resp: (!) 22   (!) 29  Temp: 97.7 F (36.5 C)   99.4 F (37.4 C)  TempSrc: Oral   Oral  SpO2: 90% 95%  100%  Weight:      Height:        Wt Readings from Last 3 Encounters:  04/11/20 96.1 kg  03/02/20 105.7 kg  03/02/20 101.2 kg    No intake or output data in the 24 hours ending 04/14/20 1156   Physical Exam Gen Exam:Alert awake-not in any distress HEENT:atraumatic, normocephalic Chest: B/L clear to auscultation anteriorly CVS:S1S2 regular Abdomen:soft non tender, non distended Extremities:no edema Neurology: Non focal Skin: no rash   Data Review:    CBC Recent Labs  Lab 04/11/20 0159 04/12/20 0300 04/12/20 2200 04/13/20 0146 04/14/20 0034  WBC 15.6* 14.4* 12.8* 11.0* 10.6*  HGB 13.0 13.4 12.8 12.3 13.2  HCT 38.0 40.0 38.1 35.8* 38.9  PLT 162 162 145* 144* 140*  MCV 88.0 88.9 88.8 88.6 89.8  MCH 30.1 29.8 29.8 30.4 30.5  MCHC 34.2 33.5 33.6 34.4 33.9  RDW 14.6 15.0 15.5 15.5 15.9*  LYMPHSABS  --   --  1.2  --   --   MONOABS  --   --  0.5  --   --   EOSABS  --   --  0.1  --   --   BASOSABS  --   --  0.0  --   --     Chemistries   Recent Labs  Lab 04/11/20 0159 04/12/20 0300 04/12/20 2200 04/13/20 0146 04/14/20 0034  NA 138 138 137 139 138  K 4.0 4.8 4.1 3.9 3.8  CL 103 100 103 104 105  CO2 28 30 26 27 25   GLUCOSE 138* 96 107* 89 103*  BUN 58* 52* 49* 44* 40*  CREATININE 1.39* 1.29* 1.38* 1.23* 1.47*  CALCIUM 10.9* 10.8* 10.3 10.1 9.9  MG 2.3  --   --   --   --   AST  --   --  25  --   --   ALT  --   --  51*  --   --   ALKPHOS  --   --  51  --   --   BILITOT  --   --  1.0  --   --    ------------------------------------------------------------------------------------------------------------------ No results for input(s): CHOL, HDL, LDLCALC, TRIG, CHOLHDL, LDLDIRECT in the last 72 hours.  Lab Results  Component Value Date   HGBA1C 5.5 02/19/2018   ------------------------------------------------------------------------------------------------------------------ No results for input(s): TSH, T4TOTAL, T3FREE, THYROIDAB in the last 72 hours.  Invalid input(s): FREET3 ------------------------------------------------------------------------------------------------------------------ No results for input(s): VITAMINB12, FOLATE, FERRITIN,  TIBC, IRON, RETICCTPCT in the last 72 hours.  Coagulation profile No results for input(s): INR, PROTIME in the last 168 hours.  No results for input(s): DDIMER in the last 72 hours.  Cardiac Enzymes No results for input(s): CKMB, TROPONINI, MYOGLOBIN in the last 168 hours.  Invalid input(s): CK ------------------------------------------------------------------------------------------------------------------    Component Value Date/Time   BNP 105.3 (H) 04/07/2020 1027    Micro Results No results found for this or any previous visit (from the past 240 hour(s)).  Radiology Reports DG Chest 2 View  Result Date: 03/23/2020 CLINICAL DATA:  Shortness of breath EXAM: CHEST - 2 VIEW COMPARISON:  February 25, 2018 FINDINGS: There is mild scarring in the left mid lung  region. There is interstitial thickening in portions of the mid and lower lung regions bilaterally. No edema or airspace opacity. Heart size and pulmonary vascular normal. There is aortic atherosclerosis. Postoperative change noted in right shoulder. There is degenerative change in the thoracic spine. IMPRESSION: Suspect a degree of underlying chronic bronchitis. Slight scarring left mid lung. No edema or airspace opacity. Stable cardiac silhouette. Aortic Atherosclerosis (ICD10-I70.0). Electronically Signed   By: Lowella Grip III M.D.   On: 03/23/2020 12:44   CT ANGIO CHEST PE W OR WO CONTRAST  Result Date: 03/25/2020 CLINICAL DATA:  Shortness of breath, covid positive EXAM: CT ANGIOGRAPHY CHEST WITH CONTRAST TECHNIQUE: Multidetector CT imaging of the chest was performed using the standard protocol during bolus administration of intravenous contrast. Multiplanar CT image reconstructions and MIPs were obtained to evaluate the vascular anatomy. CONTRAST:  185mL OMNIPAQUE IOHEXOL 350 MG/ML SOLN COMPARISON:  None. FINDINGS: Cardiovascular: There is a optimal opacification of the pulmonary arteries. There is no central,segmental, or subsegmental filling defects within the pulmonary arteries. The heart is normal in size. No pericardial effusion or thickening. No evidence right heart strain. There is normal three-vessel brachiocephalic anatomy without proximal stenosis. Scattered aortic atherosclerosis is noted. Mediastinum/Nodes: No hilar, mediastinal, or axillary adenopathy. Thyroid gland, trachea, and esophagus demonstrate no significant findings. Lungs/Pleura: Multifocal patchy airspace opacities are seen throughout both lungs, right greater than left. No pleural effusion or pneumothorax. Upper Abdomen: No acute abnormalities present in the visualized portions of the upper abdomen. Low-density lesions are seen within the left kidney and anterior left hepatic lobe. Musculoskeletal: No chest wall abnormality.  No acute or significant osseous findings. Review of the MIP images confirms the above findings. IMPRESSION: No central, segmental, or subsegmental pulmonary embolism. Multifocal patchy airspace opacities, right greater than left, consistent with multifocal viral pneumonia Electronically Signed   By: Prudencio Pair M.D.   On: 03/25/2020 03:14   DG Chest Port 1 View  Result Date: 04/03/2020 CLINICAL DATA:  Shortness of breath. EXAM: PORTABLE CHEST 1 VIEW COMPARISON:  03/31/2020 chest radiograph and prior. FINDINGS: Bilateral mid to lower lung patchy opacities are more conspicuous than prior exam. No pneumothorax or pleural effusion. Mild cardiomegaly and aortic arch atherosclerotic calcifications. Osseous structures are unchanged. IMPRESSION: Slightly increased conspicuity of bilateral pulmonary opacities concerning for multifocal pneumonia. Electronically Signed   By: Primitivo Gauze M.D.   On: 04/03/2020 08:24   DG Chest Port 1 View  Result Date: 03/31/2020 CLINICAL DATA:  Shortness of breath. EXAM: PORTABLE CHEST 1 VIEW COMPARISON:  03/28/2020 FINDINGS: Mild cardiomegaly stable. Aortic atherosclerotic calcification noted. Pulmonary infiltrate is again seen at the right base, with mild worsening since previous study. There is also mild airspace disease in the peripheral left mid lung which is unchanged in appearance. No definite  pleural effusion or pneumothorax. IMPRESSION: Mild worsening of right basilar pulmonary infiltrate. Stable mild airspace disease in peripheral left mid lung. Electronically Signed   By: Marlaine Hind M.D.   On: 03/31/2020 08:07   DG Chest Port 1 View  Result Date: 03/28/2020 CLINICAL DATA:  Shortness of breath.  Reported COVID-19 positive EXAM: PORTABLE CHEST 1 VIEW COMPARISON:  Chest CT March 25, 2020; chest radiograph March 24, 2020 FINDINGS: There is patchy airspace opacity in the mid and lower lung zones bilaterally, most notable in the right lower lung region. There  appears to be slightly less opacity in the upper lobes compared to recent CT. No consolidation. Heart is upper normal in size with pulmonary vascularity normal. No adenopathy. No bone lesions. There is aortic atherosclerosis. IMPRESSION: Multifocal airspace opacity, likely representing atypical organism multifocal pneumonia. Slightly less opacity in the upper lobes compared to recent CT examination. Other areas appear essentially stable. No new opacities evident. Stable cardiac silhouette.  No evident adenopathy. Aortic Atherosclerosis (ICD10-I70.0). Electronically Signed   By: Lowella Grip III M.D.   On: 03/28/2020 10:12   DG Chest Portable 1 View  Result Date: 03/24/2020 CLINICAL DATA:  Body aches and shortness of breath. EXAM: PORTABLE CHEST 1 VIEW COMPARISON:  Single-view of the chest 02/25/2018. PA and lateral chest 03/23/2020. FINDINGS: There is some coarsening of the pulmonary interstitium. No consolidative process, pneumothorax or effusion. Aortic atherosclerosis. No acute or focal bony abnormality. IMPRESSION: No acute disease. Aortic Atherosclerosis (ICD10-I70.0). Electronically Signed   By: Inge Rise M.D.   On: 03/24/2020 11:54   VAS [place] LOWER EXTREMITY VENOUS (DVT)  Result Date: 03/27/2020  Lower Venous DVTStudy Indications: COVID-19 positive. Elevated d-dimer.  Comparison Study: No prior study Performing Technologist: Maudry Mayhew MHA, RDMS, RVT, RDCS  Examination Guidelines: A complete evaluation includes B-mode imaging, spectral Doppler, color Doppler, and power Doppler as needed of all accessible portions of each vessel. Bilateral testing is considered an integral part of a complete examination. Limited examinations for reoccurring indications may be performed as noted. The reflux portion of the exam is performed with the patient in reverse Trendelenburg.  +---------+---------------+---------+-----------+----------+--------------+ RIGHT     CompressibilityPhasicitySpontaneityPropertiesThrombus Aging +---------+---------------+---------+-----------+----------+--------------+ CFV      Full           Yes      Yes                                 +---------+---------------+---------+-----------+----------+--------------+ SFJ      Full                                                        +---------+---------------+---------+-----------+----------+--------------+ FV Prox  Full                                                        +---------+---------------+---------+-----------+----------+--------------+ FV Mid   Full                                                        +---------+---------------+---------+-----------+----------+--------------+  FV DistalFull                                                        +---------+---------------+---------+-----------+----------+--------------+ PFV      Full                                                        +---------+---------------+---------+-----------+----------+--------------+ POP      Full           Yes      Yes                                 +---------+---------------+---------+-----------+----------+--------------+ PTV      Full                                                        +---------+---------------+---------+-----------+----------+--------------+ PERO     Full                                                        +---------+---------------+---------+-----------+----------+--------------+   +---------+---------------+---------+-----------+----------+--------------+ LEFT     CompressibilityPhasicitySpontaneityPropertiesThrombus Aging +---------+---------------+---------+-----------+----------+--------------+ CFV      Full           Yes      Yes                                 +---------+---------------+---------+-----------+----------+--------------+ SFJ      Full                                                         +---------+---------------+---------+-----------+----------+--------------+ FV Prox  Full                                                        +---------+---------------+---------+-----------+----------+--------------+ FV Mid   Full                                                        +---------+---------------+---------+-----------+----------+--------------+ FV DistalFull                                                        +---------+---------------+---------+-----------+----------+--------------+  PFV      Full                                                        +---------+---------------+---------+-----------+----------+--------------+ POP      Full           Yes      Yes                                 +---------+---------------+---------+-----------+----------+--------------+ PTV      Full                                                        +---------+---------------+---------+-----------+----------+--------------+ PERO     Full                                                        +---------+---------------+---------+-----------+----------+--------------+     Summary: RIGHT: - There is no evidence of deep vein thrombosis in the lower extremity.  - No cystic structure found in the popliteal fossa.  LEFT: - There is no evidence of deep vein thrombosis in the lower extremity.  - No cystic structure found in the popliteal fossa.  *See table(s) above for measurements and observations. Electronically signed by Servando Snare MD on 03/27/2020 at 11:33:24 AM.    Final    ECHOCARDIOGRAM LIMITED  Result Date: 04/06/2020    ECHOCARDIOGRAM LIMITED REPORT   Patient Name:   Amanda Ross Date of Exam: 04/06/2020 Medical Rec #:  [id number]         Height:       63.0 in Accession #:    [id number]        Weight:       225.0 lb Date of Birth:  07/01/1945         BSA:          2.033 m Patient Age:    20 years          BP:           111/75 mmHg  Patient Gender: F                 HR:           96 bpm. Exam Location:  Inpatient Procedure: Limited Echo, Cardiac Doppler, Color Doppler and Intracardiac            Opacification Agent Indications:    Dyspnea  History:        Patient has no prior history of Echocardiogram examinations.                 Risk Factors:Dyslipidemia and Former Smoker. COVID-19.  Sonographer:    Clayton Lefort RDCS (AE) Referring Phys: [id number] DAWOOD Graciela Husbands  Sonographer Comments: Technically challenging study due to limited acoustic windows, Technically difficult study due to poor echo windows, suboptimal parasternal window, suboptimal apical window, suboptimal subcostal window and patient is morbidly obese.  COVID-19. IMPRESSIONS  1. Left ventricular ejection fraction, by estimation, is 60 to 65%. The left ventricle has normal function.  2. Right ventricular systolic function was not well visualized. The right ventricular size is moderately enlarged.  3. The mitral valve was not well visualized.  4. The aortic valve was not well visualized.  5. The inferior vena cava is normal in size with <50% respiratory variability, suggesting right atrial pressure of 8 mmHg. Comparison(s): No prior Echocardiogram. Conclusion(s)/Recommendation(s): Technically challenging study with limited windows. Even with use of echo contrast, able to assess normal LVEF but limited for wall motion analysis. No significant valve disease by doppler, but not well visualized. FINDINGS  Left Ventricle: Left ventricular ejection fraction, by estimation, is 60 to 65%. The left ventricle has normal function. Definity contrast agent was given IV to delineate the left ventricular endocardial borders. Right Ventricle: The right ventricular size is moderately enlarged. Right vetricular wall thickness was not well visualized. Right ventricular systolic function was not well visualized. Left Atrium: Left atrial size was not well visualized. Right Atrium: Right atrial size was not  well visualized. Pericardium: The pericardium was not well visualized. Mitral Valve: The mitral valve was not well visualized. Tricuspid Valve: The tricuspid valve is not well visualized. Aortic Valve: The aortic valve was not well visualized. Pulmonic Valve: The pulmonic valve was not well visualized. Aorta: The aortic root was not well visualized, the ascending aorta was not well visualized and the aortic arch was not well visualized. Venous: The inferior vena cava is normal in size with less than 50% respiratory variability, suggesting right atrial pressure of 8 mmHg. LEFT VENTRICLE PLAX 2D LVOT diam:     2.10 cm  Diastology LVOT Area:     3.46 cm LV e' medial:    3.37 cm/s                         LV E/e' medial:  11.1                         LV e' lateral:   4.46 cm/s                         LV E/e' lateral: 8.4  IVC IVC diam: 1.70 cm  AORTA Ao Root diam: 3.30 cm Ao Asc diam:  3.30 cm MITRAL VALVE MV Area (PHT): 3.68 cm    SHUNTS MV Decel Time: 206 msec    Systemic Diam: 2.10 cm MV E velocity: 37.30 cm/s MV A velocity: 72.40 cm/s MV E/A ratio:  0.52 Buford Dresser MD Electronically signed by Buford Dresser MD Signature Date/Time: 04/06/2020/2:33:32 PM    Final

## 2020-04-14 NOTE — Consult Note (Signed)
Ref: [name], MD   Subjective:  Awake. No chest pain. VS stable.  Objective:  Vital Signs in the last 24 hours: Temp:  [97.7 F (36.5 C)-99.7 F (37.6 C)] 99.7 F (37.6 C) (10/28 1724) Pulse Rate:  [87-112] 101 (10/28 1724) Cardiac Rhythm: Normal sinus rhythm (10/28 1545) Resp:  [20-29] 20 (10/28 1724) BP: (84-109)/(50-74) 104/69 (10/28 1724) SpO2:  [86 %-100 %] 92 % (10/28 1724)  Physical Exam: BP Readings from Last 1 Encounters:  04/14/20 104/69     Wt Readings from Last 1 Encounters:  04/11/20 96.1 kg    Weight change:  Body mass index is 37.53 kg/m. HEENT: [place]/AT, Eyes-Brown, PERL, EOMI, Conjunctiva-Pink, Sclera-Non-icteric Neck: No JVD, No bruit, Trachea midline. Lungs:  Clear, Bilateral. Cardiac:  Regular rhythm, normal S1 and S2, no S3. II/VI systolic murmur. Abdomen:  Soft, non-tender. BS present. Extremities:  Trace edema present. No cyanosis. No clubbing. CNS: AxOx3, Cranial nerves grossly intact, moves all 4 extremities.  Skin: Warm and dry.   Intake/Output from previous day: 10/27 0701 - 10/28 0700 In: 319.2 [P.O.:300; I.V.:19.2] Out: -     Lab Results: BMET    Component Value Date/Time   NA 138 04/14/2020 0034   NA 139 04/13/2020 0146   NA 137 04/12/2020 2200   NA 144 03/02/2020 1204   NA 143 07/29/2019 1521   NA 141 03/24/2019 1509   K 3.8 04/14/2020 0034   K 3.9 04/13/2020 0146   K 4.1 04/12/2020 2200   CL 105 04/14/2020 0034   CL 104 04/13/2020 0146   CL 103 04/12/2020 2200   CO2 25 04/14/2020 0034   CO2 27 04/13/2020 0146   CO2 26 04/12/2020 2200   GLUCOSE 103 (H) 04/14/2020 0034   GLUCOSE 89 04/13/2020 0146   GLUCOSE 107 (H) 04/12/2020 2200   BUN 40 (H) 04/14/2020 0034   BUN 44 (H) 04/13/2020 0146   BUN 49 (H) 04/12/2020 2200   BUN 19 03/02/2020 1204   BUN 16 07/29/2019 1521   BUN 19 03/24/2019 1509   CREATININE 1.47 (H) 04/14/2020 0034   CREATININE 1.23 (H) 04/13/2020 0146   CREATININE 1.38 (H) 04/12/2020 2200    CREATININE 1.37 (H) 10/21/2019 1126   CREATININE 1.16 (H) 12/15/2018 1459   CREATININE 1.17 (H) 01/28/2018 1234   CALCIUM 9.9 04/14/2020 0034   CALCIUM 10.1 04/13/2020 0146   CALCIUM 10.3 04/12/2020 2200   GFRNONAA 37 (L) 04/14/2020 0034   GFRNONAA 46 (L) 04/13/2020 0146   GFRNONAA 40 (L) 04/12/2020 2200   GFRNONAA 38 (L) 10/21/2019 1126   GFRNONAA 47 (L) 12/15/2018 1459   GFRNONAA 47 (L) 01/28/2018 1234   GFRAA 48 (L) 03/02/2020 1204   GFRAA 44 (L) 10/21/2019 1126   GFRAA 52 (L) 07/29/2019 1521   GFRAA 48 (L) 03/24/2019 1509   GFRAA 54 (L) 12/15/2018 1459   GFRAA 54 (L) 01/28/2018 1234   CBC    Component Value Date/Time   WBC 10.6 (H) 04/14/2020 0034   RBC 4.33 04/14/2020 0034   HGB 13.2 04/14/2020 0034   HCT 38.9 04/14/2020 0034   PLT 140 (L) 04/14/2020 0034   MCV 89.8 04/14/2020 0034   MCH 30.5 04/14/2020 0034   MCHC 33.9 04/14/2020 0034   RDW 15.9 (H) 04/14/2020 0034   LYMPHSABS 1.2 04/12/2020 2200   MONOABS 0.5 04/12/2020 2200   EOSABS 0.1 04/12/2020 2200   BASOSABS 0.0 04/12/2020 2200   HEPATIC Function Panel Recent Labs    04/06/20 0357 04/07/20 5643  04/12/20 2200  PROT 5.3* 5.7* 4.8*   HEMOGLOBIN A1C No components found for: HGA1C,  MPG CARDIAC ENZYMES No results found for: CKTOTAL, CKMB, CKMBINDEX, TROPONINI BNP No results for input(s): PROBNP in the last 8760 hours. TSH No results for input(s): TSH in the last 8760 hours. CHOLESTEROL Recent Labs    03/02/20 1204  CHOL 161    Scheduled Meds: . (feeding supplement) PROSource Plus  30 mL Oral BID BM  . vitamin C  500 mg Oral Daily  . aspirin EC  81 mg Oral Daily  . atorvastatin  10 mg Oral Daily  . cholecalciferol  2,000 Units Oral Daily  . diclofenac Sodium  2 g Topical BID  . feeding supplement  1 Container Oral TID BM  . heparin  5,000 Units Subcutaneous Q8H  . hydroxychloroquine  200 mg Oral q morning - 10a  . mouth rinse  15 mL Mouth Rinse BID  . metoprolol tartrate  12.5 mg Oral BID   . oxybutynin  10 mg Oral QHS  . pantoprazole  40 mg Oral Daily  . sodium chloride flush  3 mL Intravenous Q12H  . traZODone  50 mg Oral QHS  . zinc sulfate  220 mg Oral Daily   Continuous Infusions: PRN Meds:.acetaminophen, baclofen, bisacodyl, chlorpheniramine-HYDROcodone, guaiFENesin-dextromethorphan, hydrALAZINE, hydroxypropyl methylcellulose / hypromellose, Ipratropium-Albuterol, magnesium citrate, morphine injection, nitroGLYCERIN, [DISCONTINUED] ondansetron **OR** ondansetron (ZOFRAN) IV, polyethylene glycol, simethicone, sodium chloride, zolpidem  Assessment/Plan: NSTEMI Acute on chronic respiratory failure with hypoxia COVID-19 pneumonia Hypothyroidism Obesity  Cardiac cath in AM. Discussed procedure, risks and alternatives. Patient agreeable to the procedure.   LOS: 21 days   Time spent including chart review, lab review, examination, discussion with patient, nurse and referring doctor : 30 min   Dixie Dials  MD  04/14/2020, 5:44 PM

## 2020-04-14 NOTE — H&P (View-Only) (Signed)
Ref: [name], MD   Subjective:  Awake. No chest pain. VS stable.  Objective:  Vital Signs in the last 24 hours: Temp:  [97.7 F (36.5 C)-99.7 F (37.6 C)] 99.7 F (37.6 C) (10/28 1724) Pulse Rate:  [87-112] 101 (10/28 1724) Cardiac Rhythm: Normal sinus rhythm (10/28 1545) Resp:  [20-29] 20 (10/28 1724) BP: (84-109)/(50-74) 104/69 (10/28 1724) SpO2:  [86 %-100 %] 92 % (10/28 1724)  Physical Exam: BP Readings from Last 1 Encounters:  04/14/20 104/69     Wt Readings from Last 1 Encounters:  04/11/20 96.1 kg    Weight change:  Body mass index is 37.53 kg/m. HEENT: /AT, Eyes-Brown, PERL, EOMI, Conjunctiva-Pink, Sclera-Non-icteric Neck: No JVD, No bruit, Trachea midline. Lungs:  Clear, Bilateral. Cardiac:  Regular rhythm, normal S1 and S2, no S3. II/VI systolic murmur. Abdomen:  Soft, non-tender. BS present. Extremities:  Trace edema present. No cyanosis. No clubbing. CNS: AxOx3, Cranial nerves grossly intact, moves all 4 extremities.  Skin: Warm and dry.   Intake/Output from previous day: 10/27 0701 - 10/28 0700 In: 319.2 [P.O.:300; I.V.:19.2] Out: -     Lab Results: BMET    Component Value Date/Time   NA 138 04/14/2020 0034   NA 139 04/13/2020 0146   NA 137 04/12/2020 2200   NA 144 03/02/2020 1204   NA 143 07/29/2019 1521   NA 141 03/24/2019 1509   K 3.8 04/14/2020 0034   K 3.9 04/13/2020 0146   K 4.1 04/12/2020 2200   CL 105 04/14/2020 0034   CL 104 04/13/2020 0146   CL 103 04/12/2020 2200   CO2 25 04/14/2020 0034   CO2 27 04/13/2020 0146   CO2 26 04/12/2020 2200   GLUCOSE 103 (H) 04/14/2020 0034   GLUCOSE 89 04/13/2020 0146   GLUCOSE 107 (H) 04/12/2020 2200   BUN 40 (H) 04/14/2020 0034   BUN 44 (H) 04/13/2020 0146   BUN 49 (H) 04/12/2020 2200   BUN 19 03/02/2020 1204   BUN 16 07/29/2019 1521   BUN 19 03/24/2019 1509   CREATININE 1.47 (H) 04/14/2020 0034   CREATININE 1.23 (H) 04/13/2020 0146   CREATININE 1.38 (H) 04/12/2020 2200    CREATININE 1.37 (H) 10/21/2019 1126   CREATININE 1.16 (H) 12/15/2018 1459   CREATININE 1.17 (H) 01/28/2018 1234   CALCIUM 9.9 04/14/2020 0034   CALCIUM 10.1 04/13/2020 0146   CALCIUM 10.3 04/12/2020 2200   GFRNONAA 37 (L) 04/14/2020 0034   GFRNONAA 46 (L) 04/13/2020 0146   GFRNONAA 40 (L) 04/12/2020 2200   GFRNONAA 38 (L) 10/21/2019 1126   GFRNONAA 47 (L) 12/15/2018 1459   GFRNONAA 47 (L) 01/28/2018 1234   GFRAA 48 (L) 03/02/2020 1204   GFRAA 44 (L) 10/21/2019 1126   GFRAA 52 (L) 07/29/2019 1521   GFRAA 48 (L) 03/24/2019 1509   GFRAA 54 (L) 12/15/2018 1459   GFRAA 54 (L) 01/28/2018 1234   CBC    Component Value Date/Time   WBC 10.6 (H) 04/14/2020 0034   RBC 4.33 04/14/2020 0034   HGB 13.2 04/14/2020 0034   HCT 38.9 04/14/2020 0034   PLT 140 (L) 04/14/2020 0034   MCV 89.8 04/14/2020 0034   MCH 30.5 04/14/2020 0034   MCHC 33.9 04/14/2020 0034   RDW 15.9 (H) 04/14/2020 0034   LYMPHSABS 1.2 04/12/2020 2200   MONOABS 0.5 04/12/2020 2200   EOSABS 0.1 04/12/2020 2200   BASOSABS 0.0 04/12/2020 2200   HEPATIC Function Panel Recent Labs    04/06/20 0357 04/07/20 6387  04/12/20 2200  PROT 5.3* 5.7* 4.8*   HEMOGLOBIN A1C No components found for: HGA1C,  MPG CARDIAC ENZYMES No results found for: CKTOTAL, CKMB, CKMBINDEX, TROPONINI BNP No results for input(s): PROBNP in the last 8760 hours. TSH No results for input(s): TSH in the last 8760 hours. CHOLESTEROL Recent Labs    03/02/20 1204  CHOL 161    Scheduled Meds: . (feeding supplement) PROSource Plus  30 mL Oral BID BM  . vitamin C  500 mg Oral Daily  . aspirin EC  81 mg Oral Daily  . atorvastatin  10 mg Oral Daily  . cholecalciferol  2,000 Units Oral Daily  . diclofenac Sodium  2 g Topical BID  . feeding supplement  1 Container Oral TID BM  . heparin  5,000 Units Subcutaneous Q8H  . hydroxychloroquine  200 mg Oral q morning - 10a  . mouth rinse  15 mL Mouth Rinse BID  . metoprolol tartrate  12.5 mg Oral BID   . oxybutynin  10 mg Oral QHS  . pantoprazole  40 mg Oral Daily  . sodium chloride flush  3 mL Intravenous Q12H  . traZODone  50 mg Oral QHS  . zinc sulfate  220 mg Oral Daily   Continuous Infusions: PRN Meds:.acetaminophen, baclofen, bisacodyl, chlorpheniramine-HYDROcodone, guaiFENesin-dextromethorphan, hydrALAZINE, hydroxypropyl methylcellulose / hypromellose, Ipratropium-Albuterol, magnesium citrate, morphine injection, nitroGLYCERIN, [DISCONTINUED] ondansetron **OR** ondansetron (ZOFRAN) IV, polyethylene glycol, simethicone, sodium chloride, zolpidem  Assessment/Plan: NSTEMI Acute on chronic respiratory failure with hypoxia COVID-19 pneumonia Hypothyroidism Obesity  Cardiac cath in AM. Discussed procedure, risks and alternatives. Patient agreeable to the procedure.   LOS: 21 days   Time spent including chart review, lab review, examination, discussion with patient, nurse and referring doctor : 30 min   Dixie Dials  MD  04/14/2020, 5:44 PM

## 2020-04-15 ENCOUNTER — Telehealth: Payer: Medicare Other

## 2020-04-15 ENCOUNTER — Inpatient Hospital Stay (HOSPITAL_COMMUNITY)
Admission: EM | Disposition: A | Discharge status: Home Health | Payer: Self-pay | Source: Home / Self Care | Attending: Internal Medicine

## 2020-04-15 ENCOUNTER — Encounter (HOSPITAL_COMMUNITY): Payer: Self-pay | Admitting: Cardiovascular Disease

## 2020-04-15 DIAGNOSIS — R778 Other specified abnormalities of plasma proteins: Secondary | ICD-10-CM | POA: Diagnosis not present

## 2020-04-15 DIAGNOSIS — J9601 Acute respiratory failure with hypoxia: Secondary | ICD-10-CM | POA: Diagnosis not present

## 2020-04-15 DIAGNOSIS — U071 COVID-19: Secondary | ICD-10-CM | POA: Diagnosis not present

## 2020-04-15 HISTORY — PX: LEFT HEART CATH AND CORONARY ANGIOGRAPHY: CATH118249

## 2020-04-15 LAB — CBC
HCT: 35.7 % — ABNORMAL LOW (ref 36.0–46.0)
HCT: 38 % (ref 36.0–46.0)
Hemoglobin: 11.7 g/dL — ABNORMAL LOW (ref 12.0–15.0)
Hemoglobin: 12.6 g/dL (ref 12.0–15.0)
MCH: 29.8 pg (ref 26.0–34.0)
MCH: 30.1 pg (ref 26.0–34.0)
MCHC: 32.8 g/dL (ref 30.0–36.0)
MCHC: 33.2 g/dL (ref 30.0–36.0)
MCV: 91.1 fL (ref 80.0–100.0)
MCV: 90.7 fL (ref 80.0–100.0)
Platelets: 124 10*3/uL — ABNORMAL LOW (ref 150–400)
Platelets: 123 10*3/uL — ABNORMAL LOW (ref 150–400)
RBC: 3.92 MIL/uL (ref 3.87–5.11)
RBC: 4.19 MIL/uL (ref 3.87–5.11)
RDW: 16.6 % — ABNORMAL HIGH (ref 11.5–15.5)
RDW: 16.8 % — ABNORMAL HIGH (ref 11.5–15.5)
WBC: 8.6 10*3/uL (ref 4.0–10.5)
WBC: 8.4 10*3/uL (ref 4.0–10.5)
nRBC: 0.2 % (ref 0.0–0.2)
nRBC: 0 % (ref 0.0–0.2)

## 2020-04-15 LAB — BASIC METABOLIC PANEL
Anion gap: 7 (ref 5–15)
BUN: 29 mg/dL — ABNORMAL HIGH (ref 8–23)
CO2: 26 mmol/L (ref 22–32)
Calcium: 9.6 mg/dL (ref 8.9–10.3)
Chloride: 106 mmol/L (ref 98–111)
Creatinine, Ser: 1.14 mg/dL — ABNORMAL HIGH (ref 0.44–1.00)
GFR, Estimated: 51 mL/min — ABNORMAL LOW (ref >60)
Glucose, Bld: 92 mg/dL (ref 70–99)
Potassium: 3.5 mmol/L (ref 3.5–5.1)
Sodium: 139 mmol/L (ref 135–145)

## 2020-04-15 LAB — CREATININE, SERUM
Creatinine, Ser: 1.07 mg/dL — ABNORMAL HIGH (ref 0.44–1.00)
GFR, Estimated: 55 mL/min — ABNORMAL LOW (ref >60)

## 2020-04-15 SURGERY — LEFT HEART CATH AND CORONARY ANGIOGRAPHY
Anesthesia: LOCAL

## 2020-04-15 MED ORDER — FAMOTIDINE IN NACL 20-0.9 MG/50ML-% IV SOLN
INTRAVENOUS | Status: AC
  Filled 2020-04-15: qty 50

## 2020-04-15 MED ORDER — MIDAZOLAM HCL 2 MG/2ML IJ SOLN
INTRAMUSCULAR | Status: DC | PRN
  Administered 2020-04-15: 1 mg via INTRAVENOUS

## 2020-04-15 MED ORDER — FAMOTIDINE IN NACL 20-0.9 MG/50ML-% IV SOLN
INTRAVENOUS | Status: AC | PRN
  Administered 2020-04-15: 20 mg via INTRAVENOUS

## 2020-04-15 MED ORDER — MIDAZOLAM HCL 2 MG/2ML IJ SOLN
INTRAMUSCULAR | Status: AC
  Filled 2020-04-15: qty 2

## 2020-04-15 MED ORDER — FENTANYL CITRATE (PF) 100 MCG/2ML IJ SOLN
INTRAMUSCULAR | Status: AC
  Filled 2020-04-15: qty 2

## 2020-04-15 MED ORDER — HEPARIN (PORCINE) IN NACL 1000-0.9 UT/500ML-% IV SOLN
INTRAVENOUS | Status: AC
  Filled 2020-04-15: qty 500

## 2020-04-15 MED ORDER — METHYLPREDNISOLONE SODIUM SUCC 125 MG IJ SOLR
INTRAMUSCULAR | Status: DC | PRN
  Administered 2020-04-15: 125 mg via INTRAVENOUS

## 2020-04-15 MED ORDER — SODIUM CHLORIDE 0.9% FLUSH
3.0000 mL | Freq: Two times a day (BID) | INTRAVENOUS | Status: DC
  Administered 2020-04-15 – 2020-04-16 (×3): 3 mL via INTRAVENOUS

## 2020-04-15 MED ORDER — DIPHENHYDRAMINE HCL 50 MG/ML IJ SOLN: 12.5000 mg | Freq: Once | INTRAMUSCULAR | Status: DC

## 2020-04-15 MED ORDER — HEPARIN (PORCINE) IN NACL 1000-0.9 UT/500ML-% IV SOLN
INTRAVENOUS | Status: DC | PRN
  Administered 2020-04-15: 500 mL

## 2020-04-15 MED ORDER — LIDOCAINE HCL (PF) 1 % IJ SOLN
INTRAMUSCULAR | Status: AC
  Filled 2020-04-15: qty 30

## 2020-04-15 MED ORDER — DIPHENHYDRAMINE HCL 50 MG/ML IJ SOLN
INTRAMUSCULAR | Status: DC | PRN
  Administered 2020-04-15: 12.5 mg via INTRAVENOUS

## 2020-04-15 MED ORDER — METHYLPREDNISOLONE SODIUM SUCC 125 MG IJ SOLR
INTRAMUSCULAR | Status: AC
  Filled 2020-04-15: qty 2

## 2020-04-15 MED ORDER — METHYLPREDNISOLONE SODIUM SUCC 125 MG IJ SOLR: 125.0000 mg | Freq: Once | INTRAMUSCULAR | Status: DC

## 2020-04-15 MED ORDER — VERAPAMIL HCL 2.5 MG/ML IV SOLN
INTRAVENOUS | Status: AC
  Filled 2020-04-15: qty 2

## 2020-04-15 MED ORDER — LIDOCAINE HCL (PF) 1 % IJ SOLN
INTRAMUSCULAR | Status: DC | PRN
  Administered 2020-04-15: 18 mL

## 2020-04-15 MED ORDER — SODIUM CHLORIDE 0.9% FLUSH
3.0000 mL | INTRAVENOUS | Status: DC | PRN
  Administered 2020-04-15: 3 mL via INTRAVENOUS

## 2020-04-15 MED ORDER — FENTANYL CITRATE (PF) 100 MCG/2ML IJ SOLN
INTRAMUSCULAR | Status: DC | PRN
  Administered 2020-04-15: 25 ug via INTRAVENOUS

## 2020-04-15 MED ORDER — FAMOTIDINE IN NACL 20-0.9 MG/50ML-% IV SOLN
20.0000 mg | Freq: Once | INTRAVENOUS | Status: AC
  Filled 2020-04-15: qty 50

## 2020-04-15 MED ORDER — HEPARIN SODIUM (PORCINE) 5000 UNIT/ML IJ SOLN
5000.0000 [IU] | Freq: Three times a day (TID) | INTRAMUSCULAR | Status: DC
  Administered 2020-04-15: 5000 [IU] via SUBCUTANEOUS
  Filled 2020-04-15 (×2): qty 1

## 2020-04-15 MED ORDER — DIPHENHYDRAMINE HCL 50 MG/ML IJ SOLN
INTRAMUSCULAR | Status: AC
  Filled 2020-04-15: qty 1

## 2020-04-15 MED ORDER — SODIUM CHLORIDE 0.9 % IV SOLN: INTRAVENOUS | Status: AC

## 2020-04-15 MED ORDER — IOHEXOL 350 MG/ML SOLN
INTRAVENOUS | Status: DC | PRN
  Administered 2020-04-15: 30 mL

## 2020-04-15 MED ORDER — LABETALOL HCL 5 MG/ML IV SOLN: 10.0000 mg | INTRAVENOUS | Status: AC | PRN

## 2020-04-15 MED ORDER — SODIUM CHLORIDE 0.9 % IV SOLN: 250.0000 mL | INTRAVENOUS | Status: DC | PRN

## 2020-04-15 SURGICAL SUPPLY — 8 items
CATH INFINITI 5FR MULTPACK ANG (CATHETERS) ×1 IMPLANT
KIT HEART LEFT (KITS) ×2 IMPLANT
MAT PREVALON FULL STRYKER (MISCELLANEOUS) ×1 IMPLANT
PACK CARDIAC CATHETERIZATION (CUSTOM PROCEDURE TRAY) ×2 IMPLANT
SHEATH PINNACLE 5F 10CM (SHEATH) ×1 IMPLANT
SHEATH PROBE COVER 6X72 (BAG) ×1 IMPLANT
TRANSDUCER W/STOPCOCK (MISCELLANEOUS) ×2 IMPLANT
WIRE EMERALD 3MM-J .035X150CM (WIRE) ×1 IMPLANT

## 2020-04-15 NOTE — Progress Notes (Signed)
Site area: right groin  Site Prior to Removal:  Level 0  Pressure Applied For 20 MINUTES    Minutes Beginning at 0850  Manual:   Yes.    Patient Status During Pull:  stable  Post Pull Groin Site:  Level 0  Post Pull Instructions Given:  Yes.    Post Pull Pulses Present:  Yes.    Dressing Applied:  Yes.    Comments:  Bed rest started at 0915.

## 2020-04-15 NOTE — TOC Initial Note (Signed)
Transition of Care [facility]) - Initial/Assessment Note    Patient Details  Name: Amanda Ross MRN: [id number] Date of Birth: 05-Jan-1946  [facility] CM/SW Contact:    Bethena Roys, RN Phone Number: 04/15/2020, 5:52 PM  Clinical Narrative: Risk for readmission assessment completed. Case Manager spoke with patient regarding transition of care plans. Patient will return home with the support of husband. Patient states she gets to doctors appointments without any problems and she gets her medications without any issues. Patient in need of home oxygen. Discussed agencies and the patient is agreeable to using [facility]- referral made and equipment will be delivered to the room. Case Manager also provided the patient with the Medicare.gov list for home health physical therapy services. Patient wanted to review the list with her husband. Weekend Case Manager will follow up with patient for choice. Patient had questions regarding the rollator- Case Manager did make the patient aware that if she chooses to get the rollator via insurance and if she needs a wheelchair within 5 years, insurance will not cover the fee for the wheelchair. We discussed medical supply stores that she may visit to purchase the rollator @ an out of pocket cost. Case Manager will continue to follow.   Expected Discharge Plan: [place] Barriers to Discharge: No Barriers Identified   Patient Goals and CMS Choice Patient states their goals for this hospitalization and ongoing recovery are:: to return home CMS Medicare.gov Compare Post Acute Care list provided to:: Patient Choice offered to / list presented to : Patient (Patient wants to discuss with her husband about choice.)  Expected Discharge Plan and Services Expected Discharge Plan: [place] In-house Referral: NA Discharge Planning Services: CM Consult Post Acute Care Choice: Durable Medical Equipment, Home  Health Living arrangements for the past 2 months: Single Family Home                 DME Arranged: Oxygen DME Agency: [facility] Date DME Agency Contacted: 04/15/20 Time DME Agency Contacted: 73 Representative spoke with at DME Agency: Freda Munro HH Arranged: PT          Prior Living Arrangements/Services Living arrangements for the past 2 months: Single Family Home Lives with:: Spouse Patient language and need for interpreter reviewed:: Yes        Need for Family Participation in Patient Care: Yes (Comment) Care giver support system in place?: Yes (comment)   Criminal Activity/Legal Involvement Pertinent to Current Situation/Hospitalization: No - Comment as needed  Activities of Daily Living Home Assistive Devices/Equipment: None ADL Screening (condition at time of admission) Patient's cognitive ability adequate to safely complete daily activities?: Yes Is the patient deaf or have difficulty hearing?: No Does the patient have difficulty seeing, even when wearing glasses/contacts?: No Does the patient have difficulty concentrating, remembering, or making decisions?: No Patient able to express need for assistance with ADLs?: Yes Does the patient have difficulty dressing or bathing?: No Independently performs ADLs?: Yes (appropriate for developmental age) Does the patient have difficulty walking or climbing stairs?: No Weakness of Legs: Both Weakness of Arms/Hands: Both  Permission Sought/Granted Permission sought to share information with : Case Manager, Family Supports, Chartered certified accountant granted to share information with : Yes, Verbal Permission Granted     Permission granted to share info w AGENCY: [facility]        Emotional Assessment Appearance:: Appears stated age Attitude/Demeanor/Rapport: Engaged Affect (typically observed): Appropriate Orientation: : Oriented to Place,  Oriented to Self, Oriented to Situation Alcohol / Substance Use: Not  Applicable Psych Involvement: No (comment)  Admission diagnosis:  Elevated troponin [R77.8] Acute respiratory failure with hypoxia (HCC) [J96.01] Acute hypoxemic respiratory failure due to COVID-19 ([place]) [U07.1, J96.01] COVID-19 [U07.1] Patient Active Problem List   Diagnosis Date Noted  . Acute hypoxemic respiratory failure due to COVID-19 ([place]) 03/24/2020  . Vitamin D deficiency, unspecified 03/05/2018  . Mixed hyperlipidemia 03/05/2018  . 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 Pain in unspecified joint 03/05/2018  . Abnormal glucose 03/05/2018  . Trochanteric bursitis of both hips 07/30/2017  . Sjogren's syndrome ([place]) 05/22/2016  . Sicca syndrome, unspecified 05/22/2016  . High risk medication use 05/22/2016  . Primary hyperparathyroidism ([place]) 07/09/2013  . Left thyroid nodule 07/06/2013  . Dyspnea 10/11/2010   PCP:  [name], MD Pharmacy:   [facility] [address] [place] [address] Phone: [phone] Fax: [phone]   Readmission Risk Interventions Readmission Risk Prevention Plan 04/15/2020  Transportation Screening Complete  PCP or Specialist Appt within 3-5 Days Complete  HRI or Home Care Consult Complete  Social Work Consult for [place] Planning/Counseling Complete  Palliative Care Screening Not Applicable  Medication Review Press photographer) Complete  Some recent data might be hidden

## 2020-04-15 NOTE — Progress Notes (Signed)
SATURATION QUALIFICATIONS: (This note is used to comply with regulatory documentation for home oxygen)  Patient Saturations on Room Air at Rest = 87%   Please briefly explain why patient needs home oxygen: Dyspnea with exertion.

## 2020-04-15 NOTE — Progress Notes (Signed)
Physical Therapy Treatment Patient Details Name: Amanda Ross MRN: [id number] DOB: July 06, 1945 Today's Date: 04/15/2020    History of Present Illness Pt is 74 y.o. female with history of asthma, HTN, OSA, primary hypothyroidism and left thyroid nodule resented with myalgias, chills, intermittent cough) shortness of breath.  She tested positive for COVID-19 at her PCP office.  She apparently desaturated into the mid 80s and was sent over to the emergency department.  Chest x-ray did not show any significant findings.  CT angiogram did not show PE but did show multifocal patchy airspace opacities right greater than left. On 10/25 pt developed chest pain with EKG abnormalitie and elevated troponin.  Cardiology was consulted and are treated for acute coronary syndrome/NSTEMI.    PT Comments    Pt remains motivated to work toward return home but activity tolerance remains poor. May need to look at post acute rehab if activity tolerance doesn't improve soon.    Follow Up Recommendations  Home health PT;Supervision/Assistance - 24 hour     Equipment Recommendations  Other (comment) (rollator and O2)    Recommendations for Other Services       Precautions / Restrictions Precautions Precautions: Fall Precaution Comments: watch O2    Mobility  Bed Mobility Overal bed mobility: Needs Assistance Bed Mobility: Supine to Sit     Supine to sit: Supervision     General bed mobility comments: Supervision for safety  Transfers Overall transfer level: Needs assistance Equipment used: 4-wheeled walker Transfers: Sit to/from Stand Sit to Stand: Min guard         General transfer comment: Assist for safety/lines  Ambulation/Gait Ambulation/Gait assistance: Min guard Gait Distance (Feet): 2 Feet Assistive device: 4-wheeled walker Gait Pattern/deviations: Step-to pattern;Decreased step length - right;Decreased step length - left;Shuffle Gait velocity: decr Gait velocity  interpretation: <1.31 ft/sec, indicative of household ambulator General Gait Details: Assist to amb short distance from bed to chair.    Stairs             Wheelchair Mobility    Modified Rankin (Stroke Patients Only)       Balance Overall balance assessment: Needs assistance Sitting-balance support: No upper extremity supported;Feet supported Sitting balance-Leahy Scale: Good     Standing balance support: Single extremity supported;During functional activity Standing balance-Leahy Scale: Poor Standing balance comment: UE support and min guard for static standing                            Cognition Arousal/Alertness: Awake/alert Behavior During Therapy: WFL for tasks assessed/performed;Anxious Overall Cognitive Status: Within Functional Limits for tasks assessed                                        Exercises      General Comments General comments (skin integrity, edema, etc.): Pt on 3L O2 at rest with SpO2 90%. With activity incr O2 to 6L and pt with SpO2 84-86%. Left on 6L with pt in chair with continued low SpO2 but no signs of distress. Nurse aware.       Pertinent Vitals/Pain Pain Assessment: No/denies pain    Home Living                      Prior Function            PT Goals (current goals can now  be found in the care plan section) Acute Rehab PT Goals Patient Stated Goal: go home soon Progress towards PT goals: Not progressing toward goals - comment    Frequency    Min 3X/week      PT Plan Current plan remains appropriate    Co-evaluation              AM-PAC PT "6 Clicks" Mobility   Outcome Measure  Help needed turning from your back to your side while in a flat bed without using bedrails?: A Little Help needed moving from lying on your back to sitting on the side of a flat bed without using bedrails?: A Little Help needed moving to and from a bed to a chair (including a wheelchair)?: A  Little Help needed standing up from a chair using your arms (e.g., wheelchair or bedside chair)?: A Little Help needed to walk in hospital room?: A Lot Help needed climbing 3-5 steps with a railing? : Total 6 Click Score: 15    End of Session Equipment Utilized During Treatment: Oxygen Activity Tolerance: Patient limited by fatigue Patient left: in chair;with call bell/phone within reach;with chair alarm set Nurse Communication: Mobility status;Other (comment) (spO2) PT Visit Diagnosis: Other abnormalities of gait and mobility (R26.89)     Time: 7096-2836 PT Time Calculation (min) (ACUTE ONLY): 33 min  Charges:  $Therapeutic Activity: 23-37 mins                     Napoleon Pager [phone] Office [place] 04/15/2020, 5:28 PM

## 2020-04-15 NOTE — Interval H&P Note (Signed)
History and Physical Interval Note:  04/15/2020 7:47 AM  Amanda Ross  has presented today for surgery, with the diagnosis of nonstemi.  The various methods of treatment have been discussed with the patient and family. After consideration of risks, benefits and other options for treatment, the patient has consented to  Procedure(s): LEFT HEART CATH AND CORONARY ANGIOGRAPHY (N/A) as a surgical intervention.  The patient's history has been reviewed, patient examined, no change in status, stable for surgery.  I have reviewed the patient's chart and labs.  Questions were answered to the patient's satisfaction.     [name]

## 2020-04-15 NOTE — Progress Notes (Signed)
PROGRESS NOTE                                                                                                                                                                                                             Patient Demographics:    Amanda Ross, is a 74 y.o. female, DOB - Oct 25, 1945, EXN:[id number]  Outpatient Primary MD for the patient is [name], MD   Admit date - 03/24/2020   LOS - 86  Chief Complaint  Patient presents with  . Generalized Body Aches  . Shortness of Breath       Brief Narrative: Patient is a 74 y.o. female with PMHx of Sjogren's syndrome, HTN, asthma, left thyroid nodule-presented to the ED on 10/7 with acute hypoxic respiratory failure due to COVID-19 pneumonia.  On initial presentation-had mild hypoxemia-however developed worsening hypoxemia requiring HFNC.  COVID-19 vaccinated status: Vaccinated  Significant Events: 10/7>> Admit to [facility] for mild hypoxemia due to COVID-19 pneumonia 10/12>> worsening hypoxemia-s/p Actemra 10/25>> chest pain requiring cardiology consultation  Significant studies: 10/7>>Chest x-ray: No acute disease 10/8>> CTA chest: No PE, multifocal pneumonia 10/9>> lower extremity Doppler: No DVT 10/11>> chest x-ray: Multifocal airspace opacities 10/17>> bilateral pulmonary opacities 10/20>> Echo: EF 60-65%  COVID-19 medications: Steroids: 10/7>>10/26 Remdesivir: 10/7>> 10/11 Actemra: 10/12 x 1  Antibiotics: None  Microbiology data: 10/7 >>blood culture: 1/2  staph epidermidis 10/8>> blood culture: No growth  Procedures: 10/29>> LHC: No significant CAD  Consults: Cardiology  DVT prophylaxis: heparin injection 5,000 Units Start: 04/15/20 2200    Subjective:   Feels much better-no major issues overnight.   Assessment  & Plan :   Acute Hypoxic Resp Failure due to Covid 19 Viral pneumonia: Overall improved-continue to attempt to titrate  off oxygen.  Has completed more than 21 days of isolation-does not require any further isolation for COVID-19 pneumonia.    Fever: afebrile O2 requirements:  SpO2: (!) 89 % O2 Flow Rate (L/min): 4 L/min FiO2 (%): 70 %   COVID-19 Labs: No results for input(s): DDIMER, FERRITIN, LDH, CRP in the last 72 hours.     Component Value Date/Time   BNP 105.3 (H) 04/07/2020 0625    No results for input(s): PROCALCITON in the last 168 hours.  Lab Results  Component Value Date   SARSCOV2NAA POSITIVE (A) 03/24/2020   [place] Detected (A) 03/24/2020  Prone/Incentive Spirometry: encouraged  incentive spirometry use 3-4/hour.  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  Remains on aspirin/statin/beta-blocker.    AKI on CKD stage IIIb: AKI hemodynamically mediated-significantly improved-follow electrolytes periodically.    Asthma: Stable-continue bronchodilators.  HTN: BP has been soft for the past several days-high 13Y/QMV 784 systolic-continue metoprolol cautiously with holding parameters.  She is asymptomatic.   History of overactive bladder: Continue oxybutynin  History of Sjogren's syndrome: Continue Plaquenil-on artificial tears.  Thrombocytopenia: Mild-stable for follow-up without further work-up.  OSA: On oxygen-not sure if she uses CPAP nightly at home.    Nutrition Problem: Nutrition Problem: Increased nutrient needs Etiology: catabolic illness (ONGEX-52) Signs/Symptoms: estimated needs Interventions: Liberalize Diet, Boost Breeze, Prostat  Morbid Obesity: Estimated body mass index is 37.53 kg/m as calculated from the following:   Height as of this encounter: 5\' 3"  (1.6 m).   Weight as of this encounter: 96.1 kg.    GI prophylaxis: PPI  ABG: No results found for: PHART, PCO2ART, PO2ART, HCO3, TCO2, ACIDBASEDEF, O2SAT  Vent Settings: N/A  Condition - Stable  Family Communication  :   Spouse-[phone] updated over the phone on 10/29  Code Status :  Full Code  Diet :  Diet Order            Diet Heart Room service appropriate? Yes; Fluid consistency: Thin  Diet effective now                  Disposition Plan  :   Status is: Inpatient  Remains inpatient appropriate because:Inpatient level of care appropriate due to severity of illness   Dispo:  Patient From: Home  Planned Disposition: Home  Expected discharge date: 1030/21  Medically stable for discharge: No    Barriers to discharge: Hypoxia requiring O2 supplementation  Antimicorbials  :    Anti-infectives (From admission, onward)   Start     Dose/Rate Route Frequency Ordered Stop   04/14/20 1000  hydroxychloroquine (PLAQUENIL) tablet 200 mg        200 mg Oral  Every morning - 10a 04/13/20 1514     03/25/20 1000  remdesivir 100 mg in sodium chloride 0.9 % 100 mL IVPB       "Followed by" Linked Group Details   100 mg 200 mL/hr over 30 Minutes Intravenous Daily 03/24/20 1533 03/28/20 0900   03/24/20 1545  remdesivir 200 mg in sodium chloride 0.9% 250 mL IVPB       "Followed by" Linked Group Details   200 mg 580 mL/hr over 30 Minutes Intravenous Once 03/24/20 1533 03/24/20 1940      Inpatient Medications  Scheduled Meds: . (feeding supplement) PROSource Plus  30 mL Oral BID BM  . vitamin C  500 mg Oral Daily  . aspirin EC  81 mg Oral Daily  . atorvastatin  10 mg Oral Daily  . cholecalciferol  2,000 Units Oral Daily  . diclofenac Sodium  2 g Topical BID  . diphenhydrAMINE  12.5 mg Intravenous Once  . feeding supplement  1 Container Oral TID BM  . heparin  5,000 Units Subcutaneous Q8H  . hydroxychloroquine  200 mg Oral q morning - 10a  . mouth rinse  15 mL Mouth Rinse BID  . methylPREDNISolone (SOLU-MEDROL) injection  125 mg Intravenous Once  . metoprolol tartrate  12.5 mg Oral BID  . oxybutynin  10 mg Oral QHS  . pantoprazole  40 mg Oral Daily  . sodium chloride  flush  3 mL  Intravenous Q12H  . sodium chloride flush  3 mL Intravenous Q12H  . traZODone  50 mg Oral QHS  . zinc sulfate  220 mg Oral Daily   Continuous Infusions: . sodium chloride 50 mL/hr at 04/15/20 0852  . sodium chloride    . famotidine (PEPCID) IV     PRN Meds:.sodium chloride, acetaminophen, baclofen, bisacodyl, chlorpheniramine-HYDROcodone, guaiFENesin-dextromethorphan, hydrALAZINE, hydroxypropyl methylcellulose / hypromellose, Ipratropium-Albuterol, labetalol, magnesium citrate, morphine injection, nitroGLYCERIN, [DISCONTINUED] ondansetron **OR** ondansetron (ZOFRAN) IV, polyethylene glycol, simethicone, sodium chloride, sodium chloride flush, zolpidem   Time Spent in minutes  25  See all Orders from today for further details   Oren Binet M.D on 04/15/2020 at 10:16 AM  To page go to [email or web address] - use universal password  [facility] -  Office  [phone]    Objective:   Vitals:   04/15/20 0840 04/15/20 0900 04/15/20 0954 04/15/20 1008  BP:  (!) 103/49 (!) 88/63 102/86  Pulse: 87 81 84 86  Resp: 20 (!) 23    Temp:   99.1 F (37.3 C)   TempSrc:   Oral   SpO2:  95% 93% (!) 89%  Weight:      Height:        Wt Readings from Last 3 Encounters:  04/11/20 96.1 kg  03/02/20 105.7 kg  03/02/20 101.2 kg     Intake/Output Summary (Last 24 hours) at 04/15/2020 1016 Last data filed at 04/15/2020 0434 Gross per 24 hour  Intake 237 ml  Output 300 ml  Net -63 ml     Physical Exam Gen Exam:Alert awake-not in any distress HEENT:atraumatic, normocephalic Chest: B/L clear to auscultation anteriorly CVS:S1S2 regular Abdomen:soft non tender, non distended Extremities:trace edema Neurology: Non focal Skin: no rash   Data Review:    CBC Recent Labs  Lab 04/12/20 0300 04/12/20 2200 04/13/20 0146 04/14/20 0034 04/15/20 0214  WBC 14.4* 12.8* 11.0* 10.6* 8.6  HGB 13.4 12.8 12.3 13.2 11.7*  HCT 40.0 38.1 35.8* 38.9 35.7*  PLT 162 145* 144* 140* 124*    MCV 88.9 88.8 88.6 89.8 91.1  MCH 29.8 29.8 30.4 30.5 29.8  MCHC 33.5 33.6 34.4 33.9 32.8  RDW 15.0 15.5 15.5 15.9* 16.6*  LYMPHSABS  --  1.2  --   --   --   MONOABS  --  0.5  --   --   --   EOSABS  --  0.1  --   --   --   BASOSABS  --  0.0  --   --   --     Chemistries  Recent Labs  Lab 04/11/20 0159 04/11/20 0159 04/12/20 0300 04/12/20 2200 04/13/20 0146 04/14/20 0034 04/15/20 0214  NA 138   < > 138 137 139 138 139  K 4.0   < > 4.8 4.1 3.9 3.8 3.5  CL 103   < > 100 103 104 105 106  CO2 28   < > 30 26 27 25 26   GLUCOSE 138*   < > 96 107* 89 103* 92  BUN 58*   < > 52* 49* 44* 40* 29*  CREATININE 1.39*   < > 1.29* 1.38* 1.23* 1.47* 1.14*  CALCIUM 10.9*   < > 10.8* 10.3 10.1 9.9 9.6  MG 2.3  --   --   --   --   --   --   AST  --   --   --  25  --   --   --  ALT  --   --   --  51*  --   --   --   ALKPHOS  --   --   --  51  --   --   --   BILITOT  --   --   --  1.0  --   --   --    < > = values in this interval not displayed.   ------------------------------------------------------------------------------------------------------------------ No results for input(s): CHOL, HDL, LDLCALC, TRIG, CHOLHDL, LDLDIRECT in the last 72 hours.  Lab Results  Component Value Date   HGBA1C 5.5 02/19/2018   ------------------------------------------------------------------------------------------------------------------ No results for input(s): TSH, T4TOTAL, T3FREE, THYROIDAB in the last 72 hours.  Invalid input(s): FREET3 ------------------------------------------------------------------------------------------------------------------ No results for input(s): VITAMINB12, FOLATE, FERRITIN, TIBC, IRON, RETICCTPCT in the last 72 hours.  Coagulation profile No results for input(s): INR, PROTIME in the last 168 hours.  No results for input(s): DDIMER in the last 72 hours.  Cardiac Enzymes No results for input(s): CKMB, TROPONINI, MYOGLOBIN in the last 168 hours.  Invalid input(s):  CK ------------------------------------------------------------------------------------------------------------------    Component Value Date/Time   BNP 105.3 (H) 04/07/2020 5462    Micro Results No results found for this or any previous visit (from the past 240 hour(s)).  Radiology Reports DG Chest 2 View  Result Date: 03/23/2020 CLINICAL DATA:  Shortness of breath EXAM: CHEST - 2 VIEW COMPARISON:  February 25, 2018 FINDINGS: There is mild scarring in the left mid lung region. There is interstitial thickening in portions of the mid and lower lung regions bilaterally. No edema or airspace opacity. Heart size and pulmonary vascular normal. There is aortic atherosclerosis. Postoperative change noted in right shoulder. There is degenerative change in the thoracic spine. IMPRESSION: Suspect a degree of underlying chronic bronchitis. Slight scarring left mid lung. No edema or airspace opacity. Stable cardiac silhouette. Aortic Atherosclerosis (ICD10-I70.0). Electronically Signed   By: Lowella Grip III M.D.   On: 03/23/2020 12:44   CT ANGIO CHEST PE W OR WO CONTRAST  Result Date: 03/25/2020 CLINICAL DATA:  Shortness of breath, covid positive EXAM: CT ANGIOGRAPHY CHEST WITH CONTRAST TECHNIQUE: Multidetector CT imaging of the chest was performed using the standard protocol during bolus administration of intravenous contrast. Multiplanar CT image reconstructions and MIPs were obtained to evaluate the vascular anatomy. CONTRAST:  126mL OMNIPAQUE IOHEXOL 350 MG/ML SOLN COMPARISON:  None. FINDINGS: Cardiovascular: There is a optimal opacification of the pulmonary arteries. There is no central,segmental, or subsegmental filling defects within the pulmonary arteries. The heart is normal in size. No pericardial effusion or thickening. No evidence right heart strain. There is normal three-vessel brachiocephalic anatomy without proximal stenosis. Scattered aortic atherosclerosis is noted. Mediastinum/Nodes: No  hilar, mediastinal, or axillary adenopathy. Thyroid gland, trachea, and esophagus demonstrate no significant findings. Lungs/Pleura: Multifocal patchy airspace opacities are seen throughout both lungs, right greater than left. No pleural effusion or pneumothorax. Upper Abdomen: No acute abnormalities present in the visualized portions of the upper abdomen. Low-density lesions are seen within the left kidney and anterior left hepatic lobe. Musculoskeletal: No chest wall abnormality. No acute or significant osseous findings. Review of the MIP images confirms the above findings. IMPRESSION: No central, segmental, or subsegmental pulmonary embolism. Multifocal patchy airspace opacities, right greater than left, consistent with multifocal viral pneumonia Electronically Signed   By: Prudencio Pair M.D.   On: 03/25/2020 03:14   CARDIAC CATHETERIZATION  Result Date: 04/15/2020 Medical treatment for possible microvascular disease or demand ischemia. Low dose aspirin as  tolerated.  DG Chest Port 1 View  Result Date: 04/03/2020 CLINICAL DATA:  Shortness of breath. EXAM: PORTABLE CHEST 1 VIEW COMPARISON:  03/31/2020 chest radiograph and prior. FINDINGS: Bilateral mid to lower lung patchy opacities are more conspicuous than prior exam. No pneumothorax or pleural effusion. Mild cardiomegaly and aortic arch atherosclerotic calcifications. Osseous structures are unchanged. IMPRESSION: Slightly increased conspicuity of bilateral pulmonary opacities concerning for multifocal pneumonia. Electronically Signed   By: Primitivo Gauze M.D.   On: 04/03/2020 08:24   DG Chest Port 1 View  Result Date: 03/31/2020 CLINICAL DATA:  Shortness of breath. EXAM: PORTABLE CHEST 1 VIEW COMPARISON:  03/28/2020 FINDINGS: Mild cardiomegaly stable. Aortic atherosclerotic calcification noted. Pulmonary infiltrate is again seen at the right base, with mild worsening since previous study. There is also mild airspace disease in the peripheral  left mid lung which is unchanged in appearance. No definite pleural effusion or pneumothorax. IMPRESSION: Mild worsening of right basilar pulmonary infiltrate. Stable mild airspace disease in peripheral left mid lung. Electronically Signed   By: Marlaine Hind M.D.   On: 03/31/2020 08:07   DG Chest Port 1 View  Result Date: 03/28/2020 CLINICAL DATA:  Shortness of breath.  Reported COVID-19 positive EXAM: PORTABLE CHEST 1 VIEW COMPARISON:  Chest CT March 25, 2020; chest radiograph March 24, 2020 FINDINGS: There is patchy airspace opacity in the mid and lower lung zones bilaterally, most notable in the right lower lung region. There appears to be slightly less opacity in the upper lobes compared to recent CT. No consolidation. Heart is upper normal in size with pulmonary vascularity normal. No adenopathy. No bone lesions. There is aortic atherosclerosis. IMPRESSION: Multifocal airspace opacity, likely representing atypical organism multifocal pneumonia. Slightly less opacity in the upper lobes compared to recent CT examination. Other areas appear essentially stable. No new opacities evident. Stable cardiac silhouette.  No evident adenopathy. Aortic Atherosclerosis (ICD10-I70.0). Electronically Signed   By: Lowella Grip III M.D.   On: 03/28/2020 10:12   DG Chest Portable 1 View  Result Date: 03/24/2020 CLINICAL DATA:  Body aches and shortness of breath. EXAM: PORTABLE CHEST 1 VIEW COMPARISON:  Single-view of the chest 02/25/2018. PA and lateral chest 03/23/2020. FINDINGS: There is some coarsening of the pulmonary interstitium. No consolidative process, pneumothorax or effusion. Aortic atherosclerosis. No acute or focal bony abnormality. IMPRESSION: No acute disease. Aortic Atherosclerosis (ICD10-I70.0). Electronically Signed   By: Inge Rise M.D.   On: 03/24/2020 11:54   VAS [place] LOWER EXTREMITY VENOUS (DVT)  Result Date: 03/27/2020  Lower Venous DVTStudy Indications: COVID-19 positive. Elevated  d-dimer.  Comparison Study: No prior study Performing Technologist: Maudry Mayhew MHA, RDMS, RVT, RDCS  Examination Guidelines: A complete evaluation includes B-mode imaging, spectral Doppler, color Doppler, and power Doppler as needed of all accessible portions of each vessel. Bilateral testing is considered an integral part of a complete examination. Limited examinations for reoccurring indications may be performed as noted. The reflux portion of the exam is performed with the patient in reverse Trendelenburg.  +---------+---------------+---------+-----------+----------+--------------+ RIGHT    CompressibilityPhasicitySpontaneityPropertiesThrombus Aging +---------+---------------+---------+-----------+----------+--------------+ CFV      Full           Yes      Yes                                 +---------+---------------+---------+-----------+----------+--------------+ SFJ      Full                                                        +---------+---------------+---------+-----------+----------+--------------+  FV Prox  Full                                                        +---------+---------------+---------+-----------+----------+--------------+ FV Mid   Full                                                        +---------+---------------+---------+-----------+----------+--------------+ FV DistalFull                                                        +---------+---------------+---------+-----------+----------+--------------+ PFV      Full                                                        +---------+---------------+---------+-----------+----------+--------------+ POP      Full           Yes      Yes                                 +---------+---------------+---------+-----------+----------+--------------+ PTV      Full                                                         +---------+---------------+---------+-----------+----------+--------------+ PERO     Full                                                        +---------+---------------+---------+-----------+----------+--------------+   +---------+---------------+---------+-----------+----------+--------------+ LEFT     CompressibilityPhasicitySpontaneityPropertiesThrombus Aging +---------+---------------+---------+-----------+----------+--------------+ CFV      Full           Yes      Yes                                 +---------+---------------+---------+-----------+----------+--------------+ SFJ      Full                                                        +---------+---------------+---------+-----------+----------+--------------+ FV Prox  Full                                                        +---------+---------------+---------+-----------+----------+--------------+  FV Mid   Full                                                        +---------+---------------+---------+-----------+----------+--------------+ FV DistalFull                                                        +---------+---------------+---------+-----------+----------+--------------+ PFV      Full                                                        +---------+---------------+---------+-----------+----------+--------------+ POP      Full           Yes      Yes                                 +---------+---------------+---------+-----------+----------+--------------+ PTV      Full                                                        +---------+---------------+---------+-----------+----------+--------------+ PERO     Full                                                        +---------+---------------+---------+-----------+----------+--------------+     Summary: RIGHT: - There is no evidence of deep vein thrombosis in the lower extremity.  - No cystic structure found in  the popliteal fossa.  LEFT: - There is no evidence of deep vein thrombosis in the lower extremity.  - No cystic structure found in the popliteal fossa.  *See table(s) above for measurements and observations. Electronically signed by Servando Snare MD on 03/27/2020 at 11:33:24 AM.    Final    ECHOCARDIOGRAM LIMITED  Result Date: 04/06/2020    ECHOCARDIOGRAM LIMITED REPORT   Patient Name:   Amanda Ross Date of Exam: 04/06/2020 Medical Rec #:  [id number]         Height:       63.0 in Accession #:    [id number]        Weight:       225.0 lb Date of Birth:  [birth year]         BSA:          2.033 m Patient Age:    89 years          BP:           111/75 mmHg Patient Gender: F                 HR:           96 bpm.  Exam Location:  Inpatient Procedure: Limited Echo, Cardiac Doppler, Color Doppler and Intracardiac            Opacification Agent Indications:    Dyspnea  History:        Patient has no prior history of Echocardiogram examinations.                 Risk Factors:Dyslipidemia and Former Smoker. COVID-19.  Sonographer:    Clayton Lefort RDCS (AE) Referring Phys: [id number] DAWOOD Graciela Husbands  Sonographer Comments: Technically challenging study due to limited acoustic windows, Technically difficult study due to poor echo windows, suboptimal parasternal window, suboptimal apical window, suboptimal subcostal window and patient is morbidly obese.  COVID-19. IMPRESSIONS  1. Left ventricular ejection fraction, by estimation, is 60 to 65%. The left ventricle has normal function.  2. Right ventricular systolic function was not well visualized. The right ventricular size is moderately enlarged.  3. The mitral valve was not well visualized.  4. The aortic valve was not well visualized.  5. The inferior vena cava is normal in size with <50% respiratory variability, suggesting right atrial pressure of 8 mmHg. Comparison(s): No prior Echocardiogram. Conclusion(s)/Recommendation(s): Technically challenging study with limited windows.  Even with use of echo contrast, able to assess normal LVEF but limited for wall motion analysis. No significant valve disease by doppler, but not well visualized. FINDINGS  Left Ventricle: Left ventricular ejection fraction, by estimation, is 60 to 65%. The left ventricle has normal function. Definity contrast agent was given IV to delineate the left ventricular endocardial borders. Right Ventricle: The right ventricular size is moderately enlarged. Right vetricular wall thickness was not well visualized. Right ventricular systolic function was not well visualized. Left Atrium: Left atrial size was not well visualized. Right Atrium: Right atrial size was not well visualized. Pericardium: The pericardium was not well visualized. Mitral Valve: The mitral valve was not well visualized. Tricuspid Valve: The tricuspid valve is not well visualized. Aortic Valve: The aortic valve was not well visualized. Pulmonic Valve: The pulmonic valve was not well visualized. Aorta: The aortic root was not well visualized, the ascending aorta was not well visualized and the aortic arch was not well visualized. Venous: The inferior vena cava is normal in size with less than 50% respiratory variability, suggesting right atrial pressure of 8 mmHg. LEFT VENTRICLE PLAX 2D LVOT diam:     2.10 cm  Diastology LVOT Area:     3.46 cm LV e' medial:    3.37 cm/s                         LV E/e' medial:  11.1                         LV e' lateral:   4.46 cm/s                         LV E/e' lateral: 8.4  IVC IVC diam: 1.70 cm  AORTA Ao Root diam: 3.30 cm Ao Asc diam:  3.30 cm MITRAL VALVE MV Area (PHT): 3.68 cm    SHUNTS MV Decel Time: 206 msec    Systemic Diam: 2.10 cm MV E velocity: 37.30 cm/s MV A velocity: 72.40 cm/s MV E/A ratio:  0.52 Buford Dresser MD Electronically signed by Buford Dresser MD Signature Date/Time: 04/06/2020/2:33:32 PM    Final

## 2020-04-16 DIAGNOSIS — I10 Essential (primary) hypertension: Secondary | ICD-10-CM | POA: Diagnosis not present

## 2020-04-16 DIAGNOSIS — U071 COVID-19: Secondary | ICD-10-CM | POA: Diagnosis not present

## 2020-04-16 DIAGNOSIS — J9601 Acute respiratory failure with hypoxia: Secondary | ICD-10-CM | POA: Diagnosis not present

## 2020-04-16 DIAGNOSIS — R778 Other specified abnormalities of plasma proteins: Secondary | ICD-10-CM | POA: Diagnosis not present

## 2020-04-16 LAB — CBC
HCT: 37 % (ref 36.0–46.0)
Hemoglobin: 12.4 g/dL (ref 12.0–15.0)
MCH: 30.7 pg (ref 26.0–34.0)
MCHC: 33.5 g/dL (ref 30.0–36.0)
MCV: 91.6 fL (ref 80.0–100.0)
Platelets: 130 10*3/uL — ABNORMAL LOW (ref 150–400)
RBC: 4.04 MIL/uL (ref 3.87–5.11)
RDW: 17 % — ABNORMAL HIGH (ref 11.5–15.5)
WBC: 11.7 10*3/uL — ABNORMAL HIGH (ref 4.0–10.5)
nRBC: 0.2 % (ref 0.0–0.2)

## 2020-04-16 MED ORDER — ATORVASTATIN CALCIUM 10 MG PO TABS: 10.0000 mg | ORAL_TABLET | Freq: Every day | ORAL | 0 refills | Status: AC

## 2020-04-16 MED ORDER — PROSOURCE PLUS PO LIQD: 30.0000 mL | Freq: Two times a day (BID) | ORAL | 0 refills | Status: AC

## 2020-04-16 NOTE — Progress Notes (Signed)
SATURATION QUALIFICATIONS: (This note is used to comply with regulatory documentation for home oxygen)  Patient Saturations on Room Air at Rest = 80%  Patient Saturations on Room Air while Ambulating =  74%  Patient Saturations on 3 Liters of oxygen while Ambulating = 88%  Please briefly explain why patient needs home oxygen:

## 2020-04-16 NOTE — Consult Note (Signed)
Ref: [name], MD   Subjective:  Awake. Afebrile. O2 sat 90-92 % on 2 L/min O2 by nasal cannula. Cardiac cath with normal coronaries yesterday.  Creatinine improving and down to 1.07  Objective:  Vital Signs in the last 24 hours: Temp:  [97.9 F (36.6 C)-99.1 F (37.3 C)] 98 F (36.7 C) (10/30 0803) Pulse Rate:  [77-102] 84 (10/30 0803) Cardiac Rhythm: Normal sinus rhythm (10/30 0855) Resp:  [12-20] 20 (10/30 0803) BP: (88-120)/(55-86) 103/55 (10/30 0803) SpO2:  [89 %-97 %] 90 % (10/30 0803)  Physical Exam: BP Readings from Last 1 Encounters:  04/16/20 (!) 103/55     Wt Readings from Last 1 Encounters:  04/11/20 96.1 kg    Weight change:  Body mass index is 37.53 kg/m. HEENT: [place]/AT, Eyes-Brown, PERL, EOMI, Conjunctiva-Pink, Sclera-Non-icteric Neck: No JVD, No bruit, Trachea midline. Lungs:  Clear, Bilateral. Cardiac:  Regular rhythm, normal S1 and S2, no S3. II/VI systolic murmur. Abdomen:  Soft, non-tender. BS present. Extremities:  No edema present. No cyanosis. No clubbing. CNS: AxOx3, Cranial nerves grossly intact, moves all 4 extremities.  Skin: Warm and dry.   Intake/Output from previous day: 10/29 0701 - 10/30 0700 In: 646.7 [P.O.:240; I.V.:406.7] Out: -     Lab Results: BMET    Component Value Date/Time   NA 139 04/15/2020 0214   NA 138 04/14/2020 0034   NA 139 04/13/2020 0146   NA 144 03/02/2020 1204   NA 143 07/29/2019 1521   NA 141 03/24/2019 1509   K 3.5 04/15/2020 0214   K 3.8 04/14/2020 0034   K 3.9 04/13/2020 0146   CL 106 04/15/2020 0214   CL 105 04/14/2020 0034   CL 104 04/13/2020 0146   CO2 26 04/15/2020 0214   CO2 25 04/14/2020 0034   CO2 27 04/13/2020 0146   GLUCOSE 92 04/15/2020 0214   GLUCOSE 103 (H) 04/14/2020 0034   GLUCOSE 89 04/13/2020 0146   BUN 29 (H) 04/15/2020 0214   BUN 40 (H) 04/14/2020 0034   BUN 44 (H) 04/13/2020 0146   BUN 19 03/02/2020 1204   BUN 16 07/29/2019 1521   BUN 19 03/24/2019 1509   CREATININE  1.07 (H) 04/15/2020 1047   CREATININE 1.14 (H) 04/15/2020 0214   CREATININE 1.47 (H) 04/14/2020 0034   CREATININE 1.37 (H) 10/21/2019 1126   CREATININE 1.16 (H) 12/15/2018 1459   CREATININE 1.17 (H) 01/28/2018 1234   CALCIUM 9.6 04/15/2020 0214   CALCIUM 9.9 04/14/2020 0034   CALCIUM 10.1 04/13/2020 0146   GFRNONAA 55 (L) 04/15/2020 1047   GFRNONAA 51 (L) 04/15/2020 0214   GFRNONAA 37 (L) 04/14/2020 0034   GFRNONAA 38 (L) 10/21/2019 1126   GFRNONAA 47 (L) 12/15/2018 1459   GFRNONAA 47 (L) 01/28/2018 1234   GFRAA 48 (L) 03/02/2020 1204   GFRAA 44 (L) 10/21/2019 1126   GFRAA 52 (L) 07/29/2019 1521   GFRAA 48 (L) 03/24/2019 1509   GFRAA 54 (L) 12/15/2018 1459   GFRAA 54 (L) 01/28/2018 1234   CBC    Component Value Date/Time   WBC 11.7 (H) 04/16/2020 0420   RBC 4.04 04/16/2020 0420   HGB 12.4 04/16/2020 0420   HCT 37.0 04/16/2020 0420   PLT 130 (L) 04/16/2020 0420   MCV 91.6 04/16/2020 0420   MCH 30.7 04/16/2020 0420   MCHC 33.5 04/16/2020 0420   RDW 17.0 (H) 04/16/2020 0420   LYMPHSABS 1.2 04/12/2020 2200   MONOABS 0.5 04/12/2020 2200   EOSABS 0.1 04/12/2020 2200  BASOSABS 0.0 04/12/2020 2200   HEPATIC Function Panel Recent Labs    04/06/20 0357 04/07/20 0625 04/12/20 2200  PROT 5.3* 5.7* 4.8*   HEMOGLOBIN A1C No components found for: HGA1C,  MPG CARDIAC ENZYMES No results found for: CKTOTAL, CKMB, CKMBINDEX, TROPONINI BNP No results for input(s): PROBNP in the last 8760 hours. TSH No results for input(s): TSH in the last 8760 hours. CHOLESTEROL Recent Labs    03/02/20 1204  CHOL 161    Scheduled Meds: . (feeding supplement) PROSource Plus  30 mL Oral BID BM  . vitamin C  500 mg Oral Daily  . aspirin EC  81 mg Oral Daily  . atorvastatin  10 mg Oral Daily  . cholecalciferol  2,000 Units Oral Daily  . diclofenac Sodium  2 g Topical BID  . diphenhydrAMINE  12.5 mg Intravenous Once  . feeding supplement  1 Container Oral TID BM  . heparin  5,000 Units  Subcutaneous Q8H  . hydroxychloroquine  200 mg Oral q morning - 10a  . mouth rinse  15 mL Mouth Rinse BID  . methylPREDNISolone (SOLU-MEDROL) injection  125 mg Intravenous Once  . metoprolol tartrate  12.5 mg Oral BID  . oxybutynin  10 mg Oral QHS  . pantoprazole  40 mg Oral Daily  . sodium chloride flush  3 mL Intravenous Q12H  . sodium chloride flush  3 mL Intravenous Q12H  . traZODone  50 mg Oral QHS  . zinc sulfate  220 mg Oral Daily   Continuous Infusions: . sodium chloride     PRN Meds:.sodium chloride, acetaminophen, baclofen, bisacodyl, chlorpheniramine-HYDROcodone, guaiFENesin-dextromethorphan, hydrALAZINE, hydroxypropyl methylcellulose / hypromellose, Ipratropium-Albuterol, magnesium citrate, morphine injection, nitroGLYCERIN, [DISCONTINUED] ondansetron **OR** ondansetron (ZOFRAN) IV, polyethylene glycol, simethicone, sodium chloride, sodium chloride flush, zolpidem  Assessment/Plan: Abnormal troponin I from demand ischemia with type 2 MI COVID-19 pneumonia with hypoxemia Hypothyroidism Obesity CKD, II  May discharge home. F/U in 1-2 weeks.   LOS: 23 days   Time spent including chart review, lab review, examination, discussion with patient : 30 min   Dixie Dials  MD  04/16/2020, 9:33 AM

## 2020-04-16 NOTE — Discharge Summary (Signed)
PATIENT DETAILS Name: Amanda Ross Age: 74 y.o. Sex: female Date of Birth: 03-09-1946 MRN: [id number]. Admitting Physician: Mercy Riding, MD [id number], Bailey Mech, MD  Admit Date: 03/24/2020 Discharge date: 04/16/2020  Recommendations for Outpatient Follow-up:  1. Follow up with PCP in 1-2 weeks 2. Please obtain CMP/CBC in one week 3. Repeat Chest Xray in 4-6 week 4. Please reassess on follow-up if patient still requires oxygen.  Admitted From:  Home  Disposition: Home with home health services   Home Health: Yes  Equipment/Devices: Home O2-2 L at rest-4 L with ambulation.  Discharge Condition: Stable  CODE STATUS: FULL CODE  Diet recommendation:  Diet Order            Diet - low sodium heart healthy           Diet Heart Room service appropriate? Yes; Fluid consistency: Thin  Diet effective now                 Brief Narrative: Patient is a 74 y.o. female with PMHx of Sjogren's syndrome, HTN, asthma, left thyroid nodule-presented to the ED on 10/7 with acute hypoxic respiratory failure due to COVID-19 pneumonia.  On initial presentation-had mild hypoxemia-however developed worsening hypoxemia requiring HFNC.  COVID-19 vaccinated status: Vaccinated  Significant Events: 10/7>> Admit to [facility] for mild hypoxemia due to COVID-19 pneumonia 10/12>> worsening hypoxemia-s/p Actemra 10/25>> chest pain requiring cardiology consultation  Significant studies: 10/7>>Chest x-ray: No acute disease 10/8>> CTA chest: No PE, multifocal pneumonia 10/9>> lower extremity Doppler: No DVT 10/11>> chest x-ray: Multifocal airspace opacities 10/17>> bilateral pulmonary opacities 10/20>> Echo: EF 60-65%  COVID-19 medications: Steroids: 10/7>>10/26 Remdesivir: 10/7>> 10/11 Actemra: 10/12 x 1  Antibiotics: None  Microbiology data: 10/7 >>blood culture: 1/2  staph epidermidis 10/8>> blood culture: No growth  Procedures: 10/29>> LHC: No significant  CAD  Consults: Cardiology   Brief Hospital Course: Acute Hypoxic Resp Failure due to Covid 19 Viral pneumonia:  Significantly better-requiring only around 2 L of oxygen at rest-requires around 4 L with ambulation.  Has completed more than 21 days of isolation-does not require any further isolation or treatment for COVID-19 pneumonia.  Was treated with a course of steroids/remdesivir and 1 dose of Actemra.    COVID-19 Labs:  No results for input(s): DDIMER, FERRITIN, LDH, CRP in the last 72 hours.  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  Remains on aspirin/statin/beta-blocker.    AKI on CKD stage IIIb: AKI hemodynamically mediated-significantly improved-follow electrolytes periodically.    Asthma: Stable-continue bronchodilators.  HTN: BP has been soft for the past several days-but has been steadily stabilizing in the past 24 hours-cautiously continue with metoprolol.  Reassess at next visit with PCP/cardiology. Marland Kitchen   History of overactive bladder: Continue oxybutynin  History of Sjogren's syndrome: Continue Plaquenil-on artificial tears.  Thrombocytopenia: Mild-stable for follow-up without further work-up.  OSA  Morbid Obesity: Estimated body mass index is 37.53 kg/m as calculated from the following:   Height as of this encounter: 5\' 3"  (1.6 m).   Weight as of this encounter: 96.1 kg.    Discharge Diagnoses:  Active Problems:   Acute hypoxemic respiratory failure due to COVID-19 [facility])   Discharge Instructions:    Person Under Monitoring Name: Amanda Ross  Location: [address] [place] [address]   Infection Prevention Recommendations for  Individuals Confirmed to have, or Being Evaluated for, 2019 Novel Coronavirus (COVID-19) Infection Who Receive  Care at Home  Individuals who are confirmed to have, or are being evaluated for, COVID-19 should follow the prevention steps below until a healthcare provider or local or state health department says they can return to normal activities.  Stay home except to get medical care You should restrict activities outside your home, except for getting medical care. Do not go to work, school, or public areas, and do not use public transportation or taxis.  Call ahead before visiting your doctor Before your medical appointment, call the healthcare provider and tell them that you have, or are being evaluated for, COVID-19 infection. This will help the healthcare provider's office take steps to keep other people from getting infected. Ask your healthcare provider to call the local or state health department.  Monitor your symptoms Seek prompt medical attention if your illness is worsening (e.g., difficulty breathing). Before going to your medical appointment, call the healthcare provider and tell them that you have, or are being evaluated for, COVID-19 infection. Ask your healthcare provider to call the local or state health department.  Wear a facemask You should wear a facemask that covers your nose and mouth when you are in the same room with other people and when you visit a healthcare provider. People who live with or visit you should also wear a facemask while they are in the same room with you.  Separate yourself from other people in your home As much as possible, you should stay in a different room from other people in your home. Also, you should use a separate bathroom, if available.  Avoid sharing household items You should not share dishes, drinking glasses, cups, eating utensils, towels, bedding, or other items with other people in your home. After using these items, you should wash them thoroughly with soap and water.  Cover your coughs and sneezes Cover your mouth and nose with a  tissue when you cough or sneeze, or you can cough or sneeze into your sleeve. Throw used tissues in a lined trash can, and immediately wash your hands with soap and water for at least 20 seconds or use an alcohol-based hand rub.  Wash your [facility] your hands often and thoroughly with soap and water for at least 20 seconds. You can use an alcohol-based hand sanitizer if soap and water are not available and if your hands are not visibly dirty. Avoid touching your eyes, nose, and mouth with unwashed hands.   Prevention Steps for Caregivers and Household Members of Individuals Confirmed to have, or Being Evaluated for, COVID-19 Infection Being Cared for in the Home  If you live with, or provide care at home for, a person confirmed to have, or being evaluated for, COVID-19 infection please follow these guidelines to prevent infection:  Follow healthcare provider's instructions Make sure that you understand and can help the patient follow any healthcare provider instructions for all care.  Provide for the patient's basic needs You should help the patient with basic needs in the home and provide support for getting groceries, prescriptions, and other personal needs.  Monitor the patient's symptoms If they are getting sicker, call his or her medical provider and tell them that the patient has, or is being evaluated for, COVID-19 infection. This will help the healthcare provider's office take steps to keep other people from getting infected. Ask the healthcare provider to call the local or state health department.  Limit the number of people who have contact with the patient  If possible, have only one caregiver for the patient.  Other household members should stay in another home or place of residence. If this is not possible, they should stay  in another room, or be separated from the patient as much as possible. Use a separate bathroom, if available.  Restrict visitors who do not  have an essential need to be in the home.  Keep older adults, very young children, and other sick people away from the patient Keep older adults, very young children, and those who have compromised immune systems or chronic health conditions away from the patient. This includes people with chronic heart, lung, or kidney conditions, diabetes, and cancer.  Ensure good ventilation Make sure that shared spaces in the home have good air flow, such as from an air conditioner or an opened window, weather permitting.  Wash your hands often  Wash your hands often and thoroughly with soap and water for at least 20 seconds. You can use an alcohol based hand sanitizer if soap and water are not available and if your hands are not visibly dirty.  Avoid touching your eyes, nose, and mouth with unwashed hands.  Use disposable paper towels to dry your hands. If not available, use dedicated cloth towels and replace them when they become wet.  Wear a facemask and gloves  Wear a disposable facemask at all times in the room and gloves when you touch or have contact with the patient's blood, body fluids, and/or secretions or excretions, such as sweat, saliva, sputum, nasal mucus, vomit, urine, or feces.  Ensure the mask fits over your nose and mouth tightly, and do not touch it during use.  Throw out disposable facemasks and gloves after using them. Do not reuse.  Wash your hands immediately after removing your facemask and gloves.  If your personal clothing becomes contaminated, carefully remove clothing and launder. Wash your hands after handling contaminated clothing.  Place all used disposable facemasks, gloves, and other waste in a lined container before disposing them with other household waste.  Remove gloves and wash your hands immediately after handling these items.  Do not share dishes, glasses, or other household items with the patient  Avoid sharing household items. You should not share  dishes, drinking glasses, cups, eating utensils, towels, bedding, or other items with a patient who is confirmed to have, or being evaluated for, COVID-19 infection.  After the person uses these items, you should wash them thoroughly with soap and water.  Wash laundry thoroughly  Immediately remove and wash clothes or bedding that have blood, body fluids, and/or secretions or excretions, such as sweat, saliva, sputum, nasal mucus, vomit, urine, or feces, on them.  Wear gloves when handling laundry from the patient.  Read and follow directions on labels of laundry or clothing items and detergent. In general, wash and dry with the warmest temperatures recommended on the label.  Clean all areas the individual has used often  Clean all touchable surfaces, such as counters, tabletops, doorknobs, bathroom fixtures, toilets, phones, keyboards, tablets, and bedside tables, every day. Also, clean any surfaces that may have blood, body fluids, and/or secretions or excretions on them.  Wear gloves when cleaning surfaces the patient has come in contact with.  Use a diluted bleach solution (e.g., dilute bleach with 1 part bleach and 10 parts water) or a household disinfectant with a label that says EPA-registered for coronaviruses. To make a bleach solution  at home, add 1 tablespoon of bleach to 1 quart (4 cups) of water. For a larger supply, add  cup of bleach to 1 gallon (16 cups) of water.  Read labels of cleaning products and follow recommendations provided on product labels. Labels contain instructions for safe and effective use of the cleaning product including precautions you should take when applying the product, such as wearing gloves or eye protection and making sure you have good ventilation during use of the product.  Remove gloves and wash hands immediately after cleaning.  Monitor yourself for signs and symptoms of illness Caregivers and household members are considered close contacts,  should monitor their health, and will be asked to limit movement outside of the home to the extent possible. Follow the monitoring steps for close contacts listed on the symptom monitoring form.   ? If you have additional questions, contact your local health department or call the epidemiologist on call at [phone] (available 24/7). ? This guidance is subject to change. For the most up-to-date guidance from CDC, please refer to their website: YouBlogs.pl    Activity:  As tolerated with Full fall precautions use walker/cane & assistance as needed  Discharge Instructions    Call MD for:  difficulty breathing, headache or visual disturbances   Complete by: As directed    Diet - low sodium heart healthy   Complete by: As directed    Discharge instructions   Complete by: As directed    Follow with Primary MD  [name], MD in 1-2 weeks  Please ask your primary care practitioner to repeat a two-view chest x-ray in 4 to 6 weeks.  Please ask your primary care practitioner to reassess whether you still require home O2 at next visit.  Please get a complete blood count and chemistry panel checked by your Primary MD at your next visit, and again as instructed by your Primary MD.  Get Medicines reviewed and adjusted: Please take all your medications with you for your next visit with your Primary MD  Laboratory/radiological data: Please request your Primary MD to go over all hospital tests and procedure/radiological results at the follow up, please ask your Primary MD to get all Hospital records sent to his/her office.  In some cases, they will be blood work, cultures and biopsy results pending at the time of your discharge. Please request that your primary care M.D. follows up on these results.  Also Note the following: If you experience worsening of your admission symptoms, develop shortness of breath, life threatening  emergency, suicidal or homicidal thoughts you must seek medical attention immediately by calling 911 or calling your MD immediately  if symptoms less severe.  You must read complete instructions/literature along with all the possible adverse reactions/side effects for all the Medicines you take and that have been prescribed to you. Take any new Medicines after you have completely understood and accpet all the possible adverse reactions/side effects.   Do not drive when taking Pain medications or sleeping medications (Benzodaizepines)  Do not take more than prescribed Pain, Sleep and Anxiety Medications. It is not advisable to combine anxiety,sleep and pain medications without talking with your primary care practitioner  Special Instructions: If you have smoked or chewed Tobacco  in the last 2 yrs please stop smoking, stop any regular Alcohol  and or any Recreational drug use.  Wear Seat belts while driving.  Please note: You were cared for by a hospitalist during your hospital stay. Once you are discharged, your  primary care physician will handle any further medical issues. Please note that NO REFILLS for any discharge medications will be authorized once you are discharged, as it is imperative that you return to your primary care physician (or establish a relationship with a primary care physician if you do not have one) for your post hospital discharge needs so that they can reassess your need for medications and monitor your lab values.   Increase activity slowly   Complete by: As directed      Allergies as of 04/16/2020      Reactions   Ivp Dye [iodinated Diagnostic Agents] Hives   Percocet [oxycodone-acetaminophen] Nausea And Vomiting   Iohexol    Penicillins Hives   Did it involve swelling of the face/tongue/throat, SOB, or low BP? N Did it involve sudden or severe rash/hives, skin peeling, or any reaction on the inside of your mouth or nose? Y Did you need to seek medical attention at  a hospital or doctor's office? N When did it last happen?Several Years Ago If all above answers are "NO", may proceed with cephalosporin use.   Prednisone Nausea Only      Medication List    STOP taking these medications   azithromycin 250 MG tablet Commonly known as: ZITHROMAX   guaiFENesin 100 MG/5ML liquid Commonly known as: ROBITUSSIN   losartan-hydrochlorothiazide 50-12.5 MG tablet Commonly known as: HYZAAR   mirabegron ER 25 MG Tb24 tablet Commonly known as: Myrbetriq     TAKE these medications   (feeding supplement) PROSource Plus liquid Take 30 mLs by mouth 2 (two) times daily between meals.   albuterol 108 (90 Base) MCG/ACT inhaler Commonly known as: VENTOLIN HFA Inhale 1-2 puffs into the lungs every 6 (six) hours as needed for wheezing or shortness of breath.   aspirin 81 MG tablet Take 81 mg by mouth daily.   atorvastatin 10 MG tablet Commonly known as: LIPITOR Take 1 tablet (10 mg total) by mouth daily.   baclofen 10 MG tablet Commonly known as: LIORESAL TAKE 1/2 TABLET BY MOUTH TWICE DAILY AS NEEDED What changed: See the new instructions.   diclofenac Sodium 1 % Gel Commonly known as: VOLTAREN Apply 2 g to 4 g to affected area up to 4 times daily as needed. What changed:   how much to take  how to take this  when to take this  reasons to take this  additional instructions   hydroxychloroquine 200 MG tablet Commonly known as: PLAQUENIL Take 1 tablet (200 mg total) by mouth every morning.   ipratropium 0.03 % nasal spray Commonly known as: ATROVENT Place 2 sprays into both nostrils as needed.   magnesium gluconate 500 MG tablet Commonly known as: MAGONATE Take 500 mg by mouth daily.   metoprolol tartrate 25 MG tablet Commonly known as: LOPRESSOR TAKE 1/2 TABLET BY MOUTH TWICE DAILY   omeprazole 20 MG capsule Commonly known as: PRILOSEC TAKE 2 CAPSULES(40 MG) BY MOUTH DAILY What changed: See the new instructions.     oxybutynin 10 MG 24 hr tablet Commonly known as: DITROPAN-XL Take 10 mg by mouth at bedtime.   Potassium 99 MG Tabs Take 1 tablet by mouth daily.   traZODone 50 MG tablet Commonly known as: DESYREL TAKE 1 TABLET BY MOUTH EVERY NIGHT AT BEDTIME   Vitamin D 1000 units capsule Take 2,000 Units by mouth daily.            Durable Medical Equipment  (From admission, onward)  Start     Ordered   03/28/20 0900  For home use only DME oxygen  Once       Question Answer Comment  Length of Need 6 Months   Mode or (Route) Nasal cannula   Liters per Minute 4   Frequency Continuous (stationary and portable oxygen unit needed)   Oxygen conserving device Yes   Oxygen delivery system Gas      03/28/20 0859          Follow-up Information    Dixie Dials, MD. Schedule an appointment as soon as possible for a visit in 1 week(s).   Specialty: Cardiology Contact information: [address] [place] [address] [place], [facility] Follow up.   Why: Oxygen to be delivered to the room prior to transition home.  Contact information: [address] [place] [address] [phone]        [name], MD. Schedule an appointment as soon as possible for a visit in 1 week(s).   Specialty: Internal Medicine Contact information: [address] [place] [address] [phone]              Allergies  Allergen Reactions  . Ivp Dye [Iodinated Diagnostic Agents] Hives  . Percocet [Oxycodone-Acetaminophen] Nausea And Vomiting  . Iohexol   . Penicillins Hives    Did it involve swelling of the face/tongue/throat, SOB, or low BP? N Did it involve sudden or severe rash/hives, skin peeling, or any reaction on the inside of your mouth or nose? Y Did you need to seek medical attention at a hospital or doctor's office? N When did it last happen?Several Years Ago If all above answers are "NO", may proceed with  cephalosporin use.   . Prednisone Nausea Only     Other Procedures/Studies: DG Chest 2 View  Result Date: 03/23/2020 CLINICAL DATA:  Shortness of breath EXAM: CHEST - 2 VIEW COMPARISON:  February 25, 2018 FINDINGS: There is mild scarring in the left mid lung region. There is interstitial thickening in portions of the mid and lower lung regions bilaterally. No edema or airspace opacity. Heart size and pulmonary vascular normal. There is aortic atherosclerosis. Postoperative change noted in right shoulder. There is degenerative change in the thoracic spine. IMPRESSION: Suspect a degree of underlying chronic bronchitis. Slight scarring left mid lung. No edema or airspace opacity. Stable cardiac silhouette. Aortic Atherosclerosis (ICD10-I70.0). Electronically Signed   By: Lowella Grip III M.D.   On: 03/23/2020 12:44   CT ANGIO CHEST PE W OR WO CONTRAST  Result Date: 03/25/2020 CLINICAL DATA:  Shortness of breath, covid positive EXAM: CT ANGIOGRAPHY CHEST WITH CONTRAST TECHNIQUE: Multidetector CT imaging of the chest was performed using the standard protocol during bolus administration of intravenous contrast. Multiplanar CT image reconstructions and MIPs were obtained to evaluate the vascular anatomy. CONTRAST:  179mL OMNIPAQUE IOHEXOL 350 MG/ML SOLN COMPARISON:  None. FINDINGS: Cardiovascular: There is a optimal opacification of the pulmonary arteries. There is no central,segmental, or subsegmental filling defects within the pulmonary arteries. The heart is normal in size. No pericardial effusion or thickening. No evidence right heart strain. There is normal three-vessel brachiocephalic anatomy without proximal stenosis. Scattered aortic atherosclerosis is noted. Mediastinum/Nodes: No hilar, mediastinal, or axillary adenopathy. Thyroid gland, trachea, and esophagus demonstrate no significant findings. Lungs/Pleura: Multifocal patchy airspace opacities are seen throughout both lungs, right greater  than left. No pleural effusion or pneumothorax. Upper Abdomen: No acute abnormalities present  in the visualized portions of the upper abdomen. Low-density lesions are seen within the left kidney and anterior left hepatic lobe. Musculoskeletal: No chest wall abnormality. No acute or significant osseous findings. Review of the MIP images confirms the above findings. IMPRESSION: No central, segmental, or subsegmental pulmonary embolism. Multifocal patchy airspace opacities, right greater than left, consistent with multifocal viral pneumonia Electronically Signed   By: Prudencio Pair M.D.   On: 03/25/2020 03:14   CARDIAC CATHETERIZATION  Result Date: 04/15/2020 Medical treatment for possible microvascular disease or demand ischemia. Low dose aspirin as tolerated.  DG Chest Port 1 View  Result Date: 04/03/2020 CLINICAL DATA:  Shortness of breath. EXAM: PORTABLE CHEST 1 VIEW COMPARISON:  03/31/2020 chest radiograph and prior. FINDINGS: Bilateral mid to lower lung patchy opacities are more conspicuous than prior exam. No pneumothorax or pleural effusion. Mild cardiomegaly and aortic arch atherosclerotic calcifications. Osseous structures are unchanged. IMPRESSION: Slightly increased conspicuity of bilateral pulmonary opacities concerning for multifocal pneumonia. Electronically Signed   By: Primitivo Gauze M.D.   On: 04/03/2020 08:24   DG Chest Port 1 View  Result Date: 03/31/2020 CLINICAL DATA:  Shortness of breath. EXAM: PORTABLE CHEST 1 VIEW COMPARISON:  03/28/2020 FINDINGS: Mild cardiomegaly stable. Aortic atherosclerotic calcification noted. Pulmonary infiltrate is again seen at the right base, with mild worsening since previous study. There is also mild airspace disease in the peripheral left mid lung which is unchanged in appearance. No definite pleural effusion or pneumothorax. IMPRESSION: Mild worsening of right basilar pulmonary infiltrate. Stable mild airspace disease in peripheral left mid  lung. Electronically Signed   By: Marlaine Hind M.D.   On: 03/31/2020 08:07   DG Chest Port 1 View  Result Date: 03/28/2020 CLINICAL DATA:  Shortness of breath.  Reported COVID-19 positive EXAM: PORTABLE CHEST 1 VIEW COMPARISON:  Chest CT March 25, 2020; chest radiograph March 24, 2020 FINDINGS: There is patchy airspace opacity in the mid and lower lung zones bilaterally, most notable in the right lower lung region. There appears to be slightly less opacity in the upper lobes compared to recent CT. No consolidation. Heart is upper normal in size with pulmonary vascularity normal. No adenopathy. No bone lesions. There is aortic atherosclerosis. IMPRESSION: Multifocal airspace opacity, likely representing atypical organism multifocal pneumonia. Slightly less opacity in the upper lobes compared to recent CT examination. Other areas appear essentially stable. No new opacities evident. Stable cardiac silhouette.  No evident adenopathy. Aortic Atherosclerosis (ICD10-I70.0). Electronically Signed   By: Lowella Grip III M.D.   On: 03/28/2020 10:12   DG Chest Portable 1 View  Result Date: 03/24/2020 CLINICAL DATA:  Body aches and shortness of breath. EXAM: PORTABLE CHEST 1 VIEW COMPARISON:  Single-view of the chest 02/25/2018. PA and lateral chest 03/23/2020. FINDINGS: There is some coarsening of the pulmonary interstitium. No consolidative process, pneumothorax or effusion. Aortic atherosclerosis. No acute or focal bony abnormality. IMPRESSION: No acute disease. Aortic Atherosclerosis (ICD10-I70.0). Electronically Signed   By: Inge Rise M.D.   On: 03/24/2020 11:54   VAS [place] LOWER EXTREMITY VENOUS (DVT)  Result Date: 03/27/2020  Lower Venous DVTStudy Indications: COVID-19 positive. Elevated d-dimer.  Comparison Study: No prior study Performing Technologist: Maudry Mayhew MHA, RDMS, RVT, RDCS  Examination Guidelines: A complete evaluation includes B-mode imaging, spectral Doppler, color Doppler,  and power Doppler as needed of all accessible portions of each vessel. Bilateral testing is considered an integral part of a complete examination. Limited examinations for reoccurring indications may be performed as noted.  The reflux portion of the exam is performed with the patient in reverse Trendelenburg.  +---------+---------------+---------+-----------+----------+--------------+ RIGHT    CompressibilityPhasicitySpontaneityPropertiesThrombus Aging +---------+---------------+---------+-----------+----------+--------------+ CFV      Full           Yes      Yes                                 +---------+---------------+---------+-----------+----------+--------------+ SFJ      Full                                                        +---------+---------------+---------+-----------+----------+--------------+ FV Prox  Full                                                        +---------+---------------+---------+-----------+----------+--------------+ FV Mid   Full                                                        +---------+---------------+---------+-----------+----------+--------------+ FV DistalFull                                                        +---------+---------------+---------+-----------+----------+--------------+ PFV      Full                                                        +---------+---------------+---------+-----------+----------+--------------+ POP      Full           Yes      Yes                                 +---------+---------------+---------+-----------+----------+--------------+ PTV      Full                                                        +---------+---------------+---------+-----------+----------+--------------+ PERO     Full                                                        +---------+---------------+---------+-----------+----------+--------------+    +---------+---------------+---------+-----------+----------+--------------+ LEFT     CompressibilityPhasicitySpontaneityPropertiesThrombus Aging +---------+---------------+---------+-----------+----------+--------------+ CFV      Full           Yes  Yes                                 +---------+---------------+---------+-----------+----------+--------------+ SFJ      Full                                                        +---------+---------------+---------+-----------+----------+--------------+ FV Prox  Full                                                        +---------+---------------+---------+-----------+----------+--------------+ FV Mid   Full                                                        +---------+---------------+---------+-----------+----------+--------------+ FV DistalFull                                                        +---------+---------------+---------+-----------+----------+--------------+ PFV      Full                                                        +---------+---------------+---------+-----------+----------+--------------+ POP      Full           Yes      Yes                                 +---------+---------------+---------+-----------+----------+--------------+ PTV      Full                                                        +---------+---------------+---------+-----------+----------+--------------+ PERO     Full                                                        +---------+---------------+---------+-----------+----------+--------------+     Summary: RIGHT: - There is no evidence of deep vein thrombosis in the lower extremity.  - No cystic structure found in the popliteal fossa.  LEFT: - There is no evidence of deep vein thrombosis in the lower extremity.  - No cystic structure found in the popliteal fossa.  *See table(s) above for measurements and observations. Electronically signed  by Servando Snare MD on 03/27/2020 at 11:33:24 AM.    Final  ECHOCARDIOGRAM LIMITED  Result Date: 04/06/2020    ECHOCARDIOGRAM LIMITED REPORT   Patient Name:   Amanda Ross Date of Exam: 04/06/2020 Medical Rec #:  [id number]         Height:       63.0 in Accession #:    [id number]        Weight:       225.0 lb Date of Birth:  04-13-1946         BSA:          2.033 m Patient Age:    34 years          BP:           111/75 mmHg Patient Gender: F                 HR:           96 bpm. Exam Location:  Inpatient Procedure: Limited Echo, Cardiac Doppler, Color Doppler and Intracardiac            Opacification Agent Indications:    Dyspnea  History:        Patient has no prior history of Echocardiogram examinations.                 Risk Factors:Dyslipidemia and Former Smoker. COVID-19.  Sonographer:    Clayton Lefort RDCS (AE) Referring Phys: [id number] DAWOOD Graciela Husbands  Sonographer Comments: Technically challenging study due to limited acoustic windows, Technically difficult study due to poor echo windows, suboptimal parasternal window, suboptimal apical window, suboptimal subcostal window and patient is morbidly obese.  COVID-19. IMPRESSIONS  1. Left ventricular ejection fraction, by estimation, is 60 to 65%. The left ventricle has normal function.  2. Right ventricular systolic function was not well visualized. The right ventricular size is moderately enlarged.  3. The mitral valve was not well visualized.  4. The aortic valve was not well visualized.  5. The inferior vena cava is normal in size with <50% respiratory variability, suggesting right atrial pressure of 8 mmHg. Comparison(s): No prior Echocardiogram. Conclusion(s)/Recommendation(s): Technically challenging study with limited windows. Even with use of echo contrast, able to assess normal LVEF but limited for wall motion analysis. No significant valve disease by doppler, but not well visualized. FINDINGS  Left Ventricle: Left ventricular ejection fraction, by  estimation, is 60 to 65%. The left ventricle has normal function. Definity contrast agent was given IV to delineate the left ventricular endocardial borders. Right Ventricle: The right ventricular size is moderately enlarged. Right vetricular wall thickness was not well visualized. Right ventricular systolic function was not well visualized. Left Atrium: Left atrial size was not well visualized. Right Atrium: Right atrial size was not well visualized. Pericardium: The pericardium was not well visualized. Mitral Valve: The mitral valve was not well visualized. Tricuspid Valve: The tricuspid valve is not well visualized. Aortic Valve: The aortic valve was not well visualized. Pulmonic Valve: The pulmonic valve was not well visualized. Aorta: The aortic root was not well visualized, the ascending aorta was not well visualized and the aortic arch was not well visualized. Venous: The inferior vena cava is normal in size with less than 50% respiratory variability, suggesting right atrial pressure of 8 mmHg. LEFT VENTRICLE PLAX 2D LVOT diam:     2.10 cm  Diastology LVOT Area:     3.46 cm LV e' medial:    3.37 cm/s  LV E/e' medial:  11.1                         LV e' lateral:   4.46 cm/s                         LV E/e' lateral: 8.4  IVC IVC diam: 1.70 cm  AORTA Ao Root diam: 3.30 cm Ao Asc diam:  3.30 cm MITRAL VALVE MV Area (PHT): 3.68 cm    SHUNTS MV Decel Time: 206 msec    Systemic Diam: 2.10 cm MV E velocity: 37.30 cm/s MV A velocity: 72.40 cm/s MV E/A ratio:  0.52 Buford Dresser MD Electronically signed by Buford Dresser MD Signature Date/Time: 04/06/2020/2:33:32 PM    Final      TODAY-DAY OF DISCHARGE:  Subjective:   Amanda Ross today has no headache,no chest abdominal pain,no new weakness tingling or numbness, feels much better wants to go home today.   Objective:   Blood pressure (!) 103/55, pulse 84, temperature 98 F (36.7 C), temperature source Oral, resp.  rate 20, height 5\' 3"  (1.6 m), weight 96.1 kg, SpO2 90 %.  Intake/Output Summary (Last 24 hours) at 04/16/2020 1004 Last data filed at 04/15/2020 1700 Gross per 24 hour  Intake 646.67 ml  Output --  Net 646.67 ml   Filed Weights   03/24/20 1033 04/11/20 1605  Weight: 102.1 kg 96.1 kg    Exam: Awake Alert, Oriented *3, No new F.N deficits, Normal affect [place].AT,PERRAL Supple Neck,No JVD, No cervical lymphadenopathy appriciated.  Symmetrical Chest wall movement, Good air movement bilaterally, CTAB RRR,No Gallops,Rubs or new Murmurs, No Parasternal Heave +ve B.Sounds, Abd Soft, Non tender, No organomegaly appriciated, No rebound -guarding or rigidity. No Cyanosis, Clubbing or edema, No new Rash or bruise   PERTINENT RADIOLOGIC STUDIES: DG Chest 2 View  Result Date: 03/23/2020 CLINICAL DATA:  Shortness of breath EXAM: CHEST - 2 VIEW COMPARISON:  February 25, 2018 FINDINGS: There is mild scarring in the left mid lung region. There is interstitial thickening in portions of the mid and lower lung regions bilaterally. No edema or airspace opacity. Heart size and pulmonary vascular normal. There is aortic atherosclerosis. Postoperative change noted in right shoulder. There is degenerative change in the thoracic spine. IMPRESSION: Suspect a degree of underlying chronic bronchitis. Slight scarring left mid lung. No edema or airspace opacity. Stable cardiac silhouette. Aortic Atherosclerosis (ICD10-I70.0). Electronically Signed   By: Lowella Grip III M.D.   On: 03/23/2020 12:44   CT ANGIO CHEST PE W OR WO CONTRAST  Result Date: 03/25/2020 CLINICAL DATA:  Shortness of breath, covid positive EXAM: CT ANGIOGRAPHY CHEST WITH CONTRAST TECHNIQUE: Multidetector CT imaging of the chest was performed using the standard protocol during bolus administration of intravenous contrast. Multiplanar CT image reconstructions and MIPs were obtained to evaluate the vascular anatomy. CONTRAST:  120mL OMNIPAQUE  IOHEXOL 350 MG/ML SOLN COMPARISON:  None. FINDINGS: Cardiovascular: There is a optimal opacification of the pulmonary arteries. There is no central,segmental, or subsegmental filling defects within the pulmonary arteries. The heart is normal in size. No pericardial effusion or thickening. No evidence right heart strain. There is normal three-vessel brachiocephalic anatomy without proximal stenosis. Scattered aortic atherosclerosis is noted. Mediastinum/Nodes: No hilar, mediastinal, or axillary adenopathy. Thyroid gland, trachea, and esophagus demonstrate no significant findings. Lungs/Pleura: Multifocal patchy airspace opacities are seen throughout both lungs, right greater than left. No pleural effusion or pneumothorax. Upper Abdomen:  No acute abnormalities present in the visualized portions of the upper abdomen. Low-density lesions are seen within the left kidney and anterior left hepatic lobe. Musculoskeletal: No chest wall abnormality. No acute or significant osseous findings. Review of the MIP images confirms the above findings. IMPRESSION: No central, segmental, or subsegmental pulmonary embolism. Multifocal patchy airspace opacities, right greater than left, consistent with multifocal viral pneumonia Electronically Signed   By: Prudencio Pair M.D.   On: 03/25/2020 03:14   CARDIAC CATHETERIZATION  Result Date: 04/15/2020 Medical treatment for possible microvascular disease or demand ischemia. Low dose aspirin as tolerated.  DG Chest Port 1 View  Result Date: 04/03/2020 CLINICAL DATA:  Shortness of breath. EXAM: PORTABLE CHEST 1 VIEW COMPARISON:  03/31/2020 chest radiograph and prior. FINDINGS: Bilateral mid to lower lung patchy opacities are more conspicuous than prior exam. No pneumothorax or pleural effusion. Mild cardiomegaly and aortic arch atherosclerotic calcifications. Osseous structures are unchanged. IMPRESSION: Slightly increased conspicuity of bilateral pulmonary opacities concerning for  multifocal pneumonia. Electronically Signed   By: Primitivo Gauze M.D.   On: 04/03/2020 08:24   DG Chest Port 1 View  Result Date: 03/31/2020 CLINICAL DATA:  Shortness of breath. EXAM: PORTABLE CHEST 1 VIEW COMPARISON:  03/28/2020 FINDINGS: Mild cardiomegaly stable. Aortic atherosclerotic calcification noted. Pulmonary infiltrate is again seen at the right base, with mild worsening since previous study. There is also mild airspace disease in the peripheral left mid lung which is unchanged in appearance. No definite pleural effusion or pneumothorax. IMPRESSION: Mild worsening of right basilar pulmonary infiltrate. Stable mild airspace disease in peripheral left mid lung. Electronically Signed   By: Marlaine Hind M.D.   On: 03/31/2020 08:07   DG Chest Port 1 View  Result Date: 03/28/2020 CLINICAL DATA:  Shortness of breath.  Reported COVID-19 positive EXAM: PORTABLE CHEST 1 VIEW COMPARISON:  Chest CT March 25, 2020; chest radiograph March 24, 2020 FINDINGS: There is patchy airspace opacity in the mid and lower lung zones bilaterally, most notable in the right lower lung region. There appears to be slightly less opacity in the upper lobes compared to recent CT. No consolidation. Heart is upper normal in size with pulmonary vascularity normal. No adenopathy. No bone lesions. There is aortic atherosclerosis. IMPRESSION: Multifocal airspace opacity, likely representing atypical organism multifocal pneumonia. Slightly less opacity in the upper lobes compared to recent CT examination. Other areas appear essentially stable. No new opacities evident. Stable cardiac silhouette.  No evident adenopathy. Aortic Atherosclerosis (ICD10-I70.0). Electronically Signed   By: Lowella Grip III M.D.   On: 03/28/2020 10:12   DG Chest Portable 1 View  Result Date: 03/24/2020 CLINICAL DATA:  Body aches and shortness of breath. EXAM: PORTABLE CHEST 1 VIEW COMPARISON:  Single-view of the chest 02/25/2018. PA and lateral  chest 03/23/2020. FINDINGS: There is some coarsening of the pulmonary interstitium. No consolidative process, pneumothorax or effusion. Aortic atherosclerosis. No acute or focal bony abnormality. IMPRESSION: No acute disease. Aortic Atherosclerosis (ICD10-I70.0). Electronically Signed   By: Inge Rise M.D.   On: 03/24/2020 11:54   VAS [place] LOWER EXTREMITY VENOUS (DVT)  Result Date: 03/27/2020  Lower Venous DVTStudy Indications: COVID-19 positive. Elevated d-dimer.  Comparison Study: No prior study Performing Technologist: Maudry Mayhew MHA, RDMS, RVT, RDCS  Examination Guidelines: A complete evaluation includes B-mode imaging, spectral Doppler, color Doppler, and power Doppler as needed of all accessible portions of each vessel. Bilateral testing is considered an integral part of a complete examination. Limited examinations for reoccurring indications may  be performed as noted. The reflux portion of the exam is performed with the patient in reverse Trendelenburg.  +---------+---------------+---------+-----------+----------+--------------+ RIGHT    CompressibilityPhasicitySpontaneityPropertiesThrombus Aging +---------+---------------+---------+-----------+----------+--------------+ CFV      Full           Yes      Yes                                 +---------+---------------+---------+-----------+----------+--------------+ SFJ      Full                                                        +---------+---------------+---------+-----------+----------+--------------+ FV Prox  Full                                                        +---------+---------------+---------+-----------+----------+--------------+ FV Mid   Full                                                        +---------+---------------+---------+-----------+----------+--------------+ FV DistalFull                                                         +---------+---------------+---------+-----------+----------+--------------+ PFV      Full                                                        +---------+---------------+---------+-----------+----------+--------------+ POP      Full           Yes      Yes                                 +---------+---------------+---------+-----------+----------+--------------+ PTV      Full                                                        +---------+---------------+---------+-----------+----------+--------------+ PERO     Full                                                        +---------+---------------+---------+-----------+----------+--------------+   +---------+---------------+---------+-----------+----------+--------------+ LEFT     CompressibilityPhasicitySpontaneityPropertiesThrombus Aging +---------+---------------+---------+-----------+----------+--------------+ CFV      Full  Yes      Yes                                 +---------+---------------+---------+-----------+----------+--------------+ SFJ      Full                                                        +---------+---------------+---------+-----------+----------+--------------+ FV Prox  Full                                                        +---------+---------------+---------+-----------+----------+--------------+ FV Mid   Full                                                        +---------+---------------+---------+-----------+----------+--------------+ FV DistalFull                                                        +---------+---------------+---------+-----------+----------+--------------+ PFV      Full                                                        +---------+---------------+---------+-----------+----------+--------------+ POP      Full           Yes      Yes                                  +---------+---------------+---------+-----------+----------+--------------+ PTV      Full                                                        +---------+---------------+---------+-----------+----------+--------------+ PERO     Full                                                        +---------+---------------+---------+-----------+----------+--------------+     Summary: RIGHT: - There is no evidence of deep vein thrombosis in the lower extremity.  - No cystic structure found in the popliteal fossa.  LEFT: - There is no evidence of deep vein thrombosis in the lower extremity.  - No cystic structure found in the popliteal fossa.  *See table(s) above for measurements and observations. Electronically signed by Servando Snare MD on 03/27/2020 at 11:33:24  AM.    Final    ECHOCARDIOGRAM LIMITED  Result Date: 04/06/2020    ECHOCARDIOGRAM LIMITED REPORT   Patient Name:   Amanda Ross Date of Exam: 04/06/2020 Medical Rec #:  [id number]         Height:       63.0 in Accession #:    [id number]        Weight:       225.0 lb Date of Birth:  04-04-1946         BSA:          2.033 m Patient Age:    67 years          BP:           111/75 mmHg Patient Gender: F                 HR:           96 bpm. Exam Location:  Inpatient Procedure: Limited Echo, Cardiac Doppler, Color Doppler and Intracardiac            Opacification Agent Indications:    Dyspnea  History:        Patient has no prior history of Echocardiogram examinations.                 Risk Factors:Dyslipidemia and Former Smoker. COVID-19.  Sonographer:    Clayton Lefort RDCS (AE) Referring Phys: [id number] DAWOOD Graciela Husbands  Sonographer Comments: Technically challenging study due to limited acoustic windows, Technically difficult study due to poor echo windows, suboptimal parasternal window, suboptimal apical window, suboptimal subcostal window and patient is morbidly obese.  COVID-19. IMPRESSIONS  1. Left ventricular ejection fraction, by estimation, is 60  to 65%. The left ventricle has normal function.  2. Right ventricular systolic function was not well visualized. The right ventricular size is moderately enlarged.  3. The mitral valve was not well visualized.  4. The aortic valve was not well visualized.  5. The inferior vena cava is normal in size with <50% respiratory variability, suggesting right atrial pressure of 8 mmHg. Comparison(s): No prior Echocardiogram. Conclusion(s)/Recommendation(s): Technically challenging study with limited windows. Even with use of echo contrast, able to assess normal LVEF but limited for wall motion analysis. No significant valve disease by doppler, but not well visualized. FINDINGS  Left Ventricle: Left ventricular ejection fraction, by estimation, is 60 to 65%. The left ventricle has normal function. Definity contrast agent was given IV to delineate the left ventricular endocardial borders. Right Ventricle: The right ventricular size is moderately enlarged. Right vetricular wall thickness was not well visualized. Right ventricular systolic function was not well visualized. Left Atrium: Left atrial size was not well visualized. Right Atrium: Right atrial size was not well visualized. Pericardium: The pericardium was not well visualized. Mitral Valve: The mitral valve was not well visualized. Tricuspid Valve: The tricuspid valve is not well visualized. Aortic Valve: The aortic valve was not well visualized. Pulmonic Valve: The pulmonic valve was not well visualized. Aorta: The aortic root was not well visualized, the ascending aorta was not well visualized and the aortic arch was not well visualized. Venous: The inferior vena cava is normal in size with less than 50% respiratory variability, suggesting right atrial pressure of 8 mmHg. LEFT VENTRICLE PLAX 2D LVOT diam:     2.10 cm  Diastology LVOT Area:     3.46 cm LV e' medial:    3.37 cm/s  LV E/e' medial:  11.1                         LV e' lateral:    4.46 cm/s                         LV E/e' lateral: 8.4  IVC IVC diam: 1.70 cm  AORTA Ao Root diam: 3.30 cm Ao Asc diam:  3.30 cm MITRAL VALVE MV Area (PHT): 3.68 cm    SHUNTS MV Decel Time: 206 msec    Systemic Diam: 2.10 cm MV E velocity: 37.30 cm/s MV A velocity: 72.40 cm/s MV E/A ratio:  0.52 Buford Dresser MD Electronically signed by Buford Dresser MD Signature Date/Time: 04/06/2020/2:33:32 PM    Final      PERTINENT LAB RESULTS: CBC: Recent Labs    04/15/20 1047 04/16/20 0420  WBC 8.4 11.7*  HGB 12.6 12.4  HCT 38.0 37.0  PLT 123* 130*   CMET CMP     Component Value Date/Time   NA 139 04/15/2020 0214   NA 144 03/02/2020 1204   K 3.5 04/15/2020 0214   CL 106 04/15/2020 0214   CO2 26 04/15/2020 0214   GLUCOSE 92 04/15/2020 0214   BUN 29 (H) 04/15/2020 0214   BUN 19 03/02/2020 1204   CREATININE 1.07 (H) 04/15/2020 1047   CREATININE 1.37 (H) 10/21/2019 1126   CALCIUM 9.6 04/15/2020 0214   PROT 4.8 (L) 04/12/2020 2200   PROT 6.2 03/02/2020 1204   ALBUMIN 2.7 (L) 04/12/2020 2200   ALBUMIN 4.1 03/02/2020 1204   AST 25 04/12/2020 2200   ALT 51 (H) 04/12/2020 2200   ALKPHOS 51 04/12/2020 2200   BILITOT 1.0 04/12/2020 2200   BILITOT 0.3 03/02/2020 1204   GFRNONAA 55 (L) 04/15/2020 1047   GFRNONAA 38 (L) 10/21/2019 1126   GFRAA 48 (L) 03/02/2020 1204   GFRAA 44 (L) 10/21/2019 1126    GFR Estimated Creatinine Clearance: 50.9 mL/min (A) (by C-G formula based on SCr of 1.07 mg/dL (H)). No results for input(s): LIPASE, AMYLASE in the last 72 hours. No results for input(s): CKTOTAL, CKMB, CKMBINDEX, TROPONINI in the last 72 hours. Invalid input(s): POCBNP No results for input(s): DDIMER in the last 72 hours. No results for input(s): HGBA1C in the last 72 hours. No results for input(s): CHOL, HDL, LDLCALC, TRIG, CHOLHDL, LDLDIRECT in the last 72 hours. No results for input(s): TSH, T4TOTAL, T3FREE, THYROIDAB in the last 72 hours.  Invalid input(s): FREET3 No  results for input(s): VITAMINB12, FOLATE, FERRITIN, TIBC, IRON, RETICCTPCT in the last 72 hours. Coags: No results for input(s): INR in the last 72 hours.  Invalid input(s): PT Microbiology: No results found for this or any previous visit (from the past 240 hour(s)).  FURTHER DISCHARGE INSTRUCTIONS:  Get Medicines reviewed and adjusted: Please take all your medications with you for your next visit with your Primary MD  Laboratory/radiological data: Please request your Primary MD to go over all hospital tests and procedure/radiological results at the follow up, please ask your Primary MD to get all Hospital records sent to his/her office.  In some cases, they will be blood work, cultures and biopsy results pending at the time of your discharge. Please request that your primary care M.D. goes through all the records of your hospital data and follows up on these results.  Also Note the following: If you experience worsening of your admission symptoms, develop shortness  of breath, life threatening emergency, suicidal or homicidal thoughts you must seek medical attention immediately by calling 911 or calling your MD immediately  if symptoms less severe.  You must read complete instructions/literature along with all the possible adverse reactions/side effects for all the Medicines you take and that have been prescribed to you. Take any new Medicines after you have completely understood and accpet all the possible adverse reactions/side effects.   Do not drive when taking Pain medications or sleeping medications (Benzodaizepines)  Do not take more than prescribed Pain, Sleep and Anxiety Medications. It is not advisable to combine anxiety,sleep and pain medications without talking with your primary care practitioner  Special Instructions: If you have smoked or chewed Tobacco  in the last 2 yrs please stop smoking, stop any regular Alcohol  and or any Recreational drug use.  Wear Seat belts while  driving.  Please note: You were cared for by a hospitalist during your hospital stay. Once you are discharged, your primary care physician will handle any further medical issues. Please note that NO REFILLS for any discharge medications will be authorized once you are discharged, as it is imperative that you return to your primary care physician (or establish a relationship with a primary care physician if you do not have one) for your post hospital discharge needs so that they can reassess your need for medications and monitor your lab values.  Total Time spent coordinating discharge including counseling, education and face to face time equals 35 minutes.  Signed: Hezakiah Champeau 04/16/2020 10:04 AM

## 2020-04-16 NOTE — TOC Transition Note (Addendum)
Transition of Care [facility]) - CM/SW Discharge Note   Patient Details  Name: MARGEL JOENS MRN: [id number] Date of Birth: 08-Mar-1946  Transition of Care [facility]) CM/SW Contact:  Zenon Mayo, RN Phone Number: 04/16/2020, 11:10 AM   Clinical Narrative:    Patient is for dc home today, NCM contacted patient in the room , asked if she has chosen an [facility] agency for [facility], HHPT, she states she would like [facility] , NCM made referral to [facility], awaiting call back.  NCM also contacted [facility] about the home oxygen, she states they will bring it up to the room shortly.   10/30-NCM received call from [facility] stating she needs updated saturations and updated order.  NCM informed charge RN ad Diplomatic Services operational officer.    Final next level of care: [place] Barriers to Discharge: Equipment Delay   Patient Goals and CMS Choice Patient states their goals for this hospitalization and ongoing recovery are:: to return home CMS Medicare.gov Compare Post Acute Care list provided to:: Patient Choice offered to / list presented to : Patient  Discharge Placement                       Discharge Plan and Services In-house Referral: NA Discharge Planning Services: CM Consult Post Acute Care Choice: Durable Medical Equipment, Home Health          DME Arranged: Oxygen DME Agency: [employer] Date DME Agency Contacted: 04/16/20 Time DME Agency Contacted: 1109 Representative spoke with at DME Agency: [facility] HH Arranged: PT, RN [place] Agency: [place] ([place]) Date [place]: 04/16/20 Time [place]: 1110 Representative spoke with at [place]: [place] ([place]) Interventions     Readmission Risk Interventions Readmission Risk Prevention Plan 04/15/2020  Transportation Screening Complete  PCP or Specialist Appt within 3-5 Days Complete  HRI or [place] Complete  Social Work Consult for [place]  Planning/Counseling Complete  Palliative Care Screening Not Applicable  Medication Review Press photographer) Complete  Some recent data might be hidden

## 2020-04-18 ENCOUNTER — Ambulatory Visit: Payer: Medicare Other

## 2020-04-18 DIAGNOSIS — U071 COVID-19: Secondary | ICD-10-CM

## 2020-04-18 DIAGNOSIS — I129 Hypertensive chronic kidney disease with stage 1 through stage 4 chronic kidney disease, or unspecified chronic kidney disease: Secondary | ICD-10-CM

## 2020-04-18 DIAGNOSIS — N1831 Chronic kidney disease, stage 3a: Secondary | ICD-10-CM

## 2020-04-18 MED FILL — Verapamil HCl IV Soln 2.5 MG/ML: INTRAVENOUS | Qty: 2 | Status: AC

## 2020-04-18 NOTE — Chronic Care Management (AMB) (Signed)
Chronic Care Management    Social Work Follow Up Note  04/18/2020 Name: Amanda Ross MRN: [id number] DOB: December 28, 1945  Amanda Ross is a 74 y.o. year old female who is a primary care patient of [name], MD. The CCM team was consulted for assistance with care coordination.   Review of patient status, including review of consultants reports, other relevant assessments, and collaboration with appropriate care team members and the patient's provider was performed as part of comprehensive patient evaluation and provision of chronic care management services.    SDOH (Social Determinants of Health) assessments performed: No    Outpatient Encounter Medications as of 04/18/2020  Medication Sig  . albuterol (VENTOLIN HFA) 108 (90 Base) MCG/ACT inhaler Inhale 1-2 puffs into the lungs every 6 (six) hours as needed for wheezing or shortness of breath.  Marland Kitchen aspirin 81 MG tablet Take 81 mg by mouth daily.  Marland Kitchen atorvastatin (LIPITOR) 10 MG tablet Take 1 tablet (10 mg total) by mouth daily.  . baclofen (LIORESAL) 10 MG tablet TAKE 1/2 TABLET BY MOUTH TWICE DAILY AS NEEDED (Patient taking differently: Take 10-15 mg by mouth 2 (two) times daily as needed for muscle spasms. )  . Cholecalciferol (VITAMIN D) 1000 UNITS capsule Take 2,000 Units by mouth daily.   . diclofenac Sodium (VOLTAREN) 1 % GEL Apply 2 g to 4 g to affected area up to 4 times daily as needed. (Patient taking differently: Apply 2 g topically 4 (four) times daily as needed (pain). )  . hydroxychloroquine (PLAQUENIL) 200 MG tablet Take 1 tablet (200 mg total) by mouth every morning.  Marland Kitchen ipratropium (ATROVENT) 0.03 % nasal spray Place 2 sprays into both nostrils as needed. (Patient not taking: Reported on 03/02/2020)  . magnesium gluconate (MAGONATE) 500 MG tablet Take 500 mg by mouth daily.   . metoprolol tartrate (LOPRESSOR) 25 MG tablet TAKE 1/2 TABLET BY MOUTH TWICE DAILY  . Nutritional Supplements (,FEEDING SUPPLEMENT, PROSOURCE  PLUS) liquid Take 30 mLs by mouth 2 (two) times daily between meals.  Marland Kitchen omeprazole (PRILOSEC) 20 MG capsule TAKE 2 CAPSULES(40 MG) BY MOUTH DAILY (Patient taking differently: Take 40 mg by mouth daily. )  . oxybutynin (DITROPAN-XL) 10 MG 24 hr tablet Take 10 mg by mouth at bedtime.  . Potassium 99 MG TABS Take 1 tablet by mouth daily.  . traZODone (DESYREL) 50 MG tablet TAKE 1 TABLET BY MOUTH EVERY NIGHT AT BEDTIME   No facility-administered encounter medications on file as of 04/18/2020.     Goals Addressed            This Visit's Progress   . Collaborate with RN Care Manager to perform appropriate assessments to assist with care coordination needs       CARE PLAN ENTRY (see longitudinal plan of care for additional care plan information)  Current Barriers:  . ADL IADL limitations . Recent 23 days admission from 10/7 - 10/23 Dx of acute hypoxemic respiratory failure due to COVID 19 infection . Chronic conditions including hypertensive nephropathy, CKD III, and memory changes which put patient at increased risk for hospitalization  Social Work Clinical Goal(s):  Marland Kitchen Over the next 90 days the patient will work with SW to address care coordination needs  Interventions: . Inter-disciplinary care team collaboration (see longitudinal plan of care) . Successful outbound call placed to the patient to assist with care coordination needs post discharge . Confirmed patient use of in home oxygen o Patient reports her O2 level continues to drop  with ambulation o Patient husband home with her to assist with ADL and iADL needs . Determined patient has yet to engage with [place] but reports she plans to call today to schedule start of care o Advised the patient if she has barriers to scheduling to contact the office for assistance . Discussed patient concern surrounding financial resources needed o Provided the patient with information on food pantry's and LIEAP program via mail . Patient  repots she is scheduled for hospital follow up to see PCP Wednesday at 4:00 pm . Collaboration with RN Care Manager regarding patient updates . Scheduled follow up over the next 4 weeks  Patient Self Care Activities:  . Patient verbalizes understanding of plan to contact SW as needed prior to next scheduled call . Self administers medications as prescribed . Attends all scheduled provider appointments . Performs ADL's independently . Calls provider office for new concerns or questions  Initial goal documentation         Follow Up Plan: SW will follow up with patient by phone over the next four weeks.   Daneen Schick, BSW, CDP Social Worker, Certified Dementia Practitioner [place] [facility] [place] [facility] [phone]  Total time spent performing care coordination and/or care management activities with the patient by phone or face to face = 19 minutes.

## 2020-04-18 NOTE — Patient Instructions (Signed)
Social Worker Visit Information  Goals we discussed today:  Goals Addressed            This Visit's Progress   . Collaborate with RN Care Manager to perform appropriate assessments to assist with care coordination needs       CARE PLAN ENTRY (see longitudinal plan of care for additional care plan information)  Current Barriers:  . ADL IADL limitations . Recent 23 days admission from 10/7 - 10/23 Dx of acute hypoxemic respiratory failure due to COVID 19 infection . Chronic conditions including hypertensive nephropathy, CKD III, and memory changes which put patient at increased risk for hospitalization  Social Work Clinical Goal(s):  Marland Kitchen Over the next 90 days the patient will work with SW to address care coordination needs  Interventions: . Inter-disciplinary care team collaboration (see longitudinal plan of care) . Successful outbound call placed to the patient to assist with care coordination needs post discharge . Confirmed patient use of in home oxygen o Patient reports her O2 level continues to drop with ambulation o Patient husband home with her to assist with ADL and iADL needs . Determined patient has yet to engage with [place] but reports she plans to call today to schedule start of care o Advised the patient if she has barriers to scheduling to contact the office for assistance . Discussed patient concern surrounding financial resources needed o Provided the patient with information on food pantry's and LIEAP program via mail . Patient repots she is scheduled for hospital follow up to see PCP Wednesday at 4:00 pm . Collaboration with RN Care Manager regarding patient updates . Scheduled follow up over the next 4 weeks  Patient Self Care Activities:  . Patient verbalizes understanding of plan to contact SW as needed prior to next scheduled call . Self administers medications as prescribed . Attends all scheduled provider appointments . Performs ADL's  independently . Calls provider office for new concerns or questions  Initial goal documentation         Materials Provided: Yes: provided resource information via mail  Follow Up Plan: SW will follow up with patient by phone over the next four weeks   Daneen Schick, BSW, CDP Social Worker, Certified Dementia Practitioner [place] [facility] [place] [facility] [phone]

## 2020-04-19 ENCOUNTER — Telehealth: Payer: Self-pay

## 2020-04-19 DIAGNOSIS — N3281 Overactive bladder: Secondary | ICD-10-CM | POA: Diagnosis not present

## 2020-04-19 DIAGNOSIS — Z6837 Body mass index (BMI) 37.0-37.9, adult: Secondary | ICD-10-CM | POA: Diagnosis not present

## 2020-04-19 DIAGNOSIS — J45909 Unspecified asthma, uncomplicated: Secondary | ICD-10-CM | POA: Diagnosis not present

## 2020-04-19 DIAGNOSIS — I129 Hypertensive chronic kidney disease with stage 1 through stage 4 chronic kidney disease, or unspecified chronic kidney disease: Secondary | ICD-10-CM | POA: Diagnosis not present

## 2020-04-19 DIAGNOSIS — G4733 Obstructive sleep apnea (adult) (pediatric): Secondary | ICD-10-CM | POA: Diagnosis not present

## 2020-04-19 DIAGNOSIS — E039 Hypothyroidism, unspecified: Secondary | ICD-10-CM | POA: Diagnosis not present

## 2020-04-19 DIAGNOSIS — Z9981 Dependence on supplemental oxygen: Secondary | ICD-10-CM | POA: Diagnosis not present

## 2020-04-19 DIAGNOSIS — Z9181 History of falling: Secondary | ICD-10-CM | POA: Diagnosis not present

## 2020-04-19 DIAGNOSIS — J1282 Pneumonia due to coronavirus disease 2019: Secondary | ICD-10-CM | POA: Diagnosis not present

## 2020-04-19 DIAGNOSIS — N179 Acute kidney failure, unspecified: Secondary | ICD-10-CM | POA: Diagnosis not present

## 2020-04-19 DIAGNOSIS — N1832 Chronic kidney disease, stage 3b: Secondary | ICD-10-CM | POA: Diagnosis not present

## 2020-04-19 DIAGNOSIS — Z7982 Long term (current) use of aspirin: Secondary | ICD-10-CM | POA: Diagnosis not present

## 2020-04-19 DIAGNOSIS — E669 Obesity, unspecified: Secondary | ICD-10-CM | POA: Diagnosis not present

## 2020-04-19 DIAGNOSIS — U071 COVID-19: Secondary | ICD-10-CM | POA: Diagnosis not present

## 2020-04-19 DIAGNOSIS — E041 Nontoxic single thyroid nodule: Secondary | ICD-10-CM | POA: Diagnosis not present

## 2020-04-19 DIAGNOSIS — I21A1 Myocardial infarction type 2: Secondary | ICD-10-CM | POA: Diagnosis not present

## 2020-04-19 DIAGNOSIS — D696 Thrombocytopenia, unspecified: Secondary | ICD-10-CM | POA: Diagnosis not present

## 2020-04-19 DIAGNOSIS — J9621 Acute and chronic respiratory failure with hypoxia: Secondary | ICD-10-CM | POA: Diagnosis not present

## 2020-04-19 NOTE — Telephone Encounter (Signed)
Transition Care Management Follow-up Telephone Call  Date of discharge and from where: 04/16/2020 [place]  How have you been since you were released from the hospital? She has been ok   Any questions or concerns? no  Items Reviewed:  Did the pt receive and understand the discharge instructions provided? Yes   Medications obtained and verified? Yes   Any new allergies since your discharge? no  Dietary orders reviewed? Yes  Do you have support at home? Yes   Other (ie: DME, Home Health, etc) home health   Functional Questionnaire: (I = Independent and D = Dependent)  Bathing/Dressing- i   Meal Prep- d  Eating- i  Maintaining continence- i  Transferring/Ambulation- i  Managing Meds- i   Follow up appointments reviewed:    PCP Hospital f/u appt confirmed? Yes  scheduled to see moore on 04/25/20 @ 1130a.  [facility] f/u appt confirmed? Cardiologist but doesn't know the day   Are transportation arrangements needed? No   If their condition worsens, is the pt aware to call  their PCP or go to the ED? Yes  Was the patient provided with contact information for the PCP's office or ED? Yes  Was the pt encouraged to call back with questions or concerns? Yes

## 2020-04-21 ENCOUNTER — Telehealth: Payer: Self-pay

## 2020-04-21 DIAGNOSIS — I21A1 Myocardial infarction type 2: Secondary | ICD-10-CM | POA: Diagnosis not present

## 2020-04-21 DIAGNOSIS — U071 COVID-19: Secondary | ICD-10-CM | POA: Diagnosis not present

## 2020-04-21 DIAGNOSIS — J1282 Pneumonia due to coronavirus disease 2019: Secondary | ICD-10-CM | POA: Diagnosis not present

## 2020-04-21 DIAGNOSIS — I129 Hypertensive chronic kidney disease with stage 1 through stage 4 chronic kidney disease, or unspecified chronic kidney disease: Secondary | ICD-10-CM | POA: Diagnosis not present

## 2020-04-21 DIAGNOSIS — J9621 Acute and chronic respiratory failure with hypoxia: Secondary | ICD-10-CM | POA: Diagnosis not present

## 2020-04-21 DIAGNOSIS — J45909 Unspecified asthma, uncomplicated: Secondary | ICD-10-CM | POA: Diagnosis not present

## 2020-04-21 NOTE — Telephone Encounter (Signed)
The was notified that Laurance Flatten, NP said that the pt needed to go to the ER because the pt's oxygen was down in the 60's when the nurse with [facility] Doroteo Bradford was at the pts home and that it was because the pt was up and moving around and her O2 drops down when the pt is up and moving the O2 was turned up to 3 liters and the pt's oxygen level went up to 88, the nurse advised the pt and her husband Mr Reinheimer to have the oxygen set at 4 when the pt is moving around.  The pt declined to go to the ER and said that Mr. Weedon is the one being hard headed about turing the oxygen up.  The pt was told to make sure her settings were at the recommended settings.  Doroteo Bradford was also notified and she said that she would go and see the pt this Saturday to check on her.  She was given an ok for the requested verbal orders for nursing.

## 2020-04-23 DIAGNOSIS — U071 COVID-19: Secondary | ICD-10-CM | POA: Diagnosis not present

## 2020-04-23 DIAGNOSIS — J9621 Acute and chronic respiratory failure with hypoxia: Secondary | ICD-10-CM | POA: Diagnosis not present

## 2020-04-23 DIAGNOSIS — J45909 Unspecified asthma, uncomplicated: Secondary | ICD-10-CM | POA: Diagnosis not present

## 2020-04-23 DIAGNOSIS — I21A1 Myocardial infarction type 2: Secondary | ICD-10-CM | POA: Diagnosis not present

## 2020-04-23 DIAGNOSIS — J1282 Pneumonia due to coronavirus disease 2019: Secondary | ICD-10-CM | POA: Diagnosis not present

## 2020-04-23 DIAGNOSIS — I129 Hypertensive chronic kidney disease with stage 1 through stage 4 chronic kidney disease, or unspecified chronic kidney disease: Secondary | ICD-10-CM | POA: Diagnosis not present

## 2020-04-25 ENCOUNTER — Other Ambulatory Visit: Payer: Self-pay

## 2020-04-25 ENCOUNTER — Encounter: Payer: Medicare Other | Admitting: Nurse Practitioner

## 2020-04-25 NOTE — Progress Notes (Signed)
Not seen

## 2020-04-26 ENCOUNTER — Other Ambulatory Visit: Payer: Self-pay

## 2020-04-26 ENCOUNTER — Telehealth (INDEPENDENT_AMBULATORY_CARE_PROVIDER_SITE_OTHER): Payer: Medicare Other | Admitting: Nurse Practitioner

## 2020-04-26 ENCOUNTER — Encounter: Payer: Self-pay | Admitting: Nurse Practitioner

## 2020-04-26 VITALS — BP 122/80 | Wt 211.9 lb

## 2020-04-26 DIAGNOSIS — J9601 Acute respiratory failure with hypoxia: Secondary | ICD-10-CM | POA: Diagnosis not present

## 2020-04-26 DIAGNOSIS — I214 Non-ST elevation (NSTEMI) myocardial infarction: Secondary | ICD-10-CM

## 2020-04-26 DIAGNOSIS — U071 COVID-19: Secondary | ICD-10-CM | POA: Diagnosis not present

## 2020-04-26 NOTE — Progress Notes (Signed)
Virtual Visit via telephone   This visit type was conducted due to national recommendations for restrictions regarding the COVID-19 Pandemic (e.g. social distancing) in an effort to limit this patient's exposure and mitigate transmission in our community.  Due to her co-morbid illnesses, this patient is at least at moderate risk for complications without adequate follow up.  This format is felt to be most appropriate for this patient at this time.  All issues noted in this document were discussed and addressed.  A limited physical exam was performed with this format.    This visit type was conducted due to national recommendations for restrictions regarding the COVID-19 Pandemic (e.g. social distancing) in an effort to limit this patient's exposure and mitigate transmission in our community.  Patients identity confirmed using two different identifiers.  This format is felt to be most appropriate for this patient at this time.  All issues noted in this document were discussed and addressed.  No physical exam was performed (except for noted visual exam findings with Video Visits).    Date:  06/17/2020   ID:  Amanda Ross, DOB February 19, 1946, MRN [id number]  Patient Location:  Home - spoke with Amanda Ross  Provider location:   Office    Chief Complaint:    History of Present Illness:    Amanda Ross is a 74 y.o. female who presents via video conferencing for a telehealth visit today.    The patient does not have symptoms concerning for COVID-19 infection (fever, chills, cough, or new shortness of breath).   Telephone visit hospital admission   Acute Hypoxic Resp Failure due to Covid 19 Viral pneumonia:  Significantly better-requiring only around 2 L of oxygen at rest-requires around 4 L with ambulation.  Has completed more than 21 days of isolation-does not require any further isolation or treatment for COVID-19 pneumonia.  Was treated with a course of steroids/remdesivir and 1  dose of Actemra.     When she moves around she will increase her oxygen to 4-5 liters per minute.  She is currently using 4 l/m. She had an episode a couple days ago with shortness of breath and was recommended to go to ER for evaluation but she did not want to go.  She has a sore area on her back and she is using A&D ointment.  Reports it as open, at the crease area.  This has occurred since being home.  Her toilet seat may have caused this area.  Once when she stood up there was pink blood.    She reports a home health nurse visits regularly. She was brought an emergency oxygen tank.  She is getting anxious when having to do activities and was recommended to not take a shower due to this reason. She is getting nervous when she has to move around.  She does feel like she would be benefit her. She is also concerned about using the oxygen concentrator due to concerns about her electricity bill. She has had a delivery for her oxygen, remote health is going to check on a portable oxygen tank.    Overall she feels she is doing okay with what she has been through    Past Medical History:  Diagnosis Date  . Asthma   . GERD (gastroesophageal reflux disease)   . H/O bladder infections   . H/O measles   . H/O varicella   . Headache(784.0)    Frequently  . Hypertension   . OSA (obstructive sleep apnea)   .  Osteoarthritis   . Yeast infection    Past Surgical History:  Procedure Laterality Date  . BUNIONECTOMY    . CARPAL TUNNEL RELEASE    . dental work  08/2019  . LEFT HEART CATH AND CORONARY ANGIOGRAPHY N/A 04/15/2020   Procedure: LEFT HEART CATH AND CORONARY ANGIOGRAPHY;  Surgeon: Dixie Dials, MD;  Location: [facility];  Service: Cardiovascular;  Laterality: N/A;  . TOTAL SHOULDER ARTHROPLASTY       Current Meds  Medication Sig  . albuterol (VENTOLIN HFA) 108 (90 Base) MCG/ACT inhaler Inhale 1-2 puffs into the lungs every 6 (six) hours as needed for wheezing or shortness of  breath.  Marland Kitchen aspirin 81 MG tablet Take 81 mg by mouth daily.  Marland Kitchen atorvastatin (LIPITOR) 10 MG tablet Take 1 tablet (10 mg total) by mouth daily.  . baclofen (LIORESAL) 10 MG tablet TAKE 1/2 TABLET BY MOUTH TWICE DAILY AS NEEDED (Patient taking differently: Take 5 mg by mouth in the morning and at bedtime. )  . diclofenac Sodium (VOLTAREN) 1 % GEL Apply 2 g to 4 g to affected area up to 4 times daily as needed. (Patient taking differently: Apply 2 g topically 4 (four) times daily as needed (to painful sites). )  . hydroxychloroquine (PLAQUENIL) 200 MG tablet Take 1 tablet (200 mg total) by mouth every morning.  Marland Kitchen ipratropium (ATROVENT) 0.03 % nasal spray Place 2 sprays into both nostrils as needed. (Patient taking differently: Place 2 sprays into both nostrils daily as needed for rhinitis. )  . magnesium gluconate (MAGONATE) 500 MG tablet Take 500 mg by mouth daily.   . metoprolol tartrate (LOPRESSOR) 25 MG tablet TAKE 1/2 TABLET BY MOUTH TWICE DAILY (Patient taking differently: Take 12.5 mg by mouth 2 (two) times daily. )  . [EXPIRED] Nutritional Supplements (,FEEDING SUPPLEMENT, PROSOURCE PLUS) liquid Take 30 mLs by mouth 2 (two) times daily between meals. (Patient not taking: Reported on 04/29/2020)  . omeprazole (PRILOSEC) 20 MG capsule TAKE 2 CAPSULES(40 MG) BY MOUTH DAILY (Patient taking differently: Take 40 mg by mouth daily. )  . oxybutynin (DITROPAN-XL) 10 MG 24 hr tablet Take 10 mg by mouth at bedtime.  . Potassium 99 MG TABS Take 99 mg by mouth daily.   . traZODone (DESYREL) 50 MG tablet TAKE 1 TABLET BY MOUTH EVERY NIGHT AT BEDTIME (Patient taking differently: Take 50 mg by mouth at bedtime. )  . [DISCONTINUED] Cholecalciferol (VITAMIN D) 1000 UNITS capsule Take 2,000 Units by mouth daily.      Allergies:   Ivp dye [iodinated diagnostic agents], Percocet [oxycodone-acetaminophen], Iohexol, Penicillins, and Prednisone   Social History   Tobacco Use  . Smoking status: Former Smoker     Packs/day: 1.00    Years: 40.00    Pack years: 40.00    Types: Cigarettes    Quit date: 06/18/2005    Years since quitting: 15.0  . Smokeless tobacco: Never Used  Vaping Use  . Vaping Use: Never used  Substance Use Topics  . Alcohol use: Not Currently    Comment: rare  . Drug use: No     Family Hx: The patient's family history includes Asthma in her daughter; Deep vein thrombosis in her mother; Heart disease in her brother and mother; Rheum arthritis in her mother and sister.  ROS:   Please see the history of present illness.    Review of Systems  Constitutional: Negative.   Respiratory: Positive for shortness of breath (with exertion). Negative for wheezing.  Cardiovascular: Negative.   Genitourinary: Negative.   Musculoskeletal:       Generalized weakness  Neurological: Negative for dizziness and headaches.  Psychiatric/Behavioral: Negative.     All other systems reviewed and are negative.   Labs/Other Tests and Data Reviewed:    Recent Labs: 05/03/2020: ALT 88; B Natriuretic Peptide 405.6; BUN 50; Creatinine, Ser 1.34; Hemoglobin 12.8; Magnesium 2.1; Platelets 369; Potassium 4.5; Sodium 143   Recent Lipid Panel Lab Results  Component Value Date/Time   CHOL 161 03/02/2020 12:04 PM   TRIG 82 03/24/2020 07:36 PM   HDL 54 03/02/2020 12:04 PM   CHOLHDL 3.0 03/02/2020 12:04 PM   LDLCALC 84 03/02/2020 12:04 PM    Wt Readings from Last 3 Encounters:  05/09/2020 211 lb 13.8 oz (96.1 kg)  04/26/20 211 lb 13.8 oz (96.1 kg)  04/11/20 211 lb 13.8 oz (96.1 kg)     Exam:    Vital Signs:  BP 122/80   Wt 211 lb 13.8 oz (96.1 kg)   BMI 37.53 kg/m     Physical Exam Constitutional:      General: She is not in acute distress.    Appearance: Normal appearance. She is obese.  Pulmonary:     Effort: No respiratory distress ( able to speak in complete sentences).  Neurological:     Mental Status: She is alert.  Psychiatric:        Mood and Affect: Mood normal.         Behavior: Behavior normal.        Thought Content: Thought content normal.        Judgment: Judgment normal.     ASSESSMENT & PLAN:    1. Acute hypoxemic respiratory failure due to COVID-19 [facility])  This is why she was in the hospital, she does have shortness of breath with exertion and is on oxygen which she was able to get through remote health as she did not want to go back to the hospital at that time. During telephone visit she was able to speak in complete sentences TCM Performed. A member of the clinical team spoke with the patient upon dischare. Discharge summary was reviewed in full detail during the visit. Meds reconciled and compared to discharge meds. Medication list is updated and reviewed with the patient.  Greater than 50% face to face time was spent in counseling an coordination of care.  All questions were answered to the satisfaction of the patient.   She is encouraged if breathing worsens she needs to go to ER  Will be ordering follow up CXR in 4-6 weeks She was treated with a short course of steroids/remdesivir and 1 dose of actemra  2. Non-STEMI (non-ST elevated myocardial infarction) [facility])  She is to follow cardiology, prior to discharge no new cardiac issues   COVID-19 Education: The signs and symptoms of COVID-19 were discussed with the patient and how to seek care for testing (follow up with PCP or arrange E-visit).  The importance of social distancing was discussed today.  Patient Risk:   After full review of this patients clinical status, I feel that they are at least moderate risk at this time.  Time:   Today, I have spent 19.25 minutes/ seconds with the patient with telehealth technology discussing above diagnoses.     Medication Adjustments/Labs and Tests Ordered: Current medicines are reviewed at length with the patient today.  Concerns regarding medicines are outlined above.   Tests Ordered: No orders of the defined types  were placed in this  encounter.   Medication Changes: No orders of the defined types were placed in this encounter.   Disposition:  Follow up in 6 week(s)  Signed, Minette Brine, FNP

## 2020-04-27 DIAGNOSIS — I129 Hypertensive chronic kidney disease with stage 1 through stage 4 chronic kidney disease, or unspecified chronic kidney disease: Secondary | ICD-10-CM | POA: Diagnosis not present

## 2020-04-27 DIAGNOSIS — U071 COVID-19: Secondary | ICD-10-CM | POA: Diagnosis not present

## 2020-04-27 DIAGNOSIS — I21A1 Myocardial infarction type 2: Secondary | ICD-10-CM | POA: Diagnosis not present

## 2020-04-27 DIAGNOSIS — J45909 Unspecified asthma, uncomplicated: Secondary | ICD-10-CM | POA: Diagnosis not present

## 2020-04-27 DIAGNOSIS — J1282 Pneumonia due to coronavirus disease 2019: Secondary | ICD-10-CM | POA: Diagnosis not present

## 2020-04-27 DIAGNOSIS — J9621 Acute and chronic respiratory failure with hypoxia: Secondary | ICD-10-CM | POA: Diagnosis not present

## 2020-04-28 ENCOUNTER — Emergency Department (HOSPITAL_COMMUNITY): Payer: Medicare Other

## 2020-04-28 ENCOUNTER — Inpatient Hospital Stay (HOSPITAL_COMMUNITY)
Admission: EM | Admit: 2020-04-28 | Discharge: 2020-05-18 | DRG: 196 | Disposition: E | Payer: Medicare Other | Attending: Internal Medicine | Admitting: Internal Medicine

## 2020-04-28 DIAGNOSIS — R0602 Shortness of breath: Secondary | ICD-10-CM | POA: Diagnosis not present

## 2020-04-28 DIAGNOSIS — J45909 Unspecified asthma, uncomplicated: Secondary | ICD-10-CM | POA: Diagnosis present

## 2020-04-28 DIAGNOSIS — J811 Chronic pulmonary edema: Secondary | ICD-10-CM | POA: Diagnosis not present

## 2020-04-28 DIAGNOSIS — I248 Other forms of acute ischemic heart disease: Secondary | ICD-10-CM | POA: Diagnosis present

## 2020-04-28 DIAGNOSIS — I82442 Acute embolism and thrombosis of left tibial vein: Secondary | ICD-10-CM | POA: Diagnosis present

## 2020-04-28 DIAGNOSIS — Z8249 Family history of ischemic heart disease and other diseases of the circulatory system: Secondary | ICD-10-CM | POA: Diagnosis not present

## 2020-04-28 DIAGNOSIS — Z8616 Personal history of COVID-19: Secondary | ICD-10-CM | POA: Diagnosis not present

## 2020-04-28 DIAGNOSIS — N179 Acute kidney failure, unspecified: Secondary | ICD-10-CM | POA: Diagnosis present

## 2020-04-28 DIAGNOSIS — I2609 Other pulmonary embolism with acute cor pulmonale: Secondary | ICD-10-CM | POA: Diagnosis not present

## 2020-04-28 DIAGNOSIS — K219 Gastro-esophageal reflux disease without esophagitis: Secondary | ICD-10-CM | POA: Diagnosis present

## 2020-04-28 DIAGNOSIS — R918 Other nonspecific abnormal finding of lung field: Secondary | ICD-10-CM | POA: Diagnosis not present

## 2020-04-28 DIAGNOSIS — R0902 Hypoxemia: Secondary | ICD-10-CM | POA: Diagnosis not present

## 2020-04-28 DIAGNOSIS — Z6837 Body mass index (BMI) 37.0-37.9, adult: Secondary | ICD-10-CM

## 2020-04-28 DIAGNOSIS — J8410 Pulmonary fibrosis, unspecified: Principal | ICD-10-CM | POA: Diagnosis present

## 2020-04-28 DIAGNOSIS — Z87891 Personal history of nicotine dependence: Secondary | ICD-10-CM

## 2020-04-28 DIAGNOSIS — I110 Hypertensive heart disease with heart failure: Secondary | ICD-10-CM | POA: Diagnosis present

## 2020-04-28 DIAGNOSIS — Z96619 Presence of unspecified artificial shoulder joint: Secondary | ICD-10-CM | POA: Diagnosis present

## 2020-04-28 DIAGNOSIS — Z515 Encounter for palliative care: Secondary | ICD-10-CM

## 2020-04-28 DIAGNOSIS — G4733 Obstructive sleep apnea (adult) (pediatric): Secondary | ICD-10-CM | POA: Diagnosis present

## 2020-04-28 DIAGNOSIS — U099 Post covid-19 condition, unspecified: Secondary | ICD-10-CM | POA: Diagnosis present

## 2020-04-28 DIAGNOSIS — Z66 Do not resuscitate: Secondary | ICD-10-CM | POA: Diagnosis present

## 2020-04-28 DIAGNOSIS — I82402 Acute embolism and thrombosis of unspecified deep veins of left lower extremity: Secondary | ICD-10-CM | POA: Diagnosis not present

## 2020-04-28 DIAGNOSIS — U071 COVID-19: Secondary | ICD-10-CM

## 2020-04-28 DIAGNOSIS — I272 Pulmonary hypertension, unspecified: Secondary | ICD-10-CM | POA: Diagnosis present

## 2020-04-28 DIAGNOSIS — Z91041 Radiographic dye allergy status: Secondary | ICD-10-CM | POA: Diagnosis not present

## 2020-04-28 DIAGNOSIS — M3500 Sicca syndrome, unspecified: Secondary | ICD-10-CM | POA: Diagnosis present

## 2020-04-28 DIAGNOSIS — Z7189 Other specified counseling: Secondary | ICD-10-CM | POA: Diagnosis not present

## 2020-04-28 DIAGNOSIS — Z8261 Family history of arthritis: Secondary | ICD-10-CM | POA: Diagnosis not present

## 2020-04-28 DIAGNOSIS — Z8701 Personal history of pneumonia (recurrent): Secondary | ICD-10-CM | POA: Diagnosis not present

## 2020-04-28 DIAGNOSIS — I129 Hypertensive chronic kidney disease with stage 1 through stage 4 chronic kidney disease, or unspecified chronic kidney disease: Secondary | ICD-10-CM | POA: Diagnosis not present

## 2020-04-28 DIAGNOSIS — L8990 Pressure ulcer of unspecified site, unspecified stage: Secondary | ICD-10-CM | POA: Insufficient documentation

## 2020-04-28 DIAGNOSIS — R7989 Other specified abnormal findings of blood chemistry: Secondary | ICD-10-CM | POA: Diagnosis not present

## 2020-04-28 DIAGNOSIS — I509 Heart failure, unspecified: Secondary | ICD-10-CM | POA: Diagnosis not present

## 2020-04-28 DIAGNOSIS — I5033 Acute on chronic diastolic (congestive) heart failure: Secondary | ICD-10-CM | POA: Diagnosis present

## 2020-04-28 DIAGNOSIS — J1282 Pneumonia due to coronavirus disease 2019: Secondary | ICD-10-CM | POA: Diagnosis not present

## 2020-04-28 DIAGNOSIS — I5031 Acute diastolic (congestive) heart failure: Secondary | ICD-10-CM | POA: Diagnosis not present

## 2020-04-28 DIAGNOSIS — I2699 Other pulmonary embolism without acute cor pulmonale: Secondary | ICD-10-CM

## 2020-04-28 DIAGNOSIS — R000 Tachycardia, unspecified: Secondary | ICD-10-CM | POA: Diagnosis not present

## 2020-04-28 DIAGNOSIS — J9601 Acute respiratory failure with hypoxia: Secondary | ICD-10-CM

## 2020-04-28 DIAGNOSIS — J90 Pleural effusion, not elsewhere classified: Secondary | ICD-10-CM | POA: Diagnosis not present

## 2020-04-28 DIAGNOSIS — Z825 Family history of asthma and other chronic lower respiratory diseases: Secondary | ICD-10-CM

## 2020-04-28 DIAGNOSIS — M7989 Other specified soft tissue disorders: Secondary | ICD-10-CM | POA: Diagnosis not present

## 2020-04-28 DIAGNOSIS — E785 Hyperlipidemia, unspecified: Secondary | ICD-10-CM | POA: Diagnosis present

## 2020-04-28 DIAGNOSIS — I21A1 Myocardial infarction type 2: Secondary | ICD-10-CM | POA: Diagnosis not present

## 2020-04-28 DIAGNOSIS — J9621 Acute and chronic respiratory failure with hypoxia: Secondary | ICD-10-CM | POA: Diagnosis not present

## 2020-04-28 LAB — COMPREHENSIVE METABOLIC PANEL
ALT: 24 U/L (ref 0–44)
AST: 19 U/L (ref 15–41)
Albumin: 3 g/dL — ABNORMAL LOW (ref 3.5–5.0)
Alkaline Phosphatase: 86 U/L (ref 38–126)
Anion gap: 9 (ref 5–15)
BUN: 16 mg/dL (ref 8–23)
CO2: 21 mmol/L — ABNORMAL LOW (ref 22–32)
Calcium: 10.8 mg/dL — ABNORMAL HIGH (ref 8.9–10.3)
Chloride: 111 mmol/L (ref 98–111)
Creatinine, Ser: 1.03 mg/dL — ABNORMAL HIGH (ref 0.44–1.00)
GFR, Estimated: 57 mL/min — ABNORMAL LOW (ref >60)
Glucose, Bld: 116 mg/dL — ABNORMAL HIGH (ref 70–99)
Potassium: 4.3 mmol/L (ref 3.5–5.1)
Sodium: 141 mmol/L (ref 135–145)
Total Bilirubin: 1 mg/dL (ref 0.3–1.2)
Total Protein: 6 g/dL — ABNORMAL LOW (ref 6.5–8.1)

## 2020-04-28 LAB — LACTIC ACID, PLASMA
Lactic Acid, Venous: 1.3 mmol/L (ref 0.5–1.9)
Lactic Acid, Venous: 1.8 mmol/L (ref 0.5–1.9)

## 2020-04-28 LAB — CBC WITH DIFFERENTIAL/PLATELET
Abs Immature Granulocytes: 0.07 10*3/uL (ref 0.00–0.07)
Basophils Absolute: 0.1 10*3/uL (ref 0.0–0.1)
Basophils Relative: 1 %
Eosinophils Absolute: 0.2 10*3/uL (ref 0.0–0.5)
Eosinophils Relative: 2 %
HCT: 37.9 % (ref 36.0–46.0)
Hemoglobin: 12.3 g/dL (ref 12.0–15.0)
Immature Granulocytes: 1 %
Lymphocytes Relative: 10 %
Lymphs Abs: 0.9 10*3/uL (ref 0.7–4.0)
MCH: 31.7 pg (ref 26.0–34.0)
MCHC: 32.5 g/dL (ref 30.0–36.0)
MCV: 97.7 fL (ref 80.0–100.0)
Monocytes Absolute: 1 10*3/uL (ref 0.1–1.0)
Monocytes Relative: 11 %
Neutro Abs: 6.9 10*3/uL (ref 1.7–7.7)
Neutrophils Relative %: 75 %
Platelets: 155 10*3/uL (ref 150–400)
RBC: 3.88 MIL/uL (ref 3.87–5.11)
RDW: 19.9 % — ABNORMAL HIGH (ref 11.5–15.5)
WBC: 9.1 10*3/uL (ref 4.0–10.5)
nRBC: 0 % (ref 0.0–0.2)

## 2020-04-28 LAB — PROCALCITONIN: Procalcitonin: 0.13 ng/mL

## 2020-04-28 LAB — BRAIN NATRIURETIC PEPTIDE: B Natriuretic Peptide: 592.1 pg/mL — ABNORMAL HIGH (ref 0.0–100.0)

## 2020-04-28 LAB — PROTIME-INR
INR: 1.2 (ref 0.8–1.2)
Prothrombin Time: 14.3 seconds (ref 11.4–15.2)

## 2020-04-28 LAB — TROPONIN I (HIGH SENSITIVITY)
Troponin I (High Sensitivity): 87 ng/L — ABNORMAL HIGH (ref <18)
Troponin I (High Sensitivity): 103 ng/L (ref <18)

## 2020-04-28 LAB — C-REACTIVE PROTEIN: CRP: 10.9 mg/dL — ABNORMAL HIGH (ref <1.0)

## 2020-04-28 LAB — D-DIMER, QUANTITATIVE: D-Dimer, Quant: >20 ug{FEU}/mL — ABNORMAL HIGH (ref 0.00–0.50)

## 2020-04-28 MED ORDER — SODIUM CHLORIDE 0.9 % IV SOLN: 250.0000 mL | INTRAVENOUS | Status: DC | PRN

## 2020-04-28 MED ORDER — POTASSIUM 99 MG PO TABS: 1.0000 | ORAL_TABLET | Freq: Every day | ORAL | Status: DC

## 2020-04-28 MED ORDER — ATORVASTATIN CALCIUM 10 MG PO TABS
10.0000 mg | ORAL_TABLET | Freq: Every day | ORAL | Status: DC
  Administered 2020-04-29 – 2020-05-03 (×5): 10 mg via ORAL
  Filled 2020-04-28 (×5): qty 1

## 2020-04-28 MED ORDER — BISACODYL 5 MG PO TBEC: 5.0000 mg | DELAYED_RELEASE_TABLET | Freq: Every day | ORAL | Status: DC | PRN

## 2020-04-28 MED ORDER — HYDROXYCHLOROQUINE SULFATE 200 MG PO TABS
200.0000 mg | ORAL_TABLET | Freq: Every morning | ORAL | Status: DC
  Administered 2020-04-29 – 2020-05-03 (×5): 200 mg via ORAL
  Filled 2020-04-28 (×5): qty 1

## 2020-04-28 MED ORDER — LEVOFLOXACIN 750 MG PO TABS
750.0000 mg | ORAL_TABLET | Freq: Every day | ORAL | Status: AC
  Administered 2020-04-29 – 2020-05-01 (×3): 750 mg via ORAL
  Filled 2020-04-28 (×3): qty 1

## 2020-04-28 MED ORDER — VITAMIN D 25 MCG (1000 UNIT) PO TABS
2000.0000 [IU] | ORAL_TABLET | Freq: Every day | ORAL | Status: DC
  Administered 2020-04-29 – 2020-05-03 (×5): 2000 [IU] via ORAL
  Filled 2020-04-28 (×5): qty 2

## 2020-04-28 MED ORDER — HEPARIN (PORCINE) 25000 UT/250ML-% IV SOLN
1200.0000 [IU]/h | INTRAVENOUS | Status: DC
  Administered 2020-04-28: 500 [IU]/h via INTRAVENOUS
  Administered 2020-04-29: 1100 [IU]/h via INTRAVENOUS
  Filled 2020-04-28 (×2): qty 250

## 2020-04-28 MED ORDER — VANCOMYCIN HCL 750 MG/150ML IV SOLN
750.0000 mg | Freq: Two times a day (BID) | INTRAVENOUS | Status: DC
  Administered 2020-04-29: 750 mg via INTRAVENOUS
  Filled 2020-04-28: qty 150

## 2020-04-28 MED ORDER — TRAZODONE HCL 50 MG PO TABS
50.0000 mg | ORAL_TABLET | Freq: Every day | ORAL | Status: DC
  Administered 2020-04-28 – 2020-05-05 (×8): 50 mg via ORAL
  Filled 2020-04-28 (×8): qty 1

## 2020-04-28 MED ORDER — MAGNESIUM GLUCONATE 500 MG PO TABS
500.0000 mg | ORAL_TABLET | Freq: Every day | ORAL | Status: DC
  Administered 2020-04-29 – 2020-05-03 (×5): 500 mg via ORAL
  Filled 2020-04-28 (×5): qty 1

## 2020-04-28 MED ORDER — ACETAMINOPHEN 650 MG RE SUPP: 650.0000 mg | Freq: Four times a day (QID) | RECTAL | Status: DC | PRN

## 2020-04-28 MED ORDER — METOPROLOL TARTRATE 25 MG PO TABS: 12.5000 mg | ORAL_TABLET | Freq: Two times a day (BID) | ORAL | Status: DC

## 2020-04-28 MED ORDER — OXYBUTYNIN CHLORIDE ER 10 MG PO TB24
10.0000 mg | ORAL_TABLET | Freq: Every day | ORAL | Status: DC
  Administered 2020-04-28 – 2020-05-02 (×5): 10 mg via ORAL
  Filled 2020-04-28 (×6): qty 1

## 2020-04-28 MED ORDER — FUROSEMIDE 10 MG/ML IJ SOLN: 40.0000 mg | Freq: Once | INTRAMUSCULAR | Status: DC

## 2020-04-28 MED ORDER — VANCOMYCIN HCL 2000 MG/400ML IV SOLN
2000.0000 mg | Freq: Once | INTRAVENOUS | Status: AC
  Administered 2020-04-28: 2000 mg via INTRAVENOUS
  Filled 2020-04-28: qty 400

## 2020-04-28 MED ORDER — ONDANSETRON HCL 4 MG PO TABS: 4.0000 mg | ORAL_TABLET | Freq: Four times a day (QID) | ORAL | Status: DC | PRN

## 2020-04-28 MED ORDER — PANTOPRAZOLE SODIUM 40 MG PO TBEC: 80.0000 mg | DELAYED_RELEASE_TABLET | Freq: Every day | ORAL | Status: DC

## 2020-04-28 MED ORDER — FUROSEMIDE 10 MG/ML IJ SOLN
20.0000 mg | Freq: Once | INTRAMUSCULAR | Status: DC
  Administered 2020-04-28: 20 mg via INTRAVENOUS
  Filled 2020-04-28: qty 2

## 2020-04-28 MED ORDER — ACETAMINOPHEN 325 MG PO TABS
650.0000 mg | ORAL_TABLET | Freq: Four times a day (QID) | ORAL | Status: DC | PRN
  Administered 2020-04-29 – 2020-05-05 (×5): 650 mg via ORAL
  Filled 2020-04-28 (×6): qty 2

## 2020-04-28 MED ORDER — SODIUM CHLORIDE 0.9% FLUSH: 3.0000 mL | INTRAVENOUS | Status: DC | PRN

## 2020-04-28 MED ORDER — BACLOFEN 10 MG PO TABS
5.0000 mg | ORAL_TABLET | Freq: Two times a day (BID) | ORAL | Status: DC | PRN
  Filled 2020-04-28: qty 1

## 2020-04-28 MED ORDER — ONDANSETRON HCL 4 MG/2ML IJ SOLN: 4.0000 mg | Freq: Four times a day (QID) | INTRAMUSCULAR | Status: DC | PRN

## 2020-04-28 MED ORDER — HEPARIN BOLUS VIA INFUSION
4500.0000 [IU] | Freq: Once | INTRAVENOUS | Status: AC
  Administered 2020-04-28: 4500 [IU] via INTRAVENOUS
  Filled 2020-04-28: qty 4500

## 2020-04-28 MED ORDER — SODIUM CHLORIDE 0.9% FLUSH
3.0000 mL | Freq: Two times a day (BID) | INTRAVENOUS | Status: DC
  Administered 2020-04-28: 3 mL via INTRAVENOUS

## 2020-04-28 MED ORDER — ALBUTEROL SULFATE (2.5 MG/3ML) 0.083% IN NEBU
3.0000 mL | INHALATION_SOLUTION | Freq: Four times a day (QID) | RESPIRATORY_TRACT | Status: DC | PRN
  Filled 2020-04-28: qty 3

## 2020-04-28 MED ORDER — SODIUM CHLORIDE 0.9 % IV SOLN
2.0000 g | Freq: Once | INTRAVENOUS | Status: AC
  Administered 2020-04-28: 2 g via INTRAVENOUS
  Filled 2020-04-28: qty 2

## 2020-04-28 MED ORDER — PROSOURCE PLUS PO LIQD
30.0000 mL | Freq: Two times a day (BID) | ORAL | Status: DC
  Administered 2020-04-29 – 2020-05-05 (×10): 30 mL via ORAL
  Filled 2020-04-28 (×10): qty 30

## 2020-04-28 NOTE — ED Triage Notes (Signed)
Per EMS pt was admitted and d/c end of Oct for Covid. Pt d/c home on 4 lNC. Per pt she has been SOB since discharge with today being progressively worse.   EMS reports O2 while ambulating dropped into 60s. Currently pt on NRB at 15L sating 96%  Pt alert oriented and airway patent

## 2020-04-28 NOTE — Progress Notes (Signed)
Pt transferred to room, o2 sats 70% on non rebreather.  Communicated with ED nurse Mickel Baas RN to see if medications were going to be given from 1730 she said they were not verified and was not going to chart against them.  Vanc bag was sitting on stretcher for me to give.    Oriented patient to floor.  Telemetry set up and assisted pt to [facility].

## 2020-04-28 NOTE — ED Provider Notes (Signed)
Amanda Ross   CSN: [id number] Arrival date & time: 05/10/2020  1526     History Chief Complaint  Patient presents with  . Shortness of Breath    Amanda Ross is a 74 y.o. female.  The history is provided by the patient, medical records and the EMS personnel.  Shortness of Breath  Amanda Ross is a 74 y.o. female who presents to the Emergency Department complaining of shortness of breath. She presents the emergency department by EMS for evaluation of acute on chronic shortness of breath. She was diagnosed with COVID-19 at the beginning of October it was discharged home from the hospital on 4 L nasal cannula. She states that she has been short of breath at baseline since hospital discharge but over the last 24 hours her shortness of breath has profoundly increased. She denies any fevers, chest pain, nausea, vomiting, abdominal pain. Symptoms are severe and constant nature. On EMS arrival she was ambulating on her baseline 4 L with oxygen sats in the 60s. She does not take any blood thinners. Symptoms are severe, constant, worsening.    Past Medical History:  Diagnosis Date  . Asthma   . GERD (gastroesophageal reflux disease)   . H/O bladder infections   . H/O measles   . H/O varicella   . Headache(784.0)    Frequently  . Hypertension   . OSA (obstructive sleep apnea)   . Osteoarthritis   . Yeast infection     Patient Active Problem List   Diagnosis Date Noted  . Acute hypoxemic respiratory failure ([place]) 05/02/2020  . Acute hypoxemic respiratory failure due to COVID-19 ([place]) 03/24/2020  . Vitamin D deficiency, unspecified 03/05/2018  . Mixed hyperlipidemia 03/05/2018  . 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 Pain in unspecified joint 03/05/2018  . Abnormal glucose 03/05/2018  . Trochanteric bursitis of both hips 07/30/2017  . Sjogren's syndrome ([place]) 05/22/2016  . Sicca syndrome, unspecified  05/22/2016  . High risk medication use 05/22/2016  . Primary hyperparathyroidism ([place]) 07/09/2013  . Left thyroid nodule 07/06/2013  . Dyspnea 10/11/2010    Past Surgical History:  Procedure Laterality Date  . BUNIONECTOMY    . CARPAL TUNNEL RELEASE    . dental work  08/2019  . LEFT HEART CATH AND CORONARY ANGIOGRAPHY N/A 04/15/2020   Procedure: LEFT HEART CATH AND CORONARY ANGIOGRAPHY;  Surgeon: Dixie Dials, MD;  Location: [facility];  Service: Cardiovascular;  Laterality: N/A;  . TOTAL SHOULDER ARTHROPLASTY       OB History    Gravida  4   Para  4   Term  3   Preterm  1   AB      Living  4     SAB      TAB      Ectopic      Multiple      Live Births  4           Family History  Problem Relation Age of Onset  . Asthma Daughter   . Heart disease Mother        CHF  . Deep vein thrombosis Mother   . Rheum arthritis Mother   . Heart disease Brother   . Rheum arthritis Sister     Social History   Tobacco Use  . Smoking status: Former Smoker    Packs/day: 1.00    Years: 40.00    Pack years: 40.00  Types: Cigarettes    Quit date: 06/18/2005    Years since quitting: 14.8  . Smokeless tobacco: Never Used  Vaping Use  . Vaping Use: Never used  Substance Use Topics  . Alcohol use: Not Currently    Comment: rare  . Drug use: No    Home Medications Prior to Admission medications   Medication Sig Start Date End Date Taking? Authorizing Provider  albuterol (VENTOLIN HFA) 108 (90 Base) MCG/ACT inhaler Inhale 1-2 puffs into the lungs every 6 (six) hours as needed for wheezing or shortness of breath. 03/23/20  Yes Bast, Traci A, NP  aspirin 81 MG tablet Take 81 mg by mouth daily.   Yes [provider]  atorvastatin (LIPITOR) 10 MG tablet Take 1 tablet (10 mg total) by mouth daily. 04/16/20  Yes Ghimire, Henreitta Leber, MD  baclofen (LIORESAL) 10 MG tablet TAKE 1/2 TABLET BY MOUTH TWICE DAILY AS NEEDED Patient taking differently: Take 5 mg  by mouth in the morning and at bedtime.  02/01/20  Yes [name], MD  Cholecalciferol (VITAMIN D3) 50 MCG (2000 UT) TABS Take 2,000 Units by mouth daily.   Yes [provider]  diclofenac Sodium (VOLTAREN) 1 % GEL Apply 2 g to 4 g to affected area up to 4 times daily as needed. Patient taking differently: Apply 2 g topically 4 (four) times daily as needed (to painful sites).  05/18/19  Yes Ofilia Neas, PA-C  Ensure (ENSURE) Take 237 mLs by mouth 2 (two) times daily between meals.   Yes [provider]  hydroxychloroquine (PLAQUENIL) 200 MG tablet Take 1 tablet (200 mg total) by mouth every morning. 03/07/20  Yes Ofilia Neas, PA-C  ipratropium (ATROVENT) 0.03 % nasal spray Place 2 sprays into both nostrils as needed. Patient taking differently: Place 2 sprays into both nostrils daily as needed for rhinitis.  10/27/19  Yes [name], MD  magnesium gluconate (MAGONATE) 500 MG tablet Take 500 mg by mouth daily.    Yes [provider]  metoprolol tartrate (LOPRESSOR) 25 MG tablet TAKE 1/2 TABLET BY MOUTH TWICE DAILY Patient taking differently: Take 12.5 mg by mouth 2 (two) times daily.  01/07/20  Yes [name], MD  omeprazole (PRILOSEC) 20 MG capsule TAKE 2 CAPSULES(40 MG) BY MOUTH DAILY Patient taking differently: Take 40 mg by mouth daily.  09/14/19  Yes [name], MD  oxybutynin (DITROPAN-XL) 10 MG 24 hr tablet Take 10 mg by mouth at bedtime.   Yes [provider]  Potassium 99 MG TABS Take 99 mg by mouth daily.    Yes [provider]  traZODone (DESYREL) 50 MG tablet TAKE 1 TABLET BY MOUTH EVERY NIGHT AT BEDTIME Patient taking differently: Take 50 mg by mouth at bedtime.  10/12/19  Yes Minette Brine, FNP  Nutritional Supplements (,FEEDING SUPPLEMENT, PROSOURCE PLUS) liquid Take 30 mLs by mouth 2 (two) times daily between meals. Patient not taking: Reported on 04/24/2020 04/16/20 05/16/20  Jonetta Osgood, MD    Allergies    Ivp  dye [iodinated diagnostic agents], Percocet [oxycodone-acetaminophen], Iohexol, Penicillins, and Prednisone  Review of Systems   Review of Systems  Respiratory: Positive for shortness of breath.   All other systems reviewed and are negative.   Physical Exam Updated Vital Signs BP 120/71 (BP Location: Right Arm)   Pulse 99   Temp 99 F (37.2 C) (Oral)   Resp (!) 28   Ht 5\' 3"  (1.6 m)   Wt 96.1 kg  SpO2 95%   BMI 37.53 kg/m   Physical Exam Vitals and nursing Ross reviewed.  Constitutional:      General: She is in acute distress.     Appearance: She is well-developed. She is ill-appearing.  HENT:     Head: Normocephalic and atraumatic.  Cardiovascular:     Rate and Rhythm: Normal rate and regular rhythm.     Heart sounds: No murmur heard.   Pulmonary:     Effort: Respiratory distress present.     Comments: Diffuse crackles bilaterally, greatest in the lung bases Abdominal:     Palpations: Abdomen is soft.     Tenderness: There is no abdominal tenderness. There is no guarding or rebound.  Musculoskeletal:        General: No swelling or tenderness.  Skin:    General: Skin is warm and dry.  Neurological:     Mental Status: She is alert and oriented to person, place, and time.  Psychiatric:        Behavior: Behavior normal.     ED Results / Procedures / Treatments   Labs (all labs ordered are listed, but only abnormal results are displayed) Labs Reviewed  CBC WITH DIFFERENTIAL/PLATELET - Abnormal; Notable for the following components:      Result Value   RDW 19.9 (*)    All other components within normal limits  BRAIN NATRIURETIC PEPTIDE - Abnormal; Notable for the following components:   B Natriuretic Peptide 592.1 (*)    All other components within normal limits  COMPREHENSIVE METABOLIC PANEL - Abnormal; Notable for the following components:   CO2 21 (*)    Glucose, Bld 116 (*)    Creatinine, Ser 1.03 (*)    Calcium 10.8 (*)    Total Protein 6.0 (*)     Albumin 3.0 (*)    GFR, Estimated 57 (*)    All other components within normal limits  D-DIMER, QUANTITATIVE (NOT AT [facility]) - Abnormal; Notable for the following components:   D-Dimer, Quant >20.00 (*)    All other components within normal limits  C-REACTIVE PROTEIN - Abnormal; Notable for the following components:   CRP 10.9 (*)    All other components within normal limits  TROPONIN I (HIGH SENSITIVITY) - Abnormal; Notable for the following components:   Troponin I (High Sensitivity) 87 (*)    All other components within normal limits  TROPONIN I (HIGH SENSITIVITY) - Abnormal; Notable for the following components:   Troponin I (High Sensitivity) 103 (*)    All other components within normal limits  CULTURE, BLOOD (ROUTINE X 2)  CULTURE, BLOOD (ROUTINE X 2)  EXPECTORATED SPUTUM ASSESSMENT W REFEX TO RESP CULTURE  PROTIME-INR  LACTIC ACID, PLASMA  LACTIC ACID, PLASMA  PROCALCITONIN  PROCALCITONIN  HEPARIN LEVEL (UNFRACTIONATED)  CBC  BRAIN NATRIURETIC PEPTIDE  CBC WITH DIFFERENTIAL/PLATELET  MAGNESIUM  D-DIMER, QUANTITATIVE (NOT AT [facility])  C-REACTIVE PROTEIN  COMPREHENSIVE METABOLIC PANEL    EKG EKG Interpretation  Date/Time:  Thursday April 28 2020 15:27:41 EST Ventricular Rate:  103 PR Interval:    QRS Duration: 96 QT Interval:  332 QTC Calculation: 435 R Axis:   -44 Text Interpretation: Sinus tachycardia Left anterior fascicular block Abnormal R-wave progression, late transition Left ventricular hypertrophy Nonspecific T abnormalities, anterior leads Confirmed by Quintella Reichert [phone]) on 05/10/2020 3:29:54 PM   Radiology DG Chest Port 1 View  Result Date: 05/11/2020 CLINICAL DATA:  Shortness of breath EXAM: PORTABLE CHEST 1 VIEW COMPARISON:  04/03/2020, 03/31/2020 CT 03/25/2020  FINDINGS: Significant interval worsening right greater than left pulmonary airspace opacities concerning for bilateral pneumonia. Enlarged cardiomediastinal silhouette with aortic  atherosclerosis. No significant effusion. No pneumothorax. IMPRESSION: Significant interval worsening of right greater than left pulmonary airspace opacities concerning for bilateral pneumonia. Electronically Signed   By: Donavan Foil M.D.   On: 04/23/2020 17:03   VAS [place] LOWER EXTREMITY VENOUS (DVT) (ONLY [facility] & [facility])  Result Date: 04/27/2020  Lower Venous DVT Study Indications: Recent COVID infection 03/2020, now with D-dimer >20 and hypoxia.  Limitations: Poor ultrasound/tissue interface. Comparison Study: 03/26/2020 negative lower extremity venous duplex Performing Technologist: Maudry Mayhew MHA, RDMS, RVT, RDCS  Examination Guidelines: A complete evaluation includes B-mode imaging, spectral Doppler, color Doppler, and power Doppler as needed of all accessible portions of each vessel. Bilateral testing is considered an integral part of a complete examination. Limited examinations for reoccurring indications may be performed as noted. The reflux portion of the exam is performed with the patient in reverse Trendelenburg.  +---------+---------------+---------+-----------+----------+--------------+ RIGHT    CompressibilityPhasicitySpontaneityPropertiesThrombus Aging +---------+---------------+---------+-----------+----------+--------------+ CFV      Full           No       Yes                                 +---------+---------------+---------+-----------+----------+--------------+ SFJ      Full                                                        +---------+---------------+---------+-----------+----------+--------------+ FV Prox  Full                                                        +---------+---------------+---------+-----------+----------+--------------+ FV Mid   Full                                                        +---------+---------------+---------+-----------+----------+--------------+ FV DistalFull                                                         +---------+---------------+---------+-----------+----------+--------------+ PFV      Full                                                        +---------+---------------+---------+-----------+----------+--------------+ POP      Full           No       Yes                                 +---------+---------------+---------+-----------+----------+--------------+  PTV      Full                                                        +---------+---------------+---------+-----------+----------+--------------+ PERO     Full                                                        +---------+---------------+---------+-----------+----------+--------------+   +---------+---------------+---------+-----------+----------+--------------+ LEFT     CompressibilityPhasicitySpontaneityPropertiesThrombus Aging +---------+---------------+---------+-----------+----------+--------------+ CFV      Full           No       Yes                                 +---------+---------------+---------+-----------+----------+--------------+ SFJ      Full                                                        +---------+---------------+---------+-----------+----------+--------------+ FV Prox  None                                         Acute          +---------+---------------+---------+-----------+----------+--------------+ FV Mid   None                                         Acute          +---------+---------------+---------+-----------+----------+--------------+ FV DistalNone                                         Acute          +---------+---------------+---------+-----------+----------+--------------+ PFV      Full                                                        +---------+---------------+---------+-----------+----------+--------------+ POP      None                    No                   Acute           +---------+---------------+---------+-----------+----------+--------------+ PTV      None                                         Acute          +---------+---------------+---------+-----------+----------+--------------+  Left Technical Findings: Not visualized segments include peroneal veins not adequately visualized.   Summary: RIGHT: - There is no evidence of deep vein thrombosis in the lower extremity.  - No cystic structure found in the popliteal fossa.  LEFT: - Findings consistent with acute deep vein thrombosis involving the left femoral vein, left popliteal vein, and left posterior tibial veins. - No cystic structure found in the popliteal fossa.  Lower extremity venous flow is pulsatile, suggestive of possibly elevated right heart pressure.  *See table(s) above for measurements and observations.    Preliminary     Procedures Procedures (including critical care time) CRITICAL CARE Performed by: Quintella Reichert   Total critical care time: 40 minutes  Critical care time was exclusive of separately billable procedures and treating other patients.  Critical care was necessary to treat or prevent imminent or life-threatening deterioration.  Critical care was time spent personally by me on the following activities: development of treatment plan with patient and/or surrogate as well as nursing, discussions with consultants, evaluation of patient's response to treatment, examination of patient, obtaining history from patient or surrogate, ordering and performing treatments and interventions, ordering and review of laboratory studies, ordering and review of radiographic studies, pulse oximetry and re-evaluation of patient's condition.  Medications Ordered in ED Medications  heparin ADULT infusion 100 units/mL (25000 units/256mL sodium chloride 0.45%) (500 Units/hr Intravenous New Bag/Given 05/17/2020 1757)  cholecalciferol (VITAMIN D3) tablet 2,000 Units (has no administration in time range)    magnesium gluconate (MAGONATE) tablet 500 mg (has no administration in time range)  pantoprazole (PROTONIX) EC tablet 80 mg (has no administration in time range)  traZODone (DESYREL) tablet 50 mg (50 mg Oral Given 04/23/2020 2343)  baclofen (LIORESAL) tablet 5 mg (has no administration in time range)  oxybutynin (DITROPAN-XL) 24 hr tablet 10 mg (10 mg Oral Given 04/27/2020 2343)  hydroxychloroquine (PLAQUENIL) tablet 200 mg (has no administration in time range)  albuterol (PROVENTIL) (2.5 MG/3ML) 0.083% nebulizer solution 3 mL (has no administration in time range)  atorvastatin (LIPITOR) tablet 10 mg (has no administration in time range)  (feeding supplement) PROSource Plus liquid 30 mL (has no administration in time range)  vancomycin (VANCOREADY) IVPB 750 mg/150 mL (has no administration in time range)  levofloxacin (LEVAQUIN) tablet 750 mg (has no administration in time range)  furosemide (LASIX) injection 40 mg (0 mg Intravenous Hold 04/21/2020 1753)  sodium chloride flush (NS) 0.9 % injection 3 mL (3 mLs Intravenous Given 04/24/2020 2343)  sodium chloride flush (NS) 0.9 % injection 3 mL (has no administration in time range)  0.9 %  sodium chloride infusion (has no administration in time range)  acetaminophen (TYLENOL) tablet 650 mg (has no administration in time range)    Or  acetaminophen (TYLENOL) suppository 650 mg (has no administration in time range)  bisacodyl (DULCOLAX) EC tablet 5 mg (has no administration in time range)  ondansetron (ZOFRAN) tablet 4 mg (has no administration in time range)    Or  ondansetron (ZOFRAN) injection 4 mg (has no administration in time range)  heparin bolus via infusion 4,500 Units (4,500 Units Intravenous Bolus from Bag 05/16/2020 1757)  ceFEPIme (MAXIPIME) 2 g in sodium chloride 0.9 % 100 mL IVPB (0 g Intravenous Stopped 05/09/2020 1825)  vancomycin (VANCOREADY) IVPB 2000 mg/400 mL (2,000 mg Intravenous New Bag/Given 05/06/2020 1854)    ED Course  I have  reviewed the triage vital signs and the nursing notes.  Pertinent labs & imaging results that were available during  my care of the patient were reviewed by me and considered in my medical decision making (see chart for details).    MDM Rules/Calculators/A&P                          Patient with history of COVID-19 infection in October of this year with chronic oxygen dependence at home here for evaluation of worsening shortness of breath today. She is ill appearing on evaluation with tachypnea, increased oxygen requirement. Concern for massive PE and she was started on heparin empirically. Given history of prednisone allergy in contrast allergy CTA will not be obtained at this time. Vascular ultrasound is positive for large left lower extremity DVT. Chest x-ray with possible pneumonia, will treat with antibiotics pending further workup. She is volume overloaded on evaluation, will treat with Lasix for diuresis. Discussed with patient findings of studies recommendation for mission and she is in agreement treatment. Hospitalist consulted for admission.  Final Clinical Impression(s) / ED Diagnoses Final diagnoses:  Other acute pulmonary embolism with acute cor pulmonale [facility])    Rx / DC Orders ED Discharge Orders    None       Quintella Reichert, MD 04/29/20 0007

## 2020-04-28 NOTE — H&P (Addendum)
TRH H&P   Patient Demographics:    Amanda Ross, is a 74 y.o. female  MRN: [id number]   DOB - Sep 17, 1945  Admit Date - 04/29/2020  Outpatient Primary MD for the patient is [name], MD     Patient coming from: Home >> ER  Chief Complaint  Patient presents with  . Shortness of Breath      HPI:    Amanda Ross  is a 74 y.o. female, with recent treated Covid 19 PNA > 3 weeks ago, Sjogren's syndrome, hypertension, asthma, left thyroid nodule, who was discharged about 3-1/2 weeks ago after treatment of COVID-19 pneumonia presents to the ER with gradually progressive shortness of breath which started about a week after she went home, she has also noticed some swelling in her left lower extremity, also noticed a change that she has slight difficulty in laying flat at night and now using 2 pillows.  In the ER she was diagnosed with acute hypoxic respiratory failure, extremely elevated D-dimer, acute left lower extremity DVT, due to allergy to IV dye CT scan could not be done.  She also had an elevated BNP, she was diagnosed with left lower extremity DVT and admitted for further work-up for acute hypoxic respiratory failure.  She currently denies any fever chills, no chest or abdominal pain, she does have mildly productive cough with yellowish tinge to it which has been going on for last few days, no exposure to sick contacts or long travels, no dysuria, no blood in stool or urine, no diarrhea, no focal weakness.    Review of systems:    A full 10 point Review of Systems was done, except as stated above, all other Review of Systems were negative.   With Past History of the following :    Past Medical  History:  Diagnosis Date  . Asthma   . GERD (gastroesophageal reflux disease)   . H/O bladder infections   . H/O measles   . H/O varicella   . Headache(784.0)    Frequently  . Hypertension   . OSA (obstructive sleep apnea)   . Osteoarthritis   . Yeast infection       Past Surgical History:  Procedure Laterality Date  . BUNIONECTOMY    . CARPAL TUNNEL RELEASE    . dental work  08/2019  .  LEFT HEART CATH AND CORONARY ANGIOGRAPHY N/A 04/15/2020   Procedure: LEFT HEART CATH AND CORONARY ANGIOGRAPHY;  Surgeon: Dixie Dials, MD;  Location: [place] [facility];  Service: Cardiovascular;  Laterality: N/A;  . TOTAL SHOULDER ARTHROPLASTY        Social History:     Social History   Tobacco Use  . Smoking status: Former Smoker    Packs/day: 1.00    Years: 40.00    Pack years: 40.00    Types: Cigarettes    Quit date: 06/18/2005    Years since quitting: 14.8  . Smokeless tobacco: Never Used  Substance Use Topics  . Alcohol use: Not Currently    Comment: rare         Family History :     Family History  Problem Relation Age of Onset  . Asthma Daughter   . Heart disease Mother        CHF  . Deep vein thrombosis Mother   . Rheum arthritis Mother   . Heart disease Brother   . Rheum arthritis Sister        Home Medications:   Prior to Admission medications   Medication Sig Start Date End Date Taking? Authorizing Provider  albuterol (VENTOLIN HFA) 108 (90 Base) MCG/ACT inhaler Inhale 1-2 puffs into the lungs every 6 (six) hours as needed for wheezing or shortness of breath. 03/23/20   Loura Halt A, NP  aspirin 81 MG tablet Take 81 mg by mouth daily.    [provider]  atorvastatin (LIPITOR) 10 MG tablet Take 1 tablet (10 mg total) by mouth daily. 04/16/20   Ghimire, Henreitta Leber, MD  baclofen (LIORESAL) 10 MG tablet TAKE 1/2 TABLET BY MOUTH TWICE DAILY AS NEEDED Patient taking differently: Take 10-15 mg by mouth 2 (two) times daily as needed for muscle spasms.   02/01/20   [name], MD  Cholecalciferol (VITAMIN D) 1000 UNITS capsule Take 2,000 Units by mouth daily.     [provider]  diclofenac Sodium (VOLTAREN) 1 % GEL Apply 2 g to 4 g to affected area up to 4 times daily as needed. Patient taking differently: Apply 2 g topically 4 (four) times daily as needed (pain).  05/18/19   Ofilia Neas, PA-C  hydroxychloroquine (PLAQUENIL) 200 MG tablet Take 1 tablet (200 mg total) by mouth every morning. 03/07/20   Ofilia Neas, PA-C  ipratropium (ATROVENT) 0.03 % nasal spray Place 2 sprays into both nostrils as needed. 10/27/19   [name], MD  magnesium gluconate (MAGONATE) 500 MG tablet Take 500 mg by mouth daily.     [provider]  metoprolol tartrate (LOPRESSOR) 25 MG tablet TAKE 1/2 TABLET BY MOUTH TWICE DAILY 01/07/20   [name], MD  Nutritional Supplements (,FEEDING SUPPLEMENT, PROSOURCE PLUS) liquid Take 30 mLs by mouth 2 (two) times daily between meals. 04/16/20 05/16/20  Ghimire, Henreitta Leber, MD  omeprazole (PRILOSEC) 20 MG capsule TAKE 2 CAPSULES(40 MG) BY MOUTH DAILY Patient taking differently: Take 40 mg by mouth daily.  09/14/19   [name], MD  oxybutynin (DITROPAN-XL) 10 MG 24 hr tablet Take 10 mg by mouth at bedtime.    [provider]  Potassium 99 MG TABS Take 1 tablet by mouth daily.    [provider]  traZODone (DESYREL) 50 MG tablet TAKE 1 TABLET BY MOUTH EVERY NIGHT AT BEDTIME 10/12/19   Minette Brine, FNP     Allergies:     Allergies  Allergen Reactions  .  Ivp Dye [Iodinated Diagnostic Agents] Hives  . Percocet [Oxycodone-Acetaminophen] Nausea And Vomiting  . Iohexol   . Penicillins Hives    Did it involve swelling of the face/tongue/throat, SOB, or low BP? N Did it involve sudden or severe rash/hives, skin peeling, or any reaction on the inside of your mouth or nose? Y Did you need to seek medical attention at a hospital or doctor's office? N When did it last  happen?Several Years Ago If all above answers are "NO", may proceed with cephalosporin use.   . Prednisone Nausea Only     Physical Exam:   Vitals  Blood pressure 116/77, pulse 96, temperature 99.5 F (37.5 C), temperature source Oral, resp. rate (!) 24, height 5\' 3"  (1.6 m), weight 96.1 kg, SpO2 97 %.   1. General elderly, obese African-American female lying in hospital bed wearing nonrebreather mask in no distress,  2. Normal affect and insight, Not Suicidal or Homicidal, Awake Alert,   3. No F.N deficits, ALL C.Nerves Intact, Strength 5/5 all 4 extremities, Sensation intact all 4 extremities, Plantars down going.  4. Ears and Eyes appear Normal, Conjunctivae clear, PERRLA. Moist Oral Mucosa.  5. Supple Neck, No JVD, No cervical lymphadenopathy appriciated, No Carotid Bruits.  6. Symmetrical Chest wall movement, Good air movement bilaterally, bibasilar fine crackles,  7. RRR, No Gallops, Rubs or Murmurs, No Parasternal Heave.  8. Positive Bowel Sounds, Abdomen Soft, No tenderness, No organomegaly appriciated,No rebound -guarding or rigidity.  9.  No Cyanosis, Normal Skin Turgor, No Skin Rash or Bruise.  Left lower extremity swollen as compared to right,  10. Good muscle tone,  joints appear normal , no effusions, Normal ROM.  11. No Palpable Lymph Nodes in Neck or Axillae      Data Review:    CBC Recent Labs  Lab 05/16/2020 1536  WBC 9.1  HGB 12.3  HCT 37.9  PLT 155  MCV 97.7  MCH 31.7  MCHC 32.5  RDW 19.9*  LYMPHSABS 0.9  MONOABS 1.0  EOSABS 0.2  BASOSABS 0.1   ------------------------------------------------------------------------------------------------------------------  Chemistries  Recent Labs  Lab 05/13/2020 1536  NA 141  K 4.3  CL 111  CO2 21*  GLUCOSE 116*  BUN 16  CREATININE 1.03*  CALCIUM 10.8*  AST 19  ALT 24  ALKPHOS 86  BILITOT 1.0    ------------------------------------------------------------------------------------------------------------------ estimated creatinine clearance is 52.9 mL/min (A) (by C-G formula based on SCr of 1.03 mg/dL (H)). ------------------------------------------------------------------------------------------------------------------ No results for input(s): TSH, T4TOTAL, T3FREE, THYROIDAB in the last 72 hours.  Invalid input(s): FREET3  Coagulation profile Recent Labs  Lab 05/07/2020 1536  INR 1.2   ------------------------------------------------------------------------------------------------------------------- Recent Labs    05/17/2020 1536  DDIMER >20.00*   -------------------------------------------------------------------------------------------------------------------  Cardiac Enzymes No results for input(s): CKMB, TROPONINI, MYOGLOBIN in the last 168 hours.  Invalid input(s): CK ------------------------------------------------------------------------------------------------------------------    Component Value Date/Time   BNP 592.1 (H) 04/27/2020 1536     ---------------------------------------------------------------------------------------------------------------  Urinalysis    Component Value Date/Time   COLORURINE YELLOW 10/21/2019 [place] 10/21/2019 1126   LABSPEC 1.016 10/21/2019 1126   PHURINE 5.5 10/21/2019 [place] 10/21/2019 1126   [place] 10/21/2019 1126   BILIRUBINUR negative 04/02/2018 1112   [place] 10/21/2019 [place] 10/21/2019 1126   UROBILINOGEN 0.2 04/02/2018 1112   NITRITE NEGATIVE 10/21/2019 1126   [place] 10/21/2019 1126    ----------------------------------------------------------------------------------------------------------------   Imaging Results:    DG Chest Port 1 [id number]  Result Date: 05/03/2020 CLINICAL DATA:  Shortness of breath EXAM: PORTABLE  CHEST 1 VIEW COMPARISON:  04/03/2020, 03/31/2020 CT 03/25/2020 FINDINGS: Significant interval worsening right greater than left pulmonary airspace opacities concerning for bilateral pneumonia. Enlarged cardiomediastinal silhouette with aortic atherosclerosis. No significant effusion. No pneumothorax. IMPRESSION: Significant interval worsening of right greater than left pulmonary airspace opacities concerning for bilateral pneumonia. Electronically Signed   By: Donavan Foil M.D.   On: 04/23/2020 17:03   VAS [place] LOWER EXTREMITY VENOUS (DVT) (ONLY [facility] & [facility])  Result Date: 05/15/2020  Lower Venous DVT Study Indications: Recent COVID infection 03/2020, now with D-dimer >20 and hypoxia.  Limitations: Poor ultrasound/tissue interface. Comparison Study: 03/26/2020 negative lower extremity venous duplex Performing Technologist: Maudry Mayhew MHA, RDMS, RVT, RDCS  Examination Guidelines: A complete evaluation includes B-mode imaging, spectral Doppler, color Doppler, and power Doppler as needed of all accessible portions of each vessel. Bilateral testing is considered an integral part of a complete examination. Limited examinations for reoccurring indications may be performed as noted. The reflux portion of the exam is performed with the patient in reverse Trendelenburg.  +---------+---------------+---------+-----------+----------+--------------+ RIGHT    CompressibilityPhasicitySpontaneityPropertiesThrombus Aging +---------+---------------+---------+-----------+----------+--------------+ CFV      Full           No       Yes                                 +---------+---------------+---------+-----------+----------+--------------+ SFJ      Full                                                        +---------+---------------+---------+-----------+----------+--------------+ FV Prox  Full                                                         +---------+---------------+---------+-----------+----------+--------------+ FV Mid   Full                                                        +---------+---------------+---------+-----------+----------+--------------+ FV DistalFull                                                        +---------+---------------+---------+-----------+----------+--------------+ PFV      Full                                                        +---------+---------------+---------+-----------+----------+--------------+ POP      Full           No       Yes                                 +---------+---------------+---------+-----------+----------+--------------+  PTV      Full                                                        +---------+---------------+---------+-----------+----------+--------------+ PERO     Full                                                        +---------+---------------+---------+-----------+----------+--------------+   +---------+---------------+---------+-----------+----------+--------------+ LEFT     CompressibilityPhasicitySpontaneityPropertiesThrombus Aging +---------+---------------+---------+-----------+----------+--------------+ CFV      Full           No       Yes                                 +---------+---------------+---------+-----------+----------+--------------+ SFJ      Full                                                        +---------+---------------+---------+-----------+----------+--------------+ FV Prox  None                                         Acute          +---------+---------------+---------+-----------+----------+--------------+ FV Mid   None                                         Acute          +---------+---------------+---------+-----------+----------+--------------+ FV DistalNone                                         Acute           +---------+---------------+---------+-----------+----------+--------------+ PFV      Full                                                        +---------+---------------+---------+-----------+----------+--------------+ POP      None                    No                   Acute          +---------+---------------+---------+-----------+----------+--------------+ PTV      None                                         Acute          +---------+---------------+---------+-----------+----------+--------------+  Left Technical Findings: Not visualized segments include peroneal veins not adequately visualized.   Summary: RIGHT: - There is no evidence of deep vein thrombosis in the lower extremity.  - No cystic structure found in the popliteal fossa.  LEFT: - Findings consistent with acute deep vein thrombosis involving the left femoral vein, left popliteal vein, and left posterior tibial veins. - No cystic structure found in the popliteal fossa.  Lower extremity venous flow is pulsatile, suggestive of possibly elevated right heart pressure.  *See table(s) above for measurements and observations.    Preliminary     My personal review of EKG: Rhythm sinus tachycardia 93 bpm with nonspecific ST changes   Assessment & Plan:      1.  Acute hypoxic respiratory failure in a patient with recently treated COVID-19 pneumonia.  Her presentation is certainly suspicious for PE, she has an acute DVT and currently on heparin drip however she also has history of orthopnea which has developed recently will limited BNP.  At this time she has been placed on heparin drip, due to IV allergy will get a VQ scan, CTA with rides but she refused as she had recently a very poor experience with it, she already has a DVT which is acute in the left lower extremity hence management will not change much in terms of anticoagulation.  For elevated BNP lung with orthopnea will give her IV Lasix, repeat echocardiogram and  monitor.   2.  Productive cough could be possible CAP versus bronchitis.  Acute infection cannot be ruled out, check sputum Gram stain culture 4 days of Levaquin.  3.  GERD.  Continue PPI.  4.  Troponin.  Likely due to demand ischemia from #1 above.  Check echocardiogram to evaluate EF and wall motion and monitor.  Nonacute EKG and no chest pain.  5.  Morbid obesity.  BMI 37.  Follow with PCP for weight loss.  6.  Sjogren's syndrome.  Continue Plaquenil and artificial tears.  7.  OSA.  Oxygen at night.  8.  Dyslipidemia.  Continue home dose statin.  9.  Essential hypertension.  Blood pressure on the softer side, since diuresing will hold beta-blocker.     DVT Prophylaxis Heparin  GTT  AM Labs Ordered, also please review Full Orders  Family Communication: Admission, patients condition and plan of care including tests being ordered have been discussed with the patient   who indicates understanding and agree with the plan and Code Status.  Code Status Full  Likely DC to  Home  Condition GUARDED    Consults called: None    Admission status: Inpt    Time spent in minutes : 35   Lala Lund M.D on 05/15/2020 at 5:42 PM  To page go to [email or web address] - password [facility]

## 2020-04-28 NOTE — ED Notes (Signed)
Attempted to call report. No answer.

## 2020-04-28 NOTE — Progress Notes (Signed)
Bilateral lower extremity venous duplex completed. Refer to "CV Proc" under chart review to view preliminary results.  Preliminary results discussed with Dr. Ralene Bathe.  05/13/2020 5:27 PM Kelby Aline., MHA, RVT, RDCS, RDMS

## 2020-04-28 NOTE — Progress Notes (Signed)
ANTICOAGULATION CONSULT NOTE - Initial Consult  Pharmacy Consult for heparin Indication: DVT and r/o PE  Allergies  Allergen Reactions  . Ivp Dye [Iodinated Diagnostic Agents] Hives  . Percocet [Oxycodone-Acetaminophen] Nausea And Vomiting  . Iohexol   . Penicillins Hives    Did it involve swelling of the face/tongue/throat, SOB, or low BP? N Did it involve sudden or severe rash/hives, skin peeling, or any reaction on the inside of your mouth or nose? Y Did you need to seek medical attention at a hospital or doctor's office? N When did it last happen?Several Years Ago If all above answers are "NO", may proceed with cephalosporin use.   . Prednisone Nausea Only    Patient Measurements: Height: 5\' 3"  (160 cm) Weight: 96.1 kg (211 lb 13.8 oz) IBW/kg (Calculated) : 52.4 Heparin Dosing Weight: 74.7kg  Vital Signs: Temp: 99.5 F (37.5 C) (11/11 1529) Temp Source: Oral (11/11 1529) BP: 116/80 (11/11 1529)  Labs: Recent Labs    05/16/2020 1536  HGB 12.3  HCT 37.9  PLT 155  LABPROT 14.3  INR 1.2  CREATININE 1.03*  TROPONINIHS 87*    Estimated Creatinine Clearance: 52.9 mL/min (A) (by C-G formula based on SCr of 1.03 mg/dL (H)).   Medical History: Past Medical History:  Diagnosis Date  . Asthma   . GERD (gastroesophageal reflux disease)   . H/O bladder infections   . H/O measles   . H/O varicella   . Headache(784.0)    Frequently  . Hypertension   . OSA (obstructive sleep apnea)   . Osteoarthritis   . Yeast infection     Medications:  Infusions:  . ceFEPime (MAXIPIME) IV    . heparin    . vancomycin    . [START ON 04/29/2020] vancomycin      Assessment: 5 yof presented to the ED with worsening SOB. She was recently discharged after treatment for COVID requiring 4L O2 at discharge. D-dimer is >20 and now starting IV heparin for DVT and r/o PE. Baseline CBC is WNL.   Goal of Therapy:  Heparin level 0.3-0.7 units/ml Monitor platelets by  anticoagulation protocol: Yes   Plan:  Heparin bolus 4500 units IV x 1 Heparin gtt 500 units/hr - This is a low weight based rate but she was therapeutic on this a couple of weeks ago Check an 8 hr HL Daily HL and CBC  Sarajean Dessert, Rande Lawman 05/12/2020,5:13 PM

## 2020-04-28 NOTE — ED Notes (Signed)
Dinner Tray Ordered @ 1747. 

## 2020-04-28 NOTE — Progress Notes (Signed)
Pharmacy Antibiotic Note  Amanda Ross is a 74 y.o. female admitted on 04/25/2020 with pneumonia.  Pharmacy has been consulted for vancomycin dosing. Pt is afebrile and WBC is WNL. Scr is WNL at 1.03. Pt recently admitted with COVID.   Plan: Vancomycin 2gm IV x 1 then 750mg  IV Q12H F/u renal fxn, C&S, clinical status and trough at [facility] F/u continuation of gram neg coverage  Height: 5\' 3"  (160 cm) Weight: 96.1 kg (211 lb 13.8 oz) IBW/kg (Calculated) : 52.4  Temp (24hrs), Avg:99.5 F (37.5 C), Min:99.5 F (37.5 C), Max:99.5 F (37.5 C)  Recent Labs  Lab 05/07/2020 1536  WBC 9.1  CREATININE 1.03*    Estimated Creatinine Clearance: 52.9 mL/min (A) (by C-G formula based on SCr of 1.03 mg/dL (H)).    Allergies  Allergen Reactions  . Ivp Dye [Iodinated Diagnostic Agents] Hives  . Percocet [Oxycodone-Acetaminophen] Nausea And Vomiting  . Iohexol   . Penicillins Hives    Did it involve swelling of the face/tongue/throat, SOB, or low BP? N Did it involve sudden or severe rash/hives, skin peeling, or any reaction on the inside of your mouth or nose? Y Did you need to seek medical attention at a hospital or doctor's office? N When did it last happen?Several Years Ago If all above answers are "NO", may proceed with cephalosporin use.   . Prednisone Nausea Only    Antimicrobials this admission: Vanc 11/11>> Cefepime x 1 11/11  Dose adjustments this admission: N/A  Microbiology results: Pending  Thank you for allowing pharmacy to be a part of this patient's care.  Demitris Pokorny, Rande Lawman 05/11/2020 5:13 PM

## 2020-04-29 ENCOUNTER — Inpatient Hospital Stay (HOSPITAL_COMMUNITY): Payer: Medicare Other

## 2020-04-29 ENCOUNTER — Ambulatory Visit: Payer: Self-pay

## 2020-04-29 DIAGNOSIS — I129 Hypertensive chronic kidney disease with stage 1 through stage 4 chronic kidney disease, or unspecified chronic kidney disease: Secondary | ICD-10-CM

## 2020-04-29 DIAGNOSIS — I2609 Other pulmonary embolism with acute cor pulmonale: Secondary | ICD-10-CM

## 2020-04-29 DIAGNOSIS — I5031 Acute diastolic (congestive) heart failure: Secondary | ICD-10-CM

## 2020-04-29 DIAGNOSIS — U071 COVID-19: Secondary | ICD-10-CM

## 2020-04-29 DIAGNOSIS — N1831 Chronic kidney disease, stage 3a: Secondary | ICD-10-CM

## 2020-04-29 DIAGNOSIS — J9601 Acute respiratory failure with hypoxia: Secondary | ICD-10-CM | POA: Diagnosis not present

## 2020-04-29 LAB — CBC WITH DIFFERENTIAL/PLATELET
Abs Immature Granulocytes: 0.07 10*3/uL (ref 0.00–0.07)
Basophils Absolute: 0 10*3/uL (ref 0.0–0.1)
Basophils Relative: 1 %
Eosinophils Absolute: 0.3 10*3/uL (ref 0.0–0.5)
Eosinophils Relative: 3 %
HCT: 35.1 % — ABNORMAL LOW (ref 36.0–46.0)
Hemoglobin: 11.2 g/dL — ABNORMAL LOW (ref 12.0–15.0)
Immature Granulocytes: 1 %
Lymphocytes Relative: 18 %
Lymphs Abs: 1.5 10*3/uL (ref 0.7–4.0)
MCH: 30.8 pg (ref 26.0–34.0)
MCHC: 31.9 g/dL (ref 30.0–36.0)
MCV: 96.4 fL (ref 80.0–100.0)
Monocytes Absolute: 1 10*3/uL (ref 0.1–1.0)
Monocytes Relative: 13 %
Neutro Abs: 5.1 10*3/uL (ref 1.7–7.7)
Neutrophils Relative %: 64 %
Platelets: 157 10*3/uL (ref 150–400)
RBC: 3.64 MIL/uL — ABNORMAL LOW (ref 3.87–5.11)
RDW: 19.2 % — ABNORMAL HIGH (ref 11.5–15.5)
WBC: 8 10*3/uL (ref 4.0–10.5)
nRBC: 0 % (ref 0.0–0.2)

## 2020-04-29 LAB — C-REACTIVE PROTEIN: CRP: 12 mg/dL — ABNORMAL HIGH (ref <1.0)

## 2020-04-29 LAB — COMPREHENSIVE METABOLIC PANEL
ALT: 19 U/L (ref 0–44)
AST: 16 U/L (ref 15–41)
Albumin: 2.6 g/dL — ABNORMAL LOW (ref 3.5–5.0)
Alkaline Phosphatase: 73 U/L (ref 38–126)
Anion gap: 9 (ref 5–15)
BUN: 14 mg/dL (ref 8–23)
CO2: 26 mmol/L (ref 22–32)
Calcium: 10.7 mg/dL — ABNORMAL HIGH (ref 8.9–10.3)
Chloride: 108 mmol/L (ref 98–111)
Creatinine, Ser: 0.98 mg/dL (ref 0.44–1.00)
GFR, Estimated: >60 mL/min (ref >60)
Glucose, Bld: 84 mg/dL (ref 70–99)
Potassium: 4.1 mmol/L (ref 3.5–5.1)
Sodium: 143 mmol/L (ref 135–145)
Total Bilirubin: 1.3 mg/dL — ABNORMAL HIGH (ref 0.3–1.2)
Total Protein: 5.4 g/dL — ABNORMAL LOW (ref 6.5–8.1)

## 2020-04-29 LAB — ECHOCARDIOGRAM COMPLETE
Area-P 1/2: 3.76 cm2
Height: 63 in
S' Lateral: 1.7 cm
Weight: 3389.79 oz

## 2020-04-29 LAB — HEPARIN LEVEL (UNFRACTIONATED)
Heparin Unfractionated: <0.1 IU/mL — ABNORMAL LOW (ref 0.30–0.70)
Heparin Unfractionated: 0.23 IU/mL — ABNORMAL LOW (ref 0.30–0.70)
Heparin Unfractionated: 0.3 IU/mL (ref 0.30–0.70)

## 2020-04-29 LAB — BRAIN NATRIURETIC PEPTIDE: B Natriuretic Peptide: 545.8 pg/mL — ABNORMAL HIGH (ref 0.0–100.0)

## 2020-04-29 LAB — D-DIMER, QUANTITATIVE: D-Dimer, Quant: 16.04 ug/mL-FEU — ABNORMAL HIGH (ref 0.00–0.50)

## 2020-04-29 LAB — TROPONIN I (HIGH SENSITIVITY): Troponin I (High Sensitivity): 105 ng/L (ref <18)

## 2020-04-29 LAB — MAGNESIUM: Magnesium: 1.9 mg/dL (ref 1.7–2.4)

## 2020-04-29 LAB — PROCALCITONIN: Procalcitonin: <0.1 ng/mL

## 2020-04-29 MED ORDER — HEPARIN BOLUS VIA INFUSION
3000.0000 [IU] | Freq: Once | INTRAVENOUS | Status: AC
  Administered 2020-04-29: 3000 [IU] via INTRAVENOUS
  Filled 2020-04-29: qty 3000

## 2020-04-29 MED ORDER — POLYVINYL ALCOHOL 1.4 % OP SOLN
1.0000 [drp] | OPHTHALMIC | Status: DC | PRN
  Administered 2020-04-29 – 2020-05-02 (×3): 1 [drp] via OPHTHALMIC
  Filled 2020-04-29: qty 15

## 2020-04-29 MED ORDER — SALINE SPRAY 0.65 % NA SOLN
1.0000 | NASAL | Status: DC | PRN
  Filled 2020-04-29: qty 44

## 2020-04-29 MED ORDER — FUROSEMIDE 10 MG/ML IJ SOLN
40.0000 mg | Freq: Once | INTRAMUSCULAR | Status: AC
  Administered 2020-04-29: 40 mg via INTRAVENOUS
  Filled 2020-04-29: qty 4

## 2020-04-29 MED ORDER — METOPROLOL TARTRATE 25 MG PO TABS
25.0000 mg | ORAL_TABLET | Freq: Two times a day (BID) | ORAL | Status: DC
  Administered 2020-04-29 – 2020-05-07 (×18): 25 mg via ORAL
  Filled 2020-04-29 (×18): qty 1

## 2020-04-29 MED ORDER — TECHNETIUM TO 99M ALBUMIN AGGREGATED
4.4000 | Freq: Once | INTRAVENOUS | Status: AC | PRN
  Administered 2020-04-29: 4.4 via INTRAVENOUS

## 2020-04-29 MED ORDER — POTASSIUM CHLORIDE CRYS ER 20 MEQ PO TBCR
40.0000 meq | EXTENDED_RELEASE_TABLET | Freq: Once | ORAL | Status: AC
  Administered 2020-04-29: 40 meq via ORAL
  Filled 2020-04-29: qty 2

## 2020-04-29 NOTE — Progress Notes (Addendum)
PROGRESS NOTE                                                                                                                                                                                                             Patient Demographics:    Amanda Ross, is a 74 y.o. female, DOB - 21-Jan-1946, DTO:[id number]  Outpatient Primary MD for the patient is [name], MD    LOS - 1  Admit date - 04/26/2020    Chief Complaint  Patient presents with  . Shortness of Breath       Brief Narrative (HPI from H&P)  -  Amanda Ross  is a 74 y.o. female, with recent treated Covid 19 PNA > 3 weeks ago, Sjogren's syndrome, hypertension, asthma, left thyroid nodule, who was discharged about 3-1/2 weeks ago after treatment of COVID-19 pneumonia presents to the ER with gradually progressive shortness of breath which started about a week after she went home, she has also noticed some swelling in her left lower extremity, also noticed a change that she has slight difficulty in laying flat at night and now using 2 pillows.  In the ER she was diagnosed with acute hypoxic respiratory failure, extremely elevated D-dimer, acute left lower extremity DVT, due to allergy to IV dye CT scan could not be done.  She also had an elevated BNP, she was diagnosed with left lower extremity DVT and admitted for further work-up for acute hypoxic respiratory failure.   Subjective:    Amanda Ross today has, No headache, No chest pain, No abdominal pain - No Nausea, No new weakness tingling or numbness, improved SOB.   Assessment  & Plan :     1. Acute Hypoxic Resp. Failure in a patient with recently treated COVID-19, definitely has element of acute on chronic diastolic CHF with EF of 98% on recent echocardiogram along with possible PE due to new acute DVT and some bronchitis -   He is currently being treated with IV Lasix for diuresis, heparin drip for  DVT and oral Levaquin for 4 days for bronchitis.  Much improved, VQ scan is pending, she is not willing to go CTA due to dye allergy even with premedication, repeat echocardiogram pending.  Sputum Gram stain culture pending.  Encouraged the patient to sit up in chair in the daytime use I-S and  flutter valve for pulmonary toiletry and then prone in bed when at night.  Will advance activity and titrate down oxygen as possible.  SpO2: 93 % O2 Flow Rate (L/min): 15 L/min  Recent Labs  Lab 05/17/2020 1536 05/16/2020 1712 04/21/2020 1920 04/29/20 0205  WBC 9.1  --   --  8.0  HGB 12.3  --   --  11.2*  HCT 37.9  --   --  35.1*  PLT 155  --   --  157  CRP  --   --  10.9* 12.0*  BNP 592.1*  --   --  545.8*  DDIMER >20.00*  --   --  16.04*  PROCALCITON  --   --  0.13 <0.10  AST 19  --   --  16  ALT 24  --   --  19  ALKPHOS 86  --   --  73  BILITOT 1.0  --   --  1.3*  ALBUMIN 3.0*  --   --  2.6*  INR 1.2  --   --   --   LATICACIDVEN  --  1.3 1.8  --    Results for Amanda Ross (MRN [id number]) as of 04/29/2020 13:43  Ref. Range 05/17/2020 15:36 05/07/2020 19:20 04/29/2020 08:26  Troponin I (High Sensitivity) Latest Ref Range: <18 ng/L 87 (H) 103 (HH) 105 (HH)     2.  Productive cough could be possible CAP versus bronchitis.  Acute infection cannot be ruled out, check sputum Gram stain culture 4 days of Levaquin.  3.  GERD.  Continue PPI.  4. Elevated Troponin.  Likely due to demand ischemia from #1 above, trend is not an ACS pattern,  Check echocardiogram to evaluate EF and wall motion and monitor.  On Lopressor and Hep gtt,  nonacute EKG and no chest pain.  5.  Morbid obesity.  BMI 37.  Follow with PCP for weight loss.  6.  Sjogren's syndrome.  Continue Plaquenil and artificial tears.  7.  OSA.  Oxygen at night.  8.  Dyslipidemia.  Continue home dose statin.  9.  Essential hypertension.  add low dose lopressor.    Condition -   Guarded  Family Communication  :  Husband [name] 04/29/2020 on [phone].  Code Status :  Full  Consults  :  None  Procedures  :    VQ  TTE  PUD Prophylaxis : Pepcid  Disposition Plan  :    Status is: Inpatient  Remains inpatient appropriate because:IV treatments appropriate due to intensity of illness or inability to take PO   Dispo: The patient is from: Home              Anticipated d/c is to: Home              Anticipated d/c date is: > 3 days              Patient currently is not medically stable to d/c.  DVT Prophylaxis  :   Heparin  gtt  Lab Results  Component Value Date   PLT 157 04/29/2020    Diet :  Diet Order            DIET SOFT Room service appropriate? Yes; Fluid consistency: Thin  Diet effective now                  Inpatient Medications  Scheduled Meds: . (feeding supplement) PROSource Plus  30 mL Oral BID BM  . atorvastatin  10 mg Oral Daily  . cholecalciferol  2,000 Units Oral Daily  . furosemide  40 mg Intravenous Once  . hydroxychloroquine  200 mg Oral q morning - 10a  . levofloxacin  750 mg Oral Daily  . magnesium gluconate  500 mg Oral Daily  . oxybutynin  10 mg Oral QHS  . [START ON 05-11-20] pantoprazole  80 mg Oral Daily  . sodium chloride flush  3 mL Intravenous Q12H  . traZODone  50 mg Oral QHS   Continuous Infusions: . sodium chloride    . heparin 800 Units/hr (04/29/20 0402)  . vancomycin 750 mg (04/29/20 0554)   PRN Meds:.sodium chloride, acetaminophen **OR** acetaminophen, albuterol, baclofen, bisacodyl, ondansetron **OR** ondansetron (ZOFRAN) IV, polyvinyl alcohol, sodium chloride flush  Antibiotics  :    Anti-infectives (From admission, onward)   Start     Dose/Rate Route Frequency Ordered Stop   04/29/20 1000  hydroxychloroquine (PLAQUENIL) tablet 200 mg        200 mg Oral  Every morning - 10a 05/12/2020 1723     04/29/20 0600  vancomycin (VANCOREADY) IVPB 750 mg/150 mL        750 mg 150 mL/hr over 60 Minutes Intravenous Every 12 hours  05/12/2020 1728     04/30/2020 1745  levofloxacin (LEVAQUIN) tablet 750 mg        750 mg Oral Daily 04/30/2020 1738 05/02/20 0959   04/23/2020 1715  ceFEPIme (MAXIPIME) 2 g in sodium chloride 0.9 % 100 mL IVPB        2 g 200 mL/hr over 30 Minutes Intravenous  Once 04/25/2020 1712 05/16/2020 1825   05/01/2020 1715  vancomycin (VANCOREADY) IVPB 2000 mg/400 mL        2,000 mg 200 mL/hr over 120 Minutes Intravenous  Once 05/15/2020 1714 05/09/2020 2054       Time Spent in minutes  30   Lala Lund M.D on 04/29/2020 at 8:40 AM  To page go to [email or web address] - password [facility]  [facility] -  Office  [phone]    See all Orders from today for further details    Objective:   Vitals:   05/05/2020 1911 05/15/2020 2124 04/29/20 0000 04/29/20 0444  BP: (!) 129/115 121/75 120/71 118/64  Pulse: (!) 105 100 99 (!) 102  Resp: (!) 28 (!) 27 (!) 28 (!) 22  Temp: 98.8 F (37.1 C) 98.8 F (37.1 C) 99 F (37.2 C) 99.3 F (37.4 C)  TempSrc: Axillary  Oral Oral  SpO2: 97% 97% 95% 93%  Weight:      Height:        Wt Readings from Last 3 Encounters:  04/22/2020 96.1 kg  04/26/20 96.1 kg  04/11/20 96.1 kg     Intake/Output Summary (Last 24 hours) at 04/29/2020 0840 Last data filed at 04/29/2020 0554 Gross per 24 hour  Intake 109.95 ml  Output 900 ml  Net -790.05 ml     Physical Exam  Awake Alert, No new F.N deficits, Normal affect [place].AT,PERRAL Supple Neck,No JVD, No cervical lymphadenopathy appriciated.  Symmetrical Chest wall movement, Good air movement bilaterally, few rales RRR,No Gallops,Rubs or new Murmurs, No Parasternal Heave +ve B.Sounds, Abd Soft, No tenderness, No organomegaly appriciated, No rebound - guarding or rigidity. No Cyanosis, left leg is swollen.    Data Review:    CBC Recent Labs  Lab 04/24/2020 1536 04/29/20 0205  WBC 9.1 8.0  HGB 12.3 11.2*  HCT 37.9 35.1*  PLT 155 157  MCV 97.7 96.4  MCH 31.7 30.8  MCHC 32.5 31.9  RDW 19.9* 19.2*  LYMPHSABS 0.9 1.5    MONOABS 1.0 1.0  EOSABS 0.2 0.3  BASOSABS 0.1 0.0    Recent Labs  Lab 04/24/2020 1536 04/27/2020 1712 04/20/2020 1920 04/29/20 0205  NA 141  --   --  143  K 4.3  --   --  4.1  CL 111  --   --  108  CO2 21*  --   --  26  GLUCOSE 116*  --   --  84  BUN 16  --   --  14  CREATININE 1.03*  --   --  0.98  CALCIUM 10.8*  --   --  10.7*  AST 19  --   --  16  ALT 24  --   --  19  ALKPHOS 86  --   --  73  BILITOT 1.0  --   --  1.3*  ALBUMIN 3.0*  --   --  2.6*  MG  --   --   --  1.9  CRP  --   --  10.9* 12.0*  DDIMER >20.00*  --   --  16.04*  PROCALCITON  --   --  0.13 <0.10  LATICACIDVEN  --  1.3 1.8  --   INR 1.2  --   --   --   BNP 592.1*  --   --  545.8*    ------------------------------------------------------------------------------------------------------------------ No results for input(s): CHOL, HDL, LDLCALC, TRIG, CHOLHDL, LDLDIRECT in the last 72 hours.  Lab Results  Component Value Date   HGBA1C 5.5 02/19/2018   ------------------------------------------------------------------------------------------------------------------ No results for input(s): TSH, T4TOTAL, T3FREE, THYROIDAB in the last 72 hours.  Invalid input(s): FREET3  Cardiac Enzymes No results for input(s): CKMB, TROPONINI, MYOGLOBIN in the last 168 hours.  Invalid input(s): CK ------------------------------------------------------------------------------------------------------------------    Component Value Date/Time   BNP 545.8 (H) 04/29/2020 0205    Micro Results Recent Results (from the past 240 hour(s))  Culture, blood (routine x 2)     Status: None (Preliminary result)   Collection Time: 05/07/2020  5:12 PM   Specimen: BLOOD  Result Value Ref Range Status   Specimen Description BLOOD BLOOD LEFT WRIST  Final   Special Requests   Final    BOTTLES DRAWN AEROBIC AND ANAEROBIC Blood Culture results may not be optimal due to an inadequate volume of blood received in culture bottles   Culture    Final    NO GROWTH < 24 HOURS Performed at [facility], [place] [address] [place], [place] [address]    Report Status PENDING  Incomplete  Culture, blood (routine x 2)     Status: None (Preliminary result)   Collection Time: 04/29/2020  7:20 PM   Specimen: BLOOD  Result Value Ref Range Status   Specimen Description BLOOD RIGHT ANTECUBITAL  Final   Special Requests   Final    BOTTLES DRAWN AEROBIC ONLY Blood Culture results may not be optimal due to an inadequate volume of blood received in culture bottles   Culture   Final    NO GROWTH < 12 HOURS Performed at [facility], [place] [address] [place], [place] [address]    Report Status PENDING  Incomplete    Radiology Reports CARDIAC CATHETERIZATION  Result Date: 04/15/2020 Medical treatment for possible microvascular disease or demand ischemia. Low dose aspirin as tolerated.  DG Chest Port 1 View  Result Date: 04/27/2020 CLINICAL DATA:  Shortness of breath EXAM: PORTABLE CHEST 1 VIEW COMPARISON:  04/03/2020, 03/31/2020 CT 03/25/2020 FINDINGS: Significant interval worsening right greater than left pulmonary airspace opacities concerning for bilateral pneumonia. Enlarged cardiomediastinal silhouette with aortic atherosclerosis. No significant effusion. No pneumothorax. IMPRESSION: Significant interval worsening of right greater than left pulmonary airspace opacities concerning for bilateral pneumonia. Electronically Signed   By: Donavan Foil M.D.   On: 04/22/2020 17:03   DG Chest Port 1 View  Result Date: 04/03/2020 CLINICAL DATA:  Shortness of breath. EXAM: PORTABLE CHEST 1 VIEW COMPARISON:  03/31/2020 chest radiograph and prior. FINDINGS: Bilateral mid to lower lung patchy opacities are more conspicuous than prior exam. No pneumothorax or pleural effusion. Mild cardiomegaly and aortic arch atherosclerotic calcifications. Osseous structures are unchanged. IMPRESSION: Slightly increased conspicuity of bilateral pulmonary  opacities concerning for multifocal pneumonia. Electronically Signed   By: Primitivo Gauze M.D.   On: 04/03/2020 08:24   DG Chest Port 1 View  Result Date: 03/31/2020 CLINICAL DATA:  Shortness of breath. EXAM: PORTABLE CHEST 1 VIEW COMPARISON:  03/28/2020 FINDINGS: Mild cardiomegaly stable. Aortic atherosclerotic calcification noted. Pulmonary infiltrate is again seen at the right base, with mild worsening since previous study. There is also mild airspace disease in the peripheral left mid lung which is unchanged in appearance. No definite pleural effusion or pneumothorax. IMPRESSION: Mild worsening of right basilar pulmonary infiltrate. Stable mild airspace disease in peripheral left mid lung. Electronically Signed   By: Marlaine Hind M.D.   On: 03/31/2020 08:07   VAS [place] LOWER EXTREMITY VENOUS (DVT) (ONLY [facility] & [facility])  Result Date: 04/27/2020  Lower Venous DVT Study Indications: Recent COVID infection 03/2020, now with D-dimer >20 and hypoxia.  Limitations: Poor ultrasound/tissue interface. Comparison Study: 03/26/2020 negative lower extremity venous duplex Performing Technologist: Maudry Mayhew MHA, RDMS, RVT, RDCS  Examination Guidelines: A complete evaluation includes B-mode imaging, spectral Doppler, color Doppler, and power Doppler as needed of all accessible portions of each vessel. Bilateral testing is considered an integral part of a complete examination. Limited examinations for reoccurring indications may be performed as noted. The reflux portion of the exam is performed with the patient in reverse Trendelenburg.  +---------+---------------+---------+-----------+----------+--------------+ RIGHT    CompressibilityPhasicitySpontaneityPropertiesThrombus Aging +---------+---------------+---------+-----------+----------+--------------+ CFV      Full           No       Yes                                 +---------+---------------+---------+-----------+----------+--------------+ SFJ       Full                                                        +---------+---------------+---------+-----------+----------+--------------+ FV Prox  Full                                                        +---------+---------------+---------+-----------+----------+--------------+ FV Mid   Full                                                        +---------+---------------+---------+-----------+----------+--------------+  FV DistalFull                                                        +---------+---------------+---------+-----------+----------+--------------+ PFV      Full                                                        +---------+---------------+---------+-----------+----------+--------------+ POP      Full           No       Yes                                 +---------+---------------+---------+-----------+----------+--------------+ PTV      Full                                                        +---------+---------------+---------+-----------+----------+--------------+ PERO     Full                                                        +---------+---------------+---------+-----------+----------+--------------+   +---------+---------------+---------+-----------+----------+--------------+ LEFT     CompressibilityPhasicitySpontaneityPropertiesThrombus Aging +---------+---------------+---------+-----------+----------+--------------+ CFV      Full           No       Yes                                 +---------+---------------+---------+-----------+----------+--------------+ SFJ      Full                                                        +---------+---------------+---------+-----------+----------+--------------+ FV Prox  None                                         Acute          +---------+---------------+---------+-----------+----------+--------------+ FV Mid   None                                          Acute          +---------+---------------+---------+-----------+----------+--------------+ FV DistalNone                                         Acute          +---------+---------------+---------+-----------+----------+--------------+  PFV      Full                                                        +---------+---------------+---------+-----------+----------+--------------+ POP      None                    No                   Acute          +---------+---------------+---------+-----------+----------+--------------+ PTV      None                                         Acute          +---------+---------------+---------+-----------+----------+--------------+   Left Technical Findings: Not visualized segments include peroneal veins not adequately visualized.   Summary: RIGHT: - There is no evidence of deep vein thrombosis in the lower extremity.  - No cystic structure found in the popliteal fossa.  LEFT: - Findings consistent with acute deep vein thrombosis involving the left femoral vein, left popliteal vein, and left posterior tibial veins. - No cystic structure found in the popliteal fossa.  Lower extremity venous flow is pulsatile, suggestive of possibly elevated right heart pressure.  *See table(s) above for measurements and observations.    Preliminary    ECHOCARDIOGRAM LIMITED  Result Date: 04/06/2020    ECHOCARDIOGRAM LIMITED REPORT   Patient Name:   MEGGIN OLA Date of Exam: 04/06/2020 Medical Rec #:  [id number]         Height:       63.0 in Accession #:    [id number]        Weight:       225.0 lb Date of Birth:  Jul 21, 1945         BSA:          2.033 m Patient Age:    30 years          BP:           111/75 mmHg Patient Gender: F                 HR:           96 bpm. Exam Location:  Inpatient Procedure: Limited Echo, Cardiac Doppler, Color Doppler and Intracardiac            Opacification Agent Indications:    Dyspnea  History:        Patient has no prior  history of Echocardiogram examinations.                 Risk Factors:Dyslipidemia and Former Smoker. COVID-19.  Sonographer:    Clayton Lefort RDCS (AE) Referring Phys: [id number] Graciela Husbands  Sonographer Comments: Technically challenging study due to limited acoustic windows, Technically difficult study due to poor echo windows, suboptimal parasternal window, suboptimal apical window, suboptimal subcostal window and patient is morbidly obese.  COVID-19. IMPRESSIONS  1. Left ventricular ejection fraction, by estimation, is 60 to 65%. The left ventricle has normal function.  2. Right ventricular systolic function was not well visualized. The right ventricular size is moderately enlarged.  3. The mitral valve was not well visualized.  4. The aortic valve was not well visualized.  5. The inferior vena cava is normal in size with <50% respiratory variability, suggesting right atrial pressure of 8 mmHg. Comparison(s): No prior Echocardiogram. Conclusion(s)/Recommendation(s): Technically challenging study with limited windows. Even with use of echo contrast, able to assess normal LVEF but limited for wall motion analysis. No significant valve disease by doppler, but not well visualized. FINDINGS  Left Ventricle: Left ventricular ejection fraction, by estimation, is 60 to 65%. The left ventricle has normal function. Definity contrast agent was given IV to delineate the left ventricular endocardial borders. Right Ventricle: The right ventricular size is moderately enlarged. Right vetricular wall thickness was not well visualized. Right ventricular systolic function was not well visualized. Left Atrium: Left atrial size was not well visualized. Right Atrium: Right atrial size was not well visualized. Pericardium: The pericardium was not well visualized. Mitral Valve: The mitral valve was not well visualized. Tricuspid Valve: The tricuspid valve is not well visualized. Aortic Valve: The aortic valve was not well visualized.  Pulmonic Valve: The pulmonic valve was not well visualized. Aorta: The aortic root was not well visualized, the ascending aorta was not well visualized and the aortic arch was not well visualized. Venous: The inferior vena cava is normal in size with less than 50% respiratory variability, suggesting right atrial pressure of 8 mmHg. LEFT VENTRICLE PLAX 2D LVOT diam:     2.10 cm  Diastology LVOT Area:     3.46 cm LV e' medial:    3.37 cm/s                         LV E/e' medial:  11.1                         LV e' lateral:   4.46 cm/s                         LV E/e' lateral: 8.4  IVC IVC diam: 1.70 cm  AORTA Ao Root diam: 3.30 cm Ao Asc diam:  3.30 cm MITRAL VALVE MV Area (PHT): 3.68 cm    SHUNTS MV Decel Time: 206 msec    Systemic Diam: 2.10 cm MV E velocity: 37.30 cm/s MV A velocity: 72.40 cm/s MV E/A ratio:  0.52 Buford Dresser MD Electronically signed by Buford Dresser MD Signature Date/Time: 04/06/2020/2:33:32 PM    Final

## 2020-04-29 NOTE — Progress Notes (Signed)
ANTICOAGULATION CONSULT NOTE - Follow Up Consult  Pharmacy Consult for heparin Indication: DVT +/- PE  Labs: Recent Labs    04/27/2020 1536 04/18/2020 1920 04/29/20 0205 04/29/20 0826 04/29/20 1054 04/29/20 2044  HGB 12.3  --  11.2*  --   --   --   HCT 37.9  --  35.1*  --   --   --   PLT 155  --  157  --   --   --   LABPROT 14.3  --   --   --   --   --   INR 1.2  --   --   --   --   --   HEPARINUNFRC  --   --  <0.10*  --  0.23* 0.30  CREATININE 1.03*  --  0.98  --   --   --   TROPONINIHS 87* 103*  --  105*  --   --     Assessment: 74yo female subtherapeutic on heparin with initial dosing for +VTE, Heparin trending up   PM f/u > heparin level at lowest end of therapeutic range.  No overt bleeding or complications noted.  Goal of Therapy:  Heparin level 0.3-0.7 units/ml   Plan:  Increase heparin to 1250 units / hr Daily heparin level and CBC.   [place] Crane, Roylene Reason, BCCP Clinical Pharmacist  04/29/2020 9:41 PM   [facility] pharmacy phone numbers are listed on [email or web address]

## 2020-04-29 NOTE — Progress Notes (Signed)
Echocardiogram 2D Echocardiogram has been performed.  Amanda Ross 04/29/2020, 3:52 PM

## 2020-04-29 NOTE — Progress Notes (Signed)
Nuclear medicine called to notify me that Pt desaturated to 70's% O2 during procedure.  RN sent down to assess Pt.  Pt had accidentally removed high flow nasal canula.  High flow reestablished and Pt' O2 returned to 92-94%.

## 2020-04-29 NOTE — Evaluation (Signed)
Physical Therapy Evaluation Patient Details Name: Amanda Ross MRN: [id number] DOB: 1946/04/05 Today's Date: 04/29/2020   History of Present Illness  Amanda Ross  is a 74 y.o. female, with recent treated Covid 19 PNA > 3 weeks ago, Sjogren's syndrome, hypertension, asthma, left thyroid nodule, who was discharged about 3-1/2 weeks ago after treatment of COVID-19 pneumonia presents to the ER with gradually progressive shortness of breath which started about a week after she went home,  Clinical Impression  Patient received in bed, O2 sats on HFNC in low 90%s at rest. She is mod independent with bed mobility. Increased time and use of bed rails needed. Upon sitting edge of bed sats dropped into low 80%s. Patient encouraged to take her time, perform deep breathing. She is slightly anxious with mobility. Patient is able to stand from bed with min guard and RW. She is able to ambulate 3 feet to recliner. O2 sats upon getting up to chair in high 70%s. She requires increased time to return to 90% once seated with pursed lip breathing. HR up to 130s with mobility. She will continue to benefit from skilled PT while here to improve functional mobility, activity tolerance and safety.    Follow Up Recommendations Home health PT;Supervision/Assistance - 24 hour    Equipment Recommendations  None recommended by PT    Recommendations for Other Services       Precautions / Restrictions Precautions Precaution Comments: mod fall Restrictions Weight Bearing Restrictions: No      Mobility  Bed Mobility Overal bed mobility: Modified Independent Bed Mobility: Supine to Sit     Supine to sit: Modified independent (Device/Increase time);HOB elevated     General bed mobility comments: mod independent, HOB elevated, use of bed rails    Transfers Overall transfer level: Needs assistance Equipment used: Rolling walker (2 wheeled) Transfers: Sit to/from [facility] Sit to Stand:  Min guard Stand pivot transfers: Min guard       General transfer comment: Assist for safety/lines  Ambulation/Gait Ambulation/Gait assistance: Min guard Gait Distance (Feet): 3 Feet Assistive device: Rolling walker (2 wheeled) Gait Pattern/deviations: Step-to pattern;Decreased step length - right;Decreased step length - left;Decreased stride length Gait velocity: decr   General Gait Details: Assist to amb short distance from bed to chair.   Stairs            Wheelchair Mobility    Modified Rankin (Stroke Patients Only)       Balance Overall balance assessment: Modified Independent Sitting-balance support: Feet supported Sitting balance-Leahy Scale: Good     Standing balance support: Bilateral upper extremity supported;During functional activity Standing balance-Leahy Scale: Fair Standing balance comment: UE support and min guard for static standing                             Pertinent Vitals/Pain Pain Assessment: No/denies pain    Home Living Family/patient expects to be discharged to:: Private residence Living Arrangements: Spouse/significant other Available Help at Discharge: Family Type of Home: House Home Access: Stairs to enter Entrance Stairs-Rails: Psychiatric nurse of Steps: 3 Home Layout: One level Home Equipment: Environmental consultant - 2 wheels;Shower seat;Bedside commode;Cane - quad      Prior Function Level of Independence: Independent with assistive device(s)         Comments: since recent discharge she has been using walker and went home on O2     Hand Dominance   Dominant Hand: Right  Extremity/Trunk Assessment   Upper Extremity Assessment Upper Extremity Assessment: Overall WFL for tasks assessed    Lower Extremity Assessment Lower Extremity Assessment: Overall WFL for tasks assessed    Cervical / Trunk Assessment Cervical / Trunk Assessment: Normal  Communication   Communication: No difficulties   Cognition Arousal/Alertness: Awake/alert Behavior During Therapy: WFL for tasks assessed/performed Overall Cognitive Status: Within Functional Limits for tasks assessed                                 General Comments: Very pleasant.  Anxious with mobility. Requires cues for pursed lip breathing/deep breathing/relaxation to bring O2 up and HR down      General Comments      Exercises     Assessment/Plan    PT Assessment Patient needs continued PT services  PT Problem List Decreased strength;Decreased mobility;Decreased activity tolerance;Cardiopulmonary status limiting activity       PT Treatment Interventions DME instruction;Therapeutic activities;Gait training;Therapeutic exercise;Patient/family education;Balance training;Functional mobility training;Stair training    PT Goals (Current goals can be found in the Care Plan section)  Acute Rehab PT Goals Patient Stated Goal: improve breathing, return home PT Goal Formulation: With patient/family Time For Goal Achievement: 05/13/20 Potential to Achieve Goals: Fair    Frequency Min 3X/week   Barriers to discharge        Co-evaluation               AM-PAC PT "6 Clicks" Mobility  Outcome Measure Help needed turning from your back to your side while in a flat bed without using bedrails?: A Little Help needed moving from lying on your back to sitting on the side of a flat bed without using bedrails?: A Little Help needed moving to and from a bed to a chair (including a wheelchair)?: A Little Help needed standing up from a chair using your arms (e.g., wheelchair or bedside chair)?: A Little Help needed to walk in hospital room?: A Lot Help needed climbing 3-5 steps with a railing? : Total 6 Click Score: 15    End of Session Equipment Utilized During Treatment: Oxygen Activity Tolerance: Treatment limited secondary to medical complications (Comment) (HR increased to 140, O2 dropped to low 80% upper  70%) Patient left: in chair;with call bell/phone within reach;with family/visitor present Nurse Communication: Mobility status PT Visit Diagnosis: Muscle weakness (generalized) (M62.81);Difficulty in walking, not elsewhere classified (R26.2)    Time: 8938-1017 PT Time Calculation (min) (ACUTE ONLY): 23 min   Charges:   PT Evaluation $PT Eval Moderate Complexity: 1 Mod PT Treatments $Therapeutic Activity: 8-22 mins        Keysean Savino, PT, GCS 04/29/20,12:17 PM

## 2020-04-29 NOTE — Progress Notes (Signed)
ANTICOAGULATION CONSULT NOTE - Follow Up Consult  Pharmacy Consult for heparin Indication: DVT +/- PE  Labs: Recent Labs    04/18/2020 1536 04/19/2020 1920 04/29/20 0205  HGB 12.3  --  11.2*  HCT 37.9  --  35.1*  PLT 155  --  157  LABPROT 14.3  --   --   INR 1.2  --   --   HEPARINUNFRC  --   --  <0.10*  CREATININE 1.03*  --   --   TROPONINIHS 87* 103*  --     Assessment: 74yo female subtherapeutic on heparin with initial dosing for +VTE, was therapeutic at this low rate on recent admission; no gtt issues or signs of bleeding per RN.  Goal of Therapy:  Heparin level 0.3-0.7 units/ml   Plan:  Will rebolus with heparin 3000 units and increase heparin gtt by 4 units/kgABW/hr to 800 units/hr and check level in 6-8 hours.    Wynona Neat, PharmD, BCPS  04/29/2020,3:59 AM

## 2020-04-29 NOTE — Chronic Care Management (AMB) (Signed)
°  Chronic Care Management   Inpatient Admit Review Note  04/29/2020 Name: Amanda Ross MRN: [id number] DOB: 1945-12-05  Amanda Ross is a 74 y.o. year old female who is a primary care patient of [name], MD. Amanda Ross is actively engaged with the embedded care management team in the primary care practice and is being followed by RN Case Manager and BSW for assistance with disease management and care coordination needs related to CKD Stage III and Hypertensive Nephropathy.   Amanda Ross is currently admitted to the hospital for evaluation and treatment of acute hypoxemic respiratory failure.   Plan: CM team will collaborate with [facility] and will follow patient post discharge.    Daneen Schick, BSW, CDP Social Worker, Certified Dementia Practitioner [facility] [phone]

## 2020-04-29 NOTE — Progress Notes (Signed)
OT Cancellation Note  Patient Details Name: Amanda Ross MRN: [id number] DOB: April 08, 1946   Cancelled Treatment:    Reason Eval/Treat Not Completed: Patient at procedure or test/ unavailable.  Continue efforts as appropriate.   Eliz Nigg D Indalecio Malmstrom 04/29/2020, 3:42 PM

## 2020-04-29 NOTE — Progress Notes (Signed)
ANTICOAGULATION CONSULT NOTE - Follow Up Consult  Pharmacy Consult for heparin Indication: DVT +/- PE  Labs: Recent Labs    04/29/2020 1536 05/09/2020 1920 04/29/20 0205 04/29/20 0826 04/29/20 1054  HGB 12.3  --  11.2*  --   --   HCT 37.9  --  35.1*  --   --   PLT 155  --  157  --   --   LABPROT 14.3  --   --   --   --   INR 1.2  --   --   --   --   HEPARINUNFRC  --   --  <0.10*  --  0.23*  CREATININE 1.03*  --  0.98  --   --   TROPONINIHS 87* 103*  --  105*  --     Assessment: 74yo female subtherapeutic on heparin with initial dosing for +VTE, Heparin trending up   Goal of Therapy:  Heparin level 0.3-0.7 units/ml   Plan:  Increase heparin to 1100 units / hr Heparin level in 8 hours  Thank you Anette Guarneri, PharmD  04/29/2020,12:42 PM

## 2020-04-29 NOTE — Consult Note (Signed)
   [facility] Inpatient Consult   04/29/2020  MILAH RECHT June 04, 1946 [id number]   [facility] [place] [facility] [[facility]] Patient:  Patient was screened for [place] [facility] services. Patient will have the transition of care call conducted by the primary care provider. This patient is also in an W[facility] which has a chronic disease management Embedded Care Management team.  Plan: The [place] Management team is aware of hospitalization. Will follow up and update on needs as appropriate.   Please contact for further questions,  Natividad Brood, RN BSN [facility] Liaison  [phone] business mobile phone Toll free office [phone]  Fax number: [phone] [email or web address]

## 2020-04-30 ENCOUNTER — Inpatient Hospital Stay (HOSPITAL_COMMUNITY): Payer: Medicare Other

## 2020-04-30 DIAGNOSIS — J9601 Acute respiratory failure with hypoxia: Secondary | ICD-10-CM | POA: Diagnosis not present

## 2020-04-30 DIAGNOSIS — Z515 Encounter for palliative care: Secondary | ICD-10-CM

## 2020-04-30 DIAGNOSIS — Z7189 Other specified counseling: Secondary | ICD-10-CM | POA: Diagnosis not present

## 2020-04-30 DIAGNOSIS — Z66 Do not resuscitate: Secondary | ICD-10-CM | POA: Diagnosis not present

## 2020-04-30 LAB — MRSA PCR SCREENING: MRSA by PCR: NEGATIVE

## 2020-04-30 LAB — URINALYSIS, ROUTINE W REFLEX MICROSCOPIC
Bacteria, UA: NONE SEEN
Bilirubin Urine: NEGATIVE
Glucose, UA: NEGATIVE mg/dL
Hgb urine dipstick: NEGATIVE
Ketones, ur: 20 mg/dL — AB
Leukocytes,Ua: NEGATIVE
Nitrite: NEGATIVE
Protein, ur: 30 mg/dL — AB
Specific Gravity, Urine: 1.025 (ref 1.005–1.030)
pH: 5 (ref 5.0–8.0)

## 2020-04-30 LAB — COMPREHENSIVE METABOLIC PANEL
ALT: 18 U/L (ref 0–44)
AST: 15 U/L (ref 15–41)
Albumin: 2.4 g/dL — ABNORMAL LOW (ref 3.5–5.0)
Alkaline Phosphatase: 91 U/L (ref 38–126)
Anion gap: 9 (ref 5–15)
BUN: 19 mg/dL (ref 8–23)
CO2: 27 mmol/L (ref 22–32)
Calcium: 10.6 mg/dL — ABNORMAL HIGH (ref 8.9–10.3)
Chloride: 104 mmol/L (ref 98–111)
Creatinine, Ser: 1.14 mg/dL — ABNORMAL HIGH (ref 0.44–1.00)
GFR, Estimated: 51 mL/min — ABNORMAL LOW (ref >60)
Glucose, Bld: 102 mg/dL — ABNORMAL HIGH (ref 70–99)
Potassium: 4.3 mmol/L (ref 3.5–5.1)
Sodium: 140 mmol/L (ref 135–145)
Total Bilirubin: 1 mg/dL (ref 0.3–1.2)
Total Protein: 5.5 g/dL — ABNORMAL LOW (ref 6.5–8.1)

## 2020-04-30 LAB — CBC WITH DIFFERENTIAL/PLATELET
Abs Immature Granulocytes: 0.08 10*3/uL — ABNORMAL HIGH (ref 0.00–0.07)
Basophils Absolute: 0 10*3/uL (ref 0.0–0.1)
Basophils Relative: 0 %
Eosinophils Absolute: 0.2 10*3/uL (ref 0.0–0.5)
Eosinophils Relative: 2 %
HCT: 35.6 % — ABNORMAL LOW (ref 36.0–46.0)
Hemoglobin: 11.8 g/dL — ABNORMAL LOW (ref 12.0–15.0)
Immature Granulocytes: 1 %
Lymphocytes Relative: 11 %
Lymphs Abs: 1 10*3/uL (ref 0.7–4.0)
MCH: 31.6 pg (ref 26.0–34.0)
MCHC: 33.1 g/dL (ref 30.0–36.0)
MCV: 95.2 fL (ref 80.0–100.0)
Monocytes Absolute: 1.1 10*3/uL — ABNORMAL HIGH (ref 0.1–1.0)
Monocytes Relative: 13 %
Neutro Abs: 6.7 10*3/uL (ref 1.7–7.7)
Neutrophils Relative %: 73 %
Platelets: 207 10*3/uL (ref 150–400)
RBC: 3.74 MIL/uL — ABNORMAL LOW (ref 3.87–5.11)
RDW: 18.5 % — ABNORMAL HIGH (ref 11.5–15.5)
WBC: 9.1 10*3/uL (ref 4.0–10.5)
nRBC: 0 % (ref 0.0–0.2)

## 2020-04-30 LAB — MAGNESIUM: Magnesium: 1.6 mg/dL — ABNORMAL LOW (ref 1.7–2.4)

## 2020-04-30 LAB — C-REACTIVE PROTEIN: CRP: 23.6 mg/dL — ABNORMAL HIGH (ref <1.0)

## 2020-04-30 LAB — HEPARIN LEVEL (UNFRACTIONATED): Heparin Unfractionated: 0.54 IU/mL (ref 0.30–0.70)

## 2020-04-30 LAB — PROCALCITONIN: Procalcitonin: 0.34 ng/mL

## 2020-04-30 LAB — D-DIMER, QUANTITATIVE: D-Dimer, Quant: 4.16 ug/mL-FEU — ABNORMAL HIGH (ref 0.00–0.50)

## 2020-04-30 LAB — BRAIN NATRIURETIC PEPTIDE: B Natriuretic Peptide: 421.2 pg/mL — ABNORMAL HIGH (ref 0.0–100.0)

## 2020-04-30 MED ORDER — METHYLPREDNISOLONE SODIUM SUCC 125 MG IJ SOLR
60.0000 mg | Freq: Every day | INTRAMUSCULAR | Status: DC
  Administered 2020-04-30 – 2020-05-03 (×4): 60 mg via INTRAVENOUS
  Filled 2020-04-30 (×5): qty 2

## 2020-04-30 MED ORDER — ENOXAPARIN SODIUM 100 MG/ML ~~LOC~~ SOLN
100.0000 mg | Freq: Two times a day (BID) | SUBCUTANEOUS | Status: DC
  Administered 2020-04-30 – 2020-05-03 (×7): 100 mg via SUBCUTANEOUS
  Filled 2020-04-30 (×8): qty 1

## 2020-04-30 MED ORDER — MORPHINE SULFATE 10 MG/5ML PO SOLN: 2.5000 mg | ORAL | Status: DC | PRN

## 2020-04-30 MED ORDER — MAGNESIUM SULFATE 2 GM/50ML IV SOLN
2.0000 g | Freq: Once | INTRAVENOUS | Status: AC
  Administered 2020-04-30: 2 g via INTRAVENOUS
  Filled 2020-04-30: qty 50

## 2020-04-30 MED ORDER — FUROSEMIDE 10 MG/ML IJ SOLN
20.0000 mg | Freq: Once | INTRAMUSCULAR | Status: AC
  Administered 2020-04-30: 20 mg via INTRAVENOUS
  Filled 2020-04-30: qty 2

## 2020-04-30 MED ORDER — GUAIFENESIN-DM 100-10 MG/5ML PO SYRP
5.0000 mL | ORAL_SOLUTION | ORAL | Status: DC | PRN
  Administered 2020-04-30 – 2020-05-07 (×11): 5 mL via ORAL
  Filled 2020-04-30 (×11): qty 5

## 2020-04-30 MED ORDER — LACTATED RINGERS IV SOLN: INTRAVENOUS | Status: AC

## 2020-04-30 NOTE — Progress Notes (Addendum)
PROGRESS NOTE                                                                                                                                                                                                             Patient Demographics:    Amanda Ross, is a 74 y.o. female, DOB - 23-Jun-1945, LFY:[id number]  Outpatient Primary MD for the patient is [name], MD    LOS - 2  Admit date - 05/05/2020    Chief Complaint  Patient presents with  . Shortness of Breath       Brief Narrative (HPI from H&P)  -  Amanda Ross  is a 74 y.o. female, with recent treated Covid 19 PNA > 3 weeks ago, Sjogren's syndrome, hypertension, asthma, left thyroid nodule, who was discharged about 3-1/2 weeks ago after treatment of COVID-19 pneumonia presents to the ER with gradually progressive shortness of breath which started about a week after she went home, she has also noticed some swelling in her left lower extremity, also noticed a change that she has slight difficulty in laying flat at night and now using 2 pillows.  In the ER she was diagnosed with acute hypoxic respiratory failure, extremely elevated D-dimer, acute left lower extremity DVT, due to allergy to IV dye CT scan could not be done.  She also had an elevated BNP, she was diagnosed with left lower extremity DVT and admitted for further work-up for acute hypoxic respiratory failure.   Subjective:   Patient in bed appears to be in no distress, denies any headache, no chest or abdominal pain, continues to have a mild cough and shortness of breath.   Assessment  & Plan :     1. Acute Hypoxic Resp. Failure in a patient with recently treated for Severe COVID-19 pneumonia , definitely has element of acute on chronic diastolic CHF with EF of 85% on recent echocardiogram along with possible PE due to new acute DVT and some bronchitis -   She is currently being treated with IV Lasix  for diuresis as needed now clinically seems to be dry, initially on heparin drip now on full dose Levaquin for DVT, oral Levaquin for 4 days for suspected bronchitis. VQ scan low probability, pending sputum Gram stain culture and MRSA nasal PCR, blood cultures negative thus far, echocardiogram noted along with CT  chest noncontrast noted, she seems to have developed significant parenchymal lung injury due to her COVID-19 infection and that possibly is causing some pulmonary hypertension as well, will add IV Solu-Medrol to see if it helps in her hypoxia.   Her case was discussed with pulmonary Dr. Valeta Harms in detail including the CT scan, extremely poor prognosis, discussed with husband, unfortunately not good understanding of the disease, will involve palliative care as well..  Now will continue IV Lasix for diuresis at low doses as needed, oral Levaquin, IV Solu-Medrol, full dose Lovenox for DVT.  Obtain pulmonary input.  Encouraged the patient to sit up in chair in the daytime use I-S and flutter valve for pulmonary toiletry and then prone in bed when at night.  Will advance activity and titrate down oxygen as possible.  SpO2: 91 % O2 Flow Rate (L/min): 15 L/min  Recent Labs  Lab 05/07/2020 1536 05/05/2020 1712 04/18/2020 1920 04/29/20 0205 04/30/20 0313  WBC 9.1  --   --  8.0 9.1  HGB 12.3  --   --  11.2* 11.8*  HCT 37.9  --   --  35.1* 35.6*  PLT 155  --   --  157 207  CRP  --   --  10.9* 12.0* 23.6*  BNP 592.1*  --   --  545.8* 421.2*  DDIMER >20.00*  --   --  16.04* 4.16*  PROCALCITON  --   --  0.13 <0.10 0.34  AST 19  --   --  16 15  ALT 24  --   --  19 18  ALKPHOS 86  --   --  73 91  BILITOT 1.0  --   --  1.3* 1.0  ALBUMIN 3.0*  --   --  2.6* 2.4*  INR 1.2  --   --   --   --   LATICACIDVEN  --  1.3 1.8  --   --    Results for EMAN, RYNDERS (MRN [id number]) as of 04/29/2020 13:43  Ref. Range 04/25/2020 15:36 04/21/2020 19:20 04/29/2020 08:26  Troponin I (High Sensitivity) Latest  Ref Range: <18 ng/L 87 (H) 103 (HH) 105 (HH)      2.  Productive cough could be possible CAP versus bronchitis.  Acute infection cannot be ruled out, pending sputum Gram stain culture along with nasal MRSA PCR, continue 4 days of Levaquin.  3.  GERD.  Continue PPI.  4. Elevated Troponin.  Likely due to demand ischemia from #1 above, trend is not an ACS pattern, echocardiogram shows a preserved EF without any wall motion abnormalities.  On Lopressor and full dose anticoagulation for DVT,  nonacute EKG and no chest pain.  5.  Morbid obesity.  BMI 37.  Follow with PCP for weight loss.  6.  Sjogren's syndrome.  Continue Plaquenil and artificial tears.  7.  OSA.  Oxygen at night.  8.  Dyslipidemia.  Continue home dose statin.  9.  Essential hypertension.  added low dose lopressor.     Condition -   Guarded +++  Family Communication  : Husband Amanda Ross 04/29/2020 on [phone].  Updated again in detail on 04/30/2020 including poor prognosis  Daughter Amanda Ross (Daughter) [phone]  On 04/30/20    Code Status :  Full  Consults  :  PCCM, palliative care  Procedures  :    CT chest - 1. Interval development of severe bilateral pulmonary parenchymal disease compared to the relatively recent prior imaging dated 03/25/2020. There is  new significant bronchiectasis throughout the posterior aspect of the right upper lobe, the right lower lobe and within the periphery of the left upper lobe. The associated pulmonary parenchyma is nearly completely replaced with interstitial and confluent airspace opacities. Findings are most consistent with severe post COVID scarring and architectural distortion. 2. New emphysematous changes in the right upper lobe near the apex. 3. No evidence of pleural effusion or pneumothorax. 4. Increasing prominence of mediastinal lymph nodes are almost certainly reactive in nature. 5. Enlarged main pulmonary artery suggests developing pulmonary arterial  hypertension. 6. Coronary artery and aortic atherosclerotic calcifications. Aortic Atherosclerosis  Lower extremity venous duplex.  Positive for Acute DVT in the left leg.    VQ - Low probability  TTE -  1. Left ventricular ejection fraction, by estimation, is 60 to 65%. The left ventricle has normal function. The left ventricle has no regional wall motion abnormalities. There is mild left ventricular hypertrophy. Left ventricular diastolic parameters are consistent with Grade I diastolic dysfunction (impaired relaxation).  2. Right ventricular systolic function is moderately reduced. The right ventricular size is mildly enlarged. With RV free wall hypokinesis and preserved apical function, would consider PE. There is normal pulmonary artery systolic pressure. The estimated right ventricular systolic pressure is 29.5 mmHg.  3. The mitral valve is normal in structure. No evidence of mitral valve regurgitation. No evidence of mitral stenosis.  4. The aortic valve is tricuspid. Aortic valve regurgitation is not visualized. No aortic stenosis is present.  5. Aortic dilatation noted. There is mild dilatation of the ascending aorta, measuring 38 mm.  6. The inferior vena cava is normal in size with <50% respiratory variability, suggesting right atrial pressure of 8 mmHg.  7. 5 cm x 5 cm cystic structure in liver.    PUD Prophylaxis : Pepcid  Disposition Plan  :    Status is: Inpatient  Remains inpatient appropriate because:IV treatments appropriate due to intensity of illness or inability to take PO  Dispo: The patient is from: Home              Anticipated d/c is to: Home              Anticipated d/c date is: > 3 days              Patient currently is not medically stable to d/c.  DVT Prophylaxis  :   Heparin  Gtt >> Lovenox  Lab Results  Component Value Date   PLT 207 04/30/2020    Diet :  Diet Order            DIET SOFT Room service appropriate? Yes; Fluid consistency: Thin  Diet  effective now                  Inpatient Medications  Scheduled Meds: . (feeding supplement) PROSource Plus  30 mL Oral BID BM  . atorvastatin  10 mg Oral Daily  . cholecalciferol  2,000 Units Oral Daily  . enoxaparin (LOVENOX) injection  100 mg Subcutaneous Q12H  . furosemide  20 mg Intravenous Once  . hydroxychloroquine  200 mg Oral q morning - 10a  . levofloxacin  750 mg Oral Daily  . magnesium gluconate  500 mg Oral Daily  . methylPREDNISolone (SOLU-MEDROL) injection  60 mg Intravenous Daily  . metoprolol tartrate  25 mg Oral BID  . oxybutynin  10 mg Oral QHS  . [START ON 24-May-2020] pantoprazole  80 mg Oral Daily  . traZODone  50  mg Oral QHS   Continuous Infusions: . magnesium sulfate bolus IVPB     PRN Meds:.acetaminophen **OR** [DISCONTINUED] acetaminophen, albuterol, baclofen, bisacodyl, guaiFENesin-dextromethorphan, [DISCONTINUED] ondansetron **OR** ondansetron (ZOFRAN) IV, polyvinyl alcohol, sodium chloride  Antibiotics  :    Anti-infectives (From admission, onward)   Start     Dose/Rate Route Frequency Ordered Stop   04/29/20 1000  hydroxychloroquine (PLAQUENIL) tablet 200 mg        200 mg Oral  Every morning - 10a 04/30/2020 1723     04/29/20 0600  vancomycin (VANCOREADY) IVPB 750 mg/150 mL  Status:  Discontinued        750 mg 150 mL/hr over 60 Minutes Intravenous Every 12 hours 04/26/2020 1728 04/29/20 1022   05/16/2020 1745  levofloxacin (LEVAQUIN) tablet 750 mg        750 mg Oral Daily 05/10/2020 1738 05/02/20 0959   05/12/2020 1715  ceFEPIme (MAXIPIME) 2 g in sodium chloride 0.9 % 100 mL IVPB        2 g 200 mL/hr over 30 Minutes Intravenous  Once 05/07/2020 1712 04/27/2020 1825   05/02/2020 1715  vancomycin (VANCOREADY) IVPB 2000 mg/400 mL        2,000 mg 200 mL/hr over 120 Minutes Intravenous  Once 04/20/2020 1714 04/24/2020 2054       Time Spent in minutes  30   Lala Lund M.D on 04/30/2020 at 11:20 AM  To page go to [email or web address] - password [email or web address]  [facility] -  Office  [phone]    See all Orders from today for further details    Objective:   Vitals:   04/30/20 0356 04/30/20 0413 04/30/20 0601 04/30/20 0755  BP:  119/72 117/62 (!) 106/50  Pulse: 95 92 95 (!) 101  Resp: (!) 30 (!) 24 (!) 29 (!) 24  Temp:    (!) 100.6 F (38.1 C)  TempSrc:    Oral  SpO2: 93% 96% 90% 91%  Weight:      Height:        Wt Readings from Last 3 Encounters:  05/15/2020 96.1 kg  04/26/20 96.1 kg  04/11/20 96.1 kg     Intake/Output Summary (Last 24 hours) at 04/30/2020 1120 Last data filed at 04/30/2020 0700 Gross per 24 hour  Intake 504.08 ml  Output 1050 ml  Net -545.92 ml     Physical Exam  Awake Alert, No new F.N deficits, Normal affect [place].AT,PERRAL Supple Neck,No JVD, No cervical lymphadenopathy appriciated.  Symmetrical Chest wall movement, coarse bilateral crackles RRR,No Gallops, Rubs or new Murmurs, No Parasternal Heave +ve B.Sounds, Abd Soft, No tenderness, No organomegaly appriciated, No rebound - guarding or rigidity. No Cyanosis, left leg swollen as compared to the right    Data Review:    CBC Recent Labs  Lab 05/17/2020 1536 04/29/20 0205 04/30/20 0313  WBC 9.1 8.0 9.1  HGB 12.3 11.2* 11.8*  HCT 37.9 35.1* 35.6*  PLT 155 157 207  MCV 97.7 96.4 95.2  MCH 31.7 30.8 31.6  MCHC 32.5 31.9 33.1  RDW 19.9* 19.2* 18.5*  LYMPHSABS 0.9 1.5 1.0  MONOABS 1.0 1.0 1.1*  EOSABS 0.2 0.3 0.2  BASOSABS 0.1 0.0 0.0    Recent Labs  Lab 04/18/2020 1536 05/03/2020 1712 05/16/2020 1920 04/29/20 0205 04/30/20 0313  NA 141  --   --  143 140  K 4.3  --   --  4.1 4.3  CL 111  --   --  108 104  CO2 21*  --   --  26 27  GLUCOSE 116*  --   --  84 102*  BUN 16  --   --  14 19  CREATININE 1.03*  --   --  0.98 1.14*  CALCIUM 10.8*  --   --  10.7* 10.6*  AST 19  --   --  16 15  ALT 24  --   --  19 18  ALKPHOS 86  --   --  73 91  BILITOT 1.0  --   --  1.3* 1.0  ALBUMIN 3.0*  --   --  2.6* 2.4*  MG  --   --   --  1.9 1.6*    CRP  --   --  10.9* 12.0* 23.6*  DDIMER >20.00*  --   --  16.04* 4.16*  PROCALCITON  --   --  0.13 <0.10 0.34  LATICACIDVEN  --  1.3 1.8  --   --   INR 1.2  --   --   --   --   BNP 592.1*  --   --  545.8* 421.2*    ------------------------------------------------------------------------------------------------------------------ No results for input(s): CHOL, HDL, LDLCALC, TRIG, CHOLHDL, LDLDIRECT in the last 72 hours.  Lab Results  Component Value Date   HGBA1C 5.5 02/19/2018   ------------------------------------------------------------------------------------------------------------------ No results for input(s): TSH, T4TOTAL, T3FREE, THYROIDAB in the last 72 hours.  Invalid input(s): FREET3  Cardiac Enzymes No results for input(s): CKMB, TROPONINI, MYOGLOBIN in the last 168 hours.  Invalid input(s): CK ------------------------------------------------------------------------------------------------------------------    Component Value Date/Time   BNP 421.2 (H) 04/30/2020 3235    Micro Results Recent Results (from the past 240 hour(s))  Culture, blood (routine x 2)     Status: None (Preliminary result)   Collection Time: 05/14/2020  5:12 PM   Specimen: BLOOD  Result Value Ref Range Status   Specimen Description BLOOD BLOOD LEFT WRIST  Final   Special Requests   Final    BOTTLES DRAWN AEROBIC AND ANAEROBIC Blood Culture results may not be optimal due to an inadequate volume of blood received in culture bottles   Culture   Final    NO GROWTH 2 DAYS Performed at [facility], [place] [address] [place], [place] [address]    Report Status PENDING  Incomplete  Culture, blood (routine x 2)     Status: None (Preliminary result)   Collection Time: 05/12/2020  7:20 PM   Specimen: BLOOD  Result Value Ref Range Status   Specimen Description BLOOD RIGHT ANTECUBITAL  Final   Special Requests   Final    BOTTLES DRAWN AEROBIC ONLY Blood Culture results may not be optimal due to  an inadequate volume of blood received in culture bottles   Culture   Final    NO GROWTH 2 DAYS Performed at [facility], [place] [address], [place], [place] [address]    Report Status PENDING  Incomplete    Radiology Reports CT CHEST WO CONTRAST  Result Date: 04/30/2020 CLINICAL DATA:  Chest pain, shortness of breath. Recently diagnosed with COVID in October EXAM: CT CHEST WITHOUT CONTRAST TECHNIQUE: Multidetector CT imaging of the chest was performed following the standard protocol without IV contrast. COMPARISON:  Prior CT scan of the chest 03/25/2020 FINDINGS: Cardiovascular: Limited evaluation in the absence of intravenous contrast. Tortuous and atherosclerotic thoracic aorta. The main pulmonary artery is enlarged at 3.6 cm. Mild cardiomegaly. Trace pericardial effusion. Atherosclerotic calcifications noted along the course of the left anterior descending coronary artery.  Mediastinum/Nodes: Incidental note is made of a small nodule posterior the upper pole of the left thyroid gland. This measures less than 1.5 cm and therefore requires no further evaluation. Interval development of left AP window lymph nodes measuring up to 1.1 cm in short axis. Almost certainly reactive in nature given the relatively short time frame since the prior examination. Unremarkable esophagus. Lungs/Pleura: Severe interval progression of pulmonary parenchymal disease. Interval development of extensive bronchiectasis most severe in the right upper and lower lobes but also present in the periphery of the left upper lobe. Additionally, there is new emphysematous change in the right lung apex and extensive interstitial and confluent airspace opacities present throughout the portions of the lung affected with bronchiectatic changes. No pleural effusion. No pneumothorax. Upper Abdomen: No acute abnormality within the upper abdomen. Left renal and hepatic cysts again noted. Musculoskeletal: No acute fracture or aggressive  appearing lytic or blastic osseous lesion. IMPRESSION: 1. Interval development of severe bilateral pulmonary parenchymal disease compared to the relatively recent prior imaging dated 03/25/2020. There is new significant bronchiectasis throughout the posterior aspect of the right upper lobe, the right lower lobe and within the periphery of the left upper lobe. The associated pulmonary parenchyma is nearly completely replaced with interstitial and confluent airspace opacities. Findings are most consistent with severe post COVID scarring and architectural distortion. 2. New emphysematous changes in the right upper lobe near the apex. 3. No evidence of pleural effusion or pneumothorax. 4. Increasing prominence of mediastinal lymph nodes are almost certainly reactive in nature. 5. Enlarged main pulmonary artery suggests developing pulmonary arterial hypertension. 6. Coronary artery and aortic atherosclerotic calcifications. Aortic Atherosclerosis (ICD10-I70.0) and Emphysema (ICD10-J43.9). Electronically Signed   By: Jacqulynn Cadet M.D.   On: 04/30/2020 11:13   CARDIAC CATHETERIZATION  Result Date: 04/15/2020 Medical treatment for possible microvascular disease or demand ischemia. Low dose aspirin as tolerated.  NM Pulmonary Perf and Vent  Result Date: 04/29/2020 CLINICAL DATA:  74 year old female discharged from the hospital 3.5 weeks ago after being treated for COVID-19. Pneumonia gradual progressive shortness of breath, lower extremity swelling. EXAM: NUCLEAR MEDICINE PERFUSION LUNG SCAN TECHNIQUE: Perfusion images were obtained in multiple projections after intravenous injection of radiopharmaceutical. Ventilation scans intentionally deferred if perfusion scan and chest x-ray adequate for interpretation during COVID 19 epidemic. RADIOPHARMACEUTICALS:  4.4 mCi Tc-1m MAA IV COMPARISON:  Portable chest radiograph yesterday. FINDINGS: Generalized mildly to moderately decreased perfusion radiotracer in the  right lung, corresponding to asymmetric coarse interstitial opacity on that side yesterday. Comparatively normal perfusion radiotracer activity on the left. No wedge-shaped or suspicious perfusion defect identified. IMPRESSION: 1. No strong evidence of acute pulmonary embolus. 2. Generalized decreased perfusion in the right lung which demonstrated widespread abnormal opacity on portable chest x-ray yesterday. Electronically Signed   By: Genevie Ann M.D.   On: 04/29/2020 15:33   DG Chest Port 1 View  Result Date: 05/14/2020 CLINICAL DATA:  Shortness of breath EXAM: PORTABLE CHEST 1 VIEW COMPARISON:  04/03/2020, 03/31/2020 CT 03/25/2020 FINDINGS: Significant interval worsening right greater than left pulmonary airspace opacities concerning for bilateral pneumonia. Enlarged cardiomediastinal silhouette with aortic atherosclerosis. No significant effusion. No pneumothorax. IMPRESSION: Significant interval worsening of right greater than left pulmonary airspace opacities concerning for bilateral pneumonia. Electronically Signed   By: Donavan Foil M.D.   On: 04/20/2020 17:03   DG Chest Port 1 View  Result Date: 04/03/2020 CLINICAL DATA:  Shortness of breath. EXAM: PORTABLE CHEST 1 VIEW COMPARISON:  03/31/2020 chest radiograph and prior.  FINDINGS: Bilateral mid to lower lung patchy opacities are more conspicuous than prior exam. No pneumothorax or pleural effusion. Mild cardiomegaly and aortic arch atherosclerotic calcifications. Osseous structures are unchanged. IMPRESSION: Slightly increased conspicuity of bilateral pulmonary opacities concerning for multifocal pneumonia. Electronically Signed   By: Primitivo Gauze M.D.   On: 04/03/2020 08:24   ECHOCARDIOGRAM COMPLETE  Result Date: 04/29/2020    ECHOCARDIOGRAM REPORT   Patient Name:   NOHEMY KOOP Date of Exam: 04/29/2020 Medical Rec #:  [id number]         Height:       63.0 in Accession #:    [id number]        Weight:       211.9 lb Date of Birth:   [birth year]         BSA:          1.982 m Patient Age:    39 years          BP:           119/72 mmHg Patient Gender: F                 HR:           103 bpm. Exam Location:  Inpatient Procedure: 2D Echo, Color Doppler, Cardiac Doppler and Intracardiac            Opacification Agent Indications:    X44.81 Acute diastolic (congestive) heart failure  History:        Patient has prior history of Echocardiogram examinations, most                 recent 04/06/2020. Risk Factors:Hypertension, Dyslipidemia,                 Sleep Apnea and COVID+ 03/24/20.  Sonographer:    Raquel Sarna Senior RDCS Referring Phys: Graylin Shiver [facility]  Sonographer Comments: Technically difficult due to patient body habitus. IMPRESSIONS  1. Left ventricular ejection fraction, by estimation, is 60 to 65%. The left ventricle has normal function. The left ventricle has no regional wall motion abnormalities. There is mild left ventricular hypertrophy. Left ventricular diastolic parameters are consistent with Grade I diastolic dysfunction (impaired relaxation).  2. Right ventricular systolic function is moderately reduced. The right ventricular size is mildly enlarged. With RV free wall hypokinesis and preserved apical function, would consider PE. There is normal pulmonary artery systolic pressure. The estimated right ventricular systolic pressure is 85.6 mmHg.  3. The mitral valve is normal in structure. No evidence of mitral valve regurgitation. No evidence of mitral stenosis.  4. The aortic valve is tricuspid. Aortic valve regurgitation is not visualized. No aortic stenosis is present.  5. Aortic dilatation noted. There is mild dilatation of the ascending aorta, measuring 38 mm.  6. The inferior vena cava is normal in size with <50% respiratory variability, suggesting right atrial pressure of 8 mmHg.  7. 5 cm x 5 cm cystic structure in liver. FINDINGS  Left Ventricle: Left ventricular ejection fraction, by estimation, is 60 to 65%. The left ventricle  has normal function. The left ventricle has no regional wall motion abnormalities. Definity contrast agent was given IV to delineate the left ventricular  endocardial borders. The left ventricular internal cavity size was normal in size. There is mild left ventricular hypertrophy. Left ventricular diastolic parameters are consistent with Grade I diastolic dysfunction (impaired relaxation). Right Ventricle: The right ventricular size is mildly enlarged. No increase in right ventricular wall thickness. Right ventricular  systolic function is moderately reduced. There is normal pulmonary artery systolic pressure. The tricuspid regurgitant velocity is 2.52 m/s, and with an assumed right atrial pressure of 8 mmHg, the estimated right ventricular systolic pressure is 26.3 mmHg. Left Atrium: Left atrial size was normal in size. Right Atrium: Right atrial size was normal in size. Pericardium: There is no evidence of pericardial effusion. Mitral Valve: The mitral valve is normal in structure. No evidence of mitral valve regurgitation. No evidence of mitral valve stenosis. Tricuspid Valve: The tricuspid valve is normal in structure. Tricuspid valve regurgitation is trivial. Aortic Valve: The aortic valve is tricuspid. Aortic valve regurgitation is not visualized. No aortic stenosis is present. Pulmonic Valve: The pulmonic valve was not well visualized. Pulmonic valve regurgitation is not visualized. Aorta: Aortic dilatation noted. There is mild dilatation of the ascending aorta, measuring 38 mm. Venous: The inferior vena cava is normal in size with less than 50% respiratory variability, suggesting right atrial pressure of 8 mmHg. IAS/Shunts: No atrial level shunt detected by color flow Doppler.  LEFT VENTRICLE PLAX 2D LVIDd:         3.20 cm  Diastology LVIDs:         1.70 cm  LV e' medial:    4.68 cm/s LV PW:         1.20 cm  LV E/e' medial:  10.2 LV IVS:        1.40 cm  LV e' lateral:   4.35 cm/s LVOT diam:     2.00 cm  LV  E/e' lateral: 10.9 LV SV:         52 LV SV Index:   26 LVOT Area:     3.14 cm  RIGHT VENTRICLE RV S prime:     12.60 cm/s TAPSE (M-mode): 1.9 cm LEFT ATRIUM             Index       RIGHT ATRIUM           Index LA diam:        3.20 cm 1.61 cm/m  RA Area:     19.10 cm LA Vol (A2C):   47.4 ml 23.92 ml/m RA Volume:   49.10 ml  24.78 ml/m LA Vol (A4C):   43.1 ml 21.75 ml/m LA Biplane Vol: 45.9 ml 23.16 ml/m  AORTIC VALVE LVOT Vmax:   112.00 cm/s LVOT Vmean:  75.200 cm/s LVOT VTI:    0.167 m  AORTA Ao Root diam: 3.20 cm Ao Asc diam:  3.80 cm MITRAL VALVE               TRICUSPID VALVE MV Area (PHT): 3.76 cm    TR Peak grad:   25.4 mmHg MV Decel Time: 202 msec    TR Vmax:        252.00 cm/s MV E velocity: 47.60 cm/s MV A velocity: 92.10 cm/s  SHUNTS MV E/A ratio:  0.52        Systemic VTI:  0.17 m                            Systemic Diam: 2.00 cm Loralie Champagne MD Electronically signed by Loralie Champagne MD Signature Date/Time: 04/29/2020/4:25:58 PM    Final    VAS [place] LOWER EXTREMITY VENOUS (DVT) (ONLY [facility] & [facility])  Result Date: 05/07/2020  Lower Venous DVT Study Indications: Recent COVID infection 03/2020, now with D-dimer >20 and hypoxia.  Limitations: Poor ultrasound/tissue interface.  Comparison Study: 03/26/2020 negative lower extremity venous duplex Performing Technologist: Maudry Mayhew MHA, RDMS, RVT, RDCS  Examination Guidelines: A complete evaluation includes B-mode imaging, spectral Doppler, color Doppler, and power Doppler as needed of all accessible portions of each vessel. Bilateral testing is considered an integral part of a complete examination. Limited examinations for reoccurring indications may be performed as noted. The reflux portion of the exam is performed with the patient in reverse Trendelenburg.  +---------+---------------+---------+-----------+----------+--------------+ RIGHT    CompressibilityPhasicitySpontaneityPropertiesThrombus Aging  +---------+---------------+---------+-----------+----------+--------------+ CFV      Full           No       Yes                                 +---------+---------------+---------+-----------+----------+--------------+ SFJ      Full                                                        +---------+---------------+---------+-----------+----------+--------------+ FV Prox  Full                                                        +---------+---------------+---------+-----------+----------+--------------+ FV Mid   Full                                                        +---------+---------------+---------+-----------+----------+--------------+ FV DistalFull                                                        +---------+---------------+---------+-----------+----------+--------------+ PFV      Full                                                        +---------+---------------+---------+-----------+----------+--------------+ POP      Full           No       Yes                                 +---------+---------------+---------+-----------+----------+--------------+ PTV      Full                                                        +---------+---------------+---------+-----------+----------+--------------+ PERO     Full                                                        +---------+---------------+---------+-----------+----------+--------------+   +---------+---------------+---------+-----------+----------+--------------+  LEFT     CompressibilityPhasicitySpontaneityPropertiesThrombus Aging +---------+---------------+---------+-----------+----------+--------------+ CFV      Full           No       Yes                                 +---------+---------------+---------+-----------+----------+--------------+ SFJ      Full                                                         +---------+---------------+---------+-----------+----------+--------------+ FV Prox  None                                         Acute          +---------+---------------+---------+-----------+----------+--------------+ FV Mid   None                                         Acute          +---------+---------------+---------+-----------+----------+--------------+ FV DistalNone                                         Acute          +---------+---------------+---------+-----------+----------+--------------+ PFV      Full                                                        +---------+---------------+---------+-----------+----------+--------------+ POP      None                    No                   Acute          +---------+---------------+---------+-----------+----------+--------------+ PTV      None                                         Acute          +---------+---------------+---------+-----------+----------+--------------+   Left Technical Findings: Not visualized segments include peroneal veins not adequately visualized.   Summary: RIGHT: - There is no evidence of deep vein thrombosis in the lower extremity.  - No cystic structure found in the popliteal fossa.  LEFT: - Findings consistent with acute deep vein thrombosis involving the left femoral vein, left popliteal vein, and left posterior tibial veins. - No cystic structure found in the popliteal fossa.  Lower extremity venous flow is pulsatile, suggestive of possibly elevated right heart pressure.  *See table(s) above for measurements and observations.    Preliminary    ECHOCARDIOGRAM LIMITED  Result Date: 04/06/2020    ECHOCARDIOGRAM LIMITED REPORT   Patient Name:   STARSHA MORNING Date of Exam: 04/06/2020 Medical Rec #:  [id number]         Height:       63.0 in Accession #:    [id number]        Weight:       225.0 lb Date of Birth:  06-04-1946         BSA:          2.033 m Patient Age:    31 years           BP:           111/75 mmHg Patient Gender: F                 HR:           96 bpm. Exam Location:  Inpatient Procedure: Limited Echo, Cardiac Doppler, Color Doppler and Intracardiac            Opacification Agent Indications:    Dyspnea  History:        Patient has no prior history of Echocardiogram examinations.                 Risk Factors:Dyslipidemia and Former Smoker. COVID-19.  Sonographer:    Clayton Lefort RDCS (AE) Referring Phys: [id number] DAWOOD Graciela Husbands  Sonographer Comments: Technically challenging study due to limited acoustic windows, Technically difficult study due to poor echo windows, suboptimal parasternal window, suboptimal apical window, suboptimal subcostal window and patient is morbidly obese.  COVID-19. IMPRESSIONS  1. Left ventricular ejection fraction, by estimation, is 60 to 65%. The left ventricle has normal function.  2. Right ventricular systolic function was not well visualized. The right ventricular size is moderately enlarged.  3. The mitral valve was not well visualized.  4. The aortic valve was not well visualized.  5. The inferior vena cava is normal in size with <50% respiratory variability, suggesting right atrial pressure of 8 mmHg. Comparison(s): No prior Echocardiogram. Conclusion(s)/Recommendation(s): Technically challenging study with limited windows. Even with use of echo contrast, able to assess normal LVEF but limited for wall motion analysis. No significant valve disease by doppler, but not well visualized. FINDINGS  Left Ventricle: Left ventricular ejection fraction, by estimation, is 60 to 65%. The left ventricle has normal function. Definity contrast agent was given IV to delineate the left ventricular endocardial borders. Right Ventricle: The right ventricular size is moderately enlarged. Right vetricular wall thickness was not well visualized. Right ventricular systolic function was not well visualized. Left Atrium: Left atrial size was not well visualized. Right  Atrium: Right atrial size was not well visualized. Pericardium: The pericardium was not well visualized. Mitral Valve: The mitral valve was not well visualized. Tricuspid Valve: The tricuspid valve is not well visualized. Aortic Valve: The aortic valve was not well visualized. Pulmonic Valve: The pulmonic valve was not well visualized. Aorta: The aortic root was not well visualized, the ascending aorta was not well visualized and the aortic arch was not well visualized. Venous: The inferior vena cava is normal in size with less than 50% respiratory variability, suggesting right atrial pressure of 8 mmHg. LEFT VENTRICLE PLAX 2D LVOT diam:     2.10 cm  Diastology LVOT Area:     3.46 cm LV e' medial:    3.37 cm/s                         LV E/e' medial:  11.1  LV e' lateral:   4.46 cm/s                         LV E/e' lateral: 8.4  IVC IVC diam: 1.70 cm  AORTA Ao Root diam: 3.30 cm Ao Asc diam:  3.30 cm MITRAL VALVE MV Area (PHT): 3.68 cm    SHUNTS MV Decel Time: 206 msec    Systemic Diam: 2.10 cm MV E velocity: 37.30 cm/s MV A velocity: 72.40 cm/s MV E/A ratio:  0.52 Buford Dresser MD Electronically signed by Buford Dresser MD Signature Date/Time: 04/06/2020/2:33:32 PM    Final

## 2020-04-30 NOTE — Evaluation (Signed)
Occupational Therapy Evaluation Patient Details Name: ANUSHA CLAUS MRN: [id number] DOB: 1946/06/08 Today's Date: 04/30/2020    History of Present Illness Amanda Ross  is a 74 y.o. female, with recent treated Covid 19 PNA > 3 weeks ago, Sjogren's syndrome, hypertension, asthma, left thyroid nodule, who was discharged about 3-1/2 weeks ago after treatment of COVID-19 pneumonia presents to the ER with gradually progressive shortness of breath which started about a week after she went home,   Clinical Impression   Patient admitted for the above diagnosis.  Presents with significant DOE and low O2 saturations.  Patient has been bumped up to 15L and is currently on a nonrebreather mask.  She has the physical capacity to perform mobility and self care, she is limited by fatigue and sats.  OT spent a lot of time today working on pursed lip breathing, breaking down movements into steps, and giving herself time to recover between steps.  OT will follow in the acute setting, [facility] will be recommended for now, SNF may be needed for continued rehab post acute.      Follow Up Recommendations  Home health OT;Supervision - Intermittent    Equipment Recommendations  Tub/shower bench    Recommendations for Other Services       Precautions / Restrictions Precautions Precautions: Fall Precaution Comments: O2 sats and HR Restrictions Weight Bearing Restrictions: No      Mobility Bed Mobility   Bed Mobility: Supine to Sit     Supine to sit: Modified independent (Device/Increase time);HOB elevated          Transfers Overall transfer level: Needs assistance Equipment used: Rolling walker (2 wheeled) Transfers: Sit to/from [facility] Sit to Stand: Min guard Stand pivot transfers: Min guard       General transfer comment: Assist for safety/lines    Balance Overall balance assessment: Modified Independent Sitting-balance support: Feet supported Sitting  balance-Leahy Scale: Good     Standing balance support: Bilateral upper extremity supported;During functional activity Standing balance-Leahy Scale: Fair                             ADL either performed or assessed with clinical judgement   ADL   Eating/Feeding: Independent;Sitting   Grooming: Supervision/safety;Standing;Wash/dry face;Wash/dry hands;Oral care           Upper Body Dressing : Minimal assistance;Sitting   Lower Body Dressing: Minimal assistance;Sit to/from stand               Functional mobility during ADLs: Min guard;Rolling walker       Vision Baseline Vision/History: Wears glasses Wears Glasses: At all times Patient Visual Report: No change from baseline       Perception     Praxis      Pertinent Vitals/Pain Faces Pain Scale: Hurts a little bit Pain Location: chest muscles and ribs when breathing Pain Descriptors / Indicators: Nagging Pain Intervention(s): Monitored during session;Repositioned     Hand Dominance Right   Extremity/Trunk Assessment Upper Extremity Assessment Upper Extremity Assessment: Generalized weakness   Lower Extremity Assessment Lower Extremity Assessment: Defer to PT evaluation   Cervical / Trunk Assessment Cervical / Trunk Assessment: Normal   Communication Communication Communication: No difficulties   Cognition  General Comments  Currently on 15 L via non rebreather.  Desats to 78% with activity.  Increased time to recover.  Never got above 90%    Exercises     Shoulder Instructions      Home Living Family/patient expects to be discharged to:: Private residence Living Arrangements: Spouse/significant other Available Help at Discharge: Family Type of Home: House Home Access: Stairs to enter Technical brewer of Steps: 3 Entrance Stairs-Rails: Right;Left Home Layout: One level     Bathroom Shower/Tub: Animal nutritionist: [place]: Environmental consultant - 2 wheels;Shower seat;Bedside commode;Cane - quad          Prior Functioning/Environment Level of Independence: Independent with assistive device(s)        Comments: since recent discharge she has been using walker and went home on O2        OT Problem List: Decreased activity tolerance;Cardiopulmonary status limiting activity      OT Treatment/Interventions: Self-care/ADL training;Therapeutic exercise;Energy conservation;DME and/or AE instruction;Therapeutic activities;Patient/family education    OT Goals(Current goals can be found in the care plan section) Acute Rehab OT Goals Patient Stated Goal: improve breathing, return home OT Goal Formulation: With patient Time For Goal Achievement: 04/11/20 Potential to Achieve Goals: Fair ADL Goals Pt Will Perform Grooming: Independently;sitting Pt Will Perform Lower Body Bathing: Independently;sit to/from stand Pt Will Perform Lower Body Dressing: Independently;sit to/from stand Pt Will Transfer to Toilet: with modified independence;regular height toilet;ambulating  OT Frequency: Min 2X/week   Barriers to D/C:  decreased support          Co-evaluation              AM-PAC OT "6 Clicks" Daily Activity     Outcome Measure Help from another person eating meals?: None Help from another person taking care of personal grooming?: A Little Help from another person toileting, which includes using toliet, bedpan, or urinal?: A Little Help from another person bathing (including washing, rinsing, drying)?: A Little Help from another person to put on and taking off regular upper body clothing?: A Little Help from another person to put on and taking off regular lower body clothing?: A Little 6 Click Score: 19   End of Session Equipment Utilized During Treatment: Oxygen;Rolling walker Nurse Communication: Mobility status  Activity Tolerance: Patient limited by  fatigue Patient left: in chair;with call bell/phone within reach;with nursing/sitter in room  OT Visit Diagnosis: Other (comment);Unsteadiness on feet (R26.81);Other abnormalities of gait and mobility (R26.89);Muscle weakness (generalized) (M62.81)                Time: 7544-9201 OT Time Calculation (min): 22 min Charges:  OT General Charges $OT Visit: 1 Visit OT Evaluation $OT Eval Moderate Complexity: 1 Mod  04/30/2020  Rich, OTR/L  Acute Rehabilitation Services  Office:  [place] 04/30/2020, 11:16 AM

## 2020-04-30 NOTE — Progress Notes (Addendum)
ANTICOAGULATION CONSULT NOTE - Follow Up Consult  Pharmacy Consult for Lovenox Indication: DVT +/- PE  Labs: Recent Labs    05/16/2020 1536 04/27/2020 1536 04/24/2020 1920 04/29/20 0205 04/29/20 0205 04/29/20 0826 04/29/20 1054 04/29/20 2044 04/30/20 0313  HGB 12.3   < >  --  11.2*  --   --   --   --  11.8*  HCT 37.9  --   --  35.1*  --   --   --   --  35.6*  PLT 155  --   --  157  --   --   --   --  207  LABPROT 14.3  --   --   --   --   --   --   --   --   INR 1.2  --   --   --   --   --   --   --   --   HEPARINUNFRC  --   --   --  <0.10*   < >  --  0.23* 0.30 0.54  CREATININE 1.03*  --   --  0.98  --   --   --   --  1.14*  TROPONINIHS 87*  --  103*  --   --  105*  --   --   --    < > = values in this interval not displayed.    Assessment: 74yo female admitted with acute hypoxic respiratory failure with new DVT and acute PE.  Heparin level is therapeutic on 1200 units/hr at 0.54. CBC stable, no reported bleeding.   Pharmacy is now being consulted to dose Lovenox.  Goal of Therapy:  Anti-Xa 0.6-1 units/mL Monitor platelets   Plan:  Stop heparin Start Lovenox 100 mg (1mg /kg) SQ every 12 hours Monitor CBC, weight  Romilda Garret, PharmD PGY1 Acute Care Pharmacy Resident Phone: [phone] 04/30/2020 8:05 AM  Please check [email or web address] for unit specific pharmacy phone numbers.

## 2020-04-30 NOTE — Consult Note (Signed)
Palliative Medicine Inpatient Consult Note  Reason for consult:  Goals of Care "We are parenchymal lung injury due to COVID-19 infection, extremely poor prognosis per pulmonary, patient and husband do not realize the gravity of situation."  HPI:  Per intake H&P --> Amanda Ross  is a 74 y.o. female, with recent treated Covid 19 PNA > 3 weeks ago, Sjogren's syndrome, hypertension, asthma, left thyroid nodule, diastolic heart failure. She discharged about 3-1/2 weeks ago after treatment of COVID-19 pneumonia now has progressive shortness of breath which started about a week after she went home. Identified to have significant parenchymal lung injury d/t COVID-19 has a very poor prognosis.   Palliative care was asked to get involved to aid in goals of care conversations with Amanda Ross and her husband, Amanda Ross.   Clinical Assessment/Goals of Care: I have reviewed medical records including EPIC notes, labs and imaging, received report from bedside RN, assessed the patient who was noted to be sitting up in her chair.   I met with Amanda Ross and Amanda Ross (husband)  to further discuss diagnosis prognosis, GOC, EOL wishes, disposition and options.   I introduced Palliative Medicine as specialized medical care for people living with serious illness. It focuses on providing relief from the symptoms and stress of a serious illness. The goal is to improve quality of life for both the patient and the family.   I asked Amanda Ross to tell me about herself. She shares that she lives in [place], [place]. She is married to her husband Amanda Ross and they have been wed for over [place] four years. Her husband prior to Amanda Ross passed away. They share five children together, ten grandchildren, and five great grandchildren. She enjoys spending time with her family. She is faithful and ascribes to the [facility] [employer] [facility] denomination.   Amanda Ross shares with me that prior to coming into the hospital she was very functional. She was able  to perform all bADL's for herself.   Amanda Ross shares that she was at a party a few weeks ago and was exposed to someone with [place]. She after that had more difficulty doing basic tasks and went to the urgent care where a diagnosis was made. She had a complicated 3.5 week course in the hospital. She was home for twelve days prior to having increased difficulty with her breathing. We reviewed that she has suffered a degree of injury from COVID-19 which despite aggressive measures is unlikely to resolve.   A detailed discussion was had today regarding advanced directives - patient has never completed these. She shares that her husband Amanda Ross would make decisions for her if she were unable to make decisions for herself.    Concepts specific to code status, artifical feeding and hydration, continued IV antibiotics and rehospitalization was had.  Amanda Ross shares that she had prior opted to be full code. We reviewed what full code entails. I shares that with her lung injury it would possible prolong her life but the burden would potentially be great - we further discussed the efforts pursued through resuscitation and how she may be on ventilation for long term. She shared that she would not want this.  We discussed that she would not want aggressive measures to keep her alive and that she would elect to be a DNAR/DNI.  The difference between a aggressive medical intervention path  and a palliative comfort care path for this patient at this time was had. I brought up hospice as a consideration given Amanda Ross's present state.  I described  hospice as a service for patients for have a life expectancy of < 6 months. It preserves dignity and quality at the end phases of life. The focus changes from curative to symptom relief. She said that she would be interesting in speaking to hospice but is not ready to make this decisions.  Amanda Ross plan to speak to their children more about next steps.  Discussed  the importance of continued conversation with family and their  medical providers regarding overall plan of care and treatment options, ensuring decisions are within the context of the patients values and GOCs.  Provided "Hard Choices for [employer]" booklet.   Decision Maker: Patient can make decisions for herself. If unable to do so she would rely on her husband, Amanda Ross.   SUMMARY OF RECOMMENDATIONS   DNAR/DNI  Provide information on Hospice - reached out to [facility]  Ongoing discussions regarding which direction to go with care  Code Status/Advance Care Planning: DNAR/DNI   Symptom Management:  Dyspnea:  - Morphine 2.45m PO Q3H PRN   Palliative Prophylaxis:   Oral care, OOB to chair, Delirium and aspirations precuations  Additional Recommendations (Limitations, Scope, Preferences):  Continue current scope of care   Psycho-social/Spiritual:   Desire for further Chaplaincy support: Yes  Additional Recommendations: Education on progressive lung disease   Prognosis: Poor in the setting of severe lung injury  Discharge Planning: Discharge plan unclear  Vitals:   04/30/20 0755 04/30/20 1233  BP: (!) 106/50 123/86  Pulse: (!) 101 (!) 111  Resp: (!) 24 (!) 21  Temp: (!) 100.6 F (38.1 C) 98.6 F (37 C)  SpO2: 91% (!) 87%    Intake/Output Summary (Last 24 hours) at 04/30/2020 1449 Last data filed at 04/30/2020 0700 Gross per 24 hour  Intake 504.08 ml  Output 1050 ml  Net -545.92 ml   Last Weight  Most recent update: 05/06/2020  3:31 PM   Weight  96.1 kg (211 lb 13.8 oz)           Gen:  Elderly AA F on NRBFW HEENT: dry mucous membranes CV: Regular rate and rhythm  PULM: On NRBFM, tachypneic ABD: soft/nontender  EXT: (+) Generalized edema Neuro: Alert and oriented x4  PPS: 50%   This conversation/these recommendations were discussed with patient primary care team, Dr. SCandiss Norse Time In: 1410 Time Out:  1520 Total Time: 70 Greater than 50%  of this time was spent counseling and coordinating care related to the above assessment and plan.  [facility] Team Cell Phone: [phone] utilize secure chat with additional questions, if there is no response within 30 minutes please call the above phone number  Palliative Medicine Team providers are available by phone from 7am to 7pm daily and can be reached through the team cell phone.  Should this patient require assistance outside of these hours, please call the patient's attending physician.

## 2020-05-01 DIAGNOSIS — J9601 Acute respiratory failure with hypoxia: Secondary | ICD-10-CM | POA: Diagnosis not present

## 2020-05-01 DIAGNOSIS — Z515 Encounter for palliative care: Secondary | ICD-10-CM | POA: Diagnosis not present

## 2020-05-01 DIAGNOSIS — L8990 Pressure ulcer of unspecified site, unspecified stage: Secondary | ICD-10-CM | POA: Insufficient documentation

## 2020-05-01 DIAGNOSIS — Z7189 Other specified counseling: Secondary | ICD-10-CM | POA: Diagnosis not present

## 2020-05-01 DIAGNOSIS — Z66 Do not resuscitate: Secondary | ICD-10-CM | POA: Diagnosis not present

## 2020-05-01 LAB — CBC WITH DIFFERENTIAL/PLATELET
Abs Immature Granulocytes: 0.06 10*3/uL (ref 0.00–0.07)
Basophils Absolute: 0 10*3/uL (ref 0.0–0.1)
Basophils Relative: 0 %
Eosinophils Absolute: 0 10*3/uL (ref 0.0–0.5)
Eosinophils Relative: 0 %
HCT: 36.6 % (ref 36.0–46.0)
Hemoglobin: 12.4 g/dL (ref 12.0–15.0)
Immature Granulocytes: 0 %
Lymphocytes Relative: 4 %
Lymphs Abs: 0.6 10*3/uL — ABNORMAL LOW (ref 0.7–4.0)
MCH: 31.3 pg (ref 26.0–34.0)
MCHC: 33.9 g/dL (ref 30.0–36.0)
MCV: 92.4 fL (ref 80.0–100.0)
Monocytes Absolute: 0.7 10*3/uL (ref 0.1–1.0)
Monocytes Relative: 5 %
Neutro Abs: 12.2 10*3/uL — ABNORMAL HIGH (ref 1.7–7.7)
Neutrophils Relative %: 91 %
Platelets: 241 10*3/uL (ref 150–400)
RBC: 3.96 MIL/uL (ref 3.87–5.11)
RDW: 17.8 % — ABNORMAL HIGH (ref 11.5–15.5)
WBC: 13.5 10*3/uL — ABNORMAL HIGH (ref 4.0–10.5)
nRBC: 0 % (ref 0.0–0.2)

## 2020-05-01 LAB — COMPREHENSIVE METABOLIC PANEL
ALT: 25 U/L (ref 0–44)
AST: 22 U/L (ref 15–41)
Albumin: 2.4 g/dL — ABNORMAL LOW (ref 3.5–5.0)
Alkaline Phosphatase: 99 U/L (ref 38–126)
Anion gap: 11 (ref 5–15)
BUN: 21 mg/dL (ref 8–23)
CO2: 26 mmol/L (ref 22–32)
Calcium: 11.3 mg/dL — ABNORMAL HIGH (ref 8.9–10.3)
Chloride: 103 mmol/L (ref 98–111)
Creatinine, Ser: 1.06 mg/dL — ABNORMAL HIGH (ref 0.44–1.00)
GFR, Estimated: 55 mL/min — ABNORMAL LOW (ref >60)
Glucose, Bld: 123 mg/dL — ABNORMAL HIGH (ref 70–99)
Potassium: 4.3 mmol/L (ref 3.5–5.1)
Sodium: 140 mmol/L (ref 135–145)
Total Bilirubin: 1.1 mg/dL (ref 0.3–1.2)
Total Protein: 6.1 g/dL — ABNORMAL LOW (ref 6.5–8.1)

## 2020-05-01 LAB — BRAIN NATRIURETIC PEPTIDE: B Natriuretic Peptide: 621.4 pg/mL — ABNORMAL HIGH (ref 0.0–100.0)

## 2020-05-01 LAB — URINE CULTURE: Culture: NO GROWTH

## 2020-05-01 LAB — C-REACTIVE PROTEIN: CRP: 29.4 mg/dL — ABNORMAL HIGH (ref <1.0)

## 2020-05-01 LAB — D-DIMER, QUANTITATIVE: D-Dimer, Quant: 2.9 ug/mL-FEU — ABNORMAL HIGH (ref 0.00–0.50)

## 2020-05-01 LAB — MAGNESIUM: Magnesium: 2.2 mg/dL (ref 1.7–2.4)

## 2020-05-01 NOTE — Progress Notes (Addendum)
Noted referral from [facility] regarding having [facility] come speak with pt/family about Hospice services- call made to [facility] to request this. On call services will have a hospital liaison come f/u with pt/family to provide Hospice information

## 2020-05-01 NOTE — Progress Notes (Addendum)
Palliative Medicine Inpatient Follow Up Note  Reason for consult:  Goals of Care "We are parenchymal lung injury due to COVID-19 infection, extremely poor prognosis per pulmonary, patient and husband do not realize the gravity of situation."  HPI:  Per intake H&P --> Amanda Ross is a 74 y.o. female, with recent treated Covid 19 PNA >3 weeks ago, Sjogren's syndrome, hypertension, asthma, left thyroid nodule, diastolic heart failure. She discharged about 3-1/2 weeks ago after treatment of COVID-19 pneumonia now has progressive shortness of breath which started about a week after she went home. Identified to have significant parenchymal lung injury d/t COVID-19 has a very poor prognosis.   Palliative care was asked to get involved to aid in goals of care conversations with Amanda Ross and her husband, Amanda Ross.   Today's Discussion (05/01/2020): Chart reviewed. I met with Amanda Ross at bedside. She was in fair spirits. She had gotten to the chair with physical therapy. She is now onf heated high flow nasal cannula and NRBFM she remains to have severe desaturations when her NRBFM is removed. She shares with me that she has thought about our conversation from the day prior. She states, "I don't know what to do." She shares that she remains overwhelmed. Her daughters are coming into town from [place]. We talked about having a family meeting tomorrow to  discuss her situation and ideally to help determine what steps to take next. She was very much on board with this and gave me permission to call her husband to try to coordinate this.  I called Amanda Ross this afternoon. He shares that he right now does not know the time his daughters are getting into town. He states that if we can call him in the morning he will have a better idea.   I informed [facility] of the plan for a meeting tomorrow which they too would like to participate in given the likely direction of the family.   Discussed the importance of  continued conversation with family and their  medical providers regarding overall plan of care and treatment options, ensuring decisions are within the context of the patients values and GOCs.  Questions and concerns addressed   Objective Assessment: Vital Signs Vitals:   05/01/20 0717 05/01/20 1004  BP: 140/69 115/73  Pulse: 94 (!) 106  Resp: (!) 23 (!) 21  Temp: 97.7 F (36.5 C)   SpO2: 91% 95%    Intake/Output Summary (Last 24 hours) at 05/01/2020 1328 Last data filed at 05/01/2020 0500 Gross per 24 hour  Intake 691.77 ml  Output 300 ml  Net 391.77 ml   Last Weight  Most recent update: 05/06/2020  3:31 PM   Weight  96.1 kg (211 lb 13.8 oz)           Gen:  Elderly AA F on NRBFW HEENT: dry mucous membranes CV: Regular rate and rhythm  PULM: On heated HFNC & NRBFM, tachypneic ABD: soft/nontender  EXT: (+) Generalized edema Neuro: Alert and oriented x4  SUMMARY OF RECOMMENDATIONS DNAR/DNI  Transitioned to heated HFNC requested RT use on oxymizer to see if we could ideally get the NRBFM off - right now patient is too tenuous per conversation with RT  [employer]  Ongoing discussions regarding which direction to go with care - plan for family meeting in the next day or so. PMT will call Amanda Ross in the morning to arrange a time. [facility] would appreciate participation in the meeting as well therefore we plan to reach  out to the liaison once a time is solidified.   Code Status/Advance Care Planning: DNAR/DNI  Symptom Management: Dyspnea:                 - Morphine 2.5m PO Q3H PRN  Time Spent: 35 Greater than 50% of the time was spent in counseling and coordination of care ______________________________________________________________________________________ [facility] Team Cell Phone: [phone] utilize secure chat with additional questions, if there is no response within 30 minutes  please call the above phone number  Palliative Medicine Team providers are available by phone from 7am to 7pm daily and can be reached through the team cell phone.  Should this patient require assistance outside of these hours, please call the patient's attending physician.

## 2020-05-01 NOTE — Progress Notes (Signed)
Manufacturing engineer [facility])  Request received to speak with family about GOC and d/c plans.  Contacted Mr. Lada to discuss hospice, he had already left the hospital but was able to discuss by phone.  He feels that they are "leaning towards" hospice.    Discussed hospice philosophy and answered questions.  He states his children are coming in from [place] on Monday and likely will have more questions.    Advised we can meet tomorrow, he requested we try to call him since he will be facilitating his children arriving from [place].  DME discussed, if they elect to go with hospice, they will need O2 switched out, currently her O2 at home is max of 4 lpm. Denies a need for bed at this time.  ACC will follow back up with him and hospital team on Monday to see if this will be the plan moving forward.  Venia Carbon RN, BSN, [facility] Liaison

## 2020-05-01 NOTE — Progress Notes (Signed)
Physical Therapy Treatment Patient Details Name: Amanda Ross MRN: [id number] DOB: August 08, 1945 Today's Date: 05/01/2020    History of Present Illness Amanda Ross  is a 74 y.o. female, with recent treated Covid 19 PNA > 3 weeks ago, Sjogren's syndrome, hypertension, asthma, left thyroid nodule, who was discharged about 3-1/2 weeks ago after treatment of COVID-19 pneumonia presents to the ER with gradually progressive shortness of breath which started about a week after she went home,    PT Comments    Pt continues to have poor medical prognosis with her diminishing pulmonary function. Pt now on NRB and 15L HF O2 via [place]. Pt able to move with min guard to EOB and then to chair however SpO2 dec into 60s and patient experiences 4/4 DOE and requires >20-30 min to recover to new baseline of 91% Spo2. Pt very pleasant and willing to do all PT asked however pt unable to tolerate more than transfer to chair. Pt given LE HEP and instructed to do either in bed or up in chair once she feels rest. Instructed to complete slow and in groups of 5 reps and to take rest breaks in between. Re-educated on purse lipped breathing. Palliative care involved and working with family on accepting pt's poor medical prognosis. Pt is now a DNR. Pt will benefit from a hospital bed if pt able to transition home with hospice. Acute PT to cont to follow.    Follow Up Recommendations  Home health PT;Supervision/Assistance - 24 hour (palliative on board recommending hospice)     Equipment Recommendations  None recommended by PT (may need hospital bed if going home with hospice)    Recommendations for Other Services       Precautions / Restrictions Precautions Precautions: Fall Precaution Comments: O2 stats will drop into 60s with movement despite 15Lo2 high flow and NRB mask Restrictions Weight Bearing Restrictions: No    Mobility  Bed Mobility Overal bed mobility: Needs Assistance Bed Mobility: Supine to Sit      Supine to sit: Min assist     General bed mobility comments: HOB elevated, increased time, minA for trunk elevation, pt pulled up on PT, DOE  Transfers Overall transfer level: Needs assistance Equipment used: Rolling walker (2 wheeled) Transfers: Sit to/from [facility] Sit to Stand: Min guard Stand pivot transfers: Min assist (for line management)       General transfer comment: min guard for lines and safety, increased time, pt able to complete 5 steps to chair, DOE SpO2 dec to 67% on NRB mask and 15Lo2 via [place]. RN aware and warned PT that pt will drop. S/p 5 min pt's SPo2 hit 80, per RN pt can take up to 20 min to reach 90 SPo2  Ambulation/Gait             General Gait Details: pt limited to stad pvt to chair due to DOE and poor activity tolerance due to lung condition   Stairs             Wheelchair Mobility    Modified Rankin (Stroke Patients Only)       Balance Overall balance assessment: Modified Independent Sitting-balance support: Feet supported Sitting balance-Leahy Scale: Good     Standing balance support: Bilateral upper extremity supported;During functional activity Standing balance-Leahy Scale: Fair Standing balance comment: UE support and min guard for static standing  Cognition Arousal/Alertness: Awake/alert Behavior During Therapy: WFL for tasks assessed/performed Overall Cognitive Status: Within Functional Limits for tasks assessed                                 General Comments: very pleasant and willing to move      Exercises General Exercises - Lower Extremity Ankle Circles/Pumps: AROM;Both;5 reps;Seated Long Arc Quad: AROM;Both;5 reps;Seated Hip Flexion/Marching: AROM;Both;5 reps;Seated    General Comments General comments (skin integrity, edema, etc.): per RN pt with stage 2 on sacrum      Pertinent Vitals/Pain Pain Assessment: No/denies pain Pain  Location: reports discomfort in sinuses    Home Living                      Prior Function            PT Goals (current goals can now be found in the care plan section) Acute Rehab PT Goals Patient Stated Goal: return home Progress towards PT goals: Not progressing toward goals - comment    Frequency    Min 2X/week      PT Plan Frequency needs to be updated    Co-evaluation              AM-PAC PT "6 Clicks" Mobility   Outcome Measure  Help needed turning from your back to your side while in a flat bed without using bedrails?: A Little Help needed moving from lying on your back to sitting on the side of a flat bed without using bedrails?: A Little Help needed moving to and from a bed to a chair (including a wheelchair)?: A Little Help needed standing up from a chair using your arms (e.g., wheelchair or bedside chair)?: A Little Help needed to walk in hospital room?: A Lot Help needed climbing 3-5 steps with a railing? : Total 6 Click Score: 15    End of Session Equipment Utilized During Treatment: Oxygen (NRB and 15LO2 high flow via [place]) Activity Tolerance:  (by DOE and Spo2 dropping into 60s) Patient left: in chair;with call bell/phone within reach;with nursing/sitter in room Nurse Communication: Mobility status PT Visit Diagnosis: Muscle weakness (generalized) (M62.81);Difficulty in walking, not elsewhere classified (R26.2)     Time: 0623-7628 PT Time Calculation (min) (ACUTE ONLY): 21 min  Charges:  $Therapeutic Activity: 8-22 mins                     Amanda Ross, PT, DPT Acute Rehabilitation Services Pager #: [phone] Office #: [phone]    Amanda Ross 05/01/2020, 9:34 AM

## 2020-05-01 NOTE — Progress Notes (Addendum)
PROGRESS NOTE                                                                                                                                                                                                             Patient Demographics:    Amanda Ross, is a 74 y.o. female, DOB - 11/13/1945, WUJ:[id number]  Outpatient Primary MD for the patient is [name], MD    LOS - 3  Admit date - 05/05/2020    Chief Complaint  Patient presents with  . Shortness of Breath       Brief Narrative (HPI from H&P)  -  Amanda Ross  is a 74 y.o. female, with recent treated Covid 19 PNA > 3 weeks ago, Sjogren's syndrome, hypertension, asthma, left thyroid nodule, who was discharged about 3-1/2 weeks ago after treatment of COVID-19 pneumonia presents to the ER with gradually progressive shortness of breath which started about a week after she went home, she has also noticed some swelling in her left lower extremity, also noticed a change that she has slight difficulty in laying flat at night and now using 2 pillows.  In the ER she was diagnosed with acute hypoxic respiratory failure, extremely elevated D-dimer, acute left lower extremity DVT, due to allergy to IV dye CT scan could not be done.  She also had an elevated BNP, she was diagnosed with left lower extremity DVT and admitted for further work-up for acute hypoxic respiratory failure.   Subjective:   Patient in bed appears to be in no distress, denies any headache, no chest or abdominal pain, continues to have a mild cough and shortness of breath.   Assessment  & Plan :     1. Acute Hypoxic Resp. Failure in a patient with recently treated for Severe COVID-19 pneumonia now according to CT scan seems to have incurred severe parenchymal lung injury causing her hypoxia -   She unfortunately seems to have incurred severe parenchymal lung injury from the recent COVID-19 pneumonia  causing her severe hypoxia, she was diuresed with IV Lasix for any hint of fluid overload, she is already on oral Levaquin for total of 4 days, full dose anticoagulation for DVT, IV steroids added 04/30/2020.  Continues to be severely hypoxic, case was discussed with pulmonary physician Dr. Valeta Harms who reviewed the CT scan and agreed that  patient likely has incurred life-threatening and severe parenchymal lung injury.  Steroids have been added on 04/30/2020 to see if there is any reversible component to this injury.  Continue to monitor going on heated high flow soon.  Unfortunately her prognosis is extremely guarded.    Encouraged the patient to sit up in chair in the daytime use I-S and flutter valve for pulmonary toiletry and then prone in bed when at night.  Will advance activity and titrate down oxygen as possible.  SpO2: 91 % O2 Flow Rate (L/min): 14 L/min  Recent Labs  Lab 04/26/2020 1536 05/13/2020 1712 05/14/2020 1920 04/29/20 0205 04/30/20 0313 05/01/20 0223  WBC 9.1  --   --  8.0 9.1 13.5*  HGB 12.3  --   --  11.2* 11.8* 12.4  HCT 37.9  --   --  35.1* 35.6* 36.6  PLT 155  --   --  157 207 241  CRP  --   --  10.9* 12.0* 23.6* 29.4*  BNP 592.1*  --   --  545.8* 421.2* 621.4*  DDIMER >20.00*  --   --  16.04* 4.16* 2.90*  PROCALCITON  --   --  0.13 <0.10 0.34  --   AST 19  --   --  16 15 22   ALT 24  --   --  19 18 25   ALKPHOS 86  --   --  73 91 99  BILITOT 1.0  --   --  1.3* 1.0 1.1  ALBUMIN 3.0*  --   --  2.6* 2.4* 2.4*  INR 1.2  --   --   --   --   --   LATICACIDVEN  --  1.3 1.8  --   --   --    Results for [name], [name] (MRN [id number]) as of 04/29/2020 13:43  Ref. Range 05/02/2020 15:36 04/18/2020 19:20 04/29/2020 08:26  Troponin I (High Sensitivity) Latest Ref Range: <18 ng/L 87 (H) 103 (HH) 105 (HH)      2.  Productive cough could be possible CAP versus bronchitis.  Acute infection cannot be ruled out, pending sputum Gram stain culture along with nasal MRSA PCR,  continue 4 days of Levaquin.  3.  GERD.  Continue PPI.  4. Elevated Troponin.  Likely due to demand ischemia from #1 above, trend is not an ACS pattern, echocardiogram shows a preserved EF without any wall motion abnormalities.  On Lopressor and full dose anticoagulation for DVT,  nonacute EKG and no chest pain.  5.  Morbid obesity.  BMI 37.  Follow with PCP for weight loss.  6.  Sjogren's syndrome.  Continue Plaquenil and artificial tears.  7.  OSA.  Oxygen at night.  8.  Dyslipidemia.  Continue home dose statin.  9.  Essential hypertension.  added low dose lopressor.  10.  Acute on chronic diastolic CHF at admission, EF 60%.  Has been adequately diuresed, on beta-blocker.  11. Echocardiogram also shows developing pulmonary hypertension likely again due to severe parenchymal lung injury.    Condition -   Guarded +++  Family Communication  :   Husband [name] 04/29/2020 on [phone].  Updated again in detail on 04/30/2020 including poor prognosis  Daughter Hermelinda Dellen (Daughter) [phone] updated On 04/30/20, 05/01/20 message left at 9:11 AM.    Code Status :  DNR  Consults  :  PCCM - phone, palliative care  Procedures  :    CT chest - 1. Interval development of severe bilateral  pulmonary parenchymal disease compared to the relatively recent prior imaging dated 03/25/2020. There is new significant bronchiectasis throughout the posterior aspect of the right upper lobe, the right lower lobe and within the periphery of the left upper lobe. The associated pulmonary parenchyma is nearly completely replaced with interstitial and confluent airspace opacities. Findings are most consistent with severe post COVID scarring and architectural distortion. 2. New emphysematous changes in the right upper lobe near the apex. 3. No evidence of pleural effusion or pneumothorax. 4. Increasing prominence of mediastinal lymph nodes are almost certainly reactive in nature. 5.  Enlarged main pulmonary artery suggests developing pulmonary arterial hypertension. 6. Coronary artery and aortic atherosclerotic calcifications. Aortic Atherosclerosis  Lower extremity venous duplex.  Positive for Acute DVT in the left leg.    VQ - Low probability  TTE -  1. Left ventricular ejection fraction, by estimation, is 60 to 65%. The left ventricle has normal function. The left ventricle has no regional wall motion abnormalities. There is mild left ventricular hypertrophy. Left ventricular diastolic parameters are consistent with Grade I diastolic dysfunction (impaired relaxation).  2. Right ventricular systolic function is moderately reduced. The right ventricular size is mildly enlarged. With RV free wall hypokinesis and preserved apical function, would consider PE. There is normal pulmonary artery systolic pressure. The estimated right ventricular systolic pressure is 62.2 mmHg.  3. The mitral valve is normal in structure. No evidence of mitral valve regurgitation. No evidence of mitral stenosis.  4. The aortic valve is tricuspid. Aortic valve regurgitation is not visualized. No aortic stenosis is present.  5. Aortic dilatation noted. There is mild dilatation of the ascending aorta, measuring 38 mm.  6. The inferior vena cava is normal in size with <50% respiratory variability, suggesting right atrial pressure of 8 mmHg.  7. 5 cm x 5 cm cystic structure in liver.    PUD Prophylaxis : Pepcid  Disposition Plan  :    Status is: Inpatient  Remains inpatient appropriate because:IV treatments appropriate due to intensity of illness or inability to take PO  Dispo: The patient is from: Home              Anticipated d/c is to: Home              Anticipated d/c date is: > 3 days              Patient currently is not medically stable to d/c.  DVT Prophylaxis  :   Heparin  Gtt >> Lovenox  Lab Results  Component Value Date   PLT 241 05/01/2020    Diet :  Diet Order            DIET  SOFT Room service appropriate? Yes with Assist; Fluid consistency: Thin  Diet effective now                  Inpatient Medications  Scheduled Meds: . (feeding supplement) PROSource Plus  30 mL Oral BID BM  . atorvastatin  10 mg Oral Daily  . cholecalciferol  2,000 Units Oral Daily  . enoxaparin (LOVENOX) injection  100 mg Subcutaneous Q12H  . hydroxychloroquine  200 mg Oral q morning - 10a  . levofloxacin  750 mg Oral Daily  . magnesium gluconate  500 mg Oral Daily  . methylPREDNISolone (SOLU-MEDROL) injection  60 mg Intravenous Daily  . metoprolol tartrate  25 mg Oral BID  . oxybutynin  10 mg Oral QHS  . [START ON 06-04-20] pantoprazole  80  mg Oral Daily  . traZODone  50 mg Oral QHS   Continuous Infusions:  PRN Meds:.acetaminophen **OR** [DISCONTINUED] acetaminophen, albuterol, baclofen, bisacodyl, guaiFENesin-dextromethorphan, morphine, [DISCONTINUED] ondansetron **OR** ondansetron (ZOFRAN) IV, polyvinyl alcohol, sodium chloride  Antibiotics  :    Anti-infectives (From admission, onward)   Start     Dose/Rate Route Frequency Ordered Stop   04/29/20 1000  hydroxychloroquine (PLAQUENIL) tablet 200 mg        200 mg Oral  Every morning - 10a 05/14/2020 1723     04/29/20 0600  vancomycin (VANCOREADY) IVPB 750 mg/150 mL  Status:  Discontinued        750 mg 150 mL/hr over 60 Minutes Intravenous Every 12 hours 04/20/2020 1728 04/29/20 1022   04/29/2020 1745  levofloxacin (LEVAQUIN) tablet 750 mg        750 mg Oral Daily 05/05/2020 1738 05/02/20 0959   05/03/2020 1715  ceFEPIme (MAXIPIME) 2 g in sodium chloride 0.9 % 100 mL IVPB        2 g 200 mL/hr over 30 Minutes Intravenous  Once 05/09/2020 1712 05/03/2020 1825   05/07/2020 1715  vancomycin (VANCOREADY) IVPB 2000 mg/400 mL        2,000 mg 200 mL/hr over 120 Minutes Intravenous  Once 05/17/2020 1714 04/30/2020 2054       Time Spent in minutes  30   Lala Lund M.D on 05/01/2020 at 9:03 AM  To page go to [email or web address] - password  [facility]  [facility] -  Office  [phone]    See all Orders from today for further details    Objective:   Vitals:   05/01/20 0038 05/01/20 0256 05/01/20 0637 05/01/20 0717  BP: 98/74  114/80 140/69  Pulse: 89 90 98 94  Resp: 19 20 (!) 23 (!) 23  Temp: 98.3 F (36.8 C)  98.8 F (37.1 C) 97.7 F (36.5 C)  TempSrc:   Oral Axillary  SpO2:  95% 92% 91%  Weight:      Height:        Wt Readings from Last 3 Encounters:  05/12/2020 96.1 kg  04/26/20 96.1 kg  04/11/20 96.1 kg     Intake/Output Summary (Last 24 hours) at 05/01/2020 0903 Last data filed at 05/01/2020 0500 Gross per 24 hour  Intake 691.77 ml  Output 300 ml  Net 391.77 ml     Physical Exam  Awake Alert, No new F.N deficits, Normal affect [place].AT,PERRAL Supple Neck,No JVD, No cervical lymphadenopathy appriciated.  Symmetrical Chest wall movement, coarse bilateral breath sounds. RRR,No Gallops, Rubs or new Murmurs, No Parasternal Heave +ve B.Sounds, Abd Soft, No tenderness, No organomegaly appriciated, No rebound - guarding or rigidity. No Cyanosis, left leg swollen as compared to the right    Data Review:    CBC Recent Labs  Lab 05/11/2020 1536 04/29/20 0205 04/30/20 0313 05/01/20 0223  WBC 9.1 8.0 9.1 13.5*  HGB 12.3 11.2* 11.8* 12.4  HCT 37.9 35.1* 35.6* 36.6  PLT 155 157 207 241  MCV 97.7 96.4 95.2 92.4  MCH 31.7 30.8 31.6 31.3  MCHC 32.5 31.9 33.1 33.9  RDW 19.9* 19.2* 18.5* 17.8*  LYMPHSABS 0.9 1.5 1.0 0.6*  MONOABS 1.0 1.0 1.1* 0.7  EOSABS 0.2 0.3 0.2 0.0  BASOSABS 0.1 0.0 0.0 0.0    Recent Labs  Lab 05/07/2020 1536 05/17/2020 1712 04/25/2020 1920 04/29/20 0205 04/30/20 0313 05/01/20 0223  NA 141  --   --  143 140 140  K 4.3  --   --  4.1 4.3 4.3  CL 111  --   --  108 104 103  CO2 21*  --   --  26 27 26   GLUCOSE 116*  --   --  84 102* 123*  BUN 16  --   --  14 19 21   CREATININE 1.03*  --   --  0.98 1.14* 1.06*  CALCIUM 10.8*  --   --  10.7* 10.6* 11.3*  AST 19  --   --   16 15 22   ALT 24  --   --  19 18 25   ALKPHOS 86  --   --  73 91 99  BILITOT 1.0  --   --  1.3* 1.0 1.1  ALBUMIN 3.0*  --   --  2.6* 2.4* 2.4*  MG  --   --   --  1.9 1.6* 2.2  CRP  --   --  10.9* 12.0* 23.6* 29.4*  DDIMER >20.00*  --   --  16.04* 4.16* 2.90*  PROCALCITON  --   --  0.13 <0.10 0.34  --   LATICACIDVEN  --  1.3 1.8  --   --   --   INR 1.2  --   --   --   --   --   BNP 592.1*  --   --  545.8* 421.2* 621.4*    ------------------------------------------------------------------------------------------------------------------ No results for input(s): CHOL, HDL, LDLCALC, TRIG, CHOLHDL, LDLDIRECT in the last 72 hours.  Lab Results  Component Value Date   HGBA1C 5.5 02/19/2018   ------------------------------------------------------------------------------------------------------------------ No results for input(s): TSH, T4TOTAL, T3FREE, THYROIDAB in the last 72 hours.  Invalid input(s): FREET3  Cardiac Enzymes No results for input(s): CKMB, TROPONINI, MYOGLOBIN in the last 168 hours.  Invalid input(s): CK ------------------------------------------------------------------------------------------------------------------    Component Value Date/Time   BNP 621.4 (H) 05/01/2020 8119    Micro Results Recent Results (from the past 240 hour(s))  Culture, blood (routine x 2)     Status: None (Preliminary result)   Collection Time: 04/27/2020  5:12 PM   Specimen: BLOOD  Result Value Ref Range Status   Specimen Description BLOOD BLOOD LEFT WRIST  Final   Special Requests   Final    BOTTLES DRAWN AEROBIC AND ANAEROBIC Blood Culture results may not be optimal due to an inadequate volume of blood received in culture bottles   Culture   Final    NO GROWTH 2 DAYS Performed at [facility], [place] [address] [place], [place] [address]    Report Status PENDING  Incomplete  Culture, blood (routine x 2)     Status: None (Preliminary result)   Collection Time: 04/24/2020  7:20 PM    Specimen: BLOOD  Result Value Ref Range Status   Specimen Description BLOOD RIGHT ANTECUBITAL  Final   Special Requests   Final    BOTTLES DRAWN AEROBIC ONLY Blood Culture results may not be optimal due to an inadequate volume of blood received in culture bottles   Culture   Final    NO GROWTH 2 DAYS Performed at [facility], [place] [address] [place], [place] [address]    Report Status PENDING  Incomplete  MRSA PCR Screening     Status: None   Collection Time: 04/30/20 11:20 AM   Specimen: Nasal Mucosa; Nasopharyngeal  Result Value Ref Range Status   MRSA by PCR NEGATIVE NEGATIVE Final    Comment:        The GeneXpert MRSA Assay (FDA  approved for NASAL specimens only), is one component of a comprehensive MRSA colonization surveillance program. It is not intended to diagnose MRSA infection nor to guide or monitor treatment for MRSA infections. Performed at [facility], [place] [address], [place], [place] [address]   Culture, Urine     Status: None   Collection Time: 04/30/20 11:26 AM   Specimen: Urine, Clean Catch  Result Value Ref Range Status   Specimen Description URINE, CLEAN CATCH  Final   Special Requests NONE  Final   Culture   Final    NO GROWTH Performed at [facility], [place] [address] [place], [place] [address]    Report Status 05/01/2020 FINAL  Final    Radiology Reports CT CHEST WO CONTRAST  Result Date: 04/30/2020 CLINICAL DATA:  Chest pain, shortness of breath. Recently diagnosed with COVID in October EXAM: CT CHEST WITHOUT CONTRAST TECHNIQUE: Multidetector CT imaging of the chest was performed following the standard protocol without IV contrast. COMPARISON:  Prior CT scan of the chest 03/25/2020 FINDINGS: Cardiovascular: Limited evaluation in the absence of intravenous contrast. Tortuous and atherosclerotic thoracic aorta. The main pulmonary artery is enlarged at 3.6 cm. Mild cardiomegaly. Trace pericardial effusion. Atherosclerotic  calcifications noted along the course of the left anterior descending coronary artery. Mediastinum/Nodes: Incidental note is made of a small nodule posterior the upper pole of the left thyroid gland. This measures less than 1.5 cm and therefore requires no further evaluation. Interval development of left AP window lymph nodes measuring up to 1.1 cm in short axis. Almost certainly reactive in nature given the relatively short time frame since the prior examination. Unremarkable esophagus. Lungs/Pleura: Severe interval progression of pulmonary parenchymal disease. Interval development of extensive bronchiectasis most severe in the right upper and lower lobes but also present in the periphery of the left upper lobe. Additionally, there is new emphysematous change in the right lung apex and extensive interstitial and confluent airspace opacities present throughout the portions of the lung affected with bronchiectatic changes. No pleural effusion. No pneumothorax. Upper Abdomen: No acute abnormality within the upper abdomen. Left renal and hepatic cysts again noted. Musculoskeletal: No acute fracture or aggressive appearing lytic or blastic osseous lesion. IMPRESSION: 1. Interval development of severe bilateral pulmonary parenchymal disease compared to the relatively recent prior imaging dated 03/25/2020. There is new significant bronchiectasis throughout the posterior aspect of the right upper lobe, the right lower lobe and within the periphery of the left upper lobe. The associated pulmonary parenchyma is nearly completely replaced with interstitial and confluent airspace opacities. Findings are most consistent with severe post COVID scarring and architectural distortion. 2. New emphysematous changes in the right upper lobe near the apex. 3. No evidence of pleural effusion or pneumothorax. 4. Increasing prominence of mediastinal lymph nodes are almost certainly reactive in nature. 5. Enlarged main pulmonary artery  suggests developing pulmonary arterial hypertension. 6. Coronary artery and aortic atherosclerotic calcifications. Aortic Atherosclerosis (ICD10-I70.0) and Emphysema (ICD10-J43.9). Electronically Signed   By: Jacqulynn Cadet M.D.   On: 04/30/2020 11:13   CARDIAC CATHETERIZATION  Result Date: 04/15/2020 Medical treatment for possible microvascular disease or demand ischemia. Low dose aspirin as tolerated.  NM Pulmonary Perf and Vent  Result Date: 04/29/2020 CLINICAL DATA:  74 year old female discharged from the hospital 3.5 weeks ago after being treated for COVID-19. Pneumonia gradual progressive shortness of breath, lower extremity swelling. EXAM: NUCLEAR MEDICINE PERFUSION LUNG SCAN TECHNIQUE: Perfusion images were obtained in multiple projections after intravenous injection of  radiopharmaceutical. Ventilation scans intentionally deferred if perfusion scan and chest x-ray adequate for interpretation during COVID 19 epidemic. RADIOPHARMACEUTICALS:  4.4 mCi Tc-76m MAA IV COMPARISON:  Portable chest radiograph yesterday. FINDINGS: Generalized mildly to moderately decreased perfusion radiotracer in the right lung, corresponding to asymmetric coarse interstitial opacity on that side yesterday. Comparatively normal perfusion radiotracer activity on the left. No wedge-shaped or suspicious perfusion defect identified. IMPRESSION: 1. No strong evidence of acute pulmonary embolus. 2. Generalized decreased perfusion in the right lung which demonstrated widespread abnormal opacity on portable chest x-ray yesterday. Electronically Signed   By: Genevie Ann M.D.   On: 04/29/2020 15:33   DG Chest Port 1 View  Result Date: 04/26/2020 CLINICAL DATA:  Shortness of breath EXAM: PORTABLE CHEST 1 VIEW COMPARISON:  04/03/2020, 03/31/2020 CT 03/25/2020 FINDINGS: Significant interval worsening right greater than left pulmonary airspace opacities concerning for bilateral pneumonia. Enlarged cardiomediastinal silhouette with  aortic atherosclerosis. No significant effusion. No pneumothorax. IMPRESSION: Significant interval worsening of right greater than left pulmonary airspace opacities concerning for bilateral pneumonia. Electronically Signed   By: Donavan Foil M.D.   On: 05/04/2020 17:03   DG Chest Port 1 View  Result Date: 04/03/2020 CLINICAL DATA:  Shortness of breath. EXAM: PORTABLE CHEST 1 VIEW COMPARISON:  03/31/2020 chest radiograph and prior. FINDINGS: Bilateral mid to lower lung patchy opacities are more conspicuous than prior exam. No pneumothorax or pleural effusion. Mild cardiomegaly and aortic arch atherosclerotic calcifications. Osseous structures are unchanged. IMPRESSION: Slightly increased conspicuity of bilateral pulmonary opacities concerning for multifocal pneumonia. Electronically Signed   By: Primitivo Gauze M.D.   On: 04/03/2020 08:24   ECHOCARDIOGRAM COMPLETE  Result Date: 04/29/2020    ECHOCARDIOGRAM REPORT   Patient Name:   SATOYA FEELEY Date of Exam: 04/29/2020 Medical Rec #:  [id number]         Height:       63.0 in Accession #:    [id number]        Weight:       211.9 lb Date of Birth:  February 14, 1946         BSA:          1.982 m Patient Age:    22 years          BP:           119/72 mmHg Patient Gender: F                 HR:           103 bpm. Exam Location:  Inpatient Procedure: 2D Echo, Color Doppler, Cardiac Doppler and Intracardiac            Opacification Agent Indications:    M84.13 Acute diastolic (congestive) heart failure  History:        Patient has prior history of Echocardiogram examinations, most                 recent 04/06/2020. Risk Factors:Hypertension, Dyslipidemia,                 Sleep Apnea and COVID+ 03/24/20.  Sonographer:    Raquel Sarna Senior RDCS Referring Phys: Graylin Shiver [facility]  Sonographer Comments: Technically difficult due to patient body habitus. IMPRESSIONS  1. Left ventricular ejection fraction, by estimation, is 60 to 65%. The left ventricle has normal  function. The left ventricle has no regional wall motion abnormalities. There is mild left ventricular hypertrophy. Left ventricular diastolic parameters are consistent with Grade I diastolic dysfunction (  impaired relaxation).  2. Right ventricular systolic function is moderately reduced. The right ventricular size is mildly enlarged. With RV free wall hypokinesis and preserved apical function, would consider PE. There is normal pulmonary artery systolic pressure. The estimated right ventricular systolic pressure is 86.5 mmHg.  3. The mitral valve is normal in structure. No evidence of mitral valve regurgitation. No evidence of mitral stenosis.  4. The aortic valve is tricuspid. Aortic valve regurgitation is not visualized. No aortic stenosis is present.  5. Aortic dilatation noted. There is mild dilatation of the ascending aorta, measuring 38 mm.  6. The inferior vena cava is normal in size with <50% respiratory variability, suggesting right atrial pressure of 8 mmHg.  7. 5 cm x 5 cm cystic structure in liver. FINDINGS  Left Ventricle: Left ventricular ejection fraction, by estimation, is 60 to 65%. The left ventricle has normal function. The left ventricle has no regional wall motion abnormalities. Definity contrast agent was given IV to delineate the left ventricular  endocardial borders. The left ventricular internal cavity size was normal in size. There is mild left ventricular hypertrophy. Left ventricular diastolic parameters are consistent with Grade I diastolic dysfunction (impaired relaxation). Right Ventricle: The right ventricular size is mildly enlarged. No increase in right ventricular wall thickness. Right ventricular systolic function is moderately reduced. There is normal pulmonary artery systolic pressure. The tricuspid regurgitant velocity is 2.52 m/s, and with an assumed right atrial pressure of 8 mmHg, the estimated right ventricular systolic pressure is 78.4 mmHg. Left Atrium: Left atrial size  was normal in size. Right Atrium: Right atrial size was normal in size. Pericardium: There is no evidence of pericardial effusion. Mitral Valve: The mitral valve is normal in structure. No evidence of mitral valve regurgitation. No evidence of mitral valve stenosis. Tricuspid Valve: The tricuspid valve is normal in structure. Tricuspid valve regurgitation is trivial. Aortic Valve: The aortic valve is tricuspid. Aortic valve regurgitation is not visualized. No aortic stenosis is present. Pulmonic Valve: The pulmonic valve was not well visualized. Pulmonic valve regurgitation is not visualized. Aorta: Aortic dilatation noted. There is mild dilatation of the ascending aorta, measuring 38 mm. Venous: The inferior vena cava is normal in size with less than 50% respiratory variability, suggesting right atrial pressure of 8 mmHg. IAS/Shunts: No atrial level shunt detected by color flow Doppler.  LEFT VENTRICLE PLAX 2D LVIDd:         3.20 cm  Diastology LVIDs:         1.70 cm  LV e' medial:    4.68 cm/s LV PW:         1.20 cm  LV E/e' medial:  10.2 LV IVS:        1.40 cm  LV e' lateral:   4.35 cm/s LVOT diam:     2.00 cm  LV E/e' lateral: 10.9 LV SV:         52 LV SV Index:   26 LVOT Area:     3.14 cm  RIGHT VENTRICLE RV S prime:     12.60 cm/s TAPSE (M-mode): 1.9 cm LEFT ATRIUM             Index       RIGHT ATRIUM           Index LA diam:        3.20 cm 1.61 cm/m  RA Area:     19.10 cm LA Vol (A2C):   47.4 ml 23.92 ml/m RA Volume:  49.10 ml  24.78 ml/m LA Vol (A4C):   43.1 ml 21.75 ml/m LA Biplane Vol: 45.9 ml 23.16 ml/m  AORTIC VALVE LVOT Vmax:   112.00 cm/s LVOT Vmean:  75.200 cm/s LVOT VTI:    0.167 m  AORTA Ao Root diam: 3.20 cm Ao Asc diam:  3.80 cm MITRAL VALVE               TRICUSPID VALVE MV Area (PHT): 3.76 cm    TR Peak grad:   25.4 mmHg MV Decel Time: 202 msec    TR Vmax:        252.00 cm/s MV E velocity: 47.60 cm/s MV A velocity: 92.10 cm/s  SHUNTS MV E/A ratio:  0.52        Systemic VTI:  0.17 m                             Systemic Diam: 2.00 cm Loralie Champagne MD Electronically signed by Loralie Champagne MD Signature Date/Time: 04/29/2020/4:25:58 PM    Final    VAS [place] LOWER EXTREMITY VENOUS (DVT) (ONLY [facility] & [facility])  Result Date: 04/30/2020  Lower Venous DVT Study Indications: Recent COVID infection 03/2020, now with D-dimer >20 and hypoxia.  Limitations: Poor ultrasound/tissue interface. Comparison Study: 03/26/2020 negative lower extremity venous duplex Performing Technologist: Maudry Mayhew MHA, RDMS, RVT, RDCS  Examination Guidelines: A complete evaluation includes B-mode imaging, spectral Doppler, color Doppler, and power Doppler as needed of all accessible portions of each vessel. Bilateral testing is considered an integral part of a complete examination. Limited examinations for reoccurring indications may be performed as noted. The reflux portion of the exam is performed with the patient in reverse Trendelenburg.  +---------+---------------+---------+-----------+----------+--------------+ RIGHT    CompressibilityPhasicitySpontaneityPropertiesThrombus Aging +---------+---------------+---------+-----------+----------+--------------+ CFV      Full           No       Yes                                 +---------+---------------+---------+-----------+----------+--------------+ SFJ      Full                                                        +---------+---------------+---------+-----------+----------+--------------+ FV Prox  Full                                                        +---------+---------------+---------+-----------+----------+--------------+ FV Mid   Full                                                        +---------+---------------+---------+-----------+----------+--------------+ FV DistalFull                                                        +---------+---------------+---------+-----------+----------+--------------+  PFV      Full                                                         +---------+---------------+---------+-----------+----------+--------------+ POP      Full           No       Yes                                 +---------+---------------+---------+-----------+----------+--------------+ PTV      Full                                                        +---------+---------------+---------+-----------+----------+--------------+ PERO     Full                                                        +---------+---------------+---------+-----------+----------+--------------+   +---------+---------------+---------+-----------+----------+--------------+ LEFT     CompressibilityPhasicitySpontaneityPropertiesThrombus Aging +---------+---------------+---------+-----------+----------+--------------+ CFV      Full           No       Yes                                 +---------+---------------+---------+-----------+----------+--------------+ SFJ      Full                                                        +---------+---------------+---------+-----------+----------+--------------+ FV Prox  None                                         Acute          +---------+---------------+---------+-----------+----------+--------------+ FV Mid   None                                         Acute          +---------+---------------+---------+-----------+----------+--------------+ FV DistalNone                                         Acute          +---------+---------------+---------+-----------+----------+--------------+ PFV      Full                                                        +---------+---------------+---------+-----------+----------+--------------+  POP      None                    No                   Acute          +---------+---------------+---------+-----------+----------+--------------+ PTV      None                                         Acute           +---------+---------------+---------+-----------+----------+--------------+   Left Technical Findings: Not visualized segments include peroneal veins not adequately visualized.   Summary: RIGHT: - There is no evidence of deep vein thrombosis in the lower extremity.  - No cystic structure found in the popliteal fossa.  LEFT: - Findings consistent with acute deep vein thrombosis involving the left femoral vein, left popliteal vein, and left posterior tibial veins. - No cystic structure found in the popliteal fossa.  Lower extremity venous flow is pulsatile, suggestive of possibly elevated right heart pressure.  *See table(s) above for measurements and observations. Electronically signed by Ruta Hinds MD on 04/30/2020 at 11:45:06 AM.    Final    ECHOCARDIOGRAM LIMITED  Result Date: 04/06/2020    ECHOCARDIOGRAM LIMITED REPORT   Patient Name:   NONI STONESIFER Date of Exam: 04/06/2020 Medical Rec #:  [id number]         Height:       63.0 in Accession #:    [id number]        Weight:       225.0 lb Date of Birth:  08/21/1945         BSA:          2.033 m Patient Age:    28 years          BP:           111/75 mmHg Patient Gender: F                 HR:           96 bpm. Exam Location:  Inpatient Procedure: Limited Echo, Cardiac Doppler, Color Doppler and Intracardiac            Opacification Agent Indications:    Dyspnea  History:        Patient has no prior history of Echocardiogram examinations.                 Risk Factors:Dyslipidemia and Former Smoker. COVID-19.  Sonographer:    Clayton Lefort RDCS (AE) Referring Phys: [id number] DAWOOD Graciela Husbands  Sonographer Comments: Technically challenging study due to limited acoustic windows, Technically difficult study due to poor echo windows, suboptimal parasternal window, suboptimal apical window, suboptimal subcostal window and patient is morbidly obese.  COVID-19. IMPRESSIONS  1. Left ventricular ejection fraction, by estimation, is 60 to 65%. The left ventricle has  normal function.  2. Right ventricular systolic function was not well visualized. The right ventricular size is moderately enlarged.  3. The mitral valve was not well visualized.  4. The aortic valve was not well visualized.  5. The inferior vena cava is normal in size with <50% respiratory variability, suggesting right atrial pressure of 8 mmHg. Comparison(s): No prior Echocardiogram. Conclusion(s)/Recommendation(s): Technically challenging study with limited windows. Even with use of echo contrast, able to assess normal  LVEF but limited for wall motion analysis. No significant valve disease by doppler, but not well visualized. FINDINGS  Left Ventricle: Left ventricular ejection fraction, by estimation, is 60 to 65%. The left ventricle has normal function. Definity contrast agent was given IV to delineate the left ventricular endocardial borders. Right Ventricle: The right ventricular size is moderately enlarged. Right vetricular wall thickness was not well visualized. Right ventricular systolic function was not well visualized. Left Atrium: Left atrial size was not well visualized. Right Atrium: Right atrial size was not well visualized. Pericardium: The pericardium was not well visualized. Mitral Valve: The mitral valve was not well visualized. Tricuspid Valve: The tricuspid valve is not well visualized. Aortic Valve: The aortic valve was not well visualized. Pulmonic Valve: The pulmonic valve was not well visualized. Aorta: The aortic root was not well visualized, the ascending aorta was not well visualized and the aortic arch was not well visualized. Venous: The inferior vena cava is normal in size with less than 50% respiratory variability, suggesting right atrial pressure of 8 mmHg. LEFT VENTRICLE PLAX 2D LVOT diam:     2.10 cm  Diastology LVOT Area:     3.46 cm LV e' medial:    3.37 cm/s                         LV E/e' medial:  11.1                         LV e' lateral:   4.46 cm/s                          LV E/e' lateral: 8.4  IVC IVC diam: 1.70 cm  AORTA Ao Root diam: 3.30 cm Ao Asc diam:  3.30 cm MITRAL VALVE MV Area (PHT): 3.68 cm    SHUNTS MV Decel Time: 206 msec    Systemic Diam: 2.10 cm MV E velocity: 37.30 cm/s MV A velocity: 72.40 cm/s MV E/A ratio:  0.52 Buford Dresser MD Electronically signed by Buford Dresser MD Signature Date/Time: 04/06/2020/2:33:32 PM    Final

## 2020-05-02 ENCOUNTER — Telehealth: Payer: Medicare Other

## 2020-05-02 ENCOUNTER — Ambulatory Visit: Payer: Self-pay

## 2020-05-02 DIAGNOSIS — G894 Chronic pain syndrome: Secondary | ICD-10-CM

## 2020-05-02 DIAGNOSIS — M3509 Sicca syndrome with other organ involvement: Secondary | ICD-10-CM

## 2020-05-02 DIAGNOSIS — Z515 Encounter for palliative care: Secondary | ICD-10-CM | POA: Diagnosis not present

## 2020-05-02 DIAGNOSIS — J9601 Acute respiratory failure with hypoxia: Secondary | ICD-10-CM | POA: Diagnosis not present

## 2020-05-02 DIAGNOSIS — I129 Hypertensive chronic kidney disease with stage 1 through stage 4 chronic kidney disease, or unspecified chronic kidney disease: Secondary | ICD-10-CM

## 2020-05-02 DIAGNOSIS — U071 COVID-19: Secondary | ICD-10-CM

## 2020-05-02 DIAGNOSIS — Z6841 Body Mass Index (BMI) 40.0 and over, adult: Secondary | ICD-10-CM

## 2020-05-02 DIAGNOSIS — N1831 Chronic kidney disease, stage 3a: Secondary | ICD-10-CM

## 2020-05-02 LAB — CBC WITH DIFFERENTIAL/PLATELET
Abs Immature Granulocytes: 0.16 10*3/uL — ABNORMAL HIGH (ref 0.00–0.07)
Basophils Absolute: 0 10*3/uL (ref 0.0–0.1)
Basophils Relative: 0 %
Eosinophils Absolute: 0 10*3/uL (ref 0.0–0.5)
Eosinophils Relative: 0 %
HCT: 38.3 % (ref 36.0–46.0)
Hemoglobin: 12.7 g/dL (ref 12.0–15.0)
Immature Granulocytes: 1 %
Lymphocytes Relative: 5 %
Lymphs Abs: 0.7 10*3/uL (ref 0.7–4.0)
MCH: 31.5 pg (ref 26.0–34.0)
MCHC: 33.2 g/dL (ref 30.0–36.0)
MCV: 95 fL (ref 80.0–100.0)
Monocytes Absolute: 1 10*3/uL (ref 0.1–1.0)
Monocytes Relative: 6 %
Neutro Abs: 14.4 10*3/uL — ABNORMAL HIGH (ref 1.7–7.7)
Neutrophils Relative %: 88 %
Platelets: 357 10*3/uL (ref 150–400)
RBC: 4.03 MIL/uL (ref 3.87–5.11)
RDW: 18.5 % — ABNORMAL HIGH (ref 11.5–15.5)
WBC: 16.4 10*3/uL — ABNORMAL HIGH (ref 4.0–10.5)
nRBC: 0 % (ref 0.0–0.2)

## 2020-05-02 LAB — COMPREHENSIVE METABOLIC PANEL
ALT: 75 U/L — ABNORMAL HIGH (ref 0–44)
AST: 58 U/L — ABNORMAL HIGH (ref 15–41)
Albumin: 2.4 g/dL — ABNORMAL LOW (ref 3.5–5.0)
Alkaline Phosphatase: 107 U/L (ref 38–126)
Anion gap: 11 (ref 5–15)
BUN: 39 mg/dL — ABNORMAL HIGH (ref 8–23)
CO2: 29 mmol/L (ref 22–32)
Calcium: 12 mg/dL — ABNORMAL HIGH (ref 8.9–10.3)
Chloride: 100 mmol/L (ref 98–111)
Creatinine, Ser: 1.32 mg/dL — ABNORMAL HIGH (ref 0.44–1.00)
GFR, Estimated: 42 mL/min — ABNORMAL LOW (ref >60)
Glucose, Bld: 157 mg/dL — ABNORMAL HIGH (ref 70–99)
Potassium: 4.5 mmol/L (ref 3.5–5.1)
Sodium: 140 mmol/L (ref 135–145)
Total Bilirubin: 1 mg/dL (ref 0.3–1.2)
Total Protein: 6.3 g/dL — ABNORMAL LOW (ref 6.5–8.1)

## 2020-05-02 LAB — PROCALCITONIN: Procalcitonin: 0.45 ng/mL

## 2020-05-02 LAB — D-DIMER, QUANTITATIVE: D-Dimer, Quant: 1.92 ug/mL-FEU — ABNORMAL HIGH (ref 0.00–0.50)

## 2020-05-02 LAB — EXPECTORATED SPUTUM ASSESSMENT W GRAM STAIN, RFLX TO RESP C

## 2020-05-02 LAB — MAGNESIUM: Magnesium: 2.4 mg/dL (ref 1.7–2.4)

## 2020-05-02 LAB — BRAIN NATRIURETIC PEPTIDE: B Natriuretic Peptide: 418.6 pg/mL — ABNORMAL HIGH (ref 0.0–100.0)

## 2020-05-02 LAB — C-REACTIVE PROTEIN: CRP: 16.7 mg/dL — ABNORMAL HIGH (ref <1.0)

## 2020-05-02 MED ORDER — FUROSEMIDE 10 MG/ML IJ SOLN
40.0000 mg | Freq: Once | INTRAMUSCULAR | Status: AC
  Administered 2020-05-02: 40 mg via INTRAVENOUS
  Filled 2020-05-02: qty 4

## 2020-05-02 MED ORDER — LEVOFLOXACIN 500 MG PO TABS
500.0000 mg | ORAL_TABLET | Freq: Every day | ORAL | Status: AC
  Administered 2020-05-02 – 2020-05-03 (×2): 500 mg via ORAL
  Filled 2020-05-02 (×2): qty 1

## 2020-05-02 NOTE — Progress Notes (Signed)
Daily Progress Note   Patient Name: Amanda Ross       Date: 05/02/2020 DOB: [birth year]  Age: 74 y.o. MRN#: [id number] Attending Physician: Thurnell Lose, MD Primary Care Physician: [name], MD Admit Date: 04/27/2020  Reason for Consultation/Follow-up:  To discuss complex medical decision making related to patient's goals of care  Subjective: Met patient at bedside.  She is on 45L of high flow nasal canula with a NRB mask.  She is alert oriented and pleasant.  I expressed that we are worried about her lungs.  She expressed understanding.  My colleague indicated that the children were coming from [place] today and the family would like to meet.  I called Mr. Foxworthy to determine a time.  He mentioned that the children are driving today.  I suggested we meet tomorrow.    Left my card at bedside for Mr. Shedrick to let me know when it is convenient for family to meet.   Assessment: Patient post COVID with refractory hypoxic respiratory failure.   Patient Profile/HPI:  Per intake H&P -->Amanda Ross is a 74 y.o. female, with recent treated Covid 19 PNA >3 weeks ago, Sjogren's syndrome, hypertension, asthma, left thyroid nodule,diastolic heart failure. Shedischarged about 3-1/2 weeks ago after treatment of COVID-19 pneumonia now hasprogressive shortness of breath which started about a week after she went home. Identified to havesignificant parenchymal lung injuryd/t COVID-19 has a very poor prognosis.    Length of Stay: 4   Vital Signs: BP (!) 122/92 (BP Location: Right Arm)   Pulse 86   Temp 98.9 F (37.2 C) (Oral)   Resp 20   Ht _0  (1.6 m)   Wt 96.1 kg   SpO2 91%   BMI 37.53 kg/m  SpO2: SpO2: 91 % O2 Device: O2 Device: High Flow Nasal Cannula (HHFNC) O2  Flow Rate: O2 Flow Rate (L/min): 40 L/min       Palliative Assessment/Data: 30%     Palliative Care Plan    Recommendations/Plan:  Patient's children are driving from [place] today.  Will plan to have a family meeting tomorrow.  Code Status:  DNR  Prognosis:   < 2 weeks   Discharge Planning:  Anticipated Hospital Death   Thank you for allowing the Palliative Medicine Team to assist in the care of this patient.  Total time spent:  25 min.     Greater than 50%  of this time was spent counseling and coordinating care related to the above assessment and plan.  Florentina Jenny, PA-C Palliative Medicine  Please contact Palliative MedicineTeam phone at [phone] for questions and concerns between 7 am - 7 pm.   Please see AMION for individual provider pager numbers.

## 2020-05-02 NOTE — Chronic Care Management (AMB) (Signed)
  Chronic Care Management   Note  05/02/2020 Name: DELORSE SHANE MRN: [id number] DOB: 11/22/1945    04/29/2020 Name: LADASHIA DEMARINIS         MRN: [id number]       DOB: 09/11/1945  SHIREEN RAYBURN is a 74 y.o. year old female who is a primary care patient of [name], MD. ZOLLIE CLEMENCE is actively engaged with the embedded care management team in the primary care practice and is being followed by RN Case Manager and BSW for assistance with disease management and care coordination needs related to CKD Stage III and Hypertensive Nephropathy.   TECKLA CHRISTIANSEN is currently admitted to the hospital for evaluation and treatment of acute hypoxemic respiratory failure.   Plan:CM team will collaborate with [facility] and will follow patient post discharge.   Barb Merino, RN, BSN, CCM Care Management Coordinator [facility]  Direct Phone: [phone]

## 2020-05-02 NOTE — Progress Notes (Addendum)
PROGRESS NOTE                                                                                                                                                                                                             Patient Demographics:    Amanda Ross, is a 74 y.o. female, DOB - 1945-08-18, UXN:[id number]  Outpatient Primary MD for the patient is [name], MD    LOS - 4  Admit date - 05/02/2020    Chief Complaint  Patient presents with  . Shortness of Breath       Brief Narrative (HPI from H&P)  -  Amanda Ross  is a 74 y.o. female, with recent treated Covid 19 PNA > 3 weeks ago, Sjogren's syndrome, hypertension, asthma, left thyroid nodule, who was discharged about 3-1/2 weeks ago after treatment of COVID-19 pneumonia presents to the ER with gradually progressive shortness of breath which started about a week after she went home, she has also noticed some swelling in her left lower extremity, also noticed a change that she has slight difficulty in laying flat at night and now using 2 pillows.  In the ER she was diagnosed with acute hypoxic respiratory failure, extremely elevated D-dimer, acute left lower extremity DVT, due to allergy to IV dye CT scan could not be done.  She also had an elevated BNP, she was diagnosed with left lower extremity DVT and admitted for further work-up for acute hypoxic respiratory failure.   Subjective:   Patient in bed, appears comfortable, denies any headache, no fever, no chest pain or pressure, +ve shortness of breath , no abdominal pain. No focal weakness.   Assessment  & Plan :     1. Acute Hypoxic Resp. Failure in a patient with recently treated for Severe COVID-19 pneumonia now according to CT scan seems to have incurred severe parenchymal lung injury causing her hypoxia -   She unfortunately seems to have incurred severe parenchymal lung injury from the recent COVID-19  pneumonia causing her severe hypoxia, she was diuresed with IV Lasix for any hint of fluid overload, she is already on oral Levaquin for total of 6 days, full dose anticoagulation for DVT, IV steroids added 04/30/2020.   Unfortunately she continues to be severely hypoxic, case was discussed with pulmonary physician Dr. Valeta Harms who reviewed the CT scan and  agreed that patient likely has incurred life-threatening and severe parenchymal lung injury.  So far little to no clinical improvement, likely this injury is not reversible.  Continue to monitor going on heated high flow.  Her prognosis is extremely guarded.    I have discussed her grave prognosis with patient's daughter Sharyn Lull and husband multiple times, patient is now DNR, we will continue supportive care till family arrives and thereafter most likely transition to comfort measures.  SpO2: 91 % O2 Flow Rate (L/min): 40 L/min FiO2 (%): 100 %  Recent Labs  Lab 05/17/2020 1536 04/20/2020 1712 04/27/2020 1920 04/29/20 0205 04/30/20 0313 05/01/20 0223 05/02/20 0328 05/02/20 0331  WBC 9.1  --   --  8.0 9.1 13.5* 16.4*  --   HGB 12.3  --   --  11.2* 11.8* 12.4 12.7  --   HCT 37.9  --   --  35.1* 35.6* 36.6 38.3  --   PLT 155  --   --  157 207 241 357  --   CRP  --   --  10.9* 12.0* 23.6* 29.4* 16.7*  --   BNP 592.1*  --   --  545.8* 421.2* 621.4*  --  418.6*  DDIMER >20.00*  --   --  16.04* 4.16* 2.90* 1.92*  --   PROCALCITON  --   --  0.13 <0.10 0.34  --   --   --   AST 19  --   --  16 15 22  58*  --   ALT 24  --   --  19 18 25  75*  --   ALKPHOS 86  --   --  73 91 99 107  --   BILITOT 1.0  --   --  1.3* 1.0 1.1 1.0  --   ALBUMIN 3.0*  --   --  2.6* 2.4* 2.4* 2.4*  --   INR 1.2  --   --   --   --   --   --   --   LATICACIDVEN  --  1.3 1.8  --   --   --   --   --    Results for Amanda Ross, Amanda Ross (MRN [id number]) as of 04/29/2020 13:43  Ref. Range 04/19/2020 15:36 04/23/2020 19:20 04/29/2020 08:26  Troponin I (High Sensitivity) Latest Ref  Range: <18 ng/L 87 (H) 103 (HH) 105 (HH)      2.  Productive cough could be possible CAP versus bronchitis.  Acute infection cannot be ruled out, negative nasal MRSA PCR, sputum Gram stain and culture prelim report noted, continue 6 days of Levaquin.  3.  GERD.  Continue PPI.  4. Elevated Troponin.  Likely due to demand ischemia from #1 above, trend is not an ACS pattern, echocardiogram shows a preserved EF without any wall motion abnormalities.  On Lopressor and full dose anticoagulation for DVT,  nonacute EKG and no chest pain.  5.  Morbid obesity.  BMI 37.  Follow with PCP for weight loss.  6.  Sjogren's syndrome.  Continue Plaquenil and artificial tears.  7.  OSA.  Oxygen at night.  8.  Dyslipidemia.  Continue home dose statin.  9.  Essential hypertension.  added low dose lopressor.  10.  Acute on chronic diastolic CHF at admission, EF 60%.  Has been adequately diuresed, on beta-blocker.  11. Echocardiogram also shows developing pulmonary hypertension likely again due to severe parenchymal lung injury.    Condition -   Guarded +++  Family  Communication  :   Husband [name] 04/29/2020 on [phone].  Updated again in detail on 04/30/2020 including poor prognosis, 05/02/20  Daughter Hermelinda Dellen (Daughter) [phone] updated On 04/30/20, 05/01/20 message left at 9:11 AM. 05/02/20    Code Status :  DNR  Consults  :  PCCM - phone, palliative care  Procedures  :    CT chest - 1. Interval development of severe bilateral pulmonary parenchymal disease compared to the relatively recent prior imaging dated 03/25/2020. There is new significant bronchiectasis throughout the posterior aspect of the right upper lobe, the right lower lobe and within the periphery of the left upper lobe. The associated pulmonary parenchyma is nearly completely replaced with interstitial and confluent airspace opacities. Findings are most consistent with severe post COVID scarring and  architectural distortion. 2. New emphysematous changes in the right upper lobe near the apex. 3. No evidence of pleural effusion or pneumothorax. 4. Increasing prominence of mediastinal lymph nodes are almost certainly reactive in nature. 5. Enlarged main pulmonary artery suggests developing pulmonary arterial hypertension. 6. Coronary artery and aortic atherosclerotic calcifications. Aortic Atherosclerosis  Lower extremity venous duplex.  Positive for Acute DVT in the left leg.    VQ - Low probability  TTE -  1. Left ventricular ejection fraction, by estimation, is 60 to 65%. The left ventricle has normal function. The left ventricle has no regional wall motion abnormalities. There is mild left ventricular hypertrophy. Left ventricular diastolic parameters are consistent with Grade I diastolic dysfunction (impaired relaxation).  2. Right ventricular systolic function is moderately reduced. The right ventricular size is mildly enlarged. With RV free wall hypokinesis and preserved apical function, would consider PE. There is normal pulmonary artery systolic pressure. The estimated right ventricular systolic pressure is 88.5 mmHg.  3. The mitral valve is normal in structure. No evidence of mitral valve regurgitation. No evidence of mitral stenosis.  4. The aortic valve is tricuspid. Aortic valve regurgitation is not visualized. No aortic stenosis is present.  5. Aortic dilatation noted. There is mild dilatation of the ascending aorta, measuring 38 mm.  6. The inferior vena cava is normal in size with <50% respiratory variability, suggesting right atrial pressure of 8 mmHg.  7. 5 cm x 5 cm cystic structure in liver.    PUD Prophylaxis : Pepcid  Disposition Plan  :    Status is: Inpatient  Remains inpatient appropriate because:IV treatments appropriate due to intensity of illness or inability to take PO  Dispo: The patient is from: Home              Anticipated d/c is to: Home              Anticipated  d/c date is: > 3 days              Patient currently is not medically stable to d/c.  DVT Prophylaxis  :   Heparin  Gtt >> Lovenox  Lab Results  Component Value Date   PLT 357 05/02/2020    Diet :  Diet Order            DIET SOFT Room service appropriate? Yes with Assist; Fluid consistency: Thin  Diet effective now                  Inpatient Medications  Scheduled Meds: . (feeding supplement) PROSource Plus  30 mL Oral BID BM  . atorvastatin  10 mg Oral Daily  . cholecalciferol  2,000 Units Oral Daily  .  enoxaparin (LOVENOX) injection  100 mg Subcutaneous Q12H  . furosemide  40 mg Intravenous Once  . hydroxychloroquine  200 mg Oral q morning - 10a  . levofloxacin  500 mg Oral Daily  . magnesium gluconate  500 mg Oral Daily  . methylPREDNISolone (SOLU-MEDROL) injection  60 mg Intravenous Daily  . metoprolol tartrate  25 mg Oral BID  . oxybutynin  10 mg Oral QHS  . [START ON 22-May-2020] pantoprazole  80 mg Oral Daily  . traZODone  50 mg Oral QHS   Continuous Infusions:  PRN Meds:.acetaminophen **OR** [DISCONTINUED] acetaminophen, albuterol, baclofen, bisacodyl, guaiFENesin-dextromethorphan, morphine, [DISCONTINUED] ondansetron **OR** ondansetron (ZOFRAN) IV, polyvinyl alcohol, sodium chloride  Antibiotics  :    Anti-infectives (From admission, onward)   Start     Dose/Rate Route Frequency Ordered Stop   05/02/20 1000  levofloxacin (LEVAQUIN) tablet 500 mg        500 mg Oral Daily 05/02/20 0729 05/04/20 0959   04/29/20 1000  hydroxychloroquine (PLAQUENIL) tablet 200 mg        200 mg Oral  Every morning - 10a 04/22/2020 1723     04/29/20 0600  vancomycin (VANCOREADY) IVPB 750 mg/150 mL  Status:  Discontinued        750 mg 150 mL/hr over 60 Minutes Intravenous Every 12 hours 05/15/2020 1728 04/29/20 1022   04/20/2020 1745  levofloxacin (LEVAQUIN) tablet 750 mg        750 mg Oral Daily 04/25/2020 1738 05/02/20 0959   05/17/2020 1715  ceFEPIme (MAXIPIME) 2 g in sodium chloride  0.9 % 100 mL IVPB        2 g 200 mL/hr over 30 Minutes Intravenous  Once 05/12/2020 1712 05/07/2020 1825   04/30/2020 1715  vancomycin (VANCOREADY) IVPB 2000 mg/400 mL        2,000 mg 200 mL/hr over 120 Minutes Intravenous  Once 04/27/2020 1714 05/09/2020 2054       Time Spent in minutes  30   Lala Lund M.D on 05/02/2020 at 10:28 AM  To page go to [email or web address] - password [facility]  [facility] -  Office  [phone]    See all Orders from today for further details    Objective:   Vitals:   05/02/20 0102 05/02/20 0248 05/02/20 0506 05/02/20 0810  BP: 115/79  (!) 122/92   Pulse: 92 87 90 86  Resp: (!) 21 (!) 21 20 20   Temp: 98.1 F (36.7 C)  98.9 F (37.2 C)   TempSrc: Oral  Oral   SpO2: 95% 92% 95% 91%  Weight:      Height:        Wt Readings from Last 3 Encounters:  05/02/2020 96.1 kg  04/26/20 96.1 kg  04/11/20 96.1 kg     Intake/Output Summary (Last 24 hours) at 05/02/2020 1028 Last data filed at 05/02/2020 0507 Gross per 24 hour  Intake --  Output 601 ml  Net -601 ml     Physical Exam  Awake Alert, No new F.N deficits, Normal affect [place].AT,PERRAL Supple Neck,No JVD, No cervical lymphadenopathy appriciated.  Symmetrical Chest wall movement, coarse B sounds with few rales RRR,No Gallops, Rubs or new Murmurs, No Parasternal Heave +ve B.Sounds, Abd Soft, No tenderness, No organomegaly appriciated, No rebound - guarding or rigidity. No Cyanosis,  left leg swollen as compared to the right    Data Review:    CBC Recent Labs  Lab 05/05/2020 1536 04/29/20 0205 04/30/20 0313 05/01/20 0223 05/02/20 0328  WBC 9.1 8.0 9.1  13.5* 16.4*  HGB 12.3 11.2* 11.8* 12.4 12.7  HCT 37.9 35.1* 35.6* 36.6 38.3  PLT 155 157 207 241 357  MCV 97.7 96.4 95.2 92.4 95.0  MCH 31.7 30.8 31.6 31.3 31.5  MCHC 32.5 31.9 33.1 33.9 33.2  RDW 19.9* 19.2* 18.5* 17.8* 18.5*  LYMPHSABS 0.9 1.5 1.0 0.6* 0.7  MONOABS 1.0 1.0 1.1* 0.7 1.0  EOSABS 0.2 0.3 0.2 0.0 0.0  BASOSABS 0.1  0.0 0.0 0.0 0.0    Recent Labs  Lab 04/20/2020 1536 04/22/2020 1712 04/22/2020 1920 04/29/20 0205 04/30/20 0313 05/01/20 0223 05/02/20 0328 05/02/20 0331  NA 141  --   --  143 140 140 140  --   K 4.3  --   --  4.1 4.3 4.3 4.5  --   CL 111  --   --  108 104 103 100  --   CO2 21*  --   --  26 27 26 29   --   GLUCOSE 116*  --   --  84 102* 123* 157*  --   BUN 16  --   --  14 19 21  39*  --   CREATININE 1.03*  --   --  0.98 1.14* 1.06* 1.32*  --   CALCIUM 10.8*  --   --  10.7* 10.6* 11.3* 12.0*  --   AST 19  --   --  16 15 22  58*  --   ALT 24  --   --  19 18 25  75*  --   ALKPHOS 86  --   --  73 91 99 107  --   BILITOT 1.0  --   --  1.3* 1.0 1.1 1.0  --   ALBUMIN 3.0*  --   --  2.6* 2.4* 2.4* 2.4*  --   MG  --   --   --  1.9 1.6* 2.2 2.4  --   CRP  --   --  10.9* 12.0* 23.6* 29.4* 16.7*  --   DDIMER >20.00*  --   --  16.04* 4.16* 2.90* 1.92*  --   PROCALCITON  --   --  0.13 <0.10 0.34  --   --   --   LATICACIDVEN  --  1.3 1.8  --   --   --   --   --   INR 1.2  --   --   --   --   --   --   --   BNP 592.1*  --   --  545.8* 421.2* 621.4*  --  418.6*    ------------------------------------------------------------------------------------------------------------------ No results for input(s): CHOL, HDL, LDLCALC, TRIG, CHOLHDL, LDLDIRECT in the last 72 hours.  Lab Results  Component Value Date   HGBA1C 5.5 02/19/2018   ------------------------------------------------------------------------------------------------------------------ No results for input(s): TSH, T4TOTAL, T3FREE, THYROIDAB in the last 72 hours.  Invalid input(s): FREET3  Cardiac Enzymes No results for input(s): CKMB, TROPONINI, MYOGLOBIN in the last 168 hours.  Invalid input(s): CK ------------------------------------------------------------------------------------------------------------------    Component Value Date/Time   BNP 418.6 (H) 05/02/2020 0331    Micro Results Recent Results (from the past 240 hour(s))   Culture, blood (routine x 2)     Status: None (Preliminary result)   Collection Time: 04/21/2020  5:12 PM   Specimen: BLOOD  Result Value Ref Range Status   Specimen Description BLOOD BLOOD LEFT WRIST  Final   Special Requests   Final    BOTTLES DRAWN AEROBIC AND ANAEROBIC Blood Culture results  may not be optimal due to an inadequate volume of blood received in culture bottles   Culture   Final    NO GROWTH 3 DAYS Performed at [facility], [place] [address] [place], [place] [address]    Report Status PENDING  Incomplete  Culture, blood (routine x 2)     Status: None (Preliminary result)   Collection Time: 04/24/2020  7:20 PM   Specimen: BLOOD  Result Value Ref Range Status   Specimen Description BLOOD RIGHT ANTECUBITAL  Final   Special Requests   Final    BOTTLES DRAWN AEROBIC ONLY Blood Culture results may not be optimal due to an inadequate volume of blood received in culture bottles   Culture   Final    NO GROWTH 3 DAYS Performed at [facility], [place] [address] [place], [place] [address]    Report Status PENDING  Incomplete  MRSA PCR Screening     Status: None   Collection Time: 04/30/20 11:20 AM   Specimen: Nasal Mucosa; Nasopharyngeal  Result Value Ref Range Status   MRSA by PCR NEGATIVE NEGATIVE Final    Comment:        The GeneXpert MRSA Assay (FDA approved for NASAL specimens only), is one component of a comprehensive MRSA colonization surveillance program. It is not intended to diagnose MRSA infection nor to guide or monitor treatment for MRSA infections. Performed at [facility], [place] [address] [place], [place] [address]   Culture, Urine     Status: None   Collection Time: 04/30/20 11:26 AM   Specimen: Urine, Clean Catch  Result Value Ref Range Status   Specimen Description URINE, CLEAN CATCH  Final   Special Requests NONE  Final   Culture   Final    NO GROWTH Performed at [facility], [place] [address], [place], [place] [address]     Report Status 05/01/2020 FINAL  Final  Expectorated sputum assessment w rflx to resp cult     Status: None   Collection Time: 05/01/20  8:39 PM   Specimen: Sputum  Result Value Ref Range Status   Specimen Description SPUTUM  Final   Special Requests NONE  Final   Sputum evaluation   Final    THIS SPECIMEN IS ACCEPTABLE FOR SPUTUM CULTURE Performed at [facility], [place] [address], [place], [place] [address]    Report Status 05/02/2020 FINAL  Final  Culture, respiratory     Status: None (Preliminary result)   Collection Time: 05/01/20  8:39 PM   Specimen: SPU  Result Value Ref Range Status   Specimen Description SPUTUM  Final   Special Requests NONE Reflexed from [id number]  Final   Gram Stain   Final    FEW WBC PRESENT, PREDOMINANTLY PMN MODERATE YEAST MODERATE GRAM POSITIVE COCCI IN PAIRS IN CLUSTERS FEW GRAM NEGATIVE RODS Performed at [facility], [place] [address] [place], [place] [address]    Culture PENDING  Incomplete   Report Status PENDING  Incomplete    Radiology Reports  CT CHEST WO CONTRAST  Result Date: 04/30/2020 CLINICAL DATA:  Chest pain, shortness of breath. Recently diagnosed with COVID in October EXAM: CT CHEST WITHOUT CONTRAST TECHNIQUE: Multidetector CT imaging of the chest was performed following the standard protocol without IV contrast. COMPARISON:  Prior CT scan of the chest 03/25/2020 FINDINGS: Cardiovascular: Limited evaluation in the absence of intravenous contrast. Tortuous and atherosclerotic thoracic aorta. The main pulmonary artery is enlarged  at 3.6 cm. Mild cardiomegaly. Trace pericardial effusion. Atherosclerotic calcifications noted along the course of the left anterior descending coronary artery. Mediastinum/Nodes: Incidental note is made of a small nodule posterior the upper pole of the left thyroid gland. This measures less than 1.5 cm and therefore requires no further evaluation. Interval development of left AP window lymph nodes  measuring up to 1.1 cm in short axis. Almost certainly reactive in nature given the relatively short time frame since the prior examination. Unremarkable esophagus. Lungs/Pleura: Severe interval progression of pulmonary parenchymal disease. Interval development of extensive bronchiectasis most severe in the right upper and lower lobes but also present in the periphery of the left upper lobe. Additionally, there is new emphysematous change in the right lung apex and extensive interstitial and confluent airspace opacities present throughout the portions of the lung affected with bronchiectatic changes. No pleural effusion. No pneumothorax. Upper Abdomen: No acute abnormality within the upper abdomen. Left renal and hepatic cysts again noted. Musculoskeletal: No acute fracture or aggressive appearing lytic or blastic osseous lesion. IMPRESSION: 1. Interval development of severe bilateral pulmonary parenchymal disease compared to the relatively recent prior imaging dated 03/25/2020. There is new significant bronchiectasis throughout the posterior aspect of the right upper lobe, the right lower lobe and within the periphery of the left upper lobe. The associated pulmonary parenchyma is nearly completely replaced with interstitial and confluent airspace opacities. Findings are most consistent with severe post COVID scarring and architectural distortion. 2. New emphysematous changes in the right upper lobe near the apex. 3. No evidence of pleural effusion or pneumothorax. 4. Increasing prominence of mediastinal lymph nodes are almost certainly reactive in nature. 5. Enlarged main pulmonary artery suggests developing pulmonary arterial hypertension. 6. Coronary artery and aortic atherosclerotic calcifications. Aortic Atherosclerosis (ICD10-I70.0) and Emphysema (ICD10-J43.9). Electronically Signed   By: Jacqulynn Cadet M.D.   On: 04/30/2020 11:13   CARDIAC CATHETERIZATION  Result Date: 04/15/2020 Medical treatment for  possible microvascular disease or demand ischemia. Low dose aspirin as tolerated.  NM Pulmonary Perf and Vent  Result Date: 04/29/2020 CLINICAL DATA:  74 year old female discharged from the hospital 3.5 weeks ago after being treated for COVID-19. Pneumonia gradual progressive shortness of breath, lower extremity swelling. EXAM: NUCLEAR MEDICINE PERFUSION LUNG SCAN TECHNIQUE: Perfusion images were obtained in multiple projections after intravenous injection of radiopharmaceutical. Ventilation scans intentionally deferred if perfusion scan and chest x-ray adequate for interpretation during COVID 19 epidemic. RADIOPHARMACEUTICALS:  4.4 mCi Tc-25m MAA IV COMPARISON:  Portable chest radiograph yesterday. FINDINGS: Generalized mildly to moderately decreased perfusion radiotracer in the right lung, corresponding to asymmetric coarse interstitial opacity on that side yesterday. Comparatively normal perfusion radiotracer activity on the left. No wedge-shaped or suspicious perfusion defect identified. IMPRESSION: 1. No strong evidence of acute pulmonary embolus. 2. Generalized decreased perfusion in the right lung which demonstrated widespread abnormal opacity on portable chest x-ray yesterday. Electronically Signed   By: Genevie Ann M.D.   On: 04/29/2020 15:33   DG Chest Port 1 View  Result Date: 05/04/2020 CLINICAL DATA:  Shortness of breath EXAM: PORTABLE CHEST 1 VIEW COMPARISON:  04/03/2020, 03/31/2020 CT 03/25/2020 FINDINGS: Significant interval worsening right greater than left pulmonary airspace opacities concerning for bilateral pneumonia. Enlarged cardiomediastinal silhouette with aortic atherosclerosis. No significant effusion. No pneumothorax. IMPRESSION: Significant interval worsening of right greater than left pulmonary airspace opacities concerning for bilateral pneumonia. Electronically Signed   By: Donavan Foil M.D.   On: 04/18/2020 17:03   DG Chest [facility]  Result Date: 04/03/2020 CLINICAL  DATA:  Shortness of breath. EXAM: PORTABLE CHEST 1 VIEW COMPARISON:  03/31/2020 chest radiograph and prior. FINDINGS: Bilateral mid to lower lung patchy opacities are more conspicuous than prior exam. No pneumothorax or pleural effusion. Mild cardiomegaly and aortic arch atherosclerotic calcifications. Osseous structures are unchanged. IMPRESSION: Slightly increased conspicuity of bilateral pulmonary opacities concerning for multifocal pneumonia. Electronically Signed   By: Primitivo Gauze M.D.   On: 04/03/2020 08:24   ECHOCARDIOGRAM COMPLETE  Result Date: 04/29/2020    ECHOCARDIOGRAM REPORT   Patient Name:   Amanda Ross Date of Exam: 04/29/2020 Medical Rec #:  [id number]         Height:       63.0 in Accession #:    [id number]        Weight:       211.9 lb Date of Birth:  1946-03-29         BSA:          1.982 m Patient Age:    85 years          BP:           119/72 mmHg Patient Gender: F                 HR:           103 bpm. Exam Location:  Inpatient Procedure: 2D Echo, Color Doppler, Cardiac Doppler and Intracardiac            Opacification Agent Indications:    M08.67 Acute diastolic (congestive) heart failure  History:        Patient has prior history of Echocardiogram examinations, most                 recent 04/06/2020. Risk Factors:Hypertension, Dyslipidemia,                 Sleep Apnea and COVID+ 03/24/20.  Sonographer:    Raquel Sarna Senior RDCS Referring Phys: [facility] Sh[facility]  Sonographer Comments: Technically difficult due to patient body habitus. IMPRESSIONS  1. Left ventricular ejection fraction, by estimation, is 60 to 65%. The left ventricle has normal function. The left ventricle has no regional wall motion abnormalities. There is mild left ventricular hypertrophy. Left ventricular diastolic parameters are consistent with Grade I diastolic dysfunction (impaired relaxation).  2. Right ventricular systolic function is moderately reduced. The right ventricular size is mildly enlarged.  With RV free wall hypokinesis and preserved apical function, would consider PE. There is normal pulmonary artery systolic pressure. The estimated right ventricular systolic pressure is 61.9 mmHg.  3. The mitral valve is normal in structure. No evidence of mitral valve regurgitation. No evidence of mitral stenosis.  4. The aortic valve is tricuspid. Aortic valve regurgitation is not visualized. No aortic stenosis is present.  5. Aortic dilatation noted. There is mild dilatation of the ascending aorta, measuring 38 mm.  6. The inferior vena cava is normal in size with <50% respiratory variability, suggesting right atrial pressure of 8 mmHg.  7. 5 cm x 5 cm cystic structure in liver. FINDINGS  Left Ventricle: Left ventricular ejection fraction, by estimation, is 60 to 65%. The left ventricle has normal function. The left ventricle has no regional wall motion abnormalities. Definity contrast agent was given IV to delineate the left ventricular  endocardial borders. The left ventricular internal cavity size was normal in size. There is mild left ventricular hypertrophy. Left ventricular diastolic parameters are consistent with Grade I diastolic  dysfunction (impaired relaxation). Right Ventricle: The right ventricular size is mildly enlarged. No increase in right ventricular wall thickness. Right ventricular systolic function is moderately reduced. There is normal pulmonary artery systolic pressure. The tricuspid regurgitant velocity is 2.52 m/s, and with an assumed right atrial pressure of 8 mmHg, the estimated right ventricular systolic pressure is 82.9 mmHg. Left Atrium: Left atrial size was normal in size. Right Atrium: Right atrial size was normal in size. Pericardium: There is no evidence of pericardial effusion. Mitral Valve: The mitral valve is normal in structure. No evidence of mitral valve regurgitation. No evidence of mitral valve stenosis. Tricuspid Valve: The tricuspid valve is normal in structure. Tricuspid  valve regurgitation is trivial. Aortic Valve: The aortic valve is tricuspid. Aortic valve regurgitation is not visualized. No aortic stenosis is present. Pulmonic Valve: The pulmonic valve was not well visualized. Pulmonic valve regurgitation is not visualized. Aorta: Aortic dilatation noted. There is mild dilatation of the ascending aorta, measuring 38 mm. Venous: The inferior vena cava is normal in size with less than 50% respiratory variability, suggesting right atrial pressure of 8 mmHg. IAS/Shunts: No atrial level shunt detected by color flow Doppler.  LEFT VENTRICLE PLAX 2D LVIDd:         3.20 cm  Diastology LVIDs:         1.70 cm  LV e' medial:    4.68 cm/s LV PW:         1.20 cm  LV E/e' medial:  10.2 LV IVS:        1.40 cm  LV e' lateral:   4.35 cm/s LVOT diam:     2.00 cm  LV E/e' lateral: 10.9 LV SV:         52 LV SV Index:   26 LVOT Area:     3.14 cm  RIGHT VENTRICLE RV S prime:     12.60 cm/s TAPSE (M-mode): 1.9 cm LEFT ATRIUM             Index       RIGHT ATRIUM           Index LA diam:        3.20 cm 1.61 cm/m  RA Area:     19.10 cm LA Vol (A2C):   47.4 ml 23.92 ml/m RA Volume:   49.10 ml  24.78 ml/m LA Vol (A4C):   43.1 ml 21.75 ml/m LA Biplane Vol: 45.9 ml 23.16 ml/m  AORTIC VALVE LVOT Vmax:   112.00 cm/s LVOT Vmean:  75.200 cm/s LVOT VTI:    0.167 m  AORTA Ao Root diam: 3.20 cm Ao Asc diam:  3.80 cm MITRAL VALVE               TRICUSPID VALVE MV Area (PHT): 3.76 cm    TR Peak grad:   25.4 mmHg MV Decel Time: 202 msec    TR Vmax:        252.00 cm/s MV E velocity: 47.60 cm/s MV A velocity: 92.10 cm/s  SHUNTS MV E/A ratio:  0.52        Systemic VTI:  0.17 m                            Systemic Diam: 2.00 cm Loralie Champagne MD Electronically signed by Loralie Champagne MD Signature Date/Time: 04/29/2020/4:25:58 PM    Final    VAS [place] LOWER EXTREMITY VENOUS (DVT) (ONLY [facility] & [facility])  Result Date: 04/30/2020  Lower Venous DVT Study Indications: Recent COVID infection 03/2020, now with D-dimer >20 and  hypoxia.  Limitations: Poor ultrasound/tissue interface. Comparison Study: 03/26/2020 negative lower extremity venous duplex Performing Technologist: Maudry Mayhew MHA, RDMS, RVT, RDCS  Examination Guidelines: A complete evaluation includes B-mode imaging, spectral Doppler, color Doppler, and power Doppler as needed of all accessible portions of each vessel. Bilateral testing is considered an integral part of a complete examination. Limited examinations for reoccurring indications may be performed as noted. The reflux portion of the exam is performed with the patient in reverse Trendelenburg.  +---------+---------------+---------+-----------+----------+--------------+ RIGHT    CompressibilityPhasicitySpontaneityPropertiesThrombus Aging +---------+---------------+---------+-----------+----------+--------------+ CFV      Full           No       Yes                                 +---------+---------------+---------+-----------+----------+--------------+ SFJ      Full                                                        +---------+---------------+---------+-----------+----------+--------------+ FV Prox  Full                                                        +---------+---------------+---------+-----------+----------+--------------+ FV Mid   Full                                                        +---------+---------------+---------+-----------+----------+--------------+ FV DistalFull                                                        +---------+---------------+---------+-----------+----------+--------------+ PFV      Full                                                        +---------+---------------+---------+-----------+----------+--------------+ POP      Full           No       Yes                                 +---------+---------------+---------+-----------+----------+--------------+ PTV      Full                                                         +---------+---------------+---------+-----------+----------+--------------+ PERO     Full                                                        +---------+---------------+---------+-----------+----------+--------------+   +---------+---------------+---------+-----------+----------+--------------+  LEFT     CompressibilityPhasicitySpontaneityPropertiesThrombus Aging +---------+---------------+---------+-----------+----------+--------------+ CFV      Full           No       Yes                                 +---------+---------------+---------+-----------+----------+--------------+ SFJ      Full                                                        +---------+---------------+---------+-----------+----------+--------------+ FV Prox  None                                         Acute          +---------+---------------+---------+-----------+----------+--------------+ FV Mid   None                                         Acute          +---------+---------------+---------+-----------+----------+--------------+ FV DistalNone                                         Acute          +---------+---------------+---------+-----------+----------+--------------+ PFV      Full                                                        +---------+---------------+---------+-----------+----------+--------------+ POP      None                    No                   Acute          +---------+---------------+---------+-----------+----------+--------------+ PTV      None                                         Acute          +---------+---------------+---------+-----------+----------+--------------+   Left Technical Findings: Not visualized segments include peroneal veins not adequately visualized.   Summary: RIGHT: - There is no evidence of deep vein thrombosis in the lower extremity.  - No cystic structure found in the popliteal fossa.  LEFT: -  Findings consistent with acute deep vein thrombosis involving the left femoral vein, left popliteal vein, and left posterior tibial veins. - No cystic structure found in the popliteal fossa.  Lower extremity venous flow is pulsatile, suggestive of possibly elevated right heart pressure.  *See table(s) above for measurements and observations. Electronically signed by Ruta Hinds MD on 04/30/2020 at 11:45:06 AM.    Final    ECHOCARDIOGRAM LIMITED  Result Date: 04/06/2020    ECHOCARDIOGRAM LIMITED REPORT   Patient Name:  Karlyn Agee Date of Exam: 04/06/2020 Medical Rec #:  [id number]         Height:       63.0 in Accession #:    [id number]        Weight:       225.0 lb Date of Birth:  03/30/1946         BSA:          2.033 m Patient Age:    41 years          BP:           111/75 mmHg Patient Gender: F                 HR:           96 bpm. Exam Location:  Inpatient Procedure: Limited Echo, Cardiac Doppler, Color Doppler and Intracardiac            Opacification Agent Indications:    Dyspnea  History:        Patient has no prior history of Echocardiogram examinations.                 Risk Factors:Dyslipidemia and Former Smoker. COVID-19.  Sonographer:    Clayton Lefort RDCS (AE) Referring Phys: [id number] DAWOOD Graciela Husbands  Sonographer Comments: Technically challenging study due to limited acoustic windows, Technically difficult study due to poor echo windows, suboptimal parasternal window, suboptimal apical window, suboptimal subcostal window and patient is morbidly obese.  COVID-19. IMPRESSIONS  1. Left ventricular ejection fraction, by estimation, is 60 to 65%. The left ventricle has normal function.  2. Right ventricular systolic function was not well visualized. The right ventricular size is moderately enlarged.  3. The mitral valve was not well visualized.  4. The aortic valve was not well visualized.  5. The inferior vena cava is normal in size with <50% respiratory variability, suggesting right atrial  pressure of 8 mmHg. Comparison(s): No prior Echocardiogram. Conclusion(s)/Recommendation(s): Technically challenging study with limited windows. Even with use of echo contrast, able to assess normal LVEF but limited for wall motion analysis. No significant valve disease by doppler, but not well visualized. FINDINGS  Left Ventricle: Left ventricular ejection fraction, by estimation, is 60 to 65%. The left ventricle has normal function. Definity contrast agent was given IV to delineate the left ventricular endocardial borders. Right Ventricle: The right ventricular size is moderately enlarged. Right vetricular wall thickness was not well visualized. Right ventricular systolic function was not well visualized. Left Atrium: Left atrial size was not well visualized. Right Atrium: Right atrial size was not well visualized. Pericardium: The pericardium was not well visualized. Mitral Valve: The mitral valve was not well visualized. Tricuspid Valve: The tricuspid valve is not well visualized. Aortic Valve: The aortic valve was not well visualized. Pulmonic Valve: The pulmonic valve was not well visualized. Aorta: The aortic root was not well visualized, the ascending aorta was not well visualized and the aortic arch was not well visualized. Venous: The inferior vena cava is normal in size with less than 50% respiratory variability, suggesting right atrial pressure of 8 mmHg. LEFT VENTRICLE PLAX 2D LVOT diam:     2.10 cm  Diastology LVOT Area:     3.46 cm LV e' medial:    3.37 cm/s                         LV E/e' medial:  11.1  LV e' lateral:   4.46 cm/s                         LV E/e' lateral: 8.4  IVC IVC diam: 1.70 cm  AORTA Ao Root diam: 3.30 cm Ao Asc diam:  3.30 cm MITRAL VALVE MV Area (PHT): 3.68 cm    SHUNTS MV Decel Time: 206 msec    Systemic Diam: 2.10 cm MV E velocity: 37.30 cm/s MV A velocity: 72.40 cm/s MV E/A ratio:  0.52 Buford Dresser MD Electronically signed by Buford Dresser MD Signature Date/Time: 04/06/2020/2:33:32 PM    Final

## 2020-05-03 ENCOUNTER — Inpatient Hospital Stay (HOSPITAL_COMMUNITY): Payer: Medicare Other

## 2020-05-03 ENCOUNTER — Encounter (HOSPITAL_COMMUNITY): Payer: Self-pay

## 2020-05-03 DIAGNOSIS — J9601 Acute respiratory failure with hypoxia: Secondary | ICD-10-CM | POA: Diagnosis not present

## 2020-05-03 DIAGNOSIS — I2699 Other pulmonary embolism without acute cor pulmonale: Secondary | ICD-10-CM

## 2020-05-03 DIAGNOSIS — Z515 Encounter for palliative care: Secondary | ICD-10-CM | POA: Diagnosis not present

## 2020-05-03 DIAGNOSIS — Z7189 Other specified counseling: Secondary | ICD-10-CM | POA: Diagnosis not present

## 2020-05-03 LAB — CBC WITH DIFFERENTIAL/PLATELET
Abs Immature Granulocytes: 0.15 10*3/uL — ABNORMAL HIGH (ref 0.00–0.07)
Basophils Absolute: 0 10*3/uL (ref 0.0–0.1)
Basophils Relative: 0 %
Eosinophils Absolute: 0 10*3/uL (ref 0.0–0.5)
Eosinophils Relative: 0 %
HCT: 38.7 % (ref 36.0–46.0)
Hemoglobin: 12.8 g/dL (ref 12.0–15.0)
Immature Granulocytes: 1 %
Lymphocytes Relative: 7 %
Lymphs Abs: 1 10*3/uL (ref 0.7–4.0)
MCH: 31.4 pg (ref 26.0–34.0)
MCHC: 33.1 g/dL (ref 30.0–36.0)
MCV: 94.9 fL (ref 80.0–100.0)
Monocytes Absolute: 1.1 10*3/uL — ABNORMAL HIGH (ref 0.1–1.0)
Monocytes Relative: 7 %
Neutro Abs: 12.6 10*3/uL — ABNORMAL HIGH (ref 1.7–7.7)
Neutrophils Relative %: 85 %
Platelets: 369 10*3/uL (ref 150–400)
RBC: 4.08 MIL/uL (ref 3.87–5.11)
RDW: 18.2 % — ABNORMAL HIGH (ref 11.5–15.5)
WBC: 14.8 10*3/uL — ABNORMAL HIGH (ref 4.0–10.5)
nRBC: 0 % (ref 0.0–0.2)

## 2020-05-03 LAB — COMPREHENSIVE METABOLIC PANEL
ALT: 88 U/L — ABNORMAL HIGH (ref 0–44)
AST: 41 U/L (ref 15–41)
Albumin: 2.4 g/dL — ABNORMAL LOW (ref 3.5–5.0)
Alkaline Phosphatase: 98 U/L (ref 38–126)
Anion gap: 10 (ref 5–15)
BUN: 50 mg/dL — ABNORMAL HIGH (ref 8–23)
CO2: 30 mmol/L (ref 22–32)
Calcium: 12.2 mg/dL — ABNORMAL HIGH (ref 8.9–10.3)
Chloride: 103 mmol/L (ref 98–111)
Creatinine, Ser: 1.34 mg/dL — ABNORMAL HIGH (ref 0.44–1.00)
GFR, Estimated: 42 mL/min — ABNORMAL LOW (ref >60)
Glucose, Bld: 124 mg/dL — ABNORMAL HIGH (ref 70–99)
Potassium: 4.5 mmol/L (ref 3.5–5.1)
Sodium: 143 mmol/L (ref 135–145)
Total Bilirubin: 0.6 mg/dL (ref 0.3–1.2)
Total Protein: 6.2 g/dL — ABNORMAL LOW (ref 6.5–8.1)

## 2020-05-03 LAB — CULTURE, BLOOD (ROUTINE X 2)
Culture: NO GROWTH
Culture: NO GROWTH

## 2020-05-03 LAB — MAGNESIUM: Magnesium: 2.1 mg/dL (ref 1.7–2.4)

## 2020-05-03 LAB — D-DIMER, QUANTITATIVE: D-Dimer, Quant: 1.57 ug/mL-FEU — ABNORMAL HIGH (ref 0.00–0.50)

## 2020-05-03 LAB — PROCALCITONIN: Procalcitonin: 0.37 ng/mL

## 2020-05-03 LAB — C-REACTIVE PROTEIN: CRP: 7.6 mg/dL — ABNORMAL HIGH (ref <1.0)

## 2020-05-03 LAB — BRAIN NATRIURETIC PEPTIDE: B Natriuretic Peptide: 405.6 pg/mL — ABNORMAL HIGH (ref 0.0–100.0)

## 2020-05-03 MED ORDER — HYDROMORPHONE HCL 1 MG/ML IJ SOLN
0.5000 mg | Freq: Two times a day (BID) | INTRAMUSCULAR | Status: DC | PRN
  Administered 2020-05-04: 0.5 mg via INTRAVENOUS
  Filled 2020-05-03: qty 1

## 2020-05-03 MED ORDER — ONDANSETRON HCL 4 MG/2ML IJ SOLN: 4.0000 mg | Freq: Four times a day (QID) | INTRAMUSCULAR | Status: DC | PRN

## 2020-05-03 MED ORDER — HALOPERIDOL 0.5 MG PO TABS
0.5000 mg | ORAL_TABLET | ORAL | Status: DC | PRN
  Filled 2020-05-03: qty 1

## 2020-05-03 MED ORDER — METHYLPREDNISOLONE SODIUM SUCC 40 MG IJ SOLR
30.0000 mg | Freq: Every day | INTRAMUSCULAR | Status: DC
  Administered 2020-05-04: 30 mg via INTRAVENOUS
  Filled 2020-05-03: qty 1

## 2020-05-03 MED ORDER — BIOTENE DRY MOUTH MT LIQD: 15.0000 mL | OROMUCOSAL | Status: DC | PRN

## 2020-05-03 MED ORDER — DIPHENHYDRAMINE HCL 50 MG/ML IJ SOLN
25.0000 mg | Freq: Four times a day (QID) | INTRAMUSCULAR | Status: DC | PRN
  Administered 2020-05-03 – 2020-05-07 (×6): 25 mg via INTRAVENOUS
  Filled 2020-05-03 (×6): qty 1

## 2020-05-03 MED ORDER — HALOPERIDOL LACTATE 5 MG/ML IJ SOLN
0.5000 mg | INTRAMUSCULAR | Status: DC | PRN
  Administered 2020-05-08: 0.5 mg via INTRAVENOUS
  Filled 2020-05-03 (×2): qty 1

## 2020-05-03 MED ORDER — LORAZEPAM 2 MG/ML IJ SOLN
0.5000 mg | INTRAMUSCULAR | Status: DC | PRN
  Administered 2020-05-07 – 2020-05-08 (×3): 0.5 mg via INTRAVENOUS
  Filled 2020-05-03 (×5): qty 1

## 2020-05-03 MED ORDER — ONDANSETRON 4 MG PO TBDP: 4.0000 mg | ORAL_TABLET | Freq: Four times a day (QID) | ORAL | Status: DC | PRN

## 2020-05-03 MED ORDER — GLYCOPYRROLATE 0.2 MG/ML IJ SOLN: 0.2000 mg | INTRAMUSCULAR | Status: DC | PRN

## 2020-05-03 MED ORDER — GLYCOPYRROLATE 0.2 MG/ML IJ SOLN
0.2000 mg | INTRAMUSCULAR | Status: DC | PRN
  Administered 2020-05-07: 0.2 mg via INTRAVENOUS
  Filled 2020-05-03: qty 1

## 2020-05-03 MED ORDER — POLYVINYL ALCOHOL 1.4 % OP SOLN
1.0000 [drp] | Freq: Four times a day (QID) | OPHTHALMIC | Status: DC | PRN
  Administered 2020-05-06: 1 [drp] via OPHTHALMIC
  Filled 2020-05-03 (×2): qty 15

## 2020-05-03 MED ORDER — GLYCOPYRROLATE 1 MG PO TABS
1.0000 mg | ORAL_TABLET | ORAL | Status: DC | PRN
  Filled 2020-05-03: qty 1

## 2020-05-03 MED ORDER — HALOPERIDOL LACTATE 2 MG/ML PO CONC
0.5000 mg | ORAL | Status: DC | PRN
  Filled 2020-05-03: qty 0.3

## 2020-05-03 NOTE — Progress Notes (Signed)
ANTICOAGULATION CONSULT NOTE - Follow Up Consult  Pharmacy Consult for Lovenox Indication: DVT  Labs: Recent Labs    05/01/20 0223 05/01/20 0223 05/02/20 0328 05/03/20 0205  HGB 12.4   < > 12.7 12.8  HCT 36.6  --  38.3 38.7  PLT 241  --  357 369  CREATININE 1.06*  --  1.32* 1.34*   < > = values in this interval not displayed.    Assessment: 74yo female admitted with acute hypoxic respiratory failure with new DVT.   Pharmacy is consulted to dose Lovenox.  CrCl ~40.67ml/min  Goal of Therapy:  Anti-Xa 0.6-1 units/mL Monitor platelets   Plan:  Continue Lovenox 100 mg (1mg /kg) SQ every 12 hours Monitor CBC, weight  Amanda Ross A. Levada Dy, PharmD, BCPS, FNKF Clinical Pharmacist [facility] Please utilize Amion for appropriate phone number to reach the unit pharmacist ([place])  .

## 2020-05-03 NOTE — Progress Notes (Signed)
Physical Therapy Treatment Patient Details Name: Amanda Ross MRN: [id number] DOB: [birth year] Today's Date: 05/03/2020    History of Present Illness Amanda Ross  is a 74 y.o. female, with recent treated Covid 19 PNA > 3 weeks ago, Sjogren's syndrome, hypertension, asthma, left thyroid nodule, who was discharged about 3-1/2 weeks ago after treatment of COVID-19 pneumonia presents to the ER with gradually progressive shortness of breath which started about a week after she went home,    PT Comments    Pt supine, slid down in the bed with 2 daughters and husband present. Family awaiting palliative meeting. PT/OT suggested getting up to chair for meeting, pt declined due to increased discomfort with sitting up in bed. Pt agreeable to stand up and step towards HoB for better positioning to place bed in chair position. Pt with c/o of increased sinus pain due to drying oxygen therapy. Pt require minAx2 for bed mobility. Able to sit EoB for ~15 minutes recovering O2 from low 80s. Pt modAx2 to stand and step towards HoB. Pt requires ~5 min to recover before requiring modAx2 for return to bed. Bed placed in chair position. Awaiting outcome of Palliative Meeting for further progression of patient.     Follow Up Recommendations  Home health PT;Supervision/Assistance - 24 hour (palliative on board recommending hospice)     Equipment Recommendations  None recommended by PT (may need hospital bed if going home with hospice)       Precautions / Restrictions Precautions Precautions: Fall Precaution Comments: HHFNC 40 L FiO2 100%, NRB 15 L, O2 >90%O2 drops to mid 80s with removal of NRB  Restrictions Weight Bearing Restrictions: No    Mobility  Bed Mobility Overal bed mobility: Needs Assistance Bed Mobility: Supine to Sit     Supine to sit: Min assist;+2 for physical assistance Sit to supine: Mod assist;+2 for physical assistance   General bed mobility comments: min Ax2 for management of  LE off bed, trunk to upright and pad scoot of hips to EoB  Transfers Overall transfer level: Needs assistance Equipment used: 2 person hand held assist Transfers: Sit to/from [facility] Sit to Stand: Min assist;+2 physical assistance         General transfer comment: minAx2 for powerup into standing, steadying and line mangement  Ambulation/Gait Ambulation/Gait assistance: Min assist;+2 physical assistance Gait Distance (Feet): 3 Feet Assistive device: 2 person hand held assist Gait Pattern/deviations: Step-to pattern;Decreased step length - right;Decreased step length - left;Decreased weight shift to left Gait velocity: decr Gait velocity interpretation: <1.31 ft/sec, indicative of household ambulator General Gait Details: min Ax2 for steadying with lateral steps toward HoB to reposition      Balance Overall balance assessment: Modified Independent Sitting-balance support: Feet supported Sitting balance-Leahy Scale: Good     Standing balance support: Bilateral upper extremity supported;During functional activity Standing balance-Leahy Scale: Poor Standing balance comment: requires UE support to maintain balance                            Cognition Arousal/Alertness: Awake/alert Behavior During Therapy: WFL for tasks assessed/performed Overall Cognitive Status: Within Functional Limits for tasks assessed                                 General Comments: very pleasant and willing to participate         General Comments General comments (skin integrity,  edema, etc.): Husband and 2 daughters present for up coming Palliative meeting, Pt on HHFNC 40 L 100%FiO2, oxygen saturation drops to mid 80s with mobility and takes 1-21min for recovery      Pertinent Vitals/Pain Pain Assessment: Faces Faces Pain Scale: Hurts little more Pain Location: sinuses, sacrum with sitting up in bed and pressure on LE with assist in moving  Pain  Descriptors / Indicators: Aching;Sore;Grimacing;Guarding Pain Intervention(s): Limited activity within patient's tolerance;Monitored during session;Repositioned           PT Goals (current goals can now be found in the care plan section) Acute Rehab PT Goals Patient Stated Goal: return home PT Goal Formulation: With patient/family Time For Goal Achievement: 05/13/20 Potential to Achieve Goals: Fair Progress towards PT goals: Progressing toward goals    Frequency    Min 2X/week      PT Plan Frequency needs to be updated    Co-evaluation PT/OT/SLP Co-Evaluation/Treatment: Yes Reason for Co-Treatment: Complexity of the patient's impairments (multi-system involvement) PT goals addressed during session: Mobility/safety with mobility        AM-PAC PT "6 Clicks" Mobility   Outcome Measure  Help needed turning from your back to your side while in a flat bed without using bedrails?: A Little Help needed moving from lying on your back to sitting on the side of a flat bed without using bedrails?: A Little Help needed moving to and from a bed to a chair (including a wheelchair)?: A Little Help needed standing up from a chair using your arms (e.g., wheelchair or bedside chair)?: A Little Help needed to walk in hospital room?: A Lot Help needed climbing 3-5 steps with a railing? : Total 6 Click Score: 15    End of Session Equipment Utilized During Treatment: Oxygen (NRB and 15LO2 high flow via [place]) Activity Tolerance:  (by DOE and Spo2 dropping into 80s) Patient left: with call bell/phone within reach;in bed;with family/visitor present Nurse Communication: Mobility status PT Visit Diagnosis: Muscle weakness (generalized) (M62.81);Difficulty in walking, not elsewhere classified (R26.2)     Time: 1219-7588 PT Time Calculation (min) (ACUTE ONLY): 40 min  Charges:  $Therapeutic Activity: 8-22 mins                     Naileah Karg B. Migdalia Dk PT, DPT Acute Rehabilitation  Services Pager [phone] Office [phone]    [place] 05/03/2020, 11:42 AM

## 2020-05-03 NOTE — Progress Notes (Signed)
PROGRESS NOTE                                                                                                                                                                                                             Patient Demographics:    Amanda Ross, is a 74 y.o. female, DOB - 20-Apr-1946, DHR:[id number]  Outpatient Primary MD for the patient is [name], MD    LOS - 5  Admit date - 04/21/2020    Chief Complaint  Patient presents with   Shortness of Breath       Brief Narrative (HPI from H&P)  -  Amanda Ross  is a 74 y.o. female, with recent treated Covid 19 PNA > 3 weeks ago, Sjogren's syndrome, hypertension, asthma, left thyroid nodule, who was discharged about 3-1/2 weeks ago after treatment of COVID-19 pneumonia presents to the ER with gradually progressive shortness of breath which started about a week after she went home, she has also noticed some swelling in her left lower extremity, also noticed a change that she has slight difficulty in laying flat at night and now using 2 pillows.  In the ER she was diagnosed with acute hypoxic respiratory failure, extremely elevated D-dimer, acute left lower extremity DVT, due to allergy to IV dye CT scan could not be done.  She also had an elevated BNP, she was diagnosed with left lower extremity DVT and admitted for further work-up for acute hypoxic respiratory failure.   Subjective:   Patient in bed unfortunately looks very tired, still short of breath on maxed out heated high flow oxygen and nonrebreather mask, no chest or abdominal pain.   Assessment  & Plan :     1. Acute Hypoxic Resp. Failure in a patient with recently treated for Severe COVID-19 pneumonia now according to CT scan seems to have incurred severe parenchymal lung injury causing her hypoxia -   She unfortunately seems to have incurred severe parenchymal lung injury from the recent COVID-19  pneumonia causing her severe hypoxia, she was diuresed with IV Lasix for any hint of fluid overload, she is already on oral Levaquin for total of 6 days, full dose anticoagulation for DVT, IV steroids added 04/30/2020.   Unfortunately she continues to be severely hypoxic, case was discussed with pulmonary physician Dr. Valeta Harms who reviewed the CT scan and  agreed that patient likely has incurred life-threatening and severe parenchymal lung injury.  So far little to no clinical improvement, likely this injury is not reversible.  Continue to monitor going on heated high flow.  Her prognosis is extremely guarded.    I have discussed her grave prognosis with patient's daughter Sharyn Lull and husband multiple times, patient is now DNR, we will continue supportive care till family arrives most likely later today and thereafter most likely transition to comfort measures.  SpO2: 95 % O2 Flow Rate (L/min): 40 L/min FiO2 (%): 100 %  Recent Labs  Lab 04/24/2020 1536 04/25/2020 1536 04/29/2020 1712 05/17/2020 1920 04/29/20 0205 04/30/20 0313 05/01/20 0223 05/02/20 0328 05/02/20 0331 05/02/20 1100 05/03/20 0205  WBC 9.1   < >  --   --  8.0 9.1 13.5* 16.4*  --   --  14.8*  HGB 12.3   < >  --   --  11.2* 11.8* 12.4 12.7  --   --  12.8  HCT 37.9   < >  --   --  35.1* 35.6* 36.6 38.3  --   --  38.7  PLT 155   < >  --   --  157 207 241 357  --   --  369  CRP  --    < >  --  10.9* 12.0* 23.6* 29.4* 16.7*  --   --  7.6*  BNP 592.1*   < >  --   --  545.8* 421.2* 621.4*  --  418.6*  --  405.6*  DDIMER >20.00*   < >  --   --  16.04* 4.16* 2.90* 1.92*  --   --  1.57*  PROCALCITON  --   --   --  0.13 <0.10 0.34  --   --   --  0.45 0.37  AST 19   < >  --   --  16 15 22  58*  --   --  41  ALT 24   < >  --   --  19 18 25  75*  --   --  88*  ALKPHOS 86   < >  --   --  73 91 99 107  --   --  98  BILITOT 1.0   < >  --   --  1.3* 1.0 1.1 1.0  --   --  0.6  ALBUMIN 3.0*   < >  --   --  2.6* 2.4* 2.4* 2.4*  --   --  2.4*  INR  1.2  --   --   --   --   --   --   --   --   --   --   LATICACIDVEN  --   --  1.3 1.8  --   --   --   --   --   --   --    < > = values in this interval not displayed.   Results for ASHLEYMARIE, GRANDERSON (MRN [id number]) as of 04/29/2020 13:43  Ref. Range 05/10/2020 15:36 04/27/2020 19:20 04/29/2020 08:26  Troponin I (High Sensitivity) Latest Ref Range: <18 ng/L 87 (H) 103 (HH) 105 (HH)      2.  Productive cough could be possible CAP versus bronchitis.  Acute infection cannot be ruled out, negative nasal MRSA PCR, sputum Gram stain and culture prelim report noted, continue 6 days of Levaquin.  3.  GERD.  Continue PPI.  4. Elevated Troponin.  Likely due to demand ischemia from #1 above, trend is not an ACS pattern, echocardiogram shows a preserved EF without any wall motion abnormalities.  On Lopressor and full dose anticoagulation for DVT,  nonacute EKG and no chest pain.  5.  Morbid obesity.  BMI 37.  Follow with PCP for weight loss.  6.  Sjogren's syndrome.  Continue Plaquenil and artificial tears.  7.  OSA.  Oxygen at night.  8.  Dyslipidemia.  Continue home dose statin.  9.  Essential hypertension.  added low dose lopressor.  10.  Acute on chronic diastolic CHF at admission, EF 60%.  Has been adequately diuresed, on beta-blocker.  11. Echocardiogram also shows developing pulmonary hypertension likely again due to severe parenchymal lung injury.  12.  AKI.  Hold further diuresis.    Condition -   Guarded +++  Family Communication  :   Husband [name] 04/29/2020 on [phone].  Updated again in detail on 04/30/2020 including poor prognosis, 05/02/20  Daughter Hermelinda Dellen (Daughter) [phone] updated On 04/30/20, 05/01/20 message left at 9:11 AM. 05/02/20    Code Status :  DNR  Consults  :  PCCM - phone, palliative care  Procedures  :    CT chest - 1. Interval development of severe bilateral pulmonary parenchymal disease compared to the relatively  recent prior imaging dated 03/25/2020. There is new significant bronchiectasis throughout the posterior aspect of the right upper lobe, the right lower lobe and within the periphery of the left upper lobe. The associated pulmonary parenchyma is nearly completely replaced with interstitial and confluent airspace opacities. Findings are most consistent with severe post COVID scarring and architectural distortion. 2. New emphysematous changes in the right upper lobe near the apex. 3. No evidence of pleural effusion or pneumothorax. 4. Increasing prominence of mediastinal lymph nodes are almost certainly reactive in nature. 5. Enlarged main pulmonary artery suggests developing pulmonary arterial hypertension. 6. Coronary artery and aortic atherosclerotic calcifications. Aortic Atherosclerosis  Lower extremity venous duplex.  Positive for Acute DVT in the left leg.    VQ - Low probability  TTE -  1. Left ventricular ejection fraction, by estimation, is 60 to 65%. The left ventricle has normal function. The left ventricle has no regional wall motion abnormalities. There is mild left ventricular hypertrophy. Left ventricular diastolic parameters are consistent with Grade I diastolic dysfunction (impaired relaxation).  2. Right ventricular systolic function is moderately reduced. The right ventricular size is mildly enlarged. With RV free wall hypokinesis and preserved apical function, would consider PE. There is normal pulmonary artery systolic pressure. The estimated right ventricular systolic pressure is 29.5 mmHg.  3. The mitral valve is normal in structure. No evidence of mitral valve regurgitation. No evidence of mitral stenosis.  4. The aortic valve is tricuspid. Aortic valve regurgitation is not visualized. No aortic stenosis is present.  5. Aortic dilatation noted. There is mild dilatation of the ascending aorta, measuring 38 mm.  6. The inferior vena cava is normal in size with <50% respiratory variability,  suggesting right atrial pressure of 8 mmHg.  7. 5 cm x 5 cm cystic structure in liver.    PUD Prophylaxis : Pepcid  Disposition Plan  :    Status is: Inpatient  Remains inpatient appropriate because:IV treatments appropriate due to intensity of illness or inability to take PO  Dispo: The patient is from: Home              Anticipated d/c is to: Home  Anticipated d/c date is: > 3 days              Patient currently is not medically stable to d/c.  DVT Prophylaxis  :   Heparin  Gtt >> Lovenox  Lab Results  Component Value Date   PLT 369 05/03/2020    Diet :  Diet Order            DIET SOFT Room service appropriate? Yes with Assist; Fluid consistency: Thin  Diet effective now                  Inpatient Medications  Scheduled Meds:  (feeding supplement) PROSource Plus  30 mL Oral BID BM   atorvastatin  10 mg Oral Daily   cholecalciferol  2,000 Units Oral Daily   enoxaparin (LOVENOX) injection  100 mg Subcutaneous Q12H   hydroxychloroquine  200 mg Oral q morning - 10a   levofloxacin  500 mg Oral Daily   magnesium gluconate  500 mg Oral Daily   methylPREDNISolone (SOLU-MEDROL) injection  60 mg Intravenous Daily   metoprolol tartrate  25 mg Oral BID   oxybutynin  10 mg Oral QHS   [START ON 05-15-2020] pantoprazole  80 mg Oral Daily   traZODone  50 mg Oral QHS   Continuous Infusions:  PRN Meds:.acetaminophen **OR** [DISCONTINUED] acetaminophen, albuterol, baclofen, bisacodyl, guaiFENesin-dextromethorphan, morphine, [DISCONTINUED] ondansetron **OR** ondansetron (ZOFRAN) IV, polyvinyl alcohol, sodium chloride  Antibiotics  :    Anti-infectives (From admission, onward)   Start     Dose/Rate Route Frequency Ordered Stop   05/02/20 1000  levofloxacin (LEVAQUIN) tablet 500 mg        500 mg Oral Daily 05/02/20 0729 05/04/20 0959   04/29/20 1000  hydroxychloroquine (PLAQUENIL) tablet 200 mg        200 mg Oral  Every morning - 10a 04/26/2020 1723      04/29/20 0600  vancomycin (VANCOREADY) IVPB 750 mg/150 mL  Status:  Discontinued        750 mg 150 mL/hr over 60 Minutes Intravenous Every 12 hours 05/02/2020 1728 04/29/20 1022   05/13/2020 1745  levofloxacin (LEVAQUIN) tablet 750 mg        750 mg Oral Daily 05/10/2020 1738 05/02/20 0959   05/10/2020 1715  ceFEPIme (MAXIPIME) 2 g in sodium chloride 0.9 % 100 mL IVPB        2 g 200 mL/hr over 30 Minutes Intravenous  Once 05/12/2020 1712 05/07/2020 1825   05/10/2020 1715  vancomycin (VANCOREADY) IVPB 2000 mg/400 mL        2,000 mg 200 mL/hr over 120 Minutes Intravenous  Once 04/30/2020 1714 05/13/2020 2054       Time Spent in minutes  30   Lala Lund M.D on 05/03/2020 at 9:02 AM  To page go to [email or web address] - password [facility]  [facility] -  Office  [phone]    See all Orders from today for further details    Objective:   Vitals:   05/03/20 0317 05/03/20 0330 05/03/20 0835 05/03/20 0838  BP: 91/64  117/67   Pulse: 82 85 81 78  Resp: 20 (!) 24 (!) 24 (!) 28  Temp: 98 F (36.7 C)  98.8 F (37.1 C)   TempSrc: Oral  Axillary   SpO2: 90% 92% 93% 95%  Weight:      Height:        Wt Readings from Last 3 Encounters:  04/20/2020 96.1 kg  04/26/20 96.1 kg  04/11/20 96.1 kg  Intake/Output Summary (Last 24 hours) at 05/03/2020 0902 Last data filed at 05/03/2020 0400 Gross per 24 hour  Intake --  Output 1400 ml  Net -1400 ml     Physical Exam  Awake Alert, No new F.N deficits, Normal affect [place].AT,PERRAL Supple Neck,No JVD, No cervical lymphadenopathy appriciated.  Symmetrical Chest wall movement, coarse B sounds with few rales RRR,No Gallops, Rubs or new Murmurs, No Parasternal Heave +ve B.Sounds, Abd Soft, No tenderness, No organomegaly appriciated, No rebound - guarding or rigidity. No Cyanosis,  left leg swollen as compared to the right    Data Review:    CBC Recent Labs  Lab 04/29/20 0205 04/30/20 0313 05/01/20 0223 05/02/20 0328 05/03/20 0205  WBC  8.0 9.1 13.5* 16.4* 14.8*  HGB 11.2* 11.8* 12.4 12.7 12.8  HCT 35.1* 35.6* 36.6 38.3 38.7  PLT 157 207 241 357 369  MCV 96.4 95.2 92.4 95.0 94.9  MCH 30.8 31.6 31.3 31.5 31.4  MCHC 31.9 33.1 33.9 33.2 33.1  RDW 19.2* 18.5* 17.8* 18.5* 18.2*  LYMPHSABS 1.5 1.0 0.6* 0.7 1.0  MONOABS 1.0 1.1* 0.7 1.0 1.1*  EOSABS 0.3 0.2 0.0 0.0 0.0  BASOSABS 0.0 0.0 0.0 0.0 0.0    Recent Labs  Lab 05/05/2020 1536 04/19/2020 1536 05/13/2020 1712 05/15/2020 1920 04/29/20 0205 04/30/20 0313 05/01/20 0223 05/02/20 0328 05/02/20 0331 05/02/20 1100 05/03/20 0205  NA 141   < >  --   --  143 140 140 140  --   --  143  K 4.3   < >  --   --  4.1 4.3 4.3 4.5  --   --  4.5  CL 111   < >  --   --  108 104 103 100  --   --  103  CO2 21*   < >  --   --  26 27 26 29   --   --  30  GLUCOSE 116*   < >  --   --  84 102* 123* 157*  --   --  124*  BUN 16   < >  --   --  14 19 21  39*  --   --  50*  CREATININE 1.03*   < >  --   --  0.98 1.14* 1.06* 1.32*  --   --  1.34*  CALCIUM 10.8*   < >  --   --  10.7* 10.6* 11.3* 12.0*  --   --  12.2*  AST 19   < >  --   --  16 15 22  58*  --   --  41  ALT 24   < >  --   --  19 18 25  75*  --   --  88*  ALKPHOS 86   < >  --   --  73 91 99 107  --   --  98  BILITOT 1.0   < >  --   --  1.3* 1.0 1.1 1.0  --   --  0.6  ALBUMIN 3.0*   < >  --   --  2.6* 2.4* 2.4* 2.4*  --   --  2.4*  MG  --   --   --   --  1.9 1.6* 2.2 2.4  --   --  2.1  CRP  --    < >  --  10.9* 12.0* 23.6* 29.4* 16.7*  --   --  7.6*  DDIMER >20.00*   < >  --   --  16.04* 4.16* 2.90* 1.92*  --   --  1.57*  PROCALCITON  --   --   --  0.13 <0.10 0.34  --   --   --  0.45 0.37  LATICACIDVEN  --   --  1.3 1.8  --   --   --   --   --   --   --   INR 1.2  --   --   --   --   --   --   --   --   --   --   BNP 592.1*   < >  --   --  545.8* 421.2* 621.4*  --  418.6*  --  405.6*   < > = values in this interval not displayed.     ------------------------------------------------------------------------------------------------------------------ No results for input(s): CHOL, HDL, LDLCALC, TRIG, CHOLHDL, LDLDIRECT in the last 72 hours.  Lab Results  Component Value Date   HGBA1C 5.5 02/19/2018   ------------------------------------------------------------------------------------------------------------------ No results for input(s): TSH, T4TOTAL, T3FREE, THYROIDAB in the last 72 hours.  Invalid input(s): FREET3  Cardiac Enzymes No results for input(s): CKMB, TROPONINI, MYOGLOBIN in the last 168 hours.  Invalid input(s): CK ------------------------------------------------------------------------------------------------------------------    Component Value Date/Time   BNP 405.6 (H) 05/03/2020 0205    Micro Results Recent Results (from the past 240 hour(s))  Culture, blood (routine x 2)     Status: None (Preliminary result)   Collection Time: 04/18/2020  5:12 PM   Specimen: BLOOD  Result Value Ref Range Status   Specimen Description BLOOD BLOOD LEFT WRIST  Final   Special Requests   Final    BOTTLES DRAWN AEROBIC AND ANAEROBIC Blood Culture results may not be optimal due to an inadequate volume of blood received in culture bottles   Culture   Final    NO GROWTH 4 DAYS Performed at [facility], [place] [address] [place], [place] [address]    Report Status PENDING  Incomplete  Culture, blood (routine x 2)     Status: None (Preliminary result)   Collection Time: 05/17/2020  7:20 PM   Specimen: BLOOD  Result Value Ref Range Status   Specimen Description BLOOD RIGHT ANTECUBITAL  Final   Special Requests   Final    BOTTLES DRAWN AEROBIC ONLY Blood Culture results may not be optimal due to an inadequate volume of blood received in culture bottles   Culture   Final    NO GROWTH 4 DAYS Performed at [facility], [place] [address], [place], [place] [address]    Report Status PENDING  Incomplete  MRSA  PCR Screening     Status: None   Collection Time: 04/30/20 11:20 AM   Specimen: Nasal Mucosa; Nasopharyngeal  Result Value Ref Range Status   MRSA by PCR NEGATIVE NEGATIVE Final    Comment:        The GeneXpert MRSA Assay (FDA approved for NASAL specimens only), is one component of a comprehensive MRSA colonization surveillance program. It is not intended to diagnose MRSA infection nor to guide or monitor treatment for MRSA infections. Performed at [facility], [place] [address] [place], [place] [address]   Culture, Urine     Status: None   Collection Time: 04/30/20 11:26 AM   Specimen: Urine, Clean Catch  Result Value Ref Range Status   Specimen Description URINE, CLEAN CATCH  Final   Special Requests NONE  Final   Culture   Final  NO GROWTH Performed at [facility], [place] [address], [place], [place] [address]    Report Status 05/01/2020 FINAL  Final  Expectorated sputum assessment w rflx to resp cult     Status: None   Collection Time: 05/01/20  8:39 PM   Specimen: Sputum  Result Value Ref Range Status   Specimen Description SPUTUM  Final   Special Requests NONE  Final   Sputum evaluation   Final    THIS SPECIMEN IS ACCEPTABLE FOR SPUTUM CULTURE Performed at [facility], [place] [address] [place], [place] [address]    Report Status 05/02/2020 FINAL  Final  Culture, respiratory     Status: None (Preliminary result)   Collection Time: 05/01/20  8:39 PM   Specimen: SPU  Result Value Ref Range Status   Specimen Description SPUTUM  Final   Special Requests NONE Reflexed from [id number]  Final   Gram Stain   Final    FEW WBC PRESENT, PREDOMINANTLY PMN MODERATE YEAST MODERATE GRAM POSITIVE COCCI IN PAIRS IN CLUSTERS FEW GRAM NEGATIVE RODS Performed at [facility], [place] [address], [place], [place] [address]    Culture PENDING  Incomplete   Report Status PENDING  Incomplete    Radiology Reports  CT CHEST WO CONTRAST  Result Date:  04/30/2020 CLINICAL DATA:  Chest pain, shortness of breath. Recently diagnosed with COVID in October EXAM: CT CHEST WITHOUT CONTRAST TECHNIQUE: Multidetector CT imaging of the chest was performed following the standard protocol without IV contrast. COMPARISON:  Prior CT scan of the chest 03/25/2020 FINDINGS: Cardiovascular: Limited evaluation in the absence of intravenous contrast. Tortuous and atherosclerotic thoracic aorta. The main pulmonary artery is enlarged at 3.6 cm. Mild cardiomegaly. Trace pericardial effusion. Atherosclerotic calcifications noted along the course of the left anterior descending coronary artery. Mediastinum/Nodes: Incidental note is made of a small nodule posterior the upper pole of the left thyroid gland. This measures less than 1.5 cm and therefore requires no further evaluation. Interval development of left AP window lymph nodes measuring up to 1.1 cm in short axis. Almost certainly reactive in nature given the relatively short time frame since the prior examination. Unremarkable esophagus. Lungs/Pleura: Severe interval progression of pulmonary parenchymal disease. Interval development of extensive bronchiectasis most severe in the right upper and lower lobes but also present in the periphery of the left upper lobe. Additionally, there is new emphysematous change in the right lung apex and extensive interstitial and confluent airspace opacities present throughout the portions of the lung affected with bronchiectatic changes. No pleural effusion. No pneumothorax. Upper Abdomen: No acute abnormality within the upper abdomen. Left renal and hepatic cysts again noted. Musculoskeletal: No acute fracture or aggressive appearing lytic or blastic osseous lesion. IMPRESSION: 1. Interval development of severe bilateral pulmonary parenchymal disease compared to the relatively recent prior imaging dated 03/25/2020. There is new significant bronchiectasis throughout the posterior aspect of the right  upper lobe, the right lower lobe and within the periphery of the left upper lobe. The associated pulmonary parenchyma is nearly completely replaced with interstitial and confluent airspace opacities. Findings are most consistent with severe post COVID scarring and architectural distortion. 2. New emphysematous changes in the right upper lobe near the apex. 3. No evidence of pleural effusion or pneumothorax. 4. Increasing prominence of mediastinal lymph nodes are almost certainly reactive in nature. 5. Enlarged main pulmonary artery suggests developing pulmonary arterial hypertension. 6. Coronary artery and aortic atherosclerotic calcifications. Aortic Atherosclerosis (ICD10-I70.0) and Emphysema (ICD10-J43.9).  Electronically Signed   By: Jacqulynn Cadet M.D.   On: 04/30/2020 11:13   CARDIAC CATHETERIZATION  Result Date: 04/15/2020 Medical treatment for possible microvascular disease or demand ischemia. Low dose aspirin as tolerated.  NM Pulmonary Perf and Vent  Result Date: 04/29/2020 CLINICAL DATA:  74 year old female discharged from the hospital 3.5 weeks ago after being treated for COVID-19. Pneumonia gradual progressive shortness of breath, lower extremity swelling. EXAM: NUCLEAR MEDICINE PERFUSION LUNG SCAN TECHNIQUE: Perfusion images were obtained in multiple projections after intravenous injection of radiopharmaceutical. Ventilation scans intentionally deferred if perfusion scan and chest x-ray adequate for interpretation during COVID 19 epidemic. RADIOPHARMACEUTICALS:  4.4 mCi Tc-108m MAA IV COMPARISON:  Portable chest radiograph yesterday. FINDINGS: Generalized mildly to moderately decreased perfusion radiotracer in the right lung, corresponding to asymmetric coarse interstitial opacity on that side yesterday. Comparatively normal perfusion radiotracer activity on the left. No wedge-shaped or suspicious perfusion defect identified. IMPRESSION: 1. No strong evidence of acute pulmonary embolus. 2.  Generalized decreased perfusion in the right lung which demonstrated widespread abnormal opacity on portable chest x-ray yesterday. Electronically Signed   By: Genevie Ann M.D.   On: 04/29/2020 15:33   DG Chest Port 1 View  Result Date: 05/03/2020 CLINICAL DATA:  Shortness of breath.  History of CHF. EXAM: PORTABLE CHEST 1 VIEW COMPARISON:  CT chest April 30, 2020. Chest radiograph April 28, 2020. FINDINGS: Bilateral peripheral predominant interstitial and airspace opacities. No visible pleural effusions or pneumothorax. Similar enlarged cardiomediastinal silhouette. Aortic atherosclerosis. No acute osseous abnormality. IMPRESSION: Bilateral peripheral prominent interstitial and airspace opacities. Findings most likely represent multifocal pneumonia and/or post infectious scarring, although an element of pulmonary edema is difficult to exclude. Please see recent CT chest for further characterization. Electronically Signed   By: Margaretha Sheffield MD   On: 05/03/2020 07:54   DG Chest Port 1 View  Result Date: 05/15/2020 CLINICAL DATA:  Shortness of breath EXAM: PORTABLE CHEST 1 VIEW COMPARISON:  04/03/2020, 03/31/2020 CT 03/25/2020 FINDINGS: Significant interval worsening right greater than left pulmonary airspace opacities concerning for bilateral pneumonia. Enlarged cardiomediastinal silhouette with aortic atherosclerosis. No significant effusion. No pneumothorax. IMPRESSION: Significant interval worsening of right greater than left pulmonary airspace opacities concerning for bilateral pneumonia. Electronically Signed   By: Donavan Foil M.D.   On: 04/22/2020 17:03   ECHOCARDIOGRAM COMPLETE  Result Date: 04/29/2020    ECHOCARDIOGRAM REPORT   Patient Name:   Amanda Ross Date of Exam: 04/29/2020 Medical Rec #:  [id number]         Height:       63.0 in Accession #:    [id number]        Weight:       211.9 lb Date of Birth:  12-26-1945         BSA:          1.982 m Patient Age:    59 years           BP:           119/72 mmHg Patient Gender: F                 HR:           103 bpm. Exam Location:  Inpatient Procedure: 2D Echo, Color Doppler, Cardiac Doppler and Intracardiac            Opacification Agent Indications:    I77.82 Acute diastolic (congestive) heart failure  History:        Patient has  prior history of Echocardiogram examinations, most                 recent 04/06/2020. Risk Factors:Hypertension, Dyslipidemia,                 Sleep Apnea and COVID+ 03/24/20.  Sonographer:    Raquel Sarna Senior RDCS Referring Phys: Graylin Shiver [facility]  Sonographer Comments: Technically difficult due to patient body habitus. IMPRESSIONS  1. Left ventricular ejection fraction, by estimation, is 60 to 65%. The left ventricle has normal function. The left ventricle has no regional wall motion abnormalities. There is mild left ventricular hypertrophy. Left ventricular diastolic parameters are consistent with Grade I diastolic dysfunction (impaired relaxation).  2. Right ventricular systolic function is moderately reduced. The right ventricular size is mildly enlarged. With RV free wall hypokinesis and preserved apical function, would consider PE. There is normal pulmonary artery systolic pressure. The estimated right ventricular systolic pressure is 37.6 mmHg.  3. The mitral valve is normal in structure. No evidence of mitral valve regurgitation. No evidence of mitral stenosis.  4. The aortic valve is tricuspid. Aortic valve regurgitation is not visualized. No aortic stenosis is present.  5. Aortic dilatation noted. There is mild dilatation of the ascending aorta, measuring 38 mm.  6. The inferior vena cava is normal in size with <50% respiratory variability, suggesting right atrial pressure of 8 mmHg.  7. 5 cm x 5 cm cystic structure in liver. FINDINGS  Left Ventricle: Left ventricular ejection fraction, by estimation, is 60 to 65%. The left ventricle has normal function. The left ventricle has no regional wall motion  abnormalities. Definity contrast agent was given IV to delineate the left ventricular  endocardial borders. The left ventricular internal cavity size was normal in size. There is mild left ventricular hypertrophy. Left ventricular diastolic parameters are consistent with Grade I diastolic dysfunction (impaired relaxation). Right Ventricle: The right ventricular size is mildly enlarged. No increase in right ventricular wall thickness. Right ventricular systolic function is moderately reduced. There is normal pulmonary artery systolic pressure. The tricuspid regurgitant velocity is 2.52 m/s, and with an assumed right atrial pressure of 8 mmHg, the estimated right ventricular systolic pressure is 28.3 mmHg. Left Atrium: Left atrial size was normal in size. Right Atrium: Right atrial size was normal in size. Pericardium: There is no evidence of pericardial effusion. Mitral Valve: The mitral valve is normal in structure. No evidence of mitral valve regurgitation. No evidence of mitral valve stenosis. Tricuspid Valve: The tricuspid valve is normal in structure. Tricuspid valve regurgitation is trivial. Aortic Valve: The aortic valve is tricuspid. Aortic valve regurgitation is not visualized. No aortic stenosis is present. Pulmonic Valve: The pulmonic valve was not well visualized. Pulmonic valve regurgitation is not visualized. Aorta: Aortic dilatation noted. There is mild dilatation of the ascending aorta, measuring 38 mm. Venous: The inferior vena cava is normal in size with less than 50% respiratory variability, suggesting right atrial pressure of 8 mmHg. IAS/Shunts: No atrial level shunt detected by color flow Doppler.  LEFT VENTRICLE PLAX 2D LVIDd:         3.20 cm  Diastology LVIDs:         1.70 cm  LV e' medial:    4.68 cm/s LV PW:         1.20 cm  LV E/e' medial:  10.2 LV IVS:        1.40 cm  LV e' lateral:   4.35 cm/s LVOT diam:  2.00 cm  LV E/e' lateral: 10.9 LV SV:         52 LV SV Index:   26 LVOT Area:      3.14 cm  RIGHT VENTRICLE RV S prime:     12.60 cm/s TAPSE (M-mode): 1.9 cm LEFT ATRIUM             Index       RIGHT ATRIUM           Index LA diam:        3.20 cm 1.61 cm/m  RA Area:     19.10 cm LA Vol (A2C):   47.4 ml 23.92 ml/m RA Volume:   49.10 ml  24.78 ml/m LA Vol (A4C):   43.1 ml 21.75 ml/m LA Biplane Vol: 45.9 ml 23.16 ml/m  AORTIC VALVE LVOT Vmax:   112.00 cm/s LVOT Vmean:  75.200 cm/s LVOT VTI:    0.167 m  AORTA Ao Root diam: 3.20 cm Ao Asc diam:  3.80 cm MITRAL VALVE               TRICUSPID VALVE MV Area (PHT): 3.76 cm    TR Peak grad:   25.4 mmHg MV Decel Time: 202 msec    TR Vmax:        252.00 cm/s MV E velocity: 47.60 cm/s MV A velocity: 92.10 cm/s  SHUNTS MV E/A ratio:  0.52        Systemic VTI:  0.17 m                            Systemic Diam: 2.00 cm Loralie Champagne MD Electronically signed by Loralie Champagne MD Signature Date/Time: 04/29/2020/4:25:58 PM    Final    VAS [place] LOWER EXTREMITY VENOUS (DVT) (ONLY [facility] & [facility])  Result Date: 04/30/2020  Lower Venous DVT Study Indications: Recent COVID infection 03/2020, now with D-dimer >20 and hypoxia.  Limitations: Poor ultrasound/tissue interface. Comparison Study: 03/26/2020 negative lower extremity venous duplex Performing Technologist: Maudry Mayhew MHA, RDMS, RVT, RDCS  Examination Guidelines: A complete evaluation includes B-mode imaging, spectral Doppler, color Doppler, and power Doppler as needed of all accessible portions of each vessel. Bilateral testing is considered an integral part of a complete examination. Limited examinations for reoccurring indications may be performed as noted. The reflux portion of the exam is performed with the patient in reverse Trendelenburg.  +---------+---------------+---------+-----------+----------+--------------+  RIGHT     Compressibility Phasicity Spontaneity Properties Thrombus Aging  +---------+---------------+---------+-----------+----------+--------------+  CFV       Full            No         Yes                                    +---------+---------------+---------+-----------+----------+--------------+  SFJ       Full                                                             +---------+---------------+---------+-----------+----------+--------------+  FV Prox   Full                                                             +---------+---------------+---------+-----------+----------+--------------+  FV Mid    Full                                                             +---------+---------------+---------+-----------+----------+--------------+  FV Distal Full                                                             +---------+---------------+---------+-----------+----------+--------------+  PFV       Full                                                             +---------+---------------+---------+-----------+----------+--------------+  POP       Full            No        Yes                                    +---------+---------------+---------+-----------+----------+--------------+  PTV       Full                                                             +---------+---------------+---------+-----------+----------+--------------+  PERO      Full                                                             +---------+---------------+---------+-----------+----------+--------------+   +---------+---------------+---------+-----------+----------+--------------+  LEFT      Compressibility Phasicity Spontaneity Properties Thrombus Aging  +---------+---------------+---------+-----------+----------+--------------+  CFV       Full            No        Yes                                    +---------+---------------+---------+-----------+----------+--------------+  SFJ       Full                                                             +---------+---------------+---------+-----------+----------+--------------+  FV Prox   None  Acute            +---------+---------------+---------+-----------+----------+--------------+  FV Mid    None                                             Acute           +---------+---------------+---------+-----------+----------+--------------+  FV Distal None                                             Acute           +---------+---------------+---------+-----------+----------+--------------+  PFV       Full                                                             +---------+---------------+---------+-----------+----------+--------------+  POP       None                      No                     Acute           +---------+---------------+---------+-----------+----------+--------------+  PTV       None                                             Acute           +---------+---------------+---------+-----------+----------+--------------+   Left Technical Findings: Not visualized segments include peroneal veins not adequately visualized.   Summary: RIGHT: - There is no evidence of deep vein thrombosis in the lower extremity.  - No cystic structure found in the popliteal fossa.  LEFT: - Findings consistent with acute deep vein thrombosis involving the left femoral vein, left popliteal vein, and left posterior tibial veins. - No cystic structure found in the popliteal fossa.  Lower extremity venous flow is pulsatile, suggestive of possibly elevated right heart pressure.  *See table(s) above for measurements and observations. Electronically signed by Ruta Hinds MD on 04/30/2020 at 11:45:06 AM.    Final    ECHOCARDIOGRAM LIMITED  Result Date: 04/06/2020    ECHOCARDIOGRAM LIMITED REPORT   Patient Name:   Amanda Ross Date of Exam: 04/06/2020 Medical Rec #:  [id number]         Height:       63.0 in Accession #:    [id number]        Weight:       225.0 lb Date of Birth:  [birth year]         BSA:          2.033 m Patient Age:    59 years          BP:           111/75 mmHg Patient Gender: F                 HR:  96 bpm.  Exam Location:  Inpatient Procedure: Limited Echo, Cardiac Doppler, Color Doppler and Intracardiac            Opacification Agent Indications:    Dyspnea  History:        Patient has no prior history of Echocardiogram examinations.                 Risk Factors:Dyslipidemia and Former Smoker. COVID-19.  Sonographer:    Clayton Lefort RDCS (AE) Referring Phys: [id number] DAWOOD Graciela Husbands  Sonographer Comments: Technically challenging study due to limited acoustic windows, Technically difficult study due to poor echo windows, suboptimal parasternal window, suboptimal apical window, suboptimal subcostal window and patient is morbidly obese.  COVID-19. IMPRESSIONS  1. Left ventricular ejection fraction, by estimation, is 60 to 65%. The left ventricle has normal function.  2. Right ventricular systolic function was not well visualized. The right ventricular size is moderately enlarged.  3. The mitral valve was not well visualized.  4. The aortic valve was not well visualized.  5. The inferior vena cava is normal in size with <50% respiratory variability, suggesting right atrial pressure of 8 mmHg. Comparison(s): No prior Echocardiogram. Conclusion(s)/Recommendation(s): Technically challenging study with limited windows. Even with use of echo contrast, able to assess normal LVEF but limited for wall motion analysis. No significant valve disease by doppler, but not well visualized. FINDINGS  Left Ventricle: Left ventricular ejection fraction, by estimation, is 60 to 65%. The left ventricle has normal function. Definity contrast agent was given IV to delineate the left ventricular endocardial borders. Right Ventricle: The right ventricular size is moderately enlarged. Right vetricular wall thickness was not well visualized. Right ventricular systolic function was not well visualized. Left Atrium: Left atrial size was not well visualized. Right Atrium: Right atrial size was not well visualized. Pericardium: The pericardium was not  well visualized. Mitral Valve: The mitral valve was not well visualized. Tricuspid Valve: The tricuspid valve is not well visualized. Aortic Valve: The aortic valve was not well visualized. Pulmonic Valve: The pulmonic valve was not well visualized. Aorta: The aortic root was not well visualized, the ascending aorta was not well visualized and the aortic arch was not well visualized. Venous: The inferior vena cava is normal in size with less than 50% respiratory variability, suggesting right atrial pressure of 8 mmHg. LEFT VENTRICLE PLAX 2D LVOT diam:     2.10 cm  Diastology LVOT Area:     3.46 cm LV e' medial:    3.37 cm/s                         LV E/e' medial:  11.1                         LV e' lateral:   4.46 cm/s                         LV E/e' lateral: 8.4  IVC IVC diam: 1.70 cm  AORTA Ao Root diam: 3.30 cm Ao Asc diam:  3.30 cm MITRAL VALVE MV Area (PHT): 3.68 cm    SHUNTS MV Decel Time: 206 msec    Systemic Diam: 2.10 cm MV E velocity: 37.30 cm/s MV A velocity: 72.40 cm/s MV E/A ratio:  0.52 Buford Dresser MD Electronically signed by Buford Dresser MD Signature Date/Time: 04/06/2020/2:33:32 PM    Final

## 2020-05-03 NOTE — Progress Notes (Signed)
Occupational Therapy Treatment Patient Details Name: Amanda Ross MRN: [id number] DOB: Jun 30, 1945 Today's Date: 05/03/2020    History of present illness 74 y.o. female, with recent treated Covid 19 PNA > 3 weeks ago, Sjogren's syndrome, hypertension, asthma, left thyroid nodule, who was discharged about 3-1/2 weeks ago after treatment of COVID-19 pneumonia presents to the ER with gradually progressive shortness of breath which started about a week after she went home,   OT comments  Upon arrival, pt supine in bed with husband and two daughters at bedside. Pt lower in the bed and reporting slight pain at back and buttocks. Pt requiring Max A for bed mobility and performing drinking/grooming while sitting at EOB. While doffing NRB mask to drink and have daughter brush hair, pt SpO2 dropping to 83% on 40L HHFNC; requiring NRB at 15L and increased time for purse lip breathing to recover to 93-92%. Pt requiring Min A +2 to take steps towards HOB. Family planning to meet with palliative to decide plan. Will continue to follow as needed acutely.  SpO2 93-92% on 40L HHFNC and 15L NRB. HR 90s   Follow Up Recommendations  Home health OT;Supervision - Intermittent    Equipment Recommendations  Tub/shower bench    Recommendations for Other Services      Precautions / Restrictions Precautions Precautions: Fall Precaution Comments: HHFNC 40 L FiO2 100%, NRB 15 L, O2 >90%O2 drops to mid 80s with removal of NRB        Mobility Bed Mobility Overal bed mobility: Needs Assistance Bed Mobility: Supine to Sit     Supine to sit: Min assist;+2 for physical assistance Sit to supine: Mod assist;+2 for physical assistance   General bed mobility comments: min Ax2 for management of LE off bed, trunk to upright and pad scoot of hips to EoB  Transfers Overall transfer level: Needs assistance Equipment used: 2 person hand held assist Transfers: Sit to/from [facility] Sit to Stand:  Min assist;+2 physical assistance         General transfer comment: minAx2 for powerup into standing, steadying and line mangement    Balance Overall balance assessment: Modified Independent Sitting-balance support: Feet supported Sitting balance-Leahy Scale: Good     Standing balance support: Bilateral upper extremity supported;During functional activity Standing balance-Leahy Scale: Poor Standing balance comment: requires UE support to maintain balance                           ADL either performed or assessed with clinical judgement   ADL Overall ADL's : Needs assistance/impaired Eating/Feeding: Set up;Sitting Eating/Feeding Details (indicate cue type and reason): SpO2 dropping to 83% with NRB off to drink her water.  Grooming: Maximal assistance;Sitting;Brushing hair Grooming Details (indicate cue type and reason): Daughter brushing pt's hair                             Functional mobility during ADLs: Minimal assistance;+2 for physical assistance;+2 for safety/equipment General ADL Comments: Pt participating in bed mobility, sitting at EOB, applying lotion, and standing to step towards [facility]     Vision       Perception     Praxis      Cognition Arousal/Alertness: Awake/alert Behavior During Therapy: WFL for tasks assessed/performed Overall Cognitive Status: Within Functional Limits for tasks assessed  General Comments: very pleasant and willing to participate        Exercises     Shoulder Instructions       General Comments Husband and two daughter present throughout session. SpO2 dropping to 83% during mobility and with NRB off.     Pertinent Vitals/ Pain       Pain Assessment: Faces Faces Pain Scale: Hurts little more Pain Location: sinuses, sacrum with sitting up in bed and pressure on LE with assist in moving  Pain Descriptors / Indicators: Aching;Sore;Grimacing;Guarding Pain  Intervention(s): Monitored during session;Limited activity within patient's tolerance;Repositioned  Home Living                                          Prior Functioning/Environment              Frequency  Min 2X/week        Progress Toward Goals  OT Goals(current goals can now be found in the care plan section)  Progress towards OT goals: Not progressing toward goals - comment  Acute Rehab OT Goals Patient Stated Goal: return home OT Goal Formulation: With patient Time For Goal Achievement: 04/11/20 Potential to Achieve Goals: Fair ADL Goals Pt Will Perform Grooming: Independently;sitting Pt Will Perform Lower Body Bathing: Independently;sit to/from stand Pt Will Perform Lower Body Dressing: Independently;sit to/from stand Pt Will Transfer to Toilet: with modified independence;regular height toilet;ambulating  Plan Discharge plan remains appropriate    Co-evaluation    PT/OT/SLP Co-Evaluation/Treatment: Yes Reason for Co-Treatment: Complexity of the patient's impairments (multi-system involvement);For patient/therapist safety;To address functional/ADL transfers   OT goals addressed during session: ADL's and self-care      AM-PAC OT "6 Clicks" Daily Activity     Outcome Measure   Help from another person eating meals?: None Help from another person taking care of personal grooming?: A Little Help from another person toileting, which includes using toliet, bedpan, or urinal?: A Little Help from another person bathing (including washing, rinsing, drying)?: A Little Help from another person to put on and taking off regular upper body clothing?: A Little Help from another person to put on and taking off regular lower body clothing?: A Little 6 Click Score: 19    End of Session Equipment Utilized During Treatment: Oxygen ([place] 40L and NRB 15L)  OT Visit Diagnosis: Other (comment);Unsteadiness on feet (R26.81);Other abnormalities of gait and  mobility (R26.89);Muscle weakness (generalized) (M62.81)   Activity Tolerance Patient limited by fatigue   Patient Left in bed;with call bell/phone within reach;with family/visitor present   Nurse Communication Mobility status        Time: 1023-1105 OT Time Calculation (min): 42 min  Charges: OT General Charges $OT Visit: 1 Visit OT Treatments $Self Care/Home Management : 23-37 mins  [place], OTR/L Acute Rehab Pager: [phone] Office: [place] 05/03/2020, 3:22 PM

## 2020-05-03 NOTE — Plan of Care (Signed)
Patient received to room [phone] alert and oriented. Daughter at the bedside. Family and patient oriented to room. Patient remains on Non rebreather with oxygen saturation B/88-92. Palliative care in progress. Work notes provided physician given to family.  Problem: Education: Goal: Knowledge of General Education information will improve Description: Including pain rating scale, medication(s)/side effects and non-pharmacologic comfort measures Outcome: Not Progressing   Problem: Health Behavior/Discharge Planning: Goal: Ability to manage health-related needs will improve Outcome: Not Progressing   Problem: Clinical Measurements: Goal: Ability to maintain clinical measurements within normal limits will improve Outcome: Not Progressing Goal: Will remain free from infection Outcome: Not Progressing Goal: Diagnostic test results will improve Outcome: Not Progressing Goal: Respiratory complications will improve Outcome: Not Progressing Goal: Cardiovascular complication will be avoided Outcome: Not Progressing   Problem: Activity: Goal: Risk for activity intolerance will decrease Outcome: Not Progressing   Problem: Nutrition: Goal: Adequate nutrition will be maintained Outcome: Not Progressing   Problem: Coping: Goal: Level of anxiety will decrease Outcome: Not Progressing   Problem: Elimination: Goal: Will not experience complications related to bowel motility Outcome: Not Progressing Goal: Will not experience complications related to urinary retention Outcome: Not Progressing   Problem: Pain Managment: Goal: General experience of comfort will improve Outcome: Not Progressing   Problem: Safety: Goal: Ability to remain free from injury will improve Outcome: Not Progressing   Problem: Skin Integrity: Goal: Risk for impaired skin integrity will decrease Outcome: Not Progressing

## 2020-05-03 NOTE — Plan of Care (Signed)
                                      [place]                            [place], [place] [address]      Amanda Ross was admitted to the Hospital on 05/01/2020 and is still gravely sick, she is expected to pass away in a few days.  Kindly excuse her following family members from work due to this family emergency.  [name] (02/18/1970) [name] (01/24/1968) [name] (03/24/1930)   Lala Lund MD, [facility]  Phone - [phone] Fax [phone].  Lala Lund M.D on 05/03/2020,at 2:19 PM  [facility]   Office  [phone]

## 2020-05-03 NOTE — Care Management Important Message (Signed)
Important Message  Patient Details  Name: Amanda Ross MRN: [id number] Date of Birth: 11/05/1945   Medicare Important Message Given:  Yes     Shelda Altes 05/03/2020, 3:50 PM

## 2020-05-03 NOTE — Progress Notes (Signed)
Daily Progress Note   Patient Name: Amanda Ross       Date: 05/03/2020 DOB: 1946/03/18  Age: 74 y.o. MRN#: [id number] Attending Physician: Amanda Lose, MD Primary Care Physician: Amanda Chard, MD Admit Date: 04/22/2020  Reason for Consultation/Follow-up:    To discuss complex medical decision making related to patient's goals of care.  Patient remains on 40L of HFNC at 100% FiO2 with a non-rebreather mask in place.  Subjective: Patient, husband Amanda Ross, and two daughters Amanda Ross and Amanda Ross are together with me.  Patient is comfortable lying in bed.  We had a very open conversation about the vicious way COVID has attacked Amanda Ross's lungs.  In a very short period of time they have become terribly scarred and can no longer support her body.  The only cure would be a lung transplant and unfortunately she is not a candidate for a transplant.    I reviewed imaging with Amanda Ross.  We discuss a shift to comfort orientated care during which we would continue HFNC and allow her family and friends to visit her.  Amanda Ross understandably broke down.  The patient became tearful.  Amanda Ross was in severe pain and had to leave the room briefly.  Amanda Ross is a Clinical biochemist and supported her mother thru the conversation.  I briefly discussed how a compassionate wean would work with the two daughters.  We also briefly considered LTAC.  The daughters agreed LTAC was not in their mother's best interest as it will not change the outcome and Amanda Ross needs her family around at the time.  This was an extremely difficult conversation to have with a very lovely patient and family.   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     Patient  Profile/HPI:  Per intake H&P -->Amanda Ross is a 74 y.o. female, with recent treated Covid 19 PNA >3 weeks ago, Sjogren's syndrome, hypertension, asthma, left thyroid nodule,diastolic heart failure. Shedischarged about 3-1/2 weeks ago after treatment of COVID-19 pneumonia now hasprogressive shortness of breath which started about a week after she went home. Identified to havesignificant parenchymal lung injuryd/t COVID-19 has a very poor prognosis.   Length of Stay: 5   Vital Signs: BP 117/67 (BP Location: Right Wrist)   Pulse 78   Temp  98.8 F (37.1 C) (Axillary)   Resp (!) 28   Ht 5\' 3"  (1.6 m)   Wt 96.1 kg   SpO2 95%   BMI 37.53 kg/m  SpO2: SpO2: 95 % O2 Device: O2 Device: High Flow Nasal Cannula, NRB (HHFNC) O2 Flow Rate: O2 Flow Rate (L/min): 40 L/min       Palliative Assessment/Data: 30%     Palliative Care Plan    Recommendations/Plan:  Shirt to comfort measures only while allowing family to visit.   Continue HFNC until after family travels in from [place] and has time to say goodbye.  Will move to 6N.  Code Status:  DNR  Prognosis:   < 2 weeks.   Minutes to hours once oxygen is weaned.  Discharge Planning:  Anticipated Hospital Death  Care plan was discussed with Dr. Candiss Norse, RN, Family.  Thank you for allowing the Palliative Medicine Team to assist in the care of this patient.  Total time spent:  35 min.     Greater than 50%  of this time was spent counseling and coordinating care related to the above assessment and plan.  Amanda Jenny, PA-C Palliative Medicine  Please contact Palliative MedicineTeam phone at [phone] for questions and concerns between 7 am - 7 pm.   Please see AMION for individual provider pager numbers.

## 2020-05-04 ENCOUNTER — Encounter (HOSPITAL_COMMUNITY): Payer: Self-pay

## 2020-05-04 DIAGNOSIS — J9601 Acute respiratory failure with hypoxia: Secondary | ICD-10-CM | POA: Diagnosis not present

## 2020-05-04 LAB — CULTURE, RESPIRATORY W GRAM STAIN

## 2020-05-04 NOTE — Progress Notes (Signed)
PROGRESS NOTE   Amanda Ross  ZRA:[id number]    DOB: 01-23-1946    DOA: 05/09/2020  PCP: [name], MD   I have briefly reviewed patients previous medical records in [facility].  Chief Complaint  Patient presents with   Shortness of Breath    Brief Narrative:  74 year old female, recently treated for COVID-19 pneumonia >3 weeks prior to this admission, Sjogren's syndrome, hypertension, asthma, left thyroid nodule, presented to the ER with gradually progressive dyspnea that started about a week after she was discharged home, also noticed left lower extremity swelling, orthopnea.  Admitted for acute hypoxic respiratory failure, extremely elevated D-dimer, acute left lower extremity DVT, unable to do CTA chest due to IV contrast dye allergy.  All aggressive measures, continue to do poorly, determined to have poor prognosis, transitioned to full comfort care on 05/03/2020 and palliative care medicine was consulted.   Assessment & Plan:  Active Problems:   Acute hypoxemic respiratory failure (HCC)   Palliative care by specialist   Goals of care, counseling/discussion   DNR (do not resuscitate)   Pressure injury of skin   Comfort measures only status   Pulmonary embolus ([place])   Acute respiratory failure with hypoxia, suspected due to COVID-19 related lung injury: Recently treated for severe COVID-19 pneumonia.  Per CT chest, severe parenchymal lung injury.  Despite aggressive treatment including IV Lasix, course of antibiotics, full dose anticoagulation for DVT and IV steroids, she continued to be severely hypoxic.  Her case was discussed with PCCM who reviewed the CT scanner and agreed that patiently likely had incurred life-threatening and severe parenchymal lung injury which was most likely not reversible.  Prior hospitalist MD discussed her grave prognosis with patient's family, she was transitioned to DNR and eventually to full comfort care on 05/03/2020.  Palliative care  medicine was consulted.  Remains on IV Solu-Medrol.  Will defer to PMT regarding discontinuing medications nonessential for comfort.  As per PMT input 11/16, awaiting extended family members to arrive from out of state.  CAP versus acute bronchitis: Completed course of levofloxacin.  GERD: Continue PPI.  Elevated troponin: Likely due to demand ischemia from acute respiratory failure.  Low likelihood for ACS.  Echo showed preserved EF without wall motion abnormalities.  Remains on metoprolol twice daily.  Will defer to PMT regarding discontinuing all medications nonessential for comfort.  Body mass index is 37.53 kg/m./Morbid obesity  Sjogren's syndrome: On Plaquenil PTA.  Currently on hold.  OSA  Dyslipidemia  Essential hypertension Controlled on metoprolol  Acute on chronic diastolic CHF Echo with LVEF 60%.  S/p IV Lasix.  Clinically euvolemic.  Developing pulmonary hypertension Noted on echo.  AKI:    DVT prophylaxis:   None.  Now on full comfort care.   Code Status: DNR Family Communication: Discussed in detail with patient's 2 daughters at bedside, updated care and answered questions. Disposition:  Status is: Inpatient  Remains inpatient appropriate because:Inpatient level of care appropriate due to severity of illness   Dispo: The patient is from: Home              Anticipated d/c is to: ?  Anticipated hospital death              Anticipated d/c date is: 3 days              Patient currently is not medically stable to d/c.        Consultants:   PCCM consulted by phone Palliative care  medicine  Procedures:   None  Antimicrobials:    Anti-infectives (From admission, onward)   Start     Dose/Rate Route Frequency Ordered Stop   05/02/20 1000  levofloxacin (LEVAQUIN) tablet 500 mg        500 mg Oral Daily 05/02/20 0729 05/03/20 0933   04/29/20 1000  hydroxychloroquine (PLAQUENIL) tablet 200 mg  Status:  Discontinued        200 mg Oral  Every morning  - 10a 04/19/2020 1723 05/03/20 1239   04/29/20 0600  vancomycin (VANCOREADY) IVPB 750 mg/150 mL  Status:  Discontinued        750 mg 150 mL/hr over 60 Minutes Intravenous Every 12 hours 05/13/2020 1728 04/29/20 1022   05/05/2020 1745  levofloxacin (LEVAQUIN) tablet 750 mg        750 mg Oral Daily 05/07/2020 1738 05/02/20 0959   05/13/2020 1715  ceFEPIme (MAXIPIME) 2 g in sodium chloride 0.9 % 100 mL IVPB        2 g 200 mL/hr over 30 Minutes Intravenous  Once 04/25/2020 1712 05/05/2020 1825   05/09/2020 1715  vancomycin (VANCOREADY) IVPB 2000 mg/400 mL        2,000 mg 200 mL/hr over 120 Minutes Intravenous  Once 04/24/2020 1714 05/15/2020 2054        Subjective:  Patient reports some dyspnea.  As per daughter's at bedside, dyspnea appeared to be slightly worse at night when her head end of bed was more reclined.  Better this morning.  No pain reported.  They state that they have family arriving from out of state on Friday.  Objective:   Vitals:   05/03/20 1507 05/03/20 1722 05/03/20 2148 05/04/20 0408  BP: 99/67  119/81 122/76  Pulse: 80 91 78 71  Resp: 16 (!) 30 18 18   Temp: 98.4 F (36.9 C)  97.9 F (36.6 C)   TempSrc: Axillary  Oral   SpO2: 95% 90% 96% 96%  Weight:      Height:        General exam: Elderly female, moderately built and nourished, lying comfortably propped up in bed in no overt distress Respiratory system: Slightly harsh breath sounds bilaterally without overt wheezing, rhonchi or crackles.  Mild increased work of breathing while speaking.  Has NRBM on. Cardiovascular system: S1 & S2 heard, RRR. No JVD, murmurs, rubs, gallops or clicks. No pedal edema. Gastrointestinal system: Abdomen is nondistended, soft and nontender. No organomegaly or masses felt. Normal bowel sounds heard. Central nervous system: Alert and oriented x2. No focal neurological deficits. Extremities: Symmetric 5 x 5 power. Skin: No rashes, lesions or ulcers Psychiatry: Judgement and insight appear normal.  Mood & affect appropriate.     Data Reviewed:   I have personally reviewed following labs and imaging studies   CBC: Recent Labs  Lab 05/01/20 0223 05/02/20 0328 05/03/20 0205  WBC 13.5* 16.4* 14.8*  NEUTROABS 12.2* 14.4* 12.6*  HGB 12.4 12.7 12.8  HCT 36.6 38.3 38.7  MCV 92.4 95.0 94.9  PLT 241 357 413    Basic Metabolic Panel: Recent Labs  Lab 05/01/20 0223 05/02/20 0328 05/03/20 0205  NA 140 140 143  K 4.3 4.5 4.5  CL 103 100 103  CO2 26 29 30   GLUCOSE 123* 157* 124*  BUN 21 39* 50*  CREATININE 1.06* 1.32* 1.34*  CALCIUM 11.3* 12.0* 12.2*  MG 2.2 2.4 2.1    Liver Function Tests: Recent Labs  Lab 05/01/20 0223 05/02/20 0328 05/03/20 0205  AST 22  58* 41  ALT 25 75* 88*  ALKPHOS 99 107 98  BILITOT 1.1 1.0 0.6  PROT 6.1* 6.3* 6.2*  ALBUMIN 2.4* 2.4* 2.4*    CBG: No results for input(s): GLUCAP in the last 168 hours.  Microbiology Studies:   Recent Results (from the past 240 hour(s))  Culture, blood (routine x 2)     Status: None   Collection Time: 05/09/2020  5:12 PM   Specimen: BLOOD  Result Value Ref Range Status   Specimen Description BLOOD BLOOD LEFT WRIST  Final   Special Requests   Final    BOTTLES DRAWN AEROBIC AND ANAEROBIC Blood Culture results may not be optimal due to an inadequate volume of blood received in culture bottles   Culture   Final    NO GROWTH 5 DAYS Performed at [facility], [place] [address] [place], [place] [address]    Report Status 05/03/2020 FINAL  Final  Culture, blood (routine x 2)     Status: None   Collection Time: 05/14/2020  7:20 PM   Specimen: BLOOD  Result Value Ref Range Status   Specimen Description BLOOD RIGHT ANTECUBITAL  Final   Special Requests   Final    BOTTLES DRAWN AEROBIC ONLY Blood Culture results may not be optimal due to an inadequate volume of blood received in culture bottles   Culture   Final    NO GROWTH 5 DAYS Performed at [facility], [place] [address], [place], [place]  [address]    Report Status 05/03/2020 FINAL  Final  MRSA PCR Screening     Status: None   Collection Time: 04/30/20 11:20 AM   Specimen: Nasal Mucosa; Nasopharyngeal  Result Value Ref Range Status   MRSA by PCR NEGATIVE NEGATIVE Final    Comment:        The GeneXpert MRSA Assay (FDA approved for NASAL specimens only), is one component of a comprehensive MRSA colonization surveillance program. It is not intended to diagnose MRSA infection nor to guide or monitor treatment for MRSA infections. Performed at [facility], [place] [address] [place], [place] [address]   Culture, Urine     Status: None   Collection Time: 04/30/20 11:26 AM   Specimen: Urine, Clean Catch  Result Value Ref Range Status   Specimen Description URINE, CLEAN CATCH  Final   Special Requests NONE  Final   Culture   Final    NO GROWTH Performed at [facility], [place] [address], [place], [place] [address]    Report Status 05/01/2020 FINAL  Final  Expectorated sputum assessment w rflx to resp cult     Status: None   Collection Time: 05/01/20  8:39 PM   Specimen: Sputum  Result Value Ref Range Status   Specimen Description SPUTUM  Final   Special Requests NONE  Final   Sputum evaluation   Final    THIS SPECIMEN IS ACCEPTABLE FOR SPUTUM CULTURE Performed at [facility], [place] [address], [place], [place] [address]    Report Status 05/02/2020 FINAL  Final  Culture, respiratory     Status: None   Collection Time: 05/01/20  8:39 PM   Specimen: SPU  Result Value Ref Range Status   Specimen Description SPUTUM  Final   Special Requests NONE Reflexed from [id number]  Final   Gram Stain   Final    FEW WBC PRESENT, PREDOMINANTLY PMN MODERATE YEAST MODERATE GRAM POSITIVE COCCI IN PAIRS IN CLUSTERS FEW  GRAM NEGATIVE RODS Performed at [facility], [place] [address], [place], [place] [address]    Culture ABUNDANT CANDIDA ALBICANS  Final   Report Status 05/04/2020 FINAL  Final     Radiology  Studies:  DG Chest Port 1 View  Result Date: 05/03/2020 CLINICAL DATA:  Shortness of breath.  History of CHF. EXAM: PORTABLE CHEST 1 VIEW COMPARISON:  CT chest April 30, 2020. Chest radiograph April 28, 2020. FINDINGS: Bilateral peripheral predominant interstitial and airspace opacities. No visible pleural effusions or pneumothorax. Similar enlarged cardiomediastinal silhouette. Aortic atherosclerosis. No acute osseous abnormality. IMPRESSION: Bilateral peripheral prominent interstitial and airspace opacities. Findings most likely represent multifocal pneumonia and/or post infectious scarring, although an element of pulmonary edema is difficult to exclude. Please see recent CT chest for further characterization. Electronically Signed   By: Margaretha Sheffield MD   On: 05/03/2020 07:54     Scheduled Meds:    (feeding supplement) PROSource Plus  30 mL Oral BID BM   methylPREDNISolone (SOLU-MEDROL) injection  30 mg Intravenous Daily   metoprolol tartrate  25 mg Oral BID   [START ON 06-06-20] pantoprazole  80 mg Oral Daily   traZODone  50 mg Oral QHS    Continuous Infusions:     LOS: 6 days     Vernell Leep, MD, [place], [facility]. [facility]    To contact the attending provider between 7A-7P or the covering provider during after hours 7P-7A, please log into the web site [email or web address] and access using universal [place] password for that web site. If you do not have the password, please call the hospital operator.  05/04/2020, 5:24 PM

## 2020-05-04 NOTE — Progress Notes (Signed)
Palliative Medicine RN Note: Symptom check.  Daughter at bedside. Pt is resting but appears slightly agitated. Family reports this is the most comfortable she's been & that she has gotten hydromorphone. They are requesting I not wake her, and are reporting she is complaining of itching. Her RN is in another room, but I have requested she bring Benadryl when she is available. Plan for Florentina Jenny to follow up again tomorrow.  Amanda Ross Amanda Daye, RN, BSN, [facility] Palliative Medicine Team 05/04/2020 1:24 PM Office [phone]

## 2020-05-04 NOTE — Progress Notes (Signed)
[facility])  Noted anticipated hospital death.  Please reach out if we can assist in any way.  Venia Carbon RN, BSN, [facility] Liaison

## 2020-05-05 ENCOUNTER — Telehealth: Payer: Medicare Other

## 2020-05-05 DIAGNOSIS — Z8616 Personal history of COVID-19: Secondary | ICD-10-CM

## 2020-05-05 DIAGNOSIS — Z515 Encounter for palliative care: Secondary | ICD-10-CM | POA: Diagnosis not present

## 2020-05-05 DIAGNOSIS — J9601 Acute respiratory failure with hypoxia: Secondary | ICD-10-CM | POA: Diagnosis not present

## 2020-05-05 MED ORDER — CAMPHOR-MENTHOL 0.5-0.5 % EX LOTN
TOPICAL_LOTION | CUTANEOUS | Status: DC | PRN
  Filled 2020-05-05: qty 222

## 2020-05-05 MED ORDER — METHYLPREDNISOLONE SODIUM SUCC 40 MG IJ SOLR
15.0000 mg | Freq: Every day | INTRAMUSCULAR | Status: DC
  Administered 2020-05-05: 15.2 mg via INTRAVENOUS
  Filled 2020-05-05: qty 1

## 2020-05-05 MED ORDER — HYDROMORPHONE HCL 1 MG/ML IJ SOLN
0.2500 mg | Freq: Every day | INTRAMUSCULAR | Status: DC
  Administered 2020-05-05: 0.25 mg via INTRAVENOUS
  Filled 2020-05-05: qty 1

## 2020-05-05 MED ORDER — HYDROMORPHONE HCL 1 MG/ML IJ SOLN
0.5000 mg | INTRAMUSCULAR | Status: DC | PRN
  Administered 2020-05-06 – 2020-05-08 (×8): 0.5 mg via INTRAVENOUS
  Filled 2020-05-05 (×8): qty 1

## 2020-05-05 NOTE — Progress Notes (Signed)
PROGRESS NOTE   VEE BAHE  NVB:[id number]    DOB: Aug 28, 1945    DOA: 05/04/2020  PCP: [name], MD   I have briefly reviewed patients previous medical records in [facility].  Chief Complaint  Patient presents with  . Shortness of Breath    Brief Narrative:  74 year old female, recently treated for COVID-19 pneumonia >3 weeks prior to this admission, Sjogren's syndrome, hypertension, asthma, left thyroid nodule, presented to the ER with gradually progressive dyspnea that started about a week after she was discharged home, also noticed left lower extremity swelling, orthopnea.  Admitted for acute hypoxic respiratory failure, extremely elevated D-dimer, acute left lower extremity DVT, unable to do CTA chest due to IV contrast dye allergy.  Despite all aggressive measures, continued to do poorly, determined to have poor prognosis, transitioned to full comfort care on 05/03/2020 and palliative care medicine was consulted.   Assessment & Plan:  Active Problems:   Acute hypoxemic respiratory failure (HCC)   Palliative care by specialist   Goals of care, counseling/discussion   DNR (do not resuscitate)   Pressure injury of skin   Comfort measures only status   Pulmonary embolus ([place])   Acute respiratory failure with hypoxia, suspected due to COVID-19 related lung injury: Recently treated for severe COVID-19 pneumonia.  Per CT chest, severe parenchymal lung injury.  Despite aggressive treatment including IV Lasix, course of antibiotics, full dose anticoagulation for DVT and IV steroids, she continued to be severely hypoxic.  Her case was discussed with PCCM who reviewed the CT chest and agreed that patiently likely had incurred life-threatening and severe parenchymal lung injury which was most likely not reversible.  Prior hospitalist MD discussed her grave prognosis with patient's family, she was transitioned to DNR and eventually to full comfort care on 05/03/2020.   Palliative care medicine was consulted.  Remains on IV Solu-Medrol.  Will defer to PMT regarding discontinuing medications nonessential for comfort.  As per PMT input 11/16, awaiting extended family members to arrive from [place] of state.  On 11/18, discussed with [name], Palliative Medicine PA-C and with Dr. Lane Hacker, Palliative Medicine Director.  Very difficult and painful situation in that, the patient is alert and oriented but remains dependent on high volume oxygen.  Dr. Hilma Favors and I agreed that it is reasonable to get PCCM to review case, options going forward regarding any further treatable options, measures to reduce oxygen needs so she can consider DC home or residential hospice which would not currently accept her with high oxygen needs versus ultimate option of terminal oxygen wean which will need to be done under some form of sedation.  I consulted PCCM and discussed in detail with Dr. Lynetta Mare regarding above.  He will meet with patient and family.  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  He felt that patient/family may consider terminal wean with morphine infusion.  Will await formal input.  CAP versus acute bronchitis: Completed course of levofloxacin.  GERD: Continue PPI.  Elevated troponin: Likely due to demand ischemia from acute respiratory failure.  Low likelihood for ACS.  Echo showed preserved EF without wall motion abnormalities.  Remains on metoprolol twice daily.  Will defer to PMT regarding discontinuing all medications nonessential for comfort.  Body mass index is 37.53 kg/m./Morbid obesity  Sjogren's syndrome: On Plaquenil PTA.  Currently on hold.  OSA  Dyslipidemia  Essential hypertension Controlled on metoprolol  Acute on chronic diastolic CHF Echo with LVEF 60%.  S/p IV Lasix.  Clinically euvolemic.  Developing  pulmonary hypertension Noted on echo.  AKI:    DVT prophylaxis:   None.  Now on full comfort care.   Code Status: DNR Family Communication: Discussed in detail with patient's daughter at bedside, updated care and answered questions. Disposition:  Status is: Inpatient  Remains inpatient appropriate because:Inpatient level of care appropriate due to severity of illness   Dispo: The patient is from: Home              Anticipated d/c is to: ?  Anticipated hospital death              Anticipated d/c date is: 3 days              Patient currently is not medically stable to d/c.        Consultants:   PCCM Palliative care medicine  Procedures:   None  Antimicrobials:    Anti-infectives (From admission, onward)   Start     Dose/Rate Route Frequency Ordered Stop   05/02/20 1000  levofloxacin (LEVAQUIN) tablet 500 mg        500 mg Oral Daily 05/02/20 0729 05/03/20 0933   04/29/20 1000  hydroxychloroquine (PLAQUENIL) tablet 200 mg  Status:  Discontinued        200 mg Oral  Every morning - 10a 05/05/2020 1723 05/03/20 1239   04/29/20 0600  vancomycin (VANCOREADY) IVPB 750 mg/150 mL  Status:  Discontinued        750 mg 150 mL/hr over 60 Minutes Intravenous Every 12 hours 05/15/2020 1728 04/29/20 1022   05/07/2020 1745  levofloxacin (LEVAQUIN) tablet 750 mg        750 mg Oral Daily 04/19/2020 1738 05/02/20 0959   05/13/2020 1715  ceFEPIme (MAXIPIME) 2 g in sodium chloride 0.9 % 100 mL IVPB        2 g 200 mL/hr over 30 Minutes Intravenous  Once 04/21/2020 1712 04/30/2020 1825   05/17/2020 1715  vancomycin (VANCOREADY) IVPB 2000 mg/400 mL        2,000 mg 200 mL/hr over 120 Minutes Intravenous  Once 05/09/2020 1714 04/27/2020 2054        Subjective:  Interviewed and examined patient in the presence of her daughter at bedside.  Reports that her breathing is better compared to yesterday.  Has intermittent episodes of cough with white sputum, settles with cough medicines.  Denies any other  complaints.  Objective:   Vitals:   05/04/20 2109 05/04/20 2336 05/05/20 1136 05/05/20 1414  BP: 125/77 115/70  122/75  Pulse: 78 79 90 86  Resp:  17 18   Temp:  98.7 F (37.1 C) 98.7 F (37.1 C) 98.5 F (36.9 C)  TempSrc:  Oral Axillary Axillary  SpO2:  93% (!) 87% (!) 85%  Weight:      Height:        General exam: Elderly female, moderately built and nourished, lying comfortably propped up in bed in no overt distress Respiratory system: Somewhat harsh breath sounds bilaterally without overt wheezing or rhonchi.  Some crackles.  No increased work of breathing.  Is on [place] oxygen at 13 L/min and NRB at 15 L/min. Cardiovascular system: S1 & S2 heard, RRR. No JVD, murmurs, rubs, gallops or clicks. No pedal edema. Gastrointestinal system: Abdomen is nondistended, soft and nontender. No organomegaly or masses felt. Normal bowel sounds heard. Central nervous  system: Alert and oriented x2. No focal neurological deficits. Extremities: Symmetric 5 x 5 power. Skin: No rashes, lesions or ulcers Psychiatry: Judgement and insight appear normal. Mood & affect appropriate.     Data Reviewed:   I have personally reviewed following labs and imaging studies   CBC: Recent Labs  Lab 05/01/20 0223 05/02/20 0328 05/03/20 0205  WBC 13.5* 16.4* 14.8*  NEUTROABS 12.2* 14.4* 12.6*  HGB 12.4 12.7 12.8  HCT 36.6 38.3 38.7  MCV 92.4 95.0 94.9  PLT 241 357 161    Basic Metabolic Panel: Recent Labs  Lab 05/01/20 0223 05/02/20 0328 05/03/20 0205  NA 140 140 143  K 4.3 4.5 4.5  CL 103 100 103  CO2 26 29 30   GLUCOSE 123* 157* 124*  BUN 21 39* 50*  CREATININE 1.06* 1.32* 1.34*  CALCIUM 11.3* 12.0* 12.2*  MG 2.2 2.4 2.1    Liver Function Tests: Recent Labs  Lab 05/01/20 0223 05/02/20 0328 05/03/20 0205  AST 22 58* 41  ALT 25 75* 88*  ALKPHOS 99 107 98  BILITOT 1.1 1.0 0.6  PROT 6.1* 6.3* 6.2*  ALBUMIN 2.4* 2.4* 2.4*    CBG: No results for input(s): GLUCAP in the last 168  hours.  Microbiology Studies:   Recent Results (from the past 240 hour(s))  Culture, blood (routine x 2)     Status: None   Collection Time: 05/07/2020  5:12 PM   Specimen: BLOOD  Result Value Ref Range Status   Specimen Description BLOOD BLOOD LEFT WRIST  Final   Special Requests   Final    BOTTLES DRAWN AEROBIC AND ANAEROBIC Blood Culture results may not be optimal due to an inadequate volume of blood received in culture bottles   Culture   Final    NO GROWTH 5 DAYS Performed at [facility], [place] [address], [place], [place] [address]    Report Status 05/03/2020 FINAL  Final  Culture, blood (routine x 2)     Status: None   Collection Time: 05/16/2020  7:20 PM   Specimen: BLOOD  Result Value Ref Range Status   Specimen Description BLOOD RIGHT ANTECUBITAL  Final   Special Requests   Final    BOTTLES DRAWN AEROBIC ONLY Blood Culture results may not be optimal due to an inadequate volume of blood received in culture bottles   Culture   Final    NO GROWTH 5 DAYS Performed at [facility], [place] [address] [place], [place] [address]    Report Status 05/03/2020 FINAL  Final  MRSA PCR Screening     Status: None   Collection Time: 04/30/20 11:20 AM   Specimen: Nasal Mucosa; Nasopharyngeal  Result Value Ref Range Status   MRSA by PCR NEGATIVE NEGATIVE Final    Comment:        The GeneXpert MRSA Assay (FDA approved for NASAL specimens only), is one component of a comprehensive MRSA colonization surveillance program. It is not intended to diagnose MRSA infection nor to guide or monitor treatment for MRSA infections. Performed at [facility], [place] [address], [place], [place] [address]   Culture, Urine     Status: None   Collection Time: 04/30/20 11:26 AM   Specimen: Urine, Clean Catch  Result Value Ref Range Status   Specimen Description URINE, CLEAN CATCH  Final   Special Requests NONE  Final   Culture   Final    NO GROWTH Performed at [facility],  [address] [place], [place] [address]    Report Status 05/01/2020 FINAL  Final  Expectorated sputum assessment w rflx to resp cult     Status: None   Collection Time: 05/01/20  8:39 PM   Specimen: Sputum  Result Value Ref Range Status   Specimen Description SPUTUM  Final   Special Requests NONE  Final   Sputum evaluation   Final    THIS SPECIMEN IS ACCEPTABLE FOR SPUTUM CULTURE Performed at [facility], [place] [address], [place], [place] [address]    Report Status 05/02/2020 FINAL  Final  Culture, respiratory     Status: None   Collection Time: 05/01/20  8:39 PM   Specimen: SPU  Result Value Ref Range Status   Specimen Description SPUTUM  Final   Special Requests NONE Reflexed from [id number]  Final   Gram Stain   Final    FEW WBC PRESENT, PREDOMINANTLY PMN MODERATE YEAST MODERATE GRAM POSITIVE COCCI IN PAIRS IN CLUSTERS FEW GRAM NEGATIVE RODS Performed at [facility], [place] [address], [place],  [address]    Culture ABUNDANT CANDIDA ALBICANS  Final   Report Status 05/04/2020 FINAL  Final     Radiology Studies:  No results found.   Scheduled Meds:   . (feeding supplement) PROSource Plus  30 mL Oral BID BM  . metoprolol tartrate  25 mg Oral BID  . [START ON 05/14/20] pantoprazole  80 mg Oral Daily  . traZODone  50 mg Oral QHS    Continuous Infusions:     LOS: 7 days     Vernell Leep, MD, [place], [facility]. [facility]    To contact the attending provider between 7A-7P or the covering provider during after hours 7P-7A, please log into the web site [email or web address] and access using universal [place] password for that web site. If you do not have the password, please call the hospital operator.  05/05/2020, 3:48 PM

## 2020-05-05 NOTE — Consult Note (Signed)
NAME:  Amanda Ross, MRN:  [id number], DOB:  August 02, 1945, LOS: 7 ADMISSION DATE:  04/27/2020, CONSULTATION DATE:  11/18 REFERRING MD:  Dr. Algis Liming, CHIEF COMPLAINT:  Respiratory failure    Brief History   74yo with post COVID fibrosis   History of present illness   Amanda Ross is a 74 year old female with a past medical history significant for severe Covid pneumonia, obstructive sleep apnea, hypertension, GERD, and asthma who presented to the emergency department on 11/11 due to progressive shortness of breath that began 1 week post discharge.  Patient also endorsed orthopnea and cough.  During admission thus far patient has received aggressive diuretics with IV Lasix, course of antibiotics, full dose anticoagulation (for acute DVT) and IV steroids with no improvement in high oxygen supplementation needs. Therefore she underwent a chest CT 11/13 that revealed severe pulmonary scaring and distortion of pulmonary architecture. Therefore palliative care was called and patient was made a DNR.   PCCM was officially consulted 11/18 for assistance in transition to comfort measured in a single organ failure.   Past Medical History  Severe Covid pneumonia, obstructive sleep apnea, hypertension, GERD, and asthma  Significant Hospital Events   Admitted 11/11 Transitioned to DNR 11/13  Consults:  Cardiology, Palliative, PCCM  Procedures:    Significant Diagnostic Tests:  Chest CT 11/13 > 1. Interval development of severe bilateral pulmonary parenchymal disease compared to the relatively recent prior imaging dated 03/25/2020. There is new significant bronchiectasis throughout the posterior aspect of the right upper lobe, the right lower lobe and within the periphery of the left upper lobe. The associated pulmonary parenchyma is nearly completely replaced with interstitial and confluent airspace opacities. Findings are most consistent with severe post COVID scarring and architectural  distortion. 2. New emphysematous changes in the right upper lobe near the apex. 3. No evidence of pleural effusion or pneumothorax. 4. Increasing prominence of mediastinal lymph nodes are almost certainly reactive in nature. 5. Enlarged main pulmonary artery suggests developing pulmonary arterial hypertension. 6. Coronary artery and aortic atherosclerotic calcifications.  Micro Data:  MRSA PCR > Negative  Urine culture 11/13 >Negative  Sputum culture 11/14 > Negative  Blood culture 11/11 > Negative  Sputum culture 11/14 > Abundant candida albicans   Antimicrobials:  Cefepime 11/11 Levofloxacin 11/11 >11/16 Vancomycin 11/12  Interim history/subjective:  Patient reports dyspnea and anxiety on minimal exertion which is confirmed by the family.  Objective   Blood pressure 122/75, pulse 86, temperature 98.5 F (36.9 C), temperature source Axillary, resp. rate 18, height 5\' 3"  (1.6 m), weight 96.1 kg, SpO2 (!) 85 %.        Intake/Output Summary (Last 24 hours) at 05/05/2020 1416 Last data filed at 05/04/2020 2337 Gross per 24 hour  Intake 120 ml  Output 500 ml  Net -380 ml   Filed Weights   04/21/2020 1531  Weight: 96.1 kg    Examination: General: Obese woman lying in bed.  Appears asthenic.  Generalized muscle loss HENT: Mucous membranes are moist Lungs: Crackles at both bases.  Minimal accessory muscle use.  On high flow oxygen and nonrebreather Cardiovascular: JVD to 4 cm above sternal angle loud P2.  No murmurs.  No peripheral edema. Abdomen: Soft and nontender Extremities: Warm and well perfused no clubbing Neuro: Awake alert slow to respond.  Generalized weakness.  No focal deficits.   Resolved Hospital Problem list   N/A  Assessment & Plan:   Agree with prior assessment of progressive fibrosis related to prior COVID-19  pneumonia. At this stage there is no intervention that would substantially alter the patient's disease trajectory.  She has received several  courses of antibiotics and while diuresis may temporarily improve her oxygenation, it is doubtful that it would significantly decrease her oxygen requirement such that she would be able to be discharged home.  Agree furthermore that comfort care is the most appropriate course of action.  In this case there are 2 possible approaches to take.  The first is to adopt a reactive approach to palliation, allowing the patient to continue on her current levels of oxygen support until disease continues to progress or she decompensates from another cause and then transition to active palliation. Alternatively, one can take a more proactive approach and initiate sedation to decrease air hunger as oxygen is progressively weaned off.  While this approach will certainly result in a shorter life expectancy it would definitely decrease her burden of suffering. Indeed, although the patient appears at first glance to be reasonably comfortable, she appears to me to actually be suffering quite a bit as she is unable to move without anxiety and dyspnea.  As such I believe that a more proactive approach would be preferable.  I have outlined the above with the family and they are going to discuss amongst themselves which approach they feel they would like to pursue.  Best practice:  Diet: Comfort feed Pain/Anxiety/Delirium protocol (if indicated): As needed  VAP protocol (if indicated): N/A DVT prophylaxis: SCD GI prophylaxis: PPI Glucose control: Monitor  Mobility: Up as desired  Code Status: DNR Family Communication: Updated at bedside Disposition: Palliative floor  Labs   CBC: Recent Labs  Lab 04/29/20 0205 04/30/20 0313 05/01/20 0223 05/02/20 0328 05/03/20 0205  WBC 8.0 9.1 13.5* 16.4* 14.8*  NEUTROABS 5.1 6.7 12.2* 14.4* 12.6*  HGB 11.2* 11.8* 12.4 12.7 12.8  HCT 35.1* 35.6* 36.6 38.3 38.7  MCV 96.4 95.2 92.4 95.0 94.9  PLT 157 207 241 357 841    Basic Metabolic Panel: Recent Labs  Lab  04/29/20 0205 04/30/20 0313 05/01/20 0223 05/02/20 0328 05/03/20 0205  NA 143 140 140 140 143  K 4.1 4.3 4.3 4.5 4.5  CL 108 104 103 100 103  CO2 26 27 26 29 30   GLUCOSE 84 102* 123* 157* 124*  BUN 14 19 21  39* 50*  CREATININE 0.98 1.14* 1.06* 1.32* 1.34*  CALCIUM 10.7* 10.6* 11.3* 12.0* 12.2*  MG 1.9 1.6* 2.2 2.4 2.1   GFR: Estimated Creatinine Clearance: 40.6 mL/min (A) (by C-G formula based on SCr of 1.34 mg/dL (H)). Recent Labs  Lab 04/20/2020 1536 05/09/2020 1712 05/10/2020 1920 04/29/20 0205 04/29/20 0205 04/30/20 0313 05/01/20 0223 05/02/20 0328 05/02/20 1100 05/03/20 0205  PROCALCITON   < >  --  0.13 <0.10  --  0.34  --   --  0.45 0.37  WBC   < >  --   --  8.0   < > 9.1 13.5* 16.4*  --  14.8*  LATICACIDVEN  --  1.3 1.8  --   --   --   --   --   --   --    < > = values in this interval not displayed.    Liver Function Tests: Recent Labs  Lab 04/29/20 0205 04/30/20 0313 05/01/20 0223 05/02/20 0328 05/03/20 0205  AST 16 15 22  58* 41  ALT 19 18 25  75* 88*  ALKPHOS 73 91 99 107 98  BILITOT 1.3* 1.0 1.1 1.0 0.6  PROT  5.4* 5.5* 6.1* 6.3* 6.2*  ALBUMIN 2.6* 2.4* 2.4* 2.4* 2.4*   No results for input(s): LIPASE, AMYLASE in the last 168 hours. No results for input(s): AMMONIA in the last 168 hours.  ABG No results found for: PHART, PCO2ART, PO2ART, HCO3, TCO2, ACIDBASEDEF, O2SAT   Coagulation Profile: Recent Labs  Lab 05/16/2020 1536  INR 1.2    Cardiac Enzymes: No results for input(s): CKTOTAL, CKMB, CKMBINDEX, TROPONINI in the last 168 hours.  HbA1C: Hemoglobin A1C  Date/Time Value Ref Range Status  02/19/2018 12:00 AM 5.5  Final    CBG: No results for input(s): GLUCAP in the last 168 hours.  Review of Systems:   Review of Systems  Constitutional: Negative.   HENT: Negative.   Respiratory: Positive for cough and shortness of breath.   Cardiovascular: Negative.   Gastrointestinal: Negative.   Genitourinary: Negative.   Musculoskeletal:  Negative.   Skin: Negative.   Neurological: Positive for dizziness.  Psychiatric/Behavioral: Negative.      Past Medical History  She,  has a past medical history of Asthma, GERD (gastroesophageal reflux disease), H/O bladder infections, H/O measles, H/O varicella, Headache(784.0), Hypertension, OSA (obstructive sleep apnea), Osteoarthritis, and Yeast infection.   Surgical History    Past Surgical History:  Procedure Laterality Date  . BUNIONECTOMY    . CARPAL TUNNEL RELEASE    . dental work  08/2019  . LEFT HEART CATH AND CORONARY ANGIOGRAPHY N/A 04/15/2020   Procedure: LEFT HEART CATH AND CORONARY ANGIOGRAPHY;  Surgeon: Dixie Dials, MD;  Location: [facility];  Service: Cardiovascular;  Laterality: N/A;  . TOTAL SHOULDER ARTHROPLASTY       Social History   reports that she quit smoking about 14 years ago. Her smoking use included cigarettes. She has a 40.00 pack-year smoking history. She has never used smokeless tobacco. She reports previous alcohol use. She reports that she does not use drugs.   Family History   Her family history includes Asthma in her daughter; Deep vein thrombosis in her mother; Heart disease in her brother and mother; Rheum arthritis in her mother and sister.   Allergies Allergies  Allergen Reactions  . Ivp Dye [Iodinated Diagnostic Agents] Hives  . Percocet [Oxycodone-Acetaminophen] Nausea And Vomiting  . Iohexol Hives  . Penicillins Hives    Did it involve swelling of the face/tongue/throat, SOB, or low BP? N Did it involve sudden or severe rash/hives, skin peeling, or any reaction on the inside of your mouth or nose? Y Did you need to seek medical attention at a hospital or doctor's office? N When did it last happen?Several Years Ago If all above answers are "NO", may proceed with cephalosporin use.   . Prednisone Nausea Only     Home Medications  Prior to Admission medications   Medication Sig Start Date End Date Taking?  Authorizing Provider  albuterol (VENTOLIN HFA) 108 (90 Base) MCG/ACT inhaler Inhale 1-2 puffs into the lungs every 6 (six) hours as needed for wheezing or shortness of breath. 03/23/20  Yes Bast, Traci A, NP  aspirin 81 MG tablet Take 81 mg by mouth daily.   Yes [provider]  atorvastatin (LIPITOR) 10 MG tablet Take 1 tablet (10 mg total) by mouth daily. 04/16/20  Yes Ghimire, Henreitta Leber, MD  baclofen (LIORESAL) 10 MG tablet TAKE 1/2 TABLET BY MOUTH TWICE DAILY AS NEEDED Patient taking differently: Take 5 mg by mouth in the morning and at bedtime.  02/01/20  Yes [name], MD  Cholecalciferol (  VITAMIN D3) 50 MCG (2000 UT) TABS Take 2,000 Units by mouth daily.   Yes [provider]  diclofenac Sodium (VOLTAREN) 1 % GEL Apply 2 g to 4 g to affected area up to 4 times daily as needed. Patient taking differently: Apply 2 g topically 4 (four) times daily as needed (to painful sites).  05/18/19  Yes Ofilia Neas, PA-C  Ensure (ENSURE) Take 237 mLs by mouth 2 (two) times daily between meals.   Yes [provider]  hydroxychloroquine (PLAQUENIL) 200 MG tablet Take 1 tablet (200 mg total) by mouth every morning. 03/07/20  Yes Ofilia Neas, PA-C  ipratropium (ATROVENT) 0.03 % nasal spray Place 2 sprays into both nostrils as needed. Patient taking differently: Place 2 sprays into both nostrils daily as needed for rhinitis.  10/27/19  Yes [name], MD  magnesium gluconate (MAGONATE) 500 MG tablet Take 500 mg by mouth daily.    Yes [provider]  metoprolol tartrate (LOPRESSOR) 25 MG tablet TAKE 1/2 TABLET BY MOUTH TWICE DAILY Patient taking differently: Take 12.5 mg by mouth 2 (two) times daily.  01/07/20  Yes [name], MD  omeprazole (PRILOSEC) 20 MG capsule TAKE 2 CAPSULES(40 MG) BY MOUTH DAILY Patient taking differently: Take 40 mg by mouth daily.  09/14/19  Yes [name], MD  oxybutynin (DITROPAN-XL) 10 MG 24 hr tablet Take 10 mg by mouth at  bedtime.   Yes [provider]  Potassium 99 MG TABS Take 99 mg by mouth daily.    Yes [provider]  traZODone (DESYREL) 50 MG tablet TAKE 1 TABLET BY MOUTH EVERY NIGHT AT BEDTIME Patient taking differently: Take 50 mg by mouth at bedtime.  10/12/19  Yes Minette Brine, FNP  Nutritional Supplements (,FEEDING SUPPLEMENT, PROSOURCE PLUS) liquid Take 30 mLs by mouth 2 (two) times daily between meals. Patient not taking: Reported on 05/12/2020 04/16/20 05/16/20  Jonetta Osgood, MD    Kipp Brood, MD [facility] ICU Physician [place]  Pager: [phone] Or Epic Secure Chat After hours: [phone].  05/05/2020, 4:49 PM

## 2020-05-05 NOTE — Progress Notes (Signed)
Daily Progress Note   Patient Name: Amanda Ross       Date: 05/05/2020 DOB: [birth year]  Age: 74 y.o. MRN#: [id number] Attending Physician: Modena Jansky, MD Primary Care Physician: [name], MD Admit Date: 05/14/2020  Reason for Consultation/Follow-up:  To discuss complex medical decision making related to patient's goals of care  Discussed with Drs. Hongalgi, Lonia Skinner, and Pulmonary NP Rosana Hoes.  Subjective: Visited with patient at bedside.   She is alert and orientated but appears fatigued under her NRB mask.  Husband, pastor and close family friend are in the room with her.   She tells me she is doing OK, no complaints.  [name] her husband, tells me she is not eating.    I talked with him about family visiting.  She has relatives coming in today and over the weekend.  I indicated that perhaps the family should visit thru the weekend and we would consider compassionately weaning on Monday.   [name] very sadly agreed.   Assessment:  15L HFNC with NRB mask FiO2 at 100% sats in the mid 80s.  Patient appears comfortable but fatigued.  It feels very unnatural to be talking about compassionately weaning Mrs. Baxley when she appears so alert an orientated.  Patient Profile/HPI:  Per intake H&P -->Amanda Ross is a 74 y.o. female, with recent treated Covid 19 PNA >3 weeks ago, Sjogren's syndrome, hypertension, asthma, left thyroid nodule,diastolic heart failure. Shedischarged about 3-1/2 weeks ago after treatment of COVID-19 pneumonia now hasprogressive shortness of breath which started about a week after she went home. Identified to havesignificant parenchymal lung injuryd/t COVID-19 has a very poor prognosis.    Length of Stay: 7   Vital Signs: BP 122/75 (BP  Location: Right Arm)   Pulse 86   Temp 98.5 F (36.9 C) (Axillary)   Resp 18   Ht 5\' 3"  (1.6 m)   Wt 96.1 kg   SpO2 (!) 85%   BMI 37.53 kg/m  SpO2: SpO2: (!) 85 % O2 Device: O2 Device: Nasal Cannula O2 Flow Rate: O2 Flow Rate (L/min): 15 L/min       Palliative Assessment/Data:  20%     Palliative Care Plan    Recommendations/Plan:  Continue current care focused on her comfort and symptom management for now.  PMT will continue to check  in daily.  Considering Monday for compassionate wean if there is no improvement in her oxygen requirements.  Code Status:  DNR  Prognosis:   < 2 weeks   Discharge Planning:  Anticipated Hospital Death  Care plan was discussed with Family, Drs. Hongalgi, Lonia Skinner, and Pulmonary NP Rosana Hoes.  Thank you for allowing the Palliative Medicine Team to assist in the care of this patient.  Total time spent:  35 min.     Greater than 50%  of this time was spent counseling and coordinating care related to the above assessment and plan.  Florentina Jenny, PA-C Palliative Medicine  Please contact Palliative MedicineTeam phone at [phone] for questions and concerns between 7 am - 7 pm.   Please see AMION for individual provider pager numbers.

## 2020-05-05 NOTE — Progress Notes (Signed)
Patient's daughter is requesting an air mattress since her mother is refusing staff to turn her every 2 hours.

## 2020-05-06 DIAGNOSIS — Z7189 Other specified counseling: Secondary | ICD-10-CM | POA: Diagnosis not present

## 2020-05-06 DIAGNOSIS — Z515 Encounter for palliative care: Secondary | ICD-10-CM | POA: Diagnosis not present

## 2020-05-06 DIAGNOSIS — J9601 Acute respiratory failure with hypoxia: Secondary | ICD-10-CM | POA: Diagnosis not present

## 2020-05-06 MED ORDER — HYDROMORPHONE HCL 1 MG/ML IJ SOLN
0.2500 mg | Freq: Four times a day (QID) | INTRAMUSCULAR | Status: DC
  Administered 2020-05-06 – 2020-05-08 (×6): 0.25 mg via INTRAVENOUS
  Filled 2020-05-06 (×7): qty 1

## 2020-05-06 MED ORDER — LORAZEPAM 2 MG/ML IJ SOLN
0.2500 mg | Freq: Two times a day (BID) | INTRAMUSCULAR | Status: DC
  Administered 2020-05-06 – 2020-05-07 (×4): 0.25 mg via INTRAVENOUS
  Filled 2020-05-06 (×4): qty 1

## 2020-05-06 MED ORDER — TRAZODONE HCL 100 MG PO TABS
100.0000 mg | ORAL_TABLET | Freq: Every day | ORAL | Status: DC
  Administered 2020-05-06 – 2020-05-07 (×2): 100 mg via ORAL
  Filled 2020-05-06 (×2): qty 1

## 2020-05-06 MED ORDER — ENSURE ENLIVE PO LIQD
237.0000 mL | Freq: Two times a day (BID) | ORAL | Status: DC
  Administered 2020-05-06: 237 mL via ORAL

## 2020-05-06 MED ORDER — HYDROMORPHONE HCL 1 MG/ML IJ SOLN: 0.2500 mg | Freq: Four times a day (QID) | INTRAMUSCULAR | Status: DC

## 2020-05-06 NOTE — Progress Notes (Signed)
PROGRESS NOTE   Amanda Ross  IOE:[id number]    DOB: 09-06-1945    DOA: 04/30/2020  PCP: Amanda Chard, MD   I have briefly reviewed patients previous medical records in [facility].  Chief Complaint  Patient presents with  . Shortness of Breath    Brief Narrative:  74 year old female, recently treated for COVID-19 pneumonia >3 weeks prior to this admission, Sjogren's syndrome, hypertension, asthma, left thyroid nodule, presented to the ER with gradually progressive dyspnea that started about a week after she was discharged home, also noticed left lower extremity swelling, orthopnea.  Admitted for acute hypoxic respiratory failure, extremely elevated D-dimer, acute left lower extremity DVT, unable to do CTA chest due to IV contrast dye allergy.  Despite all aggressive measures, continued to do poorly, determined to have poor prognosis, transitioned to comfort care on 05/03/2020 and palliative care medicine was consulted.  However she still remains on some of her p.o. meds and on high flow nasal cannula oxygen and NRB oxygen.  Rapidly declining now.   Assessment & Plan:  Active Problems:   Acute hypoxemic respiratory failure (HCC)   Palliative care by specialist   Goals of care, counseling/discussion   DNR (do not resuscitate)   Pressure injury of skin   Comfort measures only status   Pulmonary embolus ([place])   Acute respiratory failure with hypoxia, suspected due to COVID-19 related lung injury: Recently treated for severe COVID-19 pneumonia.  Per CT chest, severe parenchymal lung injury.  Despite aggressive treatment including IV Lasix, course of antibiotics, full dose anticoagulation for DVT and IV steroids, she continued to be severely hypoxic.  Her case was discussed with PCCM who reviewed the CT chest and agreed that patiently likely had incurred life-threatening and severe parenchymal lung injury which was most likely not reversible.  Prior hospitalist MD discussed her  grave prognosis with patient's family, she was transitioned to DNR and eventually to full comfort care on 05/03/2020.  Palliative care medicine was consulted.  Remains on IV Solu-Medrol.  Will defer to PMT regarding discontinuing medications nonessential for comfort.  As per PMT input 11/16, awaiting extended family members to arrive from out of state.  On 11/18, discussed with [name], Palliative Medicine PA-C and with Dr. Lane Hacker, Palliative Medicine Director.  Very difficult and painful situation in that, the patient is alert and oriented but remains dependent on high volume oxygen.  Dr. Hilma Favors and I agreed that it is reasonable to get PCCM to review case, options going forward regarding any further treatable options, measures to reduce oxygen needs so she can consider DC home or residential hospice which would not currently accept her with high oxygen needs versus ultimate option of terminal oxygen wean which will need to be done under some form of sedation.  PCCM, Dr. Michelle Piper input much appreciated.  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  Discussed with [name], her input appreciated, she met with family, patient declining with worsening respiratory status-mild to moderately labored, getting fatigued, plan is to continue high flow oxygen without weaning (patient and family extremely anxious of weaning down), added scheduled opioids and benzodiazepines for pain and anxiety.  It is felt that patient herself is declining and patient/family may not have to make the difficult decision of compassionate weaning.  CAP versus acute bronchitis: Completed course  of levofloxacin.  GERD: Continue PPI.  Elevated troponin: Likely due to demand ischemia from acute respiratory failure.  Low  likelihood for ACS.  Echo showed preserved EF without wall motion abnormalities.  Remains on metoprolol twice daily.  Will defer to PMT regarding discontinuing all medications nonessential for comfort.  Body mass index is 37.53 kg/m./Morbid obesity  Sjogren's syndrome: On Plaquenil PTA.  Currently on hold.  OSA  Dyslipidemia  Essential hypertension Controlled on metoprolol  Acute on chronic diastolic CHF Echo with LVEF 60%.  S/p IV Lasix.  Clinically euvolemic.  Developing pulmonary hypertension Noted on echo.  AKI:    DVT prophylaxis:   None.  Now on full comfort care.   Code Status: DNR Family Communication: Discussed in detail with patient's extended family at bedside followed by another extended meeting with multiple family members outside patient's room, updated care and answered all questions.  They appeared satisfied. Disposition:  Status is: Inpatient  Remains inpatient appropriate because:Inpatient level of care appropriate due to severity of illness   Dispo: The patient is from: Home              Anticipated d/c is to: ?  Anticipated hospital death              Anticipated d/c date is: 3 days              Patient currently is not medically stable to d/c.        Consultants:   PCCM Palliative care medicine  Procedures:   None  Antimicrobials:    Anti-infectives (From admission, onward)   Start     Dose/Rate Route Frequency Ordered Stop   05/02/20 1000  levofloxacin (LEVAQUIN) tablet 500 mg        500 mg Oral Daily 05/02/20 0729 05/03/20 0933   04/29/20 1000  hydroxychloroquine (PLAQUENIL) tablet 200 mg  Status:  Discontinued        200 mg Oral  Every morning - 10a 05/11/2020 1723 05/03/20 1239   04/29/20 0600  vancomycin (VANCOREADY) IVPB 750 mg/150 mL  Status:  Discontinued        750 mg 150 mL/hr over 60 Minutes Intravenous Every 12 hours 04/23/2020 1728 04/29/20 1022   04/21/2020 1745  levofloxacin (LEVAQUIN) tablet 750 mg        750 mg Oral Daily  04/25/2020 1738 05/02/20 0959   04/26/2020 1715  ceFEPIme (MAXIPIME) 2 g in sodium chloride 0.9 % 100 mL IVPB        2 g 200 mL/hr over 30 Minutes Intravenous  Once 05/02/2020 1712 05/03/2020 1825   04/29/2020 1715  vancomycin (VANCOREADY) IVPB 2000 mg/400 mL        2,000 mg 200 mL/hr over 120 Minutes Intravenous  Once 05/01/2020 1714 04/24/2020 2054        Subjective:  "I am okay".  Does report dyspnea and chest discomfort with breathing.  Eating and drinking some.  Objective:   Vitals:   05/04/20 2336 05/05/20 1136 05/05/20 1414 05/06/20 0544  BP: 115/70  122/75 (!) 141/90  Pulse: 79 90 86 90  Resp: '17 18  18  ' Temp: 98.7 F (37.1 C) 98.7 F (37.1 C) 98.5 F (36.9 C) (!) 97.5 F (36.4 C)  TempSrc: Oral Axillary Axillary Oral  SpO2: 93% (!) 87% (!) 85% 94%  Weight:      Height:        General exam: Elderly female, moderately built and nourished, lying propped up in bed, looks worse  compared to the last 2 days, appears more dyspneic and fatigued.  Worn out. Respiratory system: Harsh and diminished breath sounds bilaterally.  No wheezing or rhonchi.  Occasional crackles.  Mild to moderate increased work of breathing, especially with minimal activity.  Is on [place] oxygen at 13 L/min and NRB at 15 L/min. Cardiovascular system: S1 & S2 heard, RRR. No JVD, murmurs, rubs, gallops or clicks. No pedal edema. Gastrointestinal system: Abdomen is nondistended, soft and nontender. No organomegaly or masses felt. Normal bowel sounds heard. Central nervous system: Alert and oriented x2. No focal neurological deficits. Extremities: Symmetric 5 x 5 power. Skin: No rashes, lesions or ulcers Psychiatry: Judgement and insight appear normal. Mood & affect appropriate.     Data Reviewed:   I have personally reviewed following labs and imaging studies   CBC: Recent Labs  Lab 05/01/20 0223 05/02/20 0328 05/03/20 0205  WBC 13.5* 16.4* 14.8*  NEUTROABS 12.2* 14.4* 12.6*  HGB 12.4 12.7 12.8  HCT 36.6  38.3 38.7  MCV 92.4 95.0 94.9  PLT 241 357 067    Basic Metabolic Panel: Recent Labs  Lab 05/01/20 0223 05/02/20 0328 05/03/20 0205  NA 140 140 143  K 4.3 4.5 4.5  CL 103 100 103  CO2 '26 29 30  ' GLUCOSE 123* 157* 124*  BUN 21 39* 50*  CREATININE 1.06* 1.32* 1.34*  CALCIUM 11.3* 12.0* 12.2*  MG 2.2 2.4 2.1    Liver Function Tests: Recent Labs  Lab 05/01/20 0223 05/02/20 0328 05/03/20 0205  AST 22 58* 41  ALT 25 75* 88*  ALKPHOS 99 107 98  BILITOT 1.1 1.0 0.6  PROT 6.1* 6.3* 6.2*  ALBUMIN 2.4* 2.4* 2.4*    CBG: No results for input(s): GLUCAP in the last 168 hours.  Microbiology Studies:   Recent Results (from the past 240 hour(s))  Culture, blood (routine x 2)     Status: None   Collection Time: 05/11/2020  5:12 PM   Specimen: BLOOD  Result Value Ref Range Status   Specimen Description BLOOD BLOOD LEFT WRIST  Final   Special Requests   Final    BOTTLES DRAWN AEROBIC AND ANAEROBIC Blood Culture results may not be optimal due to an inadequate volume of blood received in culture bottles   Culture   Final    NO GROWTH 5 DAYS Performed at [facility], [place] [address], [place], [place] [address]    Report Status 05/03/2020 FINAL  Final  Culture, blood (routine x 2)     Status: None   Collection Time: 04/25/2020  7:20 PM   Specimen: BLOOD  Result Value Ref Range Status   Specimen Description BLOOD RIGHT ANTECUBITAL  Final   Special Requests   Final    BOTTLES DRAWN AEROBIC ONLY Blood Culture results may not be optimal due to an inadequate volume of blood received in culture bottles   Culture   Final    NO GROWTH 5 DAYS Performed at [facility], [place] [address] [place], [place] [address]    Report Status 05/03/2020 FINAL  Final  MRSA PCR Screening     Status: None   Collection Time: 04/30/20 11:20 AM   Specimen: Nasal Mucosa; Nasopharyngeal  Result Value Ref Range Status   MRSA by PCR NEGATIVE NEGATIVE Final    Comment:        The GeneXpert MRSA  Assay (FDA approved for NASAL specimens only), is one component of a comprehensive MRSA colonization surveillance program. It  is not intended to diagnose MRSA infection nor to guide or monitor treatment for MRSA infections. Performed at [facility], [place] [address], [place], [place] [address]   Culture, Urine     Status: None   Collection Time: 04/30/20 11:26 AM   Specimen: Urine, Clean Catch  Result Value Ref Range Status   Specimen Description URINE, CLEAN CATCH  Final   Special Requests NONE  Final   Culture   Final    NO GROWTH Performed at [facility], [place] [address] [place], [place] [address]    Report Status 05/01/2020 FINAL  Final  Expectorated sputum assessment w rflx to resp cult     Status: None   Collection Time: 05/01/20  8:39 PM   Specimen: Sputum  Result Value Ref Range Status   Specimen Description SPUTUM  Final   Special Requests NONE  Final   Sputum evaluation   Final    THIS SPECIMEN IS ACCEPTABLE FOR SPUTUM CULTURE Performed at [facility], [place] [address] [place], [place] [address]    Report Status 05/02/2020 FINAL  Final  Culture, respiratory     Status: None   Collection Time: 05/01/20  8:39 PM   Specimen: SPU  Result Value Ref Range Status   Specimen Description SPUTUM  Final   Special Requests NONE Reflexed from [id number]  Final   Gram Stain   Final    FEW WBC PRESENT, PREDOMINANTLY PMN MODERATE YEAST MODERATE GRAM POSITIVE COCCI IN PAIRS IN CLUSTERS FEW GRAM NEGATIVE RODS Performed at [facility], [place] [address] [place], [place] [address]    Culture ABUNDANT CANDIDA ALBICANS  Final   Report Status 05/04/2020 FINAL  Final     Radiology Studies:  No results found.   Scheduled Meds:   . feeding supplement  237 mL Oral BID BM  .  HYDROmorphone (DILAUDID) injection  0.25 mg Intravenous Q6H  . LORazepam  0.25 mg Intravenous BID  . metoprolol tartrate  25 mg Oral BID  . [START ON 05/25/20] pantoprazole  80 mg  Oral Daily  . traZODone  100 mg Oral QHS    Continuous Infusions:     LOS: 8 days     Vernell Leep, MD, [place], [facility]. [facility]    To contact the attending provider between 7A-7P or the covering provider during after hours 7P-7A, please log into the web site [email or web address] and access using universal [place] password for that web site. If you do not have the password, please call the hospital operator.  05/06/2020, 4:49 PM

## 2020-05-06 NOTE — Plan of Care (Signed)
  Problem: Pain Managment: Goal: General experience of comfort will improve Outcome: Progressing   Problem: Safety: Goal: Ability to remain free from injury will improve Outcome: Progressing   

## 2020-05-06 NOTE — Progress Notes (Signed)
Daily Progress Note   Patient Name: Amanda Ross       Date: 05/06/2020 DOB: 1946/02/19  Age: 74 y.o. MRN#: [id number] Attending Physician: Modena Jansky, MD Primary Care Physician: [name], MD Admit Date: 05/16/2020  Reason for Consultation/Follow-up: To discuss complex medical decision making related to patient's goals of care  Patient appears more fatigued today.  Breathing is mild to moderately labored.  Subjective: Talked with patient at bedside.   Husband and two older daughters from [place] are present.   I asked patient about her symptoms.  She complains of pain in her chest from trying to breathe.    We talked about the options presented to the family yesterday - (1) continue current care and gradually allow her body to do what its going to do naturally; (2)  Allow family to visit and then determine a time to wean the oxygen.    Patient is able to express that she is VERY afraid of having the oxygen weaned.  She does not want to feel respiratory distress.   I told her to not think about weaning the oxygen any longer.  She does not need to have anxiety about that.  Daughters expressed that they wanted their mother to continue as long as she was feeling ok.  [name] came to bedside and expressed that he is watching his wife decline every day and he DOES NOT want her to suffer.   He is worried about a prolonged period of suffering.  We decided to schedule low dose pain medication to ease the chest pain and her breathing.    Sons are arriving tomorrow to visit.  Family hopeful that she will be alert and able to visit with them.  Family requests strawberry ensure for the patient.   Assessment: Patient growing fatigued despite large quantities of oxygen.  Remains on 15L plus  NRB mask.   Describes chest pain today.   Patient Profile/HPI:  Per intake H&P -->Amanda Ross is a 74 y.o. female, with recent treated Covid 19 PNA >3 weeks ago, Sjogren's syndrome, hypertension, asthma, left thyroid nodule,diastolic heart failure. Shedischarged about 3-1/2 weeks ago after treatment of COVID-19 pneumonia now hasprogressive shortness of breath which started about a week after she went home. Identified to havesignificant parenchymal lung injuryd/t COVID-19 has a very poor prognosis.  Length of Stay: 8   Vital Signs: BP (!) 141/90 (BP Location: Right Arm)   Pulse 90   Temp (!) 97.5 F (36.4 C) (Oral)   Resp 18   Ht 5\' 3"  (1.6 m)   Wt 96.1 kg   SpO2 94%   BMI 37.53 kg/m  SpO2: SpO2: 94 % O2 Device: O2 Device: Nasal Cannula O2 Flow Rate: O2 Flow Rate (L/min): 15 L/min       Palliative Assessment/Data: 20%     Palliative Care Plan    Recommendations/Plan:  Do not wean oxygen.  Will schedule very low dose dilaudid q 4 hours.  In addition she has PRN dosing.  Will schedule very low dose benzodiazepine bid.  Increase trazodone at QHS to 100 mg.  Will follow supportively.  I believe [name] will unfortunately fatigue and her body will declare itself.  Then a wean will be appropriate for patient and family without the medical team having to schedule a compassionate wean.   Code Status:  DNR  Prognosis:   Days.  Discharge Planning:  Anticipated Hospital Death would consider transfer to Hospice facility if she is comfortable on 14L HFNC or less.  Care plan was discussed with   Thank you for allowing the Palliative Medicine Team to assist in the care of this patient.  Total time spent:  35 min.     Greater than 50%  of this time was spent counseling and coordinating care related to the above assessment and plan.  Florentina Jenny, PA-C Palliative Medicine  Please contact Palliative MedicineTeam phone at [phone] for questions  and concerns between 7 am - 7 pm.   Please see AMION for individual provider pager numbers.

## 2020-05-07 DIAGNOSIS — Z66 Do not resuscitate: Secondary | ICD-10-CM | POA: Diagnosis not present

## 2020-05-07 DIAGNOSIS — Z515 Encounter for palliative care: Secondary | ICD-10-CM | POA: Diagnosis not present

## 2020-05-07 DIAGNOSIS — Z7189 Other specified counseling: Secondary | ICD-10-CM | POA: Diagnosis not present

## 2020-05-07 NOTE — Progress Notes (Signed)
PROGRESS NOTE   Amanda Ross  ZRA:[id number]    DOB: [birth year]    DOA: 04/18/2020  PCP: Amanda Chard, MD   I have briefly reviewed patients previous medical records in [facility].  Chief Complaint  Patient presents with  . Shortness of Breath    Brief Narrative:  74 year old female, recently treated for COVID-19 pneumonia >3 weeks prior to this admission, Sjogren's syndrome, hypertension, asthma, left thyroid nodule, presented to the ER with gradually progressive dyspnea that started about a week after she was discharged home, also noticed left lower extremity swelling, orthopnea.  Admitted for acute hypoxic respiratory failure, extremely elevated D-dimer, acute left lower extremity DVT, unable to do CTA chest due to IV contrast dye allergy.  Despite all aggressive measures, continued to do poorly, determined to have poor prognosis, transitioned to comfort care on 05/03/2020 and palliative care medicine was consulted.  However she still remains on some of her p.o. meds and on high flow nasal cannula oxygen and NRB oxygen (not to be weaned).  Rapidly declining now.   Assessment & Plan:  Active Problems:   Acute hypoxemic respiratory failure (HCC)   Palliative care by specialist   Goals of care, counseling/discussion   DNR (do not resuscitate)   Pressure injury of skin   Comfort measures only status   Pulmonary embolus ([place])   Acute respiratory failure with hypoxia, suspected due to COVID-19 related lung injury: Recently treated for severe COVID-19 pneumonia.  Per CT chest, severe parenchymal lung injury.  Despite aggressive treatment including IV Lasix, course of antibiotics, full dose anticoagulation for DVT and IV steroids, she continued to be severely hypoxic.  Her case was discussed with PCCM who reviewed the CT chest and agreed that patiently likely had incurred life-threatening and severe parenchymal lung injury which was most likely not reversible.  Prior hospitalist  MD discussed her grave prognosis with patient/patient's family, she was transitioned to DNR and transferred to palliative care unit on 05/03/2020.  Palliative care medicine continue to assist.    On 11/18, I discussed with Amanda Ross, Palliative Medicine PA-C and with Dr. Lane Hacker, Palliative Medicine Director.  Very difficult and painful situation in that, the patient is alert and oriented but remains dependent on high volume oxygen.  Dr. Hilma Favors and I agreed that it is reasonable to get PCCM to review case, options going forward regarding any further treatable options, measures to reduce oxygen needs so she can consider DC home or residential hospice which would not currently accept her with high oxygen needs versus ultimate option of terminal oxygen wean which will need to be done under some form of sedation.  PCCM, Dr. Michelle Piper input much appreciated.  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  Discussed with Amanda Ross, her input appreciated, she met with family 11/19, patient declining with worsening respiratory status-mild to moderately labored, getting fatigued, plan is to continue high flow oxygen without weaning (patient and family extremely anxious of weaning down), added scheduled opioids and benzodiazepines for pain and anxiety which seem to be helping.  It is felt that patient is declining and patient/family may not have to make the difficult decision of compassionate weaning.  Patient appeared comfortable today and sleeping, did not wake her up.  CAP versus acute bronchitis: Completed course of levofloxacin.  GERD: Continue PPI.  Elevated troponin: Likely due to demand ischemia from acute respiratory failure.  Low likelihood for ACS.  Echo showed preserved EF without wall  motion abnormalities.  Remains on metoprolol twice daily.  Will defer to PMT regarding discontinuing all medications nonessential for comfort.  Body mass index is 37.53 kg/m./Morbid obesity  Sjogren's syndrome: On Plaquenil PTA.  Currently on hold.  OSA  Dyslipidemia  Essential hypertension Controlled on metoprolol  Acute on chronic diastolic CHF Echo with LVEF 60%.  S/p IV Lasix.  Clinically euvolemic.  Developing pulmonary hypertension Noted on echo.  AKI:    DVT prophylaxis:   None.  Now on full comfort care.   Code Status: DNR Family Communication: Discussed in detail with patient's daughter and spouse at bedside.  Updated care and answered questions.  Comforted them both during this very difficult time. Disposition:  Status is: Inpatient  Remains inpatient appropriate because:Inpatient level of care appropriate due to severity of illness   Dispo: The patient is from: Home              Anticipated d/c is to: Anticipated hospital death.              Anticipated d/c date is: 3 days              Patient currently is not medically stable to d/c.        Consultants:   PCCM Palliative care medicine  Procedures:   None  Antimicrobials:   Completed and none at this time  Subjective:  Patient was sleeping and did not wake her up.  I discussed with patient's daughter and spouse at bedside and patient remained asleep throughout that time.  They reported that patient has been more comfortable with pain and dyspnea controlled after scheduled medicines as above were added.  Drinking Ensure and liquids but not eating much.  2 of patient's brother supposed to be coming today from out of town.  Objective:   Vitals:   05/05/20 1136 05/05/20 1414 05/06/20 0544 05/07/20 0535  BP:  122/75 (!) 141/90 123/83  Pulse: 90 86 90 90  Resp: 18  18   Temp: 98.7 F (37.1 C) 98.5 F (36.9 C) (!) 97.5 F (36.4 C) 99.6 F (37.6 C)  TempSrc: Axillary Axillary Oral Oral  SpO2:  (!) 87% (!) 85% 94% 94%  Weight:      Height:        General exam: Elderly female, moderately built and nourished, lying propped up in bed, looks better than she did yesterday, sleeping/resting peacefully without distress or discomfort.  I did not examine her beyond inspecting from bedside. Respiratory system: Was in no respiratory distress.  Still on nasal cannula and NRB oxygen.    Data Reviewed:   I have personally reviewed following labs and imaging studies   CBC: Recent Labs  Lab 05/01/20 0223 05/02/20 0328 05/03/20 0205  WBC 13.5* 16.4* 14.8*  NEUTROABS 12.2* 14.4* 12.6*  HGB 12.4 12.7 12.8  HCT 36.6 38.3 38.7  MCV 92.4 95.0 94.9  PLT 241 357 086    Basic Metabolic Panel: Recent Labs  Lab 05/01/20 0223 05/02/20 0328 05/03/20 0205  NA 140 140 143  K 4.3 4.5 4.5  CL 103 100 103  CO2 _0 GLUCOSE 123* 157* 124*  BUN 21 39* 50*  CREATININE 1.06* 1.32* 1.34*  CALCIUM 11.3* 12.0* 12.2*  MG 2.2 2.4 2.1    Liver Function Tests: Recent Labs  Lab 05/01/20 0223 05/02/20 0328  05/03/20 0205  AST 22 58* 41  ALT 25 75* 88*  ALKPHOS 99 107 98  BILITOT 1.1 1.0 0.6  PROT 6.1* 6.3* 6.2*  ALBUMIN 2.4* 2.4* 2.4*    CBG: No results for input(s): GLUCAP in the last 168 hours.  Microbiology Studies:   Recent Results (from the past 240 hour(s))  Culture, blood (routine x 2)     Status: None   Collection Time: 04/27/2020  5:12 PM   Specimen: BLOOD  Result Value Ref Range Status   Specimen Description BLOOD BLOOD LEFT WRIST  Final   Special Requests   Final    BOTTLES DRAWN AEROBIC AND ANAEROBIC Blood Culture results may not be optimal due to an inadequate volume of blood received in culture bottles   Culture   Final    NO GROWTH 5 DAYS Performed at [facility], [place] [address] [place], [place] [address]    Report Status 05/03/2020 FINAL  Final  Culture, blood (routine x 2)     Status: None   Collection Time: 05/10/2020  7:20 PM   Specimen: BLOOD    Result Value Ref Range Status   Specimen Description BLOOD RIGHT ANTECUBITAL  Final   Special Requests   Final    BOTTLES DRAWN AEROBIC ONLY Blood Culture results may not be optimal due to an inadequate volume of blood received in culture bottles   Culture   Final    NO GROWTH 5 DAYS Performed at [facility], [place] [address], [place], [place] [address]    Report Status 05/03/2020 FINAL  Final  MRSA PCR Screening     Status: None   Collection Time: 04/30/20 11:20 AM   Specimen: Nasal Mucosa; Nasopharyngeal  Result Value Ref Range Status   MRSA by PCR NEGATIVE NEGATIVE Final    Comment:        The GeneXpert MRSA Assay (FDA approved for NASAL specimens only), is one component of a comprehensive MRSA colonization surveillance program. It is not intended to diagnose MRSA infection nor to guide or monitor treatment for MRSA infections. Performed at [facility], [place] [address], [place], [place] [address]   Culture, Urine     Status: None   Collection Time: 04/30/20 11:26 AM   Specimen: Urine, Clean Catch  Result Value Ref Range Status   Specimen Description URINE, CLEAN CATCH  Final   Special Requests NONE  Final   Culture   Final    NO GROWTH Performed at [facility], [place] [address], [place], [place] [address]    Report Status 05/01/2020 FINAL  Final  Expectorated sputum assessment w rflx to resp cult     Status: None   Collection Time: 05/01/20  8:39 PM   Specimen: Sputum  Result Value Ref Range Status   Specimen Description SPUTUM  Final   Special Requests NONE  Final   Sputum evaluation   Final    THIS SPECIMEN IS ACCEPTABLE FOR SPUTUM CULTURE Performed at [facility], [place] [address] [place], [place] [address]    Report Status 05/02/2020 FINAL  Final  Culture, respiratory     Status: None   Collection Time: 05/01/20  8:39 PM   Specimen: SPU  Result Value Ref Range Status   Specimen Description SPUTUM  Final   Special Requests NONE  Reflexed from [id number]  Final   Gram Stain   Final    FEW WBC PRESENT, PREDOMINANTLY PMN MODERATE YEAST MODERATE GRAM  POSITIVE COCCI IN PAIRS IN CLUSTERS FEW GRAM NEGATIVE RODS Performed at [facility], [place] [address] [place], [place] [address]    Culture ABUNDANT CANDIDA ALBICANS  Final   Report Status 05/04/2020 FINAL  Final     Radiology Studies:  No results found.   Scheduled Meds:   . feeding supplement  237 mL Oral BID BM  .  HYDROmorphone (DILAUDID) injection  0.25 mg Intravenous Q6H  . LORazepam  0.25 mg Intravenous BID  . metoprolol tartrate  25 mg Oral BID  . [START ON 2020/06/05] pantoprazole  80 mg Oral Daily  . traZODone  100 mg Oral QHS    Continuous Infusions:     LOS: 9 days     Vernell Leep, MD, [place], [facility]    To contact the attending provider between 7A-7P or the covering provider during after hours 7P-7A, please log into the web site [email or web address] and access using universal [place] password for that web site. If you do not have the password, please call the hospital operator.  05/07/2020, 3:52 PM

## 2020-05-07 NOTE — Progress Notes (Signed)
Pt had an episode of restlessness this pm, prn ativan administered. Signs of further deterioration noted. Family remain at bedside

## 2020-05-07 NOTE — Progress Notes (Signed)
   Palliative Medicine Inpatient Follow Up Note   HPI: Per intake H&P -->Amanda Ross is a 74 y.o. female, with recent treated Covid 19 PNA >3 weeks ago, Sjogren's syndrome, hypertension, asthma, left thyroid nodule,diastolic heart failure. Shedischarged about 3-1/2 weeks ago after treatment of COVID-19 pneumonia now hasprogressive shortness of breath which started about a week after she went home. Identified to havesignificant parenchymal lung injuryd/t COVID-19 has a very poor prognosis.  Today's Discussion (05/07/2020): Chart reviewed.  I met with Amanda Ross and her daughter this morning.  Amanda Ross herself was more somnolent this morning.  Per her daughter she had started some around-the-clock medications to manage symptoms-she thought these were the contributing factors to why she was more somnolent.  We did talk about the death and dying process and how over time we do become more lethargic and less able to interact as various organs start shutting down.  She did seem to understand these concepts.  Patient's daughter was very clear that she and her father had had a discussion about not taking the patient off oxygen as this is Amanda Ross's wish.  In terms of advance care planning these documents had been requested and provided.  Patient's family has a notary that they are hopeful can come in today so that they can complete all of these documents.  From a symptom perspective patient appears comfortable.  No modifications of comfort medications are indicated at this time.  Provided  "Gone From My Site" booklet.  Questions and concerns addressed   Objective Assessment: Vital Signs Vitals:   05/06/20 0544 05/07/20 0535  BP: (!) 141/90 123/83  Pulse: 90 90  Resp: 18   Temp: (!) 97.5 F (36.4 C) 99.6 F (37.6 C)  SpO2: 94% 94%    Intake/Output Summary (Last 24 hours) at 05/07/2020 2248 Last data filed at 05/07/2020 0930 Gross per 24 hour  Intake 0 ml  Output 525 ml  Net -525  ml   Last Weight  Most recent update: 04/27/2020  3:31 PM   Weight  96.1 kg (211 lb 13.8 oz)           GNO:IBBCWUG AA F on [place] & NRBFM HEENT:drymucous membranes CV: Regular rate and rhythm  PULM:On [place] & NRBFM, tachypneic ABD: soft/nontender  EXT:(+) Generalized edema Neuro: Somnolent though dose arouse  SUMMARY OF RECOMMENDATIONS    Do not wean oxygen.  Continue low dose dilaudid q 4 hours.  In addition she has PRN dosing.  Continue low dose benzodiazepine bid.  Continuetrazodone at QHS to 100 mg.  Advance care planning - family will have private notary come in  Will follow supportively.  I believe Amanda Ross will unfortunately fatigue and her body will declare itself.  Then a wean will be appropriate for patient and family without the medical team having to schedule a compassionate wean.   Time Spent: 25 Greater than 50% of the time was spent in counseling and coordination of care ______________________________________________________________________________________ [place] Team Team Cell Phone: [phone] Please utilize secure chat with additional questions, if there is no response within 30 minutes please call the above phone number  Palliative Medicine Team providers are available by phone from 7am to 7pm daily and can be reached through the team cell phone.  Should this patient require assistance outside of these hours, please call the patient's attending physician.

## 2020-05-09 ENCOUNTER — Ambulatory Visit: Payer: Self-pay

## 2020-05-09 ENCOUNTER — Telehealth: Payer: Medicare Other

## 2020-05-09 NOTE — Chronic Care Management (AMB) (Signed)
  Chronic Care Management   Outreach Note  05/09/2020 Name: Amanda Ross MRN: [id number] DOB: 1945-08-23  Referred by: [name], MD Reason for referral : Care Coordination (case closure)   Collaboration with hospital liaison who reports patient was placed on comfort care and has passed away. SW performed case closure.   Daneen Schick, BSW, CDP Social Worker, Certified Dementia Practitioner [facility] [phone]

## 2020-05-16 ENCOUNTER — Telehealth: Payer: Medicare Other

## 2020-05-18 NOTE — Progress Notes (Signed)
Patient made comfortable in bed, period of restlesness noted. PRN Ativan and PRN pain med administered, it was effective. Patient was turned and repositioned. HOB remains elevated. Patients daughter at bedside.

## 2020-05-18 NOTE — Progress Notes (Signed)
Pt expired at 0725hrs. MD aware. Daughter was at bedside. Autopsy requested by family but pt does not meet criteria for hospital autopsy. Advised on how to go about an autopsy privately

## 2020-05-18 NOTE — Death Summary Note (Signed)
DEATH SUMMARY   Patient Details  Name: Amanda Ross MRN: [id number] DOB: Jan 15, 1946  Admission/Discharge Information   Admit Date:  2020/05/17  Date of Death: Date of Death: May 27, 2020  Time of Death: Time of Death: 0725  Length of Stay: 08/16/22  Referring Physician: [name], MD     Diagnoses  Preliminary cause of death:     Principle problem   Acute hypoxemic respiratory failure  Active Problems:   Palliative care by specialist   Goals of care, counseling/discussion   DNR (do not resuscitate)   Pressure injury of skin   Comfort measures only status   Pulmonary embolus [facility])   Brief Hospital Course (including significant findings, care, treatment, and services provided and events leading to death)  Amanda Ross is a 74 y.o. year old female recently treated for COVID-19 pneumonia >3 weeks prior to this admission, Sjogren's syndrome, hypertension, asthma, left thyroid nodule, presented to the ER with gradually progressive dyspnea that started about a week after she was discharged home, also noticed left lower extremity swelling, orthopnea.  Admitted for acute hypoxic respiratory failure, extremely elevated D-dimer, acute left lower extremity DVT, unable to do CTA chest due to IV contrast dye allergy.  Despite all aggressive measures, continued to do poorly, determined to have poor prognosis, transitioned to comfort care on 05/03/2020 and palliative care medicine was consulted.   She was transitioned to comfort care and expired this AM prior to me seeing her. Below is the last note from my colleague, Dr Algis Liming.  Per Dr Algis Liming:  On 11/18, I discussed with Ms. Dellinger, Palliative Medicine PA-C and with Dr. Lane Hacker, Palliative Medicine Director.  Very difficult and painful situation in that, the patient is alert and oriented but remains dependent on high volume oxygen.  Dr. Hilma Favors and I agreed that it is reasonable to get PCCM to review case, options going  forward regarding any further treatable options, measures to reduce oxygen needs so she can consider DC home or residential hospice which would not currently accept her with high oxygen needs versus ultimate option of terminal oxygen wean which will need to be done under some form of sedation.  PCCM, Dr. Michelle Piper input much appreciated.  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  Discussed with Ms. Anderson Malta, her input appreciated, she met with family 11/19, patient declining with worsening respiratory status-mild to moderately labored, getting fatigued, plan is to continue high flow oxygen without weaning (patient and family extremely anxious of weaning down), added scheduled opioids and benzodiazepines for pain and anxiety which seem to be helping.  It is felt that patient is declining and patient/family may not have to make the difficult decision of compassionate weaning.  Patient appeared comfortable today and sleeping, did not wake her up.  CAP versus acute bronchitis: Completed course of levofloxacin.  GERD: Continue PPI.  Elevated troponin: Likely due to demand ischemia from acute respiratory failure.  Low likelihood for ACS.  Echo showed preserved EF without wall motion abnormalities.  Remains on metoprolol twice daily.  Will defer to PMT regarding discontinuing all medications nonessential for comfort.  Body mass index is 37.53 kg/m./Morbid obesity  Sjogren's syndrome: On Plaquenil PTA.  Currently on hold.  OSA  Dyslipidemia  Essential hypertension Controlled on metoprolol  Acute on chronic diastolic CHF Echo with LVEF 60%.  S/p IV Lasix.  Clinically euvolemic.  Developing pulmonary hypertension Noted on echo.   Pertinent Labs and Studies  Significant Diagnostic  Studies CT CHEST WO CONTRAST  Result Date: 04/30/2020 CLINICAL DATA:  Chest pain, shortness of breath. Recently diagnosed with COVID in October EXAM: CT CHEST WITHOUT CONTRAST TECHNIQUE: Multidetector CT imaging of the chest was performed following the standard protocol without IV contrast. COMPARISON:  Prior CT scan of the chest 03/25/2020 FINDINGS: Cardiovascular: Limited evaluation in the absence of intravenous contrast. Tortuous and atherosclerotic thoracic aorta. The main pulmonary artery is enlarged at 3.6 cm. Mild cardiomegaly. Trace pericardial effusion. Atherosclerotic calcifications noted along the course of the left anterior descending coronary artery. Mediastinum/Nodes: Incidental note is made of a small nodule posterior the upper pole of the left thyroid gland. This measures less than 1.5 cm and therefore requires no further evaluation. Interval development of left AP window lymph nodes measuring up to 1.1 cm in short axis. Almost certainly reactive in nature given the relatively short time frame since the prior examination. Unremarkable esophagus. Lungs/Pleura: Severe interval progression of pulmonary parenchymal disease. Interval development of extensive bronchiectasis most severe in the right upper and lower lobes but also present in the periphery of the left upper lobe. Additionally, there is new emphysematous change in the right lung apex and extensive interstitial and confluent airspace opacities present throughout the portions of the lung affected with bronchiectatic changes. No pleural effusion. No pneumothorax. Upper Abdomen: No acute abnormality within the upper abdomen. Left renal and hepatic cysts again noted. Musculoskeletal: No acute fracture or aggressive appearing lytic or blastic osseous lesion. IMPRESSION: 1. Interval development of severe bilateral pulmonary parenchymal disease compared to the relatively recent prior imaging dated 03/25/2020. There is new significant  bronchiectasis throughout the posterior aspect of the right upper lobe, the right lower lobe and within the periphery of the left upper lobe. The associated pulmonary parenchyma is nearly completely replaced with interstitial and confluent airspace opacities. Findings are most consistent with severe post COVID scarring and architectural distortion. 2. New emphysematous changes in the right upper lobe near the apex. 3. No evidence of pleural effusion or pneumothorax. 4. Increasing prominence of mediastinal lymph nodes are almost certainly reactive in nature. 5. Enlarged main pulmonary artery suggests developing pulmonary arterial hypertension. 6. Coronary artery and aortic atherosclerotic calcifications. Aortic Atherosclerosis (ICD10-I70.0) and Emphysema (ICD10-J43.9). Electronically Signed   By: Jacqulynn Cadet M.D.   On: 04/30/2020 11:13   CARDIAC CATHETERIZATION  Result Date: 04/15/2020 Medical treatment for possible microvascular disease or demand ischemia. Low dose aspirin as tolerated.  NM Pulmonary Perf and Vent  Result Date: 04/29/2020 CLINICAL DATA:  74 year old female discharged from the hospital 3.5 weeks ago after being treated for COVID-19. Pneumonia gradual progressive shortness of breath, lower extremity swelling. EXAM: NUCLEAR MEDICINE PERFUSION LUNG SCAN TECHNIQUE: Perfusion images were obtained in multiple projections after intravenous injection of radiopharmaceutical. Ventilation scans intentionally deferred if perfusion scan and chest x-ray adequate for interpretation during COVID 19 epidemic. RADIOPHARMACEUTICALS:  4.4 mCi Tc-89mMAA IV COMPARISON:  Portable chest radiograph yesterday. FINDINGS: Generalized mildly to moderately decreased perfusion radiotracer in the right lung, corresponding to asymmetric coarse interstitial opacity on that side yesterday. Comparatively normal perfusion radiotracer activity on the left. No wedge-shaped or suspicious perfusion defect identified.  IMPRESSION: 1. No strong evidence of acute pulmonary embolus. 2. Generalized decreased perfusion in the right lung which demonstrated widespread abnormal opacity on portable chest x-ray yesterday. Electronically Signed   By: HHerminio HeadsD.  On: 04/29/2020 15:33   DG Chest Port 1 View  Result Date: 05/03/2020 CLINICAL DATA:  Shortness of breath.  History of CHF. EXAM: PORTABLE CHEST 1 VIEW COMPARISON:  CT chest April 30, 2020. Chest radiograph April 28, 2020. FINDINGS: Bilateral peripheral predominant interstitial and airspace opacities. No visible pleural effusions or pneumothorax. Similar enlarged cardiomediastinal silhouette. Aortic atherosclerosis. No acute osseous abnormality. IMPRESSION: Bilateral peripheral prominent interstitial and airspace opacities. Findings most likely represent multifocal pneumonia and/or post infectious scarring, although an element of pulmonary edema is difficult to exclude. Please see recent CT chest for further characterization. Electronically Signed   By: Margaretha Sheffield MD   On: 05/03/2020 07:54   DG Chest Port 1 View  Result Date: 04/23/2020 CLINICAL DATA:  Shortness of breath EXAM: PORTABLE CHEST 1 VIEW COMPARISON:  04/03/2020, 03/31/2020 CT 03/25/2020 FINDINGS: Significant interval worsening right greater than left pulmonary airspace opacities concerning for bilateral pneumonia. Enlarged cardiomediastinal silhouette with aortic atherosclerosis. No significant effusion. No pneumothorax. IMPRESSION: Significant interval worsening of right greater than left pulmonary airspace opacities concerning for bilateral pneumonia. Electronically Signed   By: Donavan Foil M.D.   On: 04/27/2020 17:03   ECHOCARDIOGRAM COMPLETE  Result Date: 04/29/2020    ECHOCARDIOGRAM REPORT   Patient Name:   DOMINICA KENT Date of Exam: 04/29/2020 Medical Rec #:  [id number]         Height:       63.0 in Accession #:    [id number]        Weight:       211.9 lb Date of Birth:  07-27-1945          BSA:          1.982 m Patient Age:    74 years          BP:           119/72 mmHg Patient Gender: F                 HR:           103 bpm. Exam Location:  Inpatient Procedure: 2D Echo, Color Doppler, Cardiac Doppler and Intracardiac            Opacification Agent Indications:    Q46.96 Acute diastolic (congestive) heart failure  History:        Patient has prior history of Echocardiogram examinations, most                 recent 04/06/2020. Risk Factors:Hypertension, Dyslipidemia,                 Sleep Apnea and COVID+ 03/24/20.  Sonographer:    Raquel Sarna Senior RDCS Referring Phys: Graylin Shiver [facility]  Sonographer Comments: Technically difficult due to patient body habitus. IMPRESSIONS  1. Left ventricular ejection fraction, by estimation, is 60 to 65%. The left ventricle has normal function. The left ventricle has no regional wall motion abnormalities. There is mild left ventricular hypertrophy. Left ventricular diastolic parameters are consistent with Grade I diastolic dysfunction (impaired relaxation).  2. Right ventricular systolic function is moderately reduced. The right ventricular size is mildly enlarged. With RV free wall hypokinesis and preserved apical function, would consider PE. There is normal pulmonary artery systolic pressure. The estimated right ventricular systolic pressure is 29.5 mmHg.  3. The mitral valve is normal in structure. No evidence of mitral valve regurgitation. No evidence of mitral stenosis.  4. The aortic valve is tricuspid. Aortic valve regurgitation is not visualized. No  aortic stenosis is present.  5. Aortic dilatation noted. There is mild dilatation of the ascending aorta, measuring 38 mm.  6. The inferior vena cava is normal in size with <50% respiratory variability, suggesting right atrial pressure of 8 mmHg.  7. 5 cm x 5 cm cystic structure in liver. FINDINGS  Left Ventricle: Left ventricular ejection fraction, by estimation, is 60 to 65%. The left ventricle has normal  function. The left ventricle has no regional wall motion abnormalities. Definity contrast agent was given IV to delineate the left ventricular  endocardial borders. The left ventricular internal cavity size was normal in size. There is mild left ventricular hypertrophy. Left ventricular diastolic parameters are consistent with Grade I diastolic dysfunction (impaired relaxation). Right Ventricle: The right ventricular size is mildly enlarged. No increase in right ventricular wall thickness. Right ventricular systolic function is moderately reduced. There is normal pulmonary artery systolic pressure. The tricuspid regurgitant velocity is 2.52 m/s, and with an assumed right atrial pressure of 8 mmHg, the estimated right ventricular systolic pressure is 70.3 mmHg. Left Atrium: Left atrial size was normal in size. Right Atrium: Right atrial size was normal in size. Pericardium: There is no evidence of pericardial effusion. Mitral Valve: The mitral valve is normal in structure. No evidence of mitral valve regurgitation. No evidence of mitral valve stenosis. Tricuspid Valve: The tricuspid valve is normal in structure. Tricuspid valve regurgitation is trivial. Aortic Valve: The aortic valve is tricuspid. Aortic valve regurgitation is not visualized. No aortic stenosis is present. Pulmonic Valve: The pulmonic valve was not well visualized. Pulmonic valve regurgitation is not visualized. Aorta: Aortic dilatation noted. There is mild dilatation of the ascending aorta, measuring 38 mm. Venous: The inferior vena cava is normal in size with less than 50% respiratory variability, suggesting right atrial pressure of 8 mmHg. IAS/Shunts: No atrial level shunt detected by color flow Doppler.  LEFT VENTRICLE PLAX 2D LVIDd:         3.20 cm  Diastology LVIDs:         1.70 cm  LV e' medial:    4.68 cm/s LV PW:         1.20 cm  LV E/e' medial:  10.2 LV IVS:        1.40 cm  LV e' lateral:   4.35 cm/s LVOT diam:     2.00 cm  LV E/e' lateral:  10.9 LV SV:         52 LV SV Index:   26 LVOT Area:     3.14 cm  RIGHT VENTRICLE RV S prime:     12.60 cm/s TAPSE (M-mode): 1.9 cm LEFT ATRIUM             Index       RIGHT ATRIUM           Index LA diam:        3.20 cm 1.61 cm/m  RA Area:     19.10 cm LA Vol (A2C):   47.4 ml 23.92 ml/m RA Volume:   49.10 ml  24.78 ml/m LA Vol (A4C):   43.1 ml 21.75 ml/m LA Biplane Vol: 45.9 ml 23.16 ml/m  AORTIC VALVE LVOT Vmax:   112.00 cm/s LVOT Vmean:  75.200 cm/s LVOT VTI:    0.167 m  AORTA Ao Root diam: 3.20 cm Ao Asc diam:  3.80 cm MITRAL VALVE               TRICUSPID VALVE MV Area (PHT): 3.76 cm  TR Peak grad:   25.4 mmHg MV Decel Time: 202 msec    TR Vmax:        252.00 cm/s MV E velocity: 47.60 cm/s MV A velocity: 92.10 cm/s  SHUNTS MV E/A ratio:  0.52        Systemic VTI:  0.17 m                            Systemic Diam: 2.00 cm Loralie Champagne MD Electronically signed by Loralie Champagne MD Signature Date/Time: 04/29/2020/4:25:58 PM    Final    VAS [place] LOWER EXTREMITY VENOUS (DVT) (ONLY [facility] & [facility])  Result Date: 04/30/2020  Lower Venous DVT Study Indications: Recent COVID infection 03/2020, now with D-dimer >20 and hypoxia.  Limitations: Poor ultrasound/tissue interface. Comparison Study: 03/26/2020 negative lower extremity venous duplex Performing Technologist: Maudry Mayhew MHA, RDMS, RVT, RDCS  Examination Guidelines: A complete evaluation includes B-mode imaging, spectral Doppler, color Doppler, and power Doppler as needed of all accessible portions of each vessel. Bilateral testing is considered an integral part of a complete examination. Limited examinations for reoccurring indications may be performed as noted. The reflux portion of the exam is performed with the patient in reverse Trendelenburg.  +---------+---------------+---------+-----------+----------+--------------+ RIGHT    CompressibilityPhasicitySpontaneityPropertiesThrombus Aging  +---------+---------------+---------+-----------+----------+--------------+ CFV      Full           No       Yes                                 +---------+---------------+---------+-----------+----------+--------------+ SFJ      Full                                                        +---------+---------------+---------+-----------+----------+--------------+ FV Prox  Full                                                        +---------+---------------+---------+-----------+----------+--------------+ FV Mid   Full                                                        +---------+---------------+---------+-----------+----------+--------------+ FV DistalFull                                                        +---------+---------------+---------+-----------+----------+--------------+ PFV      Full                                                        +---------+---------------+---------+-----------+----------+--------------+ POP      Full  No       Yes                                 +---------+---------------+---------+-----------+----------+--------------+ PTV      Full                                                        +---------+---------------+---------+-----------+----------+--------------+ PERO     Full                                                        +---------+---------------+---------+-----------+----------+--------------+   +---------+---------------+---------+-----------+----------+--------------+ LEFT     CompressibilityPhasicitySpontaneityPropertiesThrombus Aging +---------+---------------+---------+-----------+----------+--------------+ CFV      Full           No       Yes                                 +---------+---------------+---------+-----------+----------+--------------+ SFJ      Full                                                         +---------+---------------+---------+-----------+----------+--------------+ FV Prox  None                                         Acute          +---------+---------------+---------+-----------+----------+--------------+ FV Mid   None                                         Acute          +---------+---------------+---------+-----------+----------+--------------+ FV DistalNone                                         Acute          +---------+---------------+---------+-----------+----------+--------------+ PFV      Full                                                        +---------+---------------+---------+-----------+----------+--------------+ POP      None                    No                   Acute          +---------+---------------+---------+-----------+----------+--------------+ PTV      None  Acute          +---------+---------------+---------+-----------+----------+--------------+   Left Technical Findings: Not visualized segments include peroneal veins not adequately visualized.   Summary: RIGHT: - There is no evidence of deep vein thrombosis in the lower extremity.  - No cystic structure found in the popliteal fossa.  LEFT: - Findings consistent with acute deep vein thrombosis involving the left femoral vein, left popliteal vein, and left posterior tibial veins. - No cystic structure found in the popliteal fossa.  Lower extremity venous flow is pulsatile, suggestive of possibly elevated right heart pressure.  *See table(s) above for measurements and observations. Electronically signed by Ruta Hinds MD on 04/30/2020 at 11:45:06 AM.    Final     Microbiology Recent Results (from the past 240 hour(s))  Culture, blood (routine x 2)     Status: None   Collection Time: 05/06/2020  5:12 PM   Specimen: BLOOD  Result Value Ref Range Status   Specimen Description BLOOD BLOOD LEFT WRIST  Final   Special Requests   Final     BOTTLES DRAWN AEROBIC AND ANAEROBIC Blood Culture results may not be optimal due to an inadequate volume of blood received in culture bottles   Culture   Final    NO GROWTH 5 DAYS Performed at [facility], [place] [address], [place], [place] [address]    Report Status 05/03/2020 FINAL  Final  Culture, blood (routine x 2)     Status: None   Collection Time: 04/20/2020  7:20 PM   Specimen: BLOOD  Result Value Ref Range Status   Specimen Description BLOOD RIGHT ANTECUBITAL  Final   Special Requests   Final    BOTTLES DRAWN AEROBIC ONLY Blood Culture results may not be optimal due to an inadequate volume of blood received in culture bottles   Culture   Final    NO GROWTH 5 DAYS Performed at [facility], [place] [address], [place], [place] [address]    Report Status 05/03/2020 FINAL  Final  MRSA PCR Screening     Status: None   Collection Time: 04/30/20 11:20 AM   Specimen: Nasal Mucosa; Nasopharyngeal  Result Value Ref Range Status   MRSA by PCR NEGATIVE NEGATIVE Final    Comment:        The GeneXpert MRSA Assay (FDA approved for NASAL specimens only), is one component of a comprehensive MRSA colonization surveillance program. It is not intended to diagnose MRSA infection nor to guide or monitor treatment for MRSA infections. Performed at [facility], [place] [address] [place], [place] [address]   Culture, Urine     Status: None   Collection Time: 04/30/20 11:26 AM   Specimen: Urine, Clean Catch  Result Value Ref Range Status   Specimen Description URINE, CLEAN CATCH  Final   Special Requests NONE  Final   Culture   Final    NO GROWTH Performed at [facility], [place] [address] [place], [place] [address]    Report Status 05/01/2020 FINAL  Final  Expectorated sputum assessment w rflx to resp cult     Status: None   Collection Time: 05/01/20  8:39 PM   Specimen: Sputum  Result Value Ref Range Status   Specimen Description SPUTUM  Final   Special  Requests NONE  Final   Sputum evaluation   Final    THIS SPECIMEN IS ACCEPTABLE FOR SPUTUM CULTURE Performed at [facility], [place],  [place] [address]    Report Status 05/02/2020 FINAL  Final  Culture, respiratory     Status: None   Collection Time: 05/01/20  8:39 PM   Specimen: SPU  Result Value Ref Range Status   Specimen Description SPUTUM  Final   Special Requests NONE Reflexed from [id number]  Final   Gram Stain   Final    FEW WBC PRESENT, PREDOMINANTLY PMN MODERATE YEAST MODERATE GRAM POSITIVE COCCI IN PAIRS IN CLUSTERS FEW GRAM NEGATIVE RODS Performed at [facility], [place] [address] [place], [place] [address]    Culture ABUNDANT CANDIDA ALBICANS  Final   Report Status 05/04/2020 FINAL  Final    Lab Basic Metabolic Panel: Recent Labs  Lab 05/02/20 0328 05/03/20 0205  NA 140 143  K 4.5 4.5  CL 100 103  CO2 29 30  GLUCOSE 157* 124*  BUN 39* 50*  CREATININE 1.32* 1.34*  CALCIUM 12.0* 12.2*  MG 2.4 2.1   Liver Function Tests: Recent Labs  Lab 05/02/20 0328 05/03/20 0205  AST 58* 41  ALT 75* 88*  ALKPHOS 107 98  BILITOT 1.0 0.6  PROT 6.3* 6.2*  ALBUMIN 2.4* 2.4*   No results for input(s): LIPASE, AMYLASE in the last 168 hours. No results for input(s): AMMONIA in the last 168 hours. CBC: Recent Labs  Lab 05/02/20 0328 05/03/20 0205  WBC 16.4* 14.8*  NEUTROABS 14.4* 12.6*  HGB 12.7 12.8  HCT 38.3 38.7  MCV 95.0 94.9  PLT 357 369   Cardiac Enzymes: No results for input(s): CKTOTAL, CKMB, CKMBINDEX, TROPONINI in the last 168 hours. Sepsis Labs: Recent Labs  Lab 05/02/20 0328 05/02/20 1100 05/03/20 0205  PROCALCITON  --  0.45 0.37  WBC 16.4*  --  14.8*        Jadyn Barge 05-13-20, 11:33 AM

## 2020-05-18 DEATH — deceased

## 2020-06-07 ENCOUNTER — Ambulatory Visit: Payer: Medicare Other | Admitting: Nurse Practitioner

## 2020-06-17 ENCOUNTER — Encounter: Payer: Self-pay | Admitting: Nurse Practitioner

## 2021-03-02 ENCOUNTER — Ambulatory Visit: Payer: Medicare Other | Admitting: Internal Medicine

## 2022-02-02 IMAGING — CT CT ANGIO CHEST
2 of 6 series · 18 of 46 positions shown · IV contrast (omnipaque)
Comparison: None.

CLINICAL DATA: Shortness of breath, covid positive

EXAM:
CT ANGIOGRAPHY CHEST WITH CONTRAST
TECHNIQUE: Multidetector CT imaging of the chest was performed using the
standard protocol during bolus administration of intravenous
contrast. Multiplanar CT image reconstructions and MIPs were
obtained to evaluate the vascular anatomy.
CONTRAST:  100mL OMNIPAQUE IOHEXOL 350 MG/ML SOLN

[Series 6: thins · axial · 0.98mm/px · z∈[+1082,+1356]mm · 15 of 302 slices shown]
[im 14/302  lung]
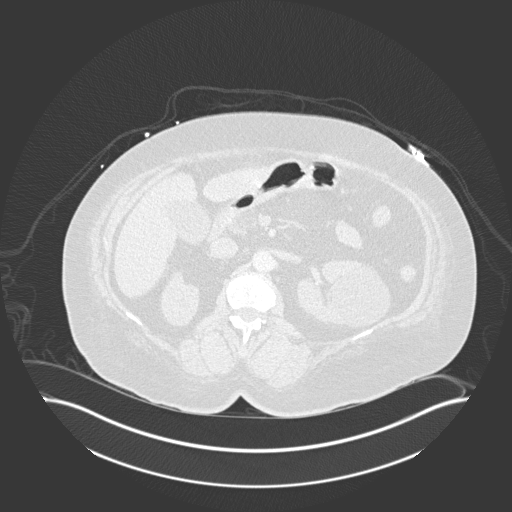
[im 40/302  soft-tissue]
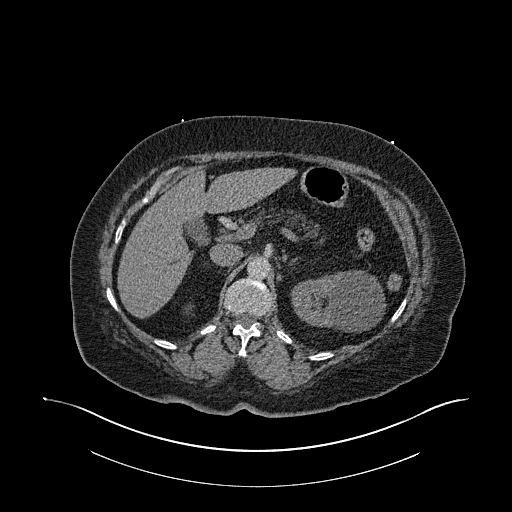
[im 53/302  lung]
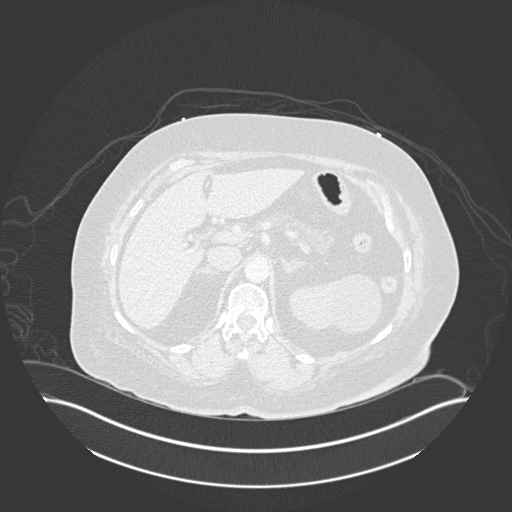
[im 79/302  soft-tissue]
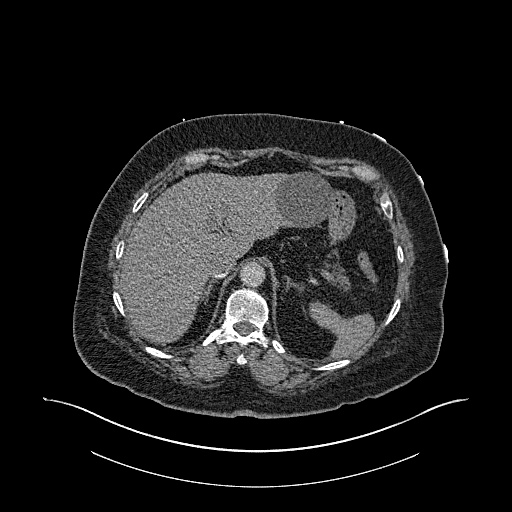
[im 92/302  lung]
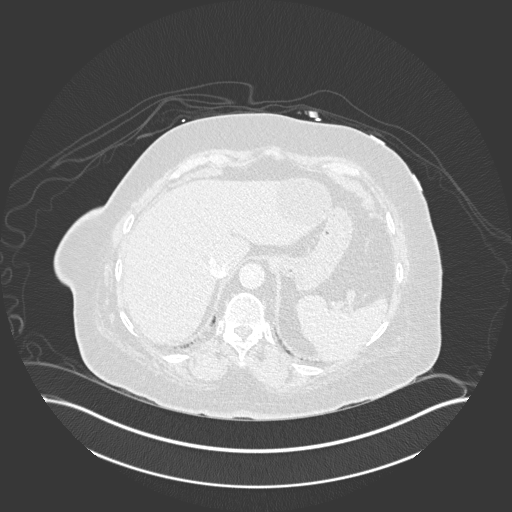
[im 118/302  soft-tissue]
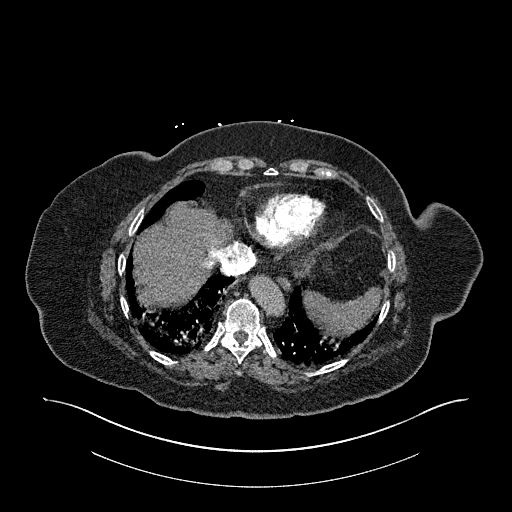
[im 131/302  lung]
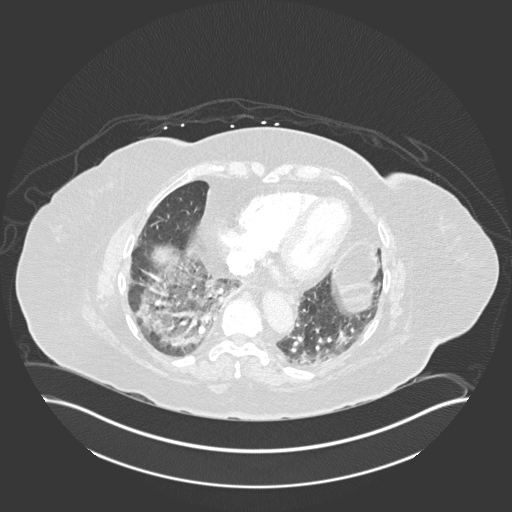
[im 158/302  soft-tissue]
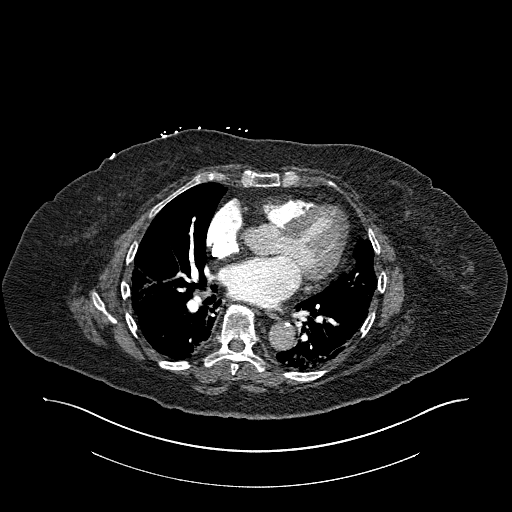
[im 171/302  lung]
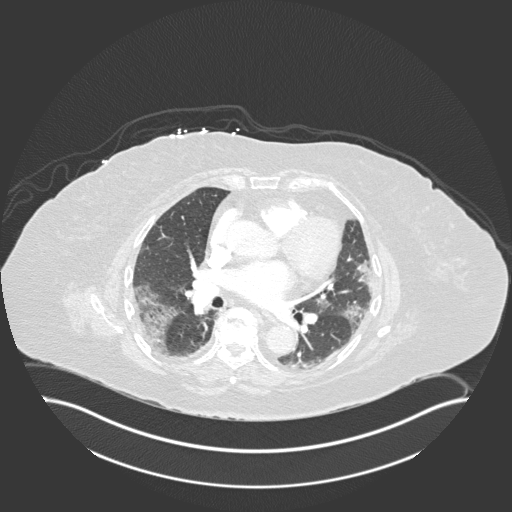
[im 184/302  soft-tissue]
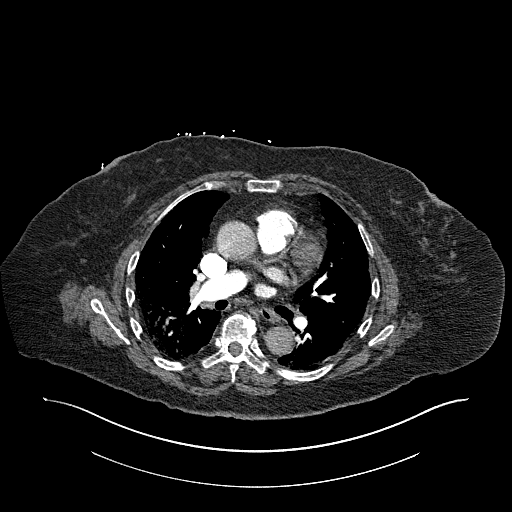
[im 210/302  lung]
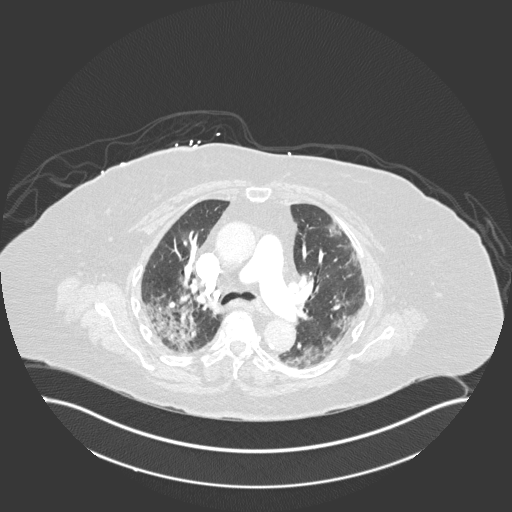
[im 223/302  soft-tissue]
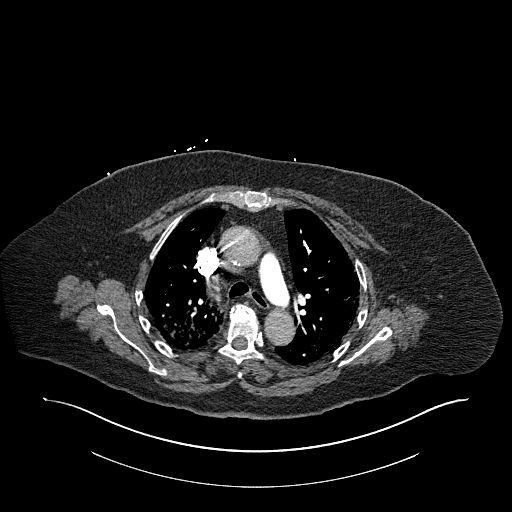
[im 249/302  lung]
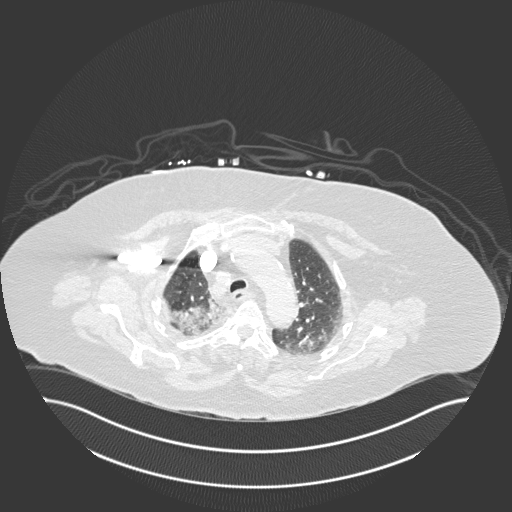
[im 262/302  soft-tissue]
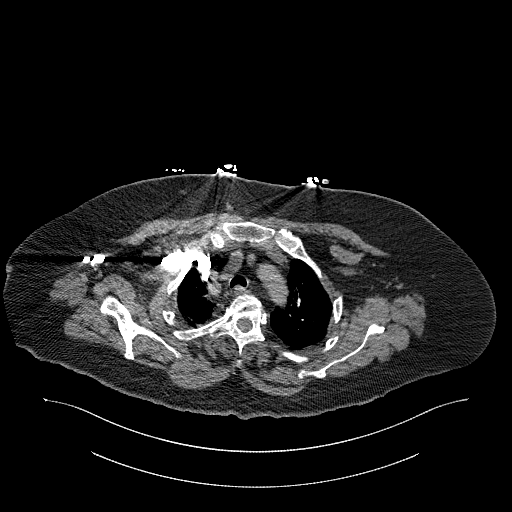
[im 288/302  lung]
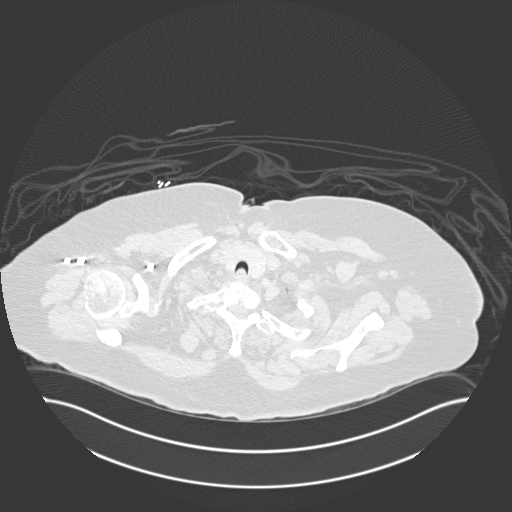

[Series 8: coronal mpr · coronal · 0.59mm/px · 3 of 151 slices shown]
[im 38/151  soft-tissue]
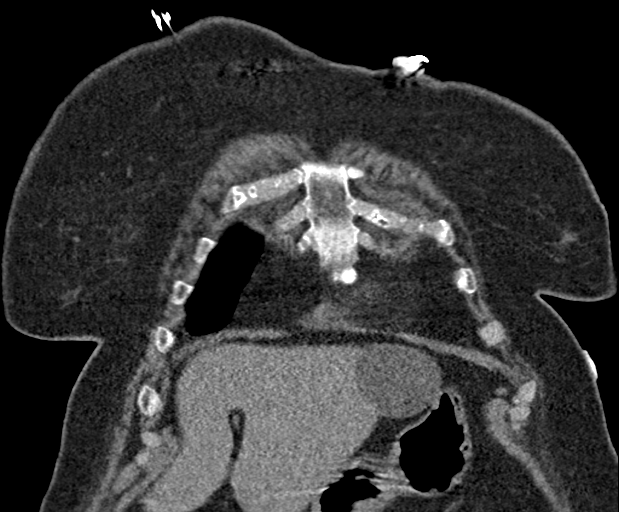
[im 76/151  soft-tissue]
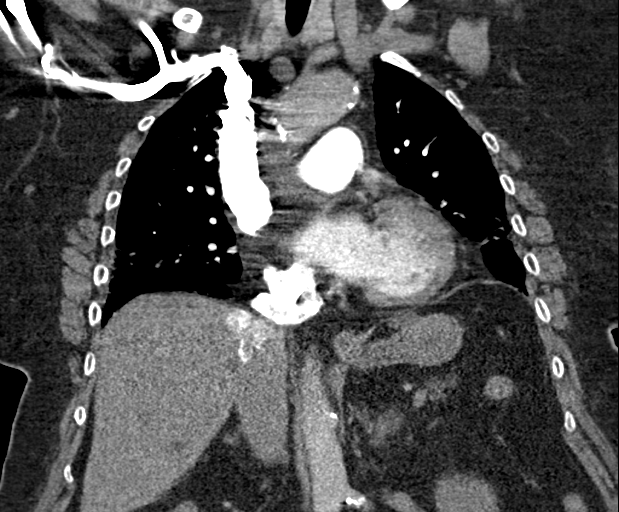
[im 113/151  soft-tissue]
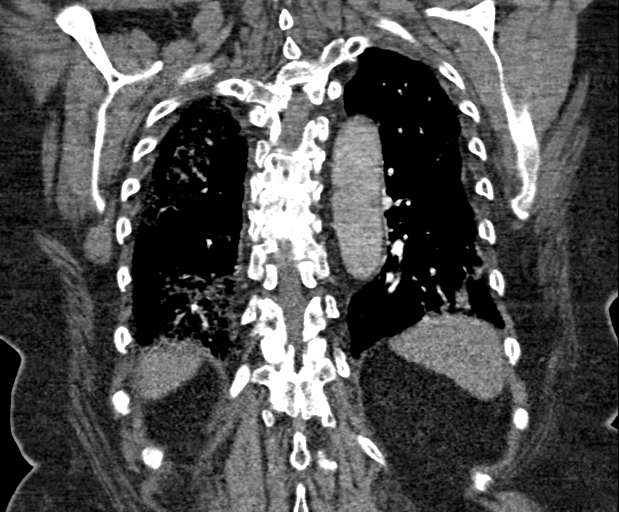

[18 of 46 positions shown; findings below may reference images not displayed]

FINDINGS: Cardiovascular: There is a optimal opacification of the pulmonary
arteries. There is no central,segmental, or subsegmental filling
defects within the pulmonary arteries. The heart is normal in size.
No pericardial effusion or thickening. No evidence right heart
strain. There is normal three-vessel brachiocephalic anatomy without
proximal stenosis. Scattered aortic atherosclerosis is noted.

Mediastinum/Nodes: No hilar, mediastinal, or axillary adenopathy.
Thyroid gland, trachea, and esophagus demonstrate no significant
findings.

Lungs/Pleura: Multifocal patchy airspace opacities are seen
throughout both lungs, right greater than left. No pleural effusion
or pneumothorax.

Upper Abdomen: No acute abnormalities present in the visualized
portions of the upper abdomen. Low-density lesions are seen within
the left kidney and anterior left hepatic lobe.

Musculoskeletal: No chest wall abnormality. No acute or significant
osseous findings.

Review of the MIP images confirms the above findings.
IMPRESSION: No central, segmental, or subsegmental pulmonary embolism.

Multifocal patchy airspace opacities, right greater than left,
consistent with multifocal viral pneumonia

## 2022-02-08 IMAGING — DX DG CHEST 1V PORT
1 series · 1 of 1 positions shown · non-contrast
Comparison: 03/28/2020

CLINICAL DATA: Shortness of breath.

EXAM:
PORTABLE CHEST 1 VIEW

[chest]
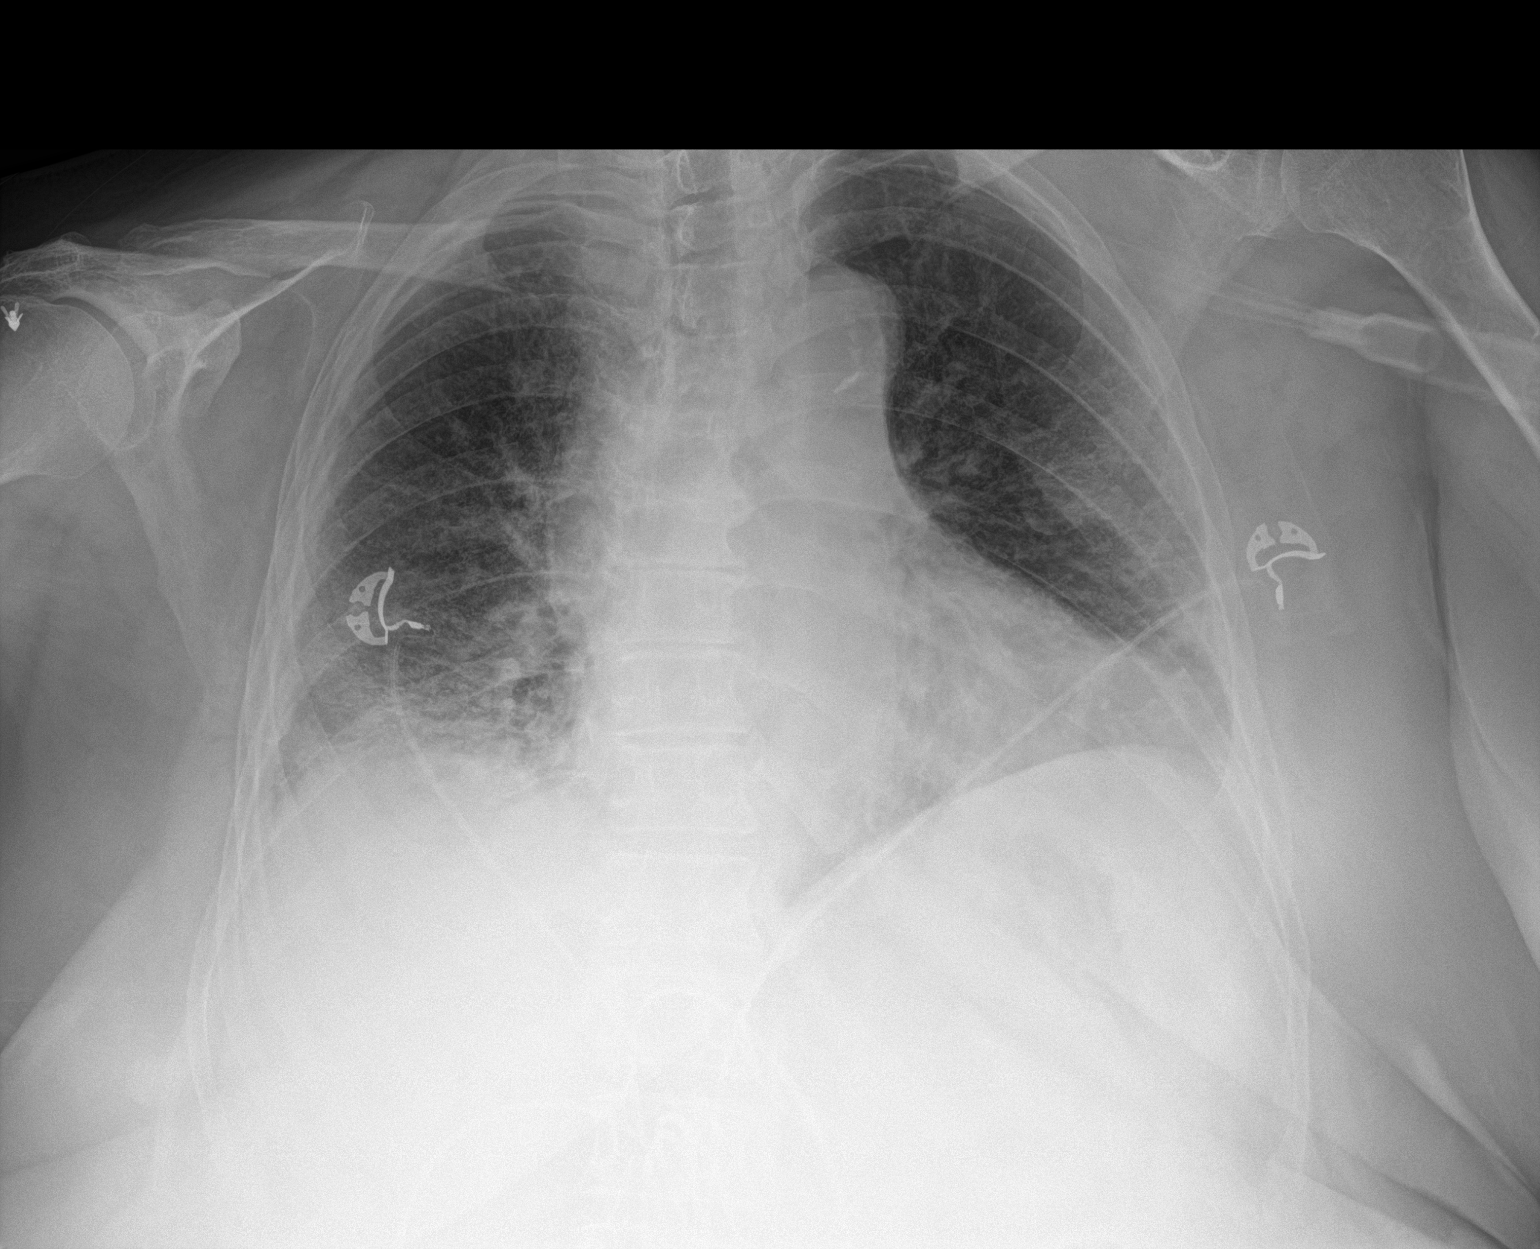

[1 of 1 positions shown; findings below may reference images not displayed]

FINDINGS: Mild cardiomegaly stable. Aortic atherosclerotic calcification
noted. Pulmonary infiltrate is again seen at the right base, with
mild worsening since previous study. There is also mild airspace
disease in the peripheral left mid lung which is unchanged in
appearance. No definite pleural effusion or pneumothorax.
IMPRESSION: Mild worsening of right basilar pulmonary infiltrate.

Stable mild airspace disease in peripheral left mid lung.
